# Patient Record
Sex: Female | Born: 1935 | Race: White | Hispanic: No | Smoking: Former smoker
Health system: Southern US, Community
[De-identification: ages and names within clinical notes are randomized; demographics above are authoritative.]

## PROBLEM LIST (undated history)

## (undated) DIAGNOSIS — G629 Polyneuropathy, unspecified: Secondary | ICD-10-CM

## (undated) DIAGNOSIS — K2270 Barrett's esophagus without dysplasia: Secondary | ICD-10-CM

## (undated) DIAGNOSIS — I2510 Atherosclerotic heart disease of native coronary artery without angina pectoris: Secondary | ICD-10-CM

## (undated) DIAGNOSIS — W010XXA Fall on same level from slipping, tripping and stumbling without subsequent striking against object, initial encounter: Secondary | ICD-10-CM

## (undated) DIAGNOSIS — E785 Hyperlipidemia, unspecified: Secondary | ICD-10-CM

## (undated) DIAGNOSIS — I209 Angina pectoris, unspecified: Secondary | ICD-10-CM

## (undated) DIAGNOSIS — Z8673 Personal history of transient ischemic attack (TIA), and cerebral infarction without residual deficits: Secondary | ICD-10-CM

## (undated) DIAGNOSIS — I82519 Chronic embolism and thrombosis of unspecified femoral vein: Secondary | ICD-10-CM

## (undated) DIAGNOSIS — M1990 Unspecified osteoarthritis, unspecified site: Secondary | ICD-10-CM

## (undated) DIAGNOSIS — M25511 Pain in right shoulder: Secondary | ICD-10-CM

## (undated) DIAGNOSIS — R600 Localized edema: Secondary | ICD-10-CM

## (undated) DIAGNOSIS — I10 Essential (primary) hypertension: Secondary | ICD-10-CM

## (undated) DIAGNOSIS — M810 Age-related osteoporosis without current pathological fracture: Secondary | ICD-10-CM

## (undated) DIAGNOSIS — Z9289 Personal history of other medical treatment: Secondary | ICD-10-CM

## (undated) DIAGNOSIS — Z9861 Coronary angioplasty status: Secondary | ICD-10-CM

## (undated) DIAGNOSIS — Z8669 Personal history of other diseases of the nervous system and sense organs: Secondary | ICD-10-CM

## (undated) DIAGNOSIS — M069 Rheumatoid arthritis, unspecified: Secondary | ICD-10-CM

## (undated) DIAGNOSIS — S72009A Fracture of unspecified part of neck of unspecified femur, initial encounter for closed fracture: Secondary | ICD-10-CM

## (undated) DIAGNOSIS — R0602 Shortness of breath: Secondary | ICD-10-CM

## (undated) DIAGNOSIS — I739 Peripheral vascular disease, unspecified: Secondary | ICD-10-CM

## (undated) DIAGNOSIS — M160 Bilateral primary osteoarthritis of hip: Secondary | ICD-10-CM

## (undated) DIAGNOSIS — J449 Chronic obstructive pulmonary disease, unspecified: Secondary | ICD-10-CM

## (undated) DIAGNOSIS — K449 Diaphragmatic hernia without obstruction or gangrene: Secondary | ICD-10-CM

## (undated) DIAGNOSIS — J189 Pneumonia, unspecified organism: Secondary | ICD-10-CM

## (undated) DIAGNOSIS — I700 Atherosclerosis of aorta: Secondary | ICD-10-CM

## (undated) DIAGNOSIS — D509 Iron deficiency anemia, unspecified: Secondary | ICD-10-CM

## (undated) DIAGNOSIS — K219 Gastro-esophageal reflux disease without esophagitis: Secondary | ICD-10-CM

## (undated) HISTORY — DX: Atherosclerosis of aorta: I70.0

## (undated) HISTORY — DX: Peripheral vascular disease, unspecified: I73.9

## (undated) HISTORY — DX: Bilateral primary osteoarthritis of hip: M16.0

## (undated) HISTORY — DX: Personal history of transient ischemic attack (TIA), and cerebral infarction without residual deficits: Z86.73

## (undated) HISTORY — PX: JOINT REPLACEMENT: SHX530

## (undated) HISTORY — DX: Hyperlipidemia, unspecified: E78.5

## (undated) HISTORY — DX: Gastro-esophageal reflux disease without esophagitis: K21.9

## (undated) HISTORY — DX: Atherosclerotic heart disease of native coronary artery without angina pectoris: I25.10

## (undated) HISTORY — DX: Fracture of unspecified part of neck of unspecified femur, initial encounter for closed fracture: S72.009A

## (undated) HISTORY — DX: Chronic embolism and thrombosis of unspecified femoral vein: I82.519

## (undated) HISTORY — DX: Localized edema: R60.0

## (undated) HISTORY — PX: OTHER SURGICAL HISTORY: SHX169

## (undated) HISTORY — DX: Iron deficiency anemia, unspecified: D50.9

## (undated) HISTORY — DX: Polyneuropathy, unspecified: G62.9

## (undated) HISTORY — DX: Personal history of other diseases of the nervous system and sense organs: Z86.69

## (undated) HISTORY — DX: Pneumonia, unspecified organism: J18.9

## (undated) HISTORY — DX: Unspecified osteoarthritis, unspecified site: M19.90

## (undated) HISTORY — PX: UPPER GASTROINTESTINAL ENDOSCOPY: SHX188

## (undated) HISTORY — DX: Chronic obstructive pulmonary disease, unspecified: J44.9

## (undated) HISTORY — DX: Pain in right shoulder: M25.511

## (undated) HISTORY — DX: Age-related osteoporosis without current pathological fracture: M81.0

## (undated) HISTORY — DX: Barrett's esophagus without dysplasia: K22.70

## (undated) HISTORY — DX: Coronary angioplasty status: Z98.61

## (undated) HISTORY — PX: COLONOSCOPY: SHX5424

## (undated) HISTORY — PX: CATARACT EXTRACTION W/ INTRAOCULAR LENS  IMPLANT, BILATERAL: SHX1307

## (undated) HISTORY — PX: SHOULDER OPEN ROTATOR CUFF REPAIR: SHX2407

## (undated) HISTORY — PX: CHOLECYSTECTOMY: SHX55

## (undated) HISTORY — DX: Diaphragmatic hernia without obstruction or gangrene: K44.9

## (undated) HISTORY — DX: Essential (primary) hypertension: I10

## (undated) HISTORY — PX: BACK SURGERY: SHX140

---
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
## 1963-02-21 HISTORY — PX: TOTAL ABDOMINAL HYSTERECTOMY: SHX209

## 1992-02-21 DIAGNOSIS — I739 Peripheral vascular disease, unspecified: Secondary | ICD-10-CM

## 1992-02-21 HISTORY — PX: FEMORAL-FEMORAL BYPASS GRAFT: SHX936

## 1992-02-21 HISTORY — DX: Peripheral vascular disease, unspecified: I73.9

## 1997-07-20 ENCOUNTER — Ambulatory Visit (HOSPITAL_COMMUNITY): Admission: RE | Admit: 1997-07-20 | Discharge: 1997-07-20 | Payer: Self-pay | Admitting: Vascular Surgery

## 1997-11-24 ENCOUNTER — Ambulatory Visit (HOSPITAL_COMMUNITY): Admission: RE | Admit: 1997-11-24 | Discharge: 1997-11-24 | Payer: Self-pay | Admitting: Obstetrics & Gynecology

## 1998-05-06 ENCOUNTER — Ambulatory Visit (HOSPITAL_BASED_OUTPATIENT_CLINIC_OR_DEPARTMENT_OTHER): Admission: RE | Admit: 1998-05-06 | Discharge: 1998-05-06 | Payer: Self-pay | Admitting: Orthopedic Surgery

## 1998-05-14 ENCOUNTER — Encounter: Admission: RE | Admit: 1998-05-14 | Discharge: 1998-07-06 | Payer: Self-pay | Admitting: Orthopedic Surgery

## 1998-08-07 ENCOUNTER — Ambulatory Visit (HOSPITAL_COMMUNITY): Admission: RE | Admit: 1998-08-07 | Discharge: 1998-08-07 | Payer: Self-pay | Admitting: Sports Medicine

## 1998-08-07 ENCOUNTER — Encounter: Payer: Self-pay | Admitting: Sports Medicine

## 1998-08-13 ENCOUNTER — Ambulatory Visit (HOSPITAL_BASED_OUTPATIENT_CLINIC_OR_DEPARTMENT_OTHER): Admission: RE | Admit: 1998-08-13 | Discharge: 1998-08-13 | Payer: Self-pay | Admitting: Orthopedic Surgery

## 1998-08-20 ENCOUNTER — Encounter: Admission: RE | Admit: 1998-08-20 | Discharge: 1998-09-16 | Payer: Self-pay | Admitting: Orthopedic Surgery

## 1998-09-17 ENCOUNTER — Encounter: Admission: RE | Admit: 1998-09-17 | Discharge: 1998-09-20 | Payer: Self-pay | Admitting: Orthopedic Surgery

## 1998-09-21 ENCOUNTER — Encounter: Admission: RE | Admit: 1998-09-21 | Discharge: 1998-09-29 | Payer: Self-pay | Admitting: Internal Medicine

## 1999-01-30 ENCOUNTER — Emergency Department (HOSPITAL_COMMUNITY): Admission: EM | Admit: 1999-01-30 | Discharge: 1999-01-30 | Payer: Self-pay

## 1999-03-23 ENCOUNTER — Encounter (INDEPENDENT_AMBULATORY_CARE_PROVIDER_SITE_OTHER): Payer: Self-pay | Admitting: Specialist

## 1999-03-23 ENCOUNTER — Ambulatory Visit (HOSPITAL_COMMUNITY): Admission: RE | Admit: 1999-03-23 | Discharge: 1999-03-23 | Payer: Self-pay | Admitting: *Deleted

## 1999-03-23 ENCOUNTER — Encounter (INDEPENDENT_AMBULATORY_CARE_PROVIDER_SITE_OTHER): Payer: Self-pay | Admitting: *Deleted

## 1999-05-08 ENCOUNTER — Encounter: Payer: Self-pay | Admitting: Emergency Medicine

## 1999-05-08 ENCOUNTER — Inpatient Hospital Stay (HOSPITAL_COMMUNITY): Admission: EM | Admit: 1999-05-08 | Discharge: 1999-05-09 | Payer: Self-pay | Admitting: Emergency Medicine

## 1999-06-08 ENCOUNTER — Encounter: Payer: Self-pay | Admitting: Internal Medicine

## 1999-06-08 ENCOUNTER — Ambulatory Visit (HOSPITAL_COMMUNITY): Admission: RE | Admit: 1999-06-08 | Discharge: 1999-06-08 | Payer: Self-pay | Admitting: *Deleted

## 1999-08-07 ENCOUNTER — Emergency Department (HOSPITAL_COMMUNITY): Admission: EM | Admit: 1999-08-07 | Discharge: 1999-08-07 | Payer: Self-pay | Admitting: *Deleted

## 1999-08-07 ENCOUNTER — Encounter: Payer: Self-pay | Admitting: Emergency Medicine

## 1999-10-21 ENCOUNTER — Encounter: Payer: Self-pay | Admitting: Internal Medicine

## 1999-10-21 ENCOUNTER — Ambulatory Visit (HOSPITAL_COMMUNITY): Admission: RE | Admit: 1999-10-21 | Discharge: 1999-10-21 | Payer: Self-pay | Admitting: Internal Medicine

## 1999-10-25 ENCOUNTER — Encounter: Admission: RE | Admit: 1999-10-25 | Discharge: 1999-11-15 | Payer: Self-pay | Admitting: Sports Medicine

## 1999-11-03 ENCOUNTER — Encounter: Payer: Self-pay | Admitting: Family Medicine

## 1999-11-03 ENCOUNTER — Ambulatory Visit (HOSPITAL_COMMUNITY): Admission: RE | Admit: 1999-11-03 | Discharge: 1999-11-03 | Payer: Self-pay | Admitting: Family Medicine

## 2000-03-19 ENCOUNTER — Ambulatory Visit (HOSPITAL_COMMUNITY): Admission: RE | Admit: 2000-03-19 | Discharge: 2000-03-19 | Payer: Self-pay | Admitting: *Deleted

## 2000-03-19 ENCOUNTER — Encounter (INDEPENDENT_AMBULATORY_CARE_PROVIDER_SITE_OTHER): Payer: Self-pay | Admitting: Specialist

## 2000-03-19 ENCOUNTER — Encounter (INDEPENDENT_AMBULATORY_CARE_PROVIDER_SITE_OTHER): Payer: Self-pay | Admitting: *Deleted

## 2000-07-04 ENCOUNTER — Encounter: Payer: Self-pay | Admitting: Internal Medicine

## 2000-07-04 ENCOUNTER — Ambulatory Visit (HOSPITAL_COMMUNITY): Admission: RE | Admit: 2000-07-04 | Discharge: 2000-07-04 | Payer: Self-pay | Admitting: Internal Medicine

## 2000-07-10 ENCOUNTER — Ambulatory Visit (HOSPITAL_COMMUNITY): Admission: RE | Admit: 2000-07-10 | Discharge: 2000-07-10 | Payer: Self-pay | Admitting: Internal Medicine

## 2000-07-10 ENCOUNTER — Encounter: Payer: Self-pay | Admitting: Internal Medicine

## 2000-08-10 ENCOUNTER — Ambulatory Visit (HOSPITAL_COMMUNITY): Admission: RE | Admit: 2000-08-10 | Discharge: 2000-08-10 | Payer: Self-pay | Admitting: Internal Medicine

## 2000-08-10 ENCOUNTER — Encounter: Payer: Self-pay | Admitting: Vascular Surgery

## 2000-08-10 ENCOUNTER — Encounter: Payer: Self-pay | Admitting: Internal Medicine

## 2000-08-14 ENCOUNTER — Ambulatory Visit (HOSPITAL_COMMUNITY): Admission: RE | Admit: 2000-08-14 | Discharge: 2000-08-15 | Payer: Self-pay | Admitting: Vascular Surgery

## 2000-08-30 ENCOUNTER — Encounter: Admission: RE | Admit: 2000-08-30 | Discharge: 2000-08-30 | Payer: Self-pay | Admitting: Vascular Surgery

## 2000-08-30 ENCOUNTER — Encounter: Payer: Self-pay | Admitting: Vascular Surgery

## 2000-10-03 ENCOUNTER — Encounter (INDEPENDENT_AMBULATORY_CARE_PROVIDER_SITE_OTHER): Payer: Self-pay | Admitting: *Deleted

## 2000-10-03 ENCOUNTER — Encounter: Admission: RE | Admit: 2000-10-03 | Discharge: 2000-10-03 | Payer: Self-pay | Admitting: *Deleted

## 2000-10-03 ENCOUNTER — Encounter: Payer: Self-pay | Admitting: *Deleted

## 2000-12-27 ENCOUNTER — Encounter: Payer: Self-pay | Admitting: *Deleted

## 2000-12-27 ENCOUNTER — Ambulatory Visit (HOSPITAL_COMMUNITY): Admission: RE | Admit: 2000-12-27 | Discharge: 2000-12-27 | Payer: Self-pay | Admitting: *Deleted

## 2000-12-27 ENCOUNTER — Encounter: Payer: Self-pay | Admitting: Internal Medicine

## 2001-04-09 ENCOUNTER — Encounter: Admission: RE | Admit: 2001-04-09 | Discharge: 2001-04-09 | Payer: Self-pay | Admitting: Rheumatology

## 2001-04-09 ENCOUNTER — Encounter: Payer: Self-pay | Admitting: Rheumatology

## 2001-09-13 ENCOUNTER — Emergency Department (HOSPITAL_COMMUNITY): Admission: EM | Admit: 2001-09-13 | Discharge: 2001-09-13 | Payer: Self-pay | Admitting: Emergency Medicine

## 2001-09-13 ENCOUNTER — Encounter: Payer: Self-pay | Admitting: Emergency Medicine

## 2002-02-20 DIAGNOSIS — I2510 Atherosclerotic heart disease of native coronary artery without angina pectoris: Secondary | ICD-10-CM

## 2002-02-20 HISTORY — PX: CORONARY ANGIOPLASTY WITH STENT PLACEMENT: SHX49

## 2002-02-20 HISTORY — PX: ABDOMINAL AORTIC ANEURYSM REPAIR: SUR1152

## 2002-02-20 HISTORY — DX: Atherosclerotic heart disease of native coronary artery without angina pectoris: I25.10

## 2002-03-21 ENCOUNTER — Encounter: Payer: Self-pay | Admitting: Vascular Surgery

## 2002-03-25 ENCOUNTER — Ambulatory Visit (HOSPITAL_COMMUNITY): Admission: RE | Admit: 2002-03-25 | Discharge: 2002-03-25 | Payer: Self-pay | Admitting: Vascular Surgery

## 2002-05-14 ENCOUNTER — Encounter: Payer: Self-pay | Admitting: Internal Medicine

## 2002-05-14 ENCOUNTER — Ambulatory Visit (HOSPITAL_COMMUNITY): Admission: RE | Admit: 2002-05-14 | Discharge: 2002-05-14 | Payer: Self-pay | Admitting: *Deleted

## 2002-05-14 ENCOUNTER — Encounter (INDEPENDENT_AMBULATORY_CARE_PROVIDER_SITE_OTHER): Payer: Self-pay | Admitting: Specialist

## 2002-05-14 ENCOUNTER — Encounter (INDEPENDENT_AMBULATORY_CARE_PROVIDER_SITE_OTHER): Payer: Self-pay | Admitting: *Deleted

## 2002-09-29 ENCOUNTER — Encounter: Payer: Self-pay | Admitting: Cardiology

## 2002-09-29 ENCOUNTER — Encounter: Admission: RE | Admit: 2002-09-29 | Discharge: 2002-09-29 | Payer: Self-pay | Admitting: Cardiology

## 2002-10-06 ENCOUNTER — Ambulatory Visit (HOSPITAL_COMMUNITY): Admission: RE | Admit: 2002-10-06 | Discharge: 2002-10-07 | Payer: Self-pay | Admitting: Cardiology

## 2002-10-07 ENCOUNTER — Inpatient Hospital Stay (HOSPITAL_COMMUNITY): Admission: EM | Admit: 2002-10-07 | Discharge: 2002-10-10 | Payer: Self-pay | Admitting: Emergency Medicine

## 2002-12-15 DIAGNOSIS — I700 Atherosclerosis of aorta: Secondary | ICD-10-CM | POA: Insufficient documentation

## 2003-01-19 ENCOUNTER — Inpatient Hospital Stay (HOSPITAL_COMMUNITY): Admission: RE | Admit: 2003-01-19 | Discharge: 2003-01-31 | Payer: Self-pay | Admitting: Vascular Surgery

## 2003-11-21 HISTORY — PX: CARDIAC CATHETERIZATION: SHX172

## 2003-12-03 ENCOUNTER — Encounter: Admission: RE | Admit: 2003-12-03 | Discharge: 2003-12-03 | Payer: Self-pay | Admitting: Cardiology

## 2003-12-09 ENCOUNTER — Ambulatory Visit (HOSPITAL_COMMUNITY): Admission: RE | Admit: 2003-12-09 | Discharge: 2003-12-09 | Payer: Self-pay | Admitting: Cardiology

## 2003-12-17 ENCOUNTER — Emergency Department (HOSPITAL_COMMUNITY): Admission: EM | Admit: 2003-12-17 | Discharge: 2003-12-17 | Payer: Self-pay | Admitting: Emergency Medicine

## 2003-12-21 ENCOUNTER — Ambulatory Visit (HOSPITAL_COMMUNITY): Admission: RE | Admit: 2003-12-21 | Discharge: 2003-12-21 | Payer: Self-pay | Admitting: Cardiology

## 2004-02-25 ENCOUNTER — Observation Stay (HOSPITAL_COMMUNITY): Admission: EM | Admit: 2004-02-25 | Discharge: 2004-02-26 | Payer: Self-pay | Admitting: *Deleted

## 2004-03-29 ENCOUNTER — Encounter (INDEPENDENT_AMBULATORY_CARE_PROVIDER_SITE_OTHER): Payer: Self-pay | Admitting: *Deleted

## 2004-03-29 ENCOUNTER — Encounter: Admission: RE | Admit: 2004-03-29 | Discharge: 2004-03-29 | Payer: Self-pay | Admitting: Internal Medicine

## 2004-05-09 ENCOUNTER — Encounter (INDEPENDENT_AMBULATORY_CARE_PROVIDER_SITE_OTHER): Payer: Self-pay | Admitting: *Deleted

## 2004-05-09 ENCOUNTER — Ambulatory Visit (HOSPITAL_COMMUNITY): Admission: RE | Admit: 2004-05-09 | Discharge: 2004-05-09 | Payer: Self-pay | Admitting: *Deleted

## 2004-09-27 ENCOUNTER — Ambulatory Visit (HOSPITAL_COMMUNITY): Admission: RE | Admit: 2004-09-27 | Discharge: 2004-09-28 | Payer: Self-pay | Admitting: *Deleted

## 2004-09-27 ENCOUNTER — Encounter (INDEPENDENT_AMBULATORY_CARE_PROVIDER_SITE_OTHER): Payer: Self-pay | Admitting: *Deleted

## 2004-09-27 ENCOUNTER — Encounter (INDEPENDENT_AMBULATORY_CARE_PROVIDER_SITE_OTHER): Payer: Self-pay | Admitting: Specialist

## 2005-01-10 ENCOUNTER — Encounter: Admission: RE | Admit: 2005-01-10 | Discharge: 2005-01-10 | Payer: Self-pay | Admitting: Internal Medicine

## 2005-01-20 ENCOUNTER — Encounter (INDEPENDENT_AMBULATORY_CARE_PROVIDER_SITE_OTHER): Payer: Self-pay | Admitting: *Deleted

## 2005-01-20 ENCOUNTER — Ambulatory Visit (HOSPITAL_COMMUNITY): Admission: RE | Admit: 2005-01-20 | Discharge: 2005-01-20 | Payer: Self-pay | Admitting: *Deleted

## 2005-02-15 ENCOUNTER — Encounter: Admission: RE | Admit: 2005-02-15 | Discharge: 2005-02-15 | Payer: Self-pay | Admitting: Cardiology

## 2005-08-02 ENCOUNTER — Encounter: Admission: RE | Admit: 2005-08-02 | Discharge: 2005-08-02 | Payer: Self-pay | Admitting: Sports Medicine

## 2005-08-24 ENCOUNTER — Encounter: Admission: RE | Admit: 2005-08-24 | Discharge: 2005-08-24 | Payer: Self-pay | Admitting: Sports Medicine

## 2005-10-30 ENCOUNTER — Encounter: Admission: RE | Admit: 2005-10-30 | Discharge: 2005-10-30 | Payer: Self-pay | Admitting: Sports Medicine

## 2006-02-28 ENCOUNTER — Encounter: Admission: RE | Admit: 2006-02-28 | Discharge: 2006-02-28 | Payer: Self-pay | Admitting: Sports Medicine

## 2006-03-13 ENCOUNTER — Encounter (INDEPENDENT_AMBULATORY_CARE_PROVIDER_SITE_OTHER): Payer: Self-pay | Admitting: *Deleted

## 2006-03-13 ENCOUNTER — Ambulatory Visit (HOSPITAL_COMMUNITY): Admission: RE | Admit: 2006-03-13 | Discharge: 2006-03-13 | Payer: Self-pay | Admitting: *Deleted

## 2006-03-13 ENCOUNTER — Encounter (INDEPENDENT_AMBULATORY_CARE_PROVIDER_SITE_OTHER): Payer: Self-pay | Admitting: Specialist

## 2006-11-13 ENCOUNTER — Emergency Department (HOSPITAL_COMMUNITY): Admission: EM | Admit: 2006-11-13 | Discharge: 2006-11-14 | Payer: Self-pay | Admitting: Emergency Medicine

## 2008-02-05 ENCOUNTER — Encounter: Admission: RE | Admit: 2008-02-05 | Discharge: 2008-02-05 | Payer: Self-pay | Admitting: Sports Medicine

## 2008-02-20 ENCOUNTER — Encounter: Admission: RE | Admit: 2008-02-20 | Discharge: 2008-02-20 | Payer: Self-pay | Admitting: Sports Medicine

## 2008-03-06 ENCOUNTER — Encounter: Admission: RE | Admit: 2008-03-06 | Discharge: 2008-03-06 | Payer: Self-pay | Admitting: Sports Medicine

## 2008-03-16 ENCOUNTER — Encounter: Admission: RE | Admit: 2008-03-16 | Discharge: 2008-03-16 | Payer: Self-pay | Admitting: Sports Medicine

## 2008-04-13 ENCOUNTER — Encounter (INDEPENDENT_AMBULATORY_CARE_PROVIDER_SITE_OTHER): Payer: Self-pay | Admitting: *Deleted

## 2008-04-13 ENCOUNTER — Ambulatory Visit (HOSPITAL_COMMUNITY): Admission: RE | Admit: 2008-04-13 | Discharge: 2008-04-13 | Payer: Self-pay | Admitting: *Deleted

## 2008-11-03 ENCOUNTER — Encounter: Admission: RE | Admit: 2008-11-03 | Discharge: 2008-11-03 | Payer: Self-pay | Admitting: Sports Medicine

## 2009-01-05 ENCOUNTER — Encounter: Payer: Self-pay | Admitting: Internal Medicine

## 2009-02-20 HISTORY — PX: TOTAL SHOULDER REPLACEMENT: SUR1217

## 2009-04-15 ENCOUNTER — Encounter: Admission: RE | Admit: 2009-04-15 | Discharge: 2009-04-15 | Payer: Self-pay | Admitting: Sports Medicine

## 2009-06-28 ENCOUNTER — Encounter (INDEPENDENT_AMBULATORY_CARE_PROVIDER_SITE_OTHER): Payer: Self-pay | Admitting: *Deleted

## 2009-07-05 ENCOUNTER — Encounter: Payer: Self-pay | Admitting: Internal Medicine

## 2009-07-22 ENCOUNTER — Encounter: Admission: RE | Admit: 2009-07-22 | Discharge: 2009-07-22 | Payer: Self-pay | Admitting: Sports Medicine

## 2009-07-26 ENCOUNTER — Ambulatory Visit: Payer: Self-pay | Admitting: Internal Medicine

## 2009-07-26 DIAGNOSIS — R490 Dysphonia: Secondary | ICD-10-CM

## 2009-07-26 DIAGNOSIS — K2270 Barrett's esophagus without dysplasia: Secondary | ICD-10-CM

## 2009-08-10 ENCOUNTER — Encounter: Payer: Self-pay | Admitting: Internal Medicine

## 2009-11-10 ENCOUNTER — Inpatient Hospital Stay (HOSPITAL_COMMUNITY): Admission: RE | Admit: 2009-11-10 | Discharge: 2009-11-11 | Payer: Self-pay | Admitting: Orthopedic Surgery

## 2010-03-22 NOTE — Procedures (Signed)
Summary: [facility]  [facility]   Imported By: Lester Bunkie 08/26/2009 11:02:32  _____________________________________________________________________  External Attachment:    Type:   Image     Comment:   External Document

## 2010-03-22 NOTE — Op Note (Signed)
Summary: EGD-Dr. Debbra Riding [facility]  Patient:    [name], [name]                       MRN: [id number] Adm. Date:  [id number] Attending:  Sabino Gasser                           Procedure Report  PROCEDURE:  Upper endoscopy.  ENDOSCOPIST:  Sabino Gasser, M.D.  INDICATIONS:  Reflux symptomatology, ? Barretts esophagus.  ANESTHESIA:  Demerol 50 mg, Versed 5 mg.  DESCRIPTION OF PROCEDURE:  With patient mildly sedated in the left lateral decubitus position, the Olympus videoscopic endoscope was inserted in the mouth and passed under direct vision through the esophagus.  Distal esophagus was approached and a hiatal hernia was seen.  There was a questionable area of short-segment Barretts esophagus; photograph was taken.  Biopsies were taken of this area.  Subsequently, we entered into the stomach.  Fundus, body and antrum were visualized and photographs taken.  There was some mild erythema right around the pyloric area; this was biopsied.  We entered into the duodenal bulb and second portion of duodenum, both of which appeared normal. From this point, the endoscope was slowly withdrawn, taking circumferential views of the entire duodenal mucosa, till the endoscope had been pulled back into the stomach and placed in retroflexion to view the stomach from below and a hiatal hernia was seen and photographed.  The endoscope was then straightened and withdrawn.  Patients vital signs and pulse oximetry remained stable.  Patient tolerated the procedure well without apparent complications.  FINDINGS:  Question of short-segment Barretts esophagus above the hiatal hernia, biopsied; await biopsy report.  Mild erythema of the prepyloric area, await biopsy report.  PLAN:  Patient will call me for results and follow up with me as an outpatient. DD:  03/19/00 TD:  03/19/00 Job: [id number]

## 2010-03-22 NOTE — Op Note (Signed)
Summary: EGD: Dr. Virginia Rochester    NAME:  [name], [name]                          ACCOUNT NO.:  [id number]   MEDICAL RECORD NO.:  [id number]                   PATIENT TYPE:  AMB   LOCATION:  ENDO                                 FACILITY:  [facility]   PHYSICIAN:  Georgiana Spinner, M.D.                 DATE OF BIRTH:  [birth year]   DATE OF PROCEDURE:  DATE OF DISCHARGE:                                 OPERATIVE REPORT   PROCEDURE:  Upper endoscopy.   INDICATIONS FOR PROCEDURE:  Barrett's esophagus.   ANESTHESIA:  Fentanyl 37.5 mcg, Versed 4 mg.   DESCRIPTION OF PROCEDURE:  With the patient mildly sedated in the left  lateral decubitus position, the Olympus videoscopic endoscope was inserted  in the mouth and passed under direct vision through the esophagus into a  hiatal hernia sac which appeared normal and the fundus of the stomach was  also viewed and appeared normal as did the body of the stomach. The antrum  showed some irregularity of the prepyloric area which was photographed and  biopsied. We entered into the duodenal bulb and second portion of the  duodenum appeared normal.  From this point, the endoscope was slowly  withdrawn taking circumferential views of the entire duodenal mucosa until  the endoscope was then pulled back in the stomach, placed in retroflexion to  view the stomach from below and the hiatal hernia sac was visualized from  below and photographed. The endoscope was then straightened and withdrawn  taking circumferential views of the remaining gastric and esophageal mucosa  stopping in the distal esophagus. The biopsy of the Z line at the  gastroesophageal junction clear evidence of Barrett's was difficult to see  because of the hiatal hernia.  The patient's vital signs and pulse oximeter  remained stable. The patient tolerated the procedure well without apparent  complications.   FINDINGS:  Hiatal hernia with biopsy of the demarcation line between the  stomach and  esophagus.   PLAN:  Await biopsy report. The patient is known to have Barrett's esophagus  and will have patient call me for results of biopsy and followup with me as  an outpatient.                                                 Georgiana Spinner, M.D.    GMO/MEDQ  D:  05/14/2002  T:  05/14/2002  Job:  [id number]   cc:   [name], M.D.  [address]  [address]  [place]  [place] [address]  Fax: [phone]

## 2010-03-22 NOTE — Op Note (Signed)
Summary: EGD-Dr. Virginia Rochester  NAMEAQUARIUS, TREMPER NO.:  [id number]   MEDICAL RECORD NO.:  [id number]          PATIENT TYPE:  AMB   LOCATION:  ENDO                         [facility]ACILITY:  [facility]   PHYSICIAN:  Georgiana Spinner, M.D.    DATE OF BIRTH:  04-Mar-1930   DATE OF PROCEDURE:  05/09/2004  DATE OF DISCHARGE:                                 OPERATIVE REPORT   PROCEDURE:  Upper endoscopy.   INDICATIONS:  GERD.   ANESTHESIA:  Demerol 30, Versed 3 mg.   PROCEDURE:  With the patient mildly sedated in the left lateral decubitus  position, the Olympus videoscopic endoscope was inserted in the mouth,  passed under direct vision through the esophagus which appeared normal.  We  passed through a hiatal hernia into the stomach.  Fundus, body, antrum,  duodenal bulb, second portion duodenum were visualized.  From this point,  the endoscope was slowly withdrawn, taking circumferential views of duodenal  mucosa until the endoscope had been pulled back in the stomach, placed in  retroflexion to view the stomach from below, and biopsy of an erythematous  area at the squamocolumnar junction was taken.  The endoscope was then  withdrawn, taking circumferential views of remaining gastric and esophageal  mucosa.  The patient's vital signs, pulse oximeter remained stable.  The  patient tolerated procedure well without complications.   FINDINGS:  Large hiatal hernia with biopsy of squamocolumnar junction area.  Await biopsy report.  The patient will call me for results and follow-up  with me as an outpatient.  Proceed to colonoscopy as planned.      GMO/MEDQ  D:  05/09/2004  T:  05/09/2004  Job:  [id number]

## 2010-03-22 NOTE — Op Note (Signed)
Summary: EGD-Dr. Virginia Rochester  [name], SARRA NO.:  [id number]      MEDICAL RECORD NO.:  [id number]          PATIENT TYPE:  AMB      LOCATION:  ENDO                         [facility]ACILITY:  [facility]      PHYSICIAN:  Georgiana Spinner, M.D.    DATE OF BIRTH:  23-Mar-1930      DATE OF PROCEDURE:   DATE OF DISCHARGE:                                  OPERATIVE REPORT      PROCEDURE:  Upper endoscopy.      INDICATIONS:  GERD with Barrett esophagus.      ANESTHESIA:  Fentanyl 30 mcg, Versed 2 mg.      PROCEDURE:  With the patient mildly sedated in the left lateral   decubitus position, the Pentax videoscopic endoscope was inserted in the   mouth and passed under direct vision through the esophagus, which   appeared normal on direct view, into stomach via hiatal hernia.  Fundus,   body, antrum, duodenal bulb, second portion of duodenum were visualized.   From this point, the endoscope was slowly withdrawn, taking   circumferential views of duodenal mucosa until the endoscope then pulled   back into stomach, placed in retroflexion to view the stomach from   below.  An area of Barrett's was seen, photographed and biopsied.  The   endoscope was straightened and withdrawn.  The patient's vital signs and   pulse oximeter remained stable.  The patient tolerated the procedure   well without apparent complications.      FINDINGS:  A small area of Barrett esophagus above the hiatal hernia   biopsied.  Await biopsy report.  The patient will call me for results   and follow up with me as an outpatient.                  ______________________________   Georgiana Spinner, M.D.            GMO/MEDQ  D:  04/13/2008  T:  04/13/2008  Job:  [id number]

## 2010-03-22 NOTE — Letter (Signed)
Summary: [facility]  [facility]   Imported By: Sherian Rein 07/29/2009 14:59:20  _____________________________________________________________________  External Attachment:    Type:   Image     Comment:   External Document

## 2010-03-22 NOTE — Op Note (Signed)
Summary: EGD w/Sav Dil: Dr. Debbra Riding [facility]  Patient:    [name], [name] Visit Number: [id number] MRN: [id number]          Service Type: END Location: ENDO Attending Physician:  Sabino Gasser Dictated by:   Sabino Gasser, M.D. Admit Date:  12/27/2000 Discharge Date: 12/27/2000   CC:         Kern Reap, M.D.  Keturah Barre, M.D.   Operative Report  PROCEDURE:  Upper endoscopy with Savary dilation.  INDICATIONS:  Dysphagia.  The patient states that she is somewhat better, however, since she has been on medication.  ANESTHESIA:  Demerol 60, Versed 6 mg.  DESCRIPTION OF PROCEDURE:  With the patient mildly sedated in Room #2 of radiology, in the left lateral decubitus position, the Olympus videoscopic endoscope was inserted in the mouth, passed under direct vision through the esophagus which appeared grossly normal.  We entered into the stomach through a large hiatal hernia.  Fundus, body, antrum, duodenal bulb, second portion of the duodenum all visualized and appeared normal.  Photographs taken.  From this point, the endoscope was slowly withdrawn, taking circumferential views of the entire duodenum mucosa.  The endoscope was pulled back into the stomach and placed in retroflex to view the stomach from below.  The endoscope was then straightened and the guide wire was passed.  The endoscope was removed taking circumferential views of the remaining gastric and esophageal mucosa. Subsequently a Savary 15 and 18 dilators were easily passed with no resistance.  With the latter, the guide wire was removed.  The endoscope was reinserted and once again no abnormalities noted.  There was no bleeding.  The endoscope was withdrawn.  The patients vital signs remained stable.  The patient tolerated the procedure well without apparent complications.  FINDINGS:  Large hiatal hernia; otherwise an unremarkable examination at this time; question possibly  decreased peristalsis.  Will await clinical response. The patient will follow up with me as an outpatient and evaluate further depending upon her clinical response and continued response to the medication. ictated by:   Sabino Gasser, M.D. Attending Physician:  Sabino Gasser DD:  12/27/00 TD:  12/28/00 Job: [id number]

## 2010-03-22 NOTE — Op Note (Signed)
Summary: EGD-Dr.Orr  [name], [name] NO.:  [id number]   MEDICAL RECORD NO.:  [id number]          PATIENT TYPE:  AMB   LOCATION:  ENDO                         FACILITY:  [facility]   PHYSICIAN:  Georgiana Spinner, M.D.    DATE OF BIRTH:  1930-03-01   DATE OF PROCEDURE:  01/20/2005  DATE OF DISCHARGE:                                 OPERATIVE REPORT   PROCEDURE:  Upper endoscopy with dilation.   INDICATIONS:  Dysphagia for solid foods.   ANESTHESIA:  Demerol 50, Versed 5 mg   PROCEDURE:  With patient mildly sedated in the left lateral decubitus  position, the Olympus videoscopic endoscope was inserted in the mouth,  passed under direct vision through the esophagus which appeared normal into  the stomach, through a hiatal hernia.  Fundus, body, antrum, duodenal bulb,  second portion of duodenum were visualized.  From this point, the endoscope  was slowly withdrawn, taking circumferential views of duodenal mucosa until  the endoscope had been pulled back in the stomach and placed in retroflexion  to view the stomach from below.  The endoscope was then straightened; the  guidewire was passed; the endoscope was withdrawn, taking circumferential  views of remaining gastric and esophageal mucosa.  Subsequently, Savary  dilators 15 and 17 mm were passed rather easily without blood seen on either  dilator.  With the latter, the guidewire was removed.  The endoscope was  reinserted into the stomach and withdrawn taking circumferential views once  again of the remaining gastric and esophageal mucosa.  The patient's vital  signs and pulse oximeter remained stable.  The patient tolerated the  procedure well without apparent complications.   FINDINGS:  Unremarkable examination with hiatal hernia.  Esophagus dilated  to 15 and 17 Savary dilation.  Await clinical response.  The patient will  follow up with me as an outpatient.           ______________________________  Georgiana Spinner, M.D.     GMO/MEDQ  D:  01/20/2005  T:  01/20/2005  Job:  [id number]   cc:   [name], M.D.  Fax: [phone]

## 2010-03-22 NOTE — Op Note (Signed)
Summary: EGD-Dr. Virginia Rochester  NAMEFORTUNE, [name]                ACCOUNT NO.:  [id number]   MEDICAL RECORD NO.:  [id number]          PATIENT TYPE:  AMB   LOCATION:  DAY                          FACILITY:  [facility]   PHYSICIAN:  Vikki Ports, MDDATE OF BIRTH:  12/02/1930   DATE OF PROCEDURE:  09/27/2004  DATE OF DISCHARGE:                                 OPERATIVE REPORT   PREOPERATIVE DIAGNOSIS:  Symptomatic cholelithiasis   POSTOPERATIVE DIAGNOSIS:  Symptomatic cholelithiasis, no evidence of ventral  hernia.   PROCEDURE:  Diagnostic laparoscopy, lysis of adhesions and laparoscopic  cholecystectomy.   SURGEON:  Danna Hefty, M.D.   ASSISTANT:  Lebron Conners, M.D.   ANESTHESIA:  General.   DESCRIPTION OF PROCEDURE:  The patient was taken to the operating room,  placed in a supine position and after adequate general anesthesia was  induced using endotracheal tube, the abdomen was prepped and draped in the  normal sterile fashion. Using a and left upper quadrant 11 mm OptiView,  peritoneal access was obtained, pneumoperitoneum was obtained. With gentle  blunt dissection with the camera, a few filmy adhesions were taken down.  Under direct vision, a 5 mm trocar was placed in the left lower quadrant and  omental adhesions were taken down from the anterior abdominal wall. This was  inspected. There was no evidence of a ventral hernia. Additional 5 mm  trocars were then placed in the right abdomen, the gallbladder was  identified. It was retracted cephalad and laterally. Dissection at the  infundibulum down to the cystic duct was accomplished. The cystic duct and  the triangle of Calot was easily identified. The cystic duct was quite  small, 1-2 mm. Its junction with the gallbladder and common bile duct right  were verified, it was triply clipped and divided as was the cystic artery.  The gallbladder was taken off the gallbladder bed using Bovie  electrocautery, placed in an  EndoCatch bag and removed from the abdomen.  Adequate hemostasis was ensured. Trocars were removed and pneumoperitoneum  had been released. Incisions were closed with subcuticular 4-0 Monocryl.  Steri-Strips and sterile dressings were applied. The patient tolerated the  procedure well and went to PACU in good condition.       KRH/MEDQ  D:  09/27/2004  T:  09/27/2004  Job:  [id number]

## 2010-03-22 NOTE — Op Note (Signed)
Summary: Colonoscopy-Dr. Virginia Rochester  NAME:  [name], [name]                ACCOUNT NO.:  [id number]   MEDICAL RECORD NO.:  [id number]          PATIENT TYPE:  AMB   LOCATION:  ENDO                         FACILITY:  [facility]   PHYSICIAN:  Georgiana Spinner, M.D.    DATE OF BIRTH:  April 09, 1930   DATE OF PROCEDURE:  05/09/2004  DATE OF DISCHARGE:                                 OPERATIVE REPORT   PROCEDURE:  Colonoscopy.   ANESTHESIA:  Demerol 15, Versed 1.5 mg.   PROCEDURE:  With the patient mildly sedated in the left lateral decubitus  position and subsequently rolled to her back, the Olympus videoscopic  colonoscope was inserted in the rectum and passed under direct vision to the  cecum ,identified by ileocecal valve and appendiceal orifice, both of which  were photographed. From this point, the colonoscope was slowly withdrawn  taking circumferential views of colonic mucosa, stopping in the rectum which  appeared normal on direct and showed hemorrhoids on retroflexed view. The  endoscope was straightened and withdrawn. The patient's vital signs and  pulse oximeter remained stable. The patient tolerated procedure well without  complications.   FINDINGS:  Internal hemorrhoids. Otherwise an unremarkable colonoscopic  examination.   PLAN:  Consider repeat examination possibly in 5 years.      GMO/MEDQ  D:  05/09/2004  T:  05/09/2004  Job:  [id number]

## 2010-03-22 NOTE — Assessment & Plan Note (Signed)
Summary: needs to establish GI Dr.--ch.   History of Present Illness Visit Type: new patient  Primary GI MD: Stan Head MD [facility] Primary Provider: Lennon Alstrom. Felipa Eth, MD  Requesting Provider: na Chief Complaint: GERD  History of Present Illness:   77 yo white woman with Barrett's esophagus and GERD. She was diagnosed by Dr. Virginia Rochester in 2001. She has had several screening/surveillance EGD's as outlined below, no dysplasia ever. She has had Savary dilation at least twice for dysphagia. She has no symptoms of GERD on Prilosec. She is aware of Barrett's esophagus and possible cancer. She  has had hoarseness for 1 year. Denies sinus trouble or post-nasal drip and no cough. She did not discuss this with Dr. Felipa Eth at recent exam.  She had been on Protonix, and switched to Prilosec OTC in 2009 or so due to cost issue.  Dr. Vicente Males notes, [facility]r's notes, EGD, colonoscpy and patholgy reviewed. Photos of last 2010 EGD reviewed with patient also.   GI Review of Systems    Reports acid reflux and  heartburn.      Denies abdominal pain, belching, bloating, chest pain, dysphagia with liquids, dysphagia with solids, loss of appetite, nausea, vomiting, vomiting blood, weight loss, and  weight gain.        Denies anal fissure, black tarry stools, change in bowel habit, constipation, diarrhea, diverticulosis, fecal incontinence, heme positive stool, hemorrhoids, irritable bowel syndrome, jaundice, light color stool, liver problems, rectal bleeding, and  rectal pain.     Preventive Screening-Counseling & Management  Alcohol-Tobacco     Alcohol drinks/day: 0     Smoking Status: quit     Packs/Day: 1.0     Year Started: 1950     Year Quit: 2002     Pack years: 46  Caffeine-Diet-Exercise     Caffeine use/day: 0     Caffeine Counseling: not indicated; caffeine use is not excessive or problematic     Diet Counseling: not indicated; diet is assessed to be healthy     Does Patient Exercise: yes     Type of exercise:  mows yard      Drug Use:  no.     Current Medications (verified): 1)  Norvasc 5 Mg Tabs (Amlodipine Besylate) .[name].. 1 By Mouth Once Daily 2)  Aspirin 81 Mg Tabs (Aspirin) .[name].. 1 By Mouth Once Daily 3)  Prilosec Otc 20 Mg Tbec (Omeprazole Magnesium) .[name].. 1 By Mouth Once Daily 4)  Caltrate 600+d Plus 600-400 Mg-Unit Tabs (Calcium Carbonate-Vit D-Min) .[name].. 1 By Mouth Two Times A Day 5)  Vitamin D 1000 Unit Tabs (Cholecalciferol) .[name].. 1 By Mouth Two Times A Day 6)  Flax Seed Oil 1000 Mg Caps (Flaxseed (Linseed)) .... One Capsule By Mouth Once Daily 7)  Tramadol Hcl 50 Mg Tabs (Tramadol Hcl) .... As Needed 8)  Vitamin C 500 Mg Tabs (Ascorbic Acid) .... One Tablet By Mouth Once Daily 9)  Multivitamins   Tabs (Multiple Vitamin) .... One Tablet By Mouth Once Daily 10)  Centrum Silver  Tabs (Multiple Vitamins-Minerals) .... One Tablet By Mouth Once Daily 11)  Ra Iron 27 Mg Tabs (Ferrous Sulfate) .... One Tablet By Mouth Once Daily 12)  Hydroxychloroquine Sulfate 200 Mg Tabs (Hydroxychloroquine Sulfate) .... One Tablet By Mouth Two Times A Day  Allergies (verified): No Known Drug Allergies  Past History:  Past Medical History: Barretts Esophagus GERD Arthritis Hypertension DJD Osteoporosis CAD Hyperlipidemia  Past Surgical History: Back Surgery TAH Bilateral Rotator Cuff Cholecystectomy Open Heart Surgery--Stents  Family History: No FH of Colon Cancer: Family History of Prostate Cancer:Father  Family History of Diabetes: Mother   Social History: Retired Widowed One child-deceased Patient is a former smoker.  Alcohol Use - no Daily Caffeine Use: 2-3 cups coffee daily  Illicit Drug Use - no Smoking Status:  quit Drug Use:  no Packs/Day:  1.0 Pack years:  28 Alcohol drinks/day:  0 Caffeine use/day:  0 Does Patient Exercise:  yes  Vital Signs:  Patient profile:   77 year old female Height:      63 inches Weight:      139 pounds BMI:     24.71 BSA:     1.66 Pulse  rate:   68 / minute Pulse rhythm:   regular BP sitting:   132 / 76  (left arm) Cuff size:   regular  Vitals Entered By: Ok Anis CMA (July 26, 2009 1:54 PM)  Physical Exam  General:  elderly NAD Eyes:  PERRLA, no icterus. Mouth:  No deformity or lesions, dentition normal. Posterior pharynx clear Neck:  Supple; no masses or thyromegaly. Chest Wall:  kyphosis.   Lungs:  Clear throughout to auscultation. mildly polonged expiratory phase Heart:  Regular rate and rhythm; no murmurs, rubs,  or bruits. Abdomen:  Soft, nontender and nondistended. No masses, hepatosplenomegaly or hernias noted. Normal bowel sounds. Midline scar Extremities:  varicosities, lower exremities no edema Neurologic:  Alert and  oriented x3 Skin:  perpigmentation and hypopigmentation patches upper extremities  Cervical Nodes:  No significant cervical or supraclavicular adenopathy.  Psych:  Alert and cooperative. Normal mood and affect.   Impression & Recommendations:  Problem # 1:  BARRETTS ESOPHAGUS (ICD-530.85) Assessment New Very small area of intestinal metaplasia at/near z-line in a hiatal hernia sac. This could actually bne intestinal metaplasia of the cardia. Her cancer risk is not great and she has had multiple EGD's and biopsies without dysplais. Continue PPI and repeat EGD in 03/2011 depending upon health at that time.  Problem # 2:  HOARSENESS, CHRONIC (NGE-952.84) Assessment: New 1 year of hoarseness could be from GERD but I have recommended an otolaryngology evaluation to look for polyps or neoplasia involving larynx. She is reluctant to schhedule due to family visits and other family issues but told me she would go in August.  Patient Instructions: 1)  Please continue current medications.  2)  This office will arrange for an ENT evaluation for your hoaseness and notify you about that appointment. You have requested an August appointment. We can do that but suggest you do it sooner, if  possible. 3)  Your next upper endoscopy to follow-up Barrett's esophagus should be in February 2013. 4)  Copy sent to : R. Avva, MD 5)  The medication list was reviewed and reconciled.  All changed / newly prescribed medications were explained.  A complete medication list was provided to the patient / caregiver.

## 2010-03-22 NOTE — Op Note (Signed)
Summary: EGD-Dr. Virginia Rochester  NAMEJAMIL, [name] NO.:  [id number]   MEDICAL RECORD NO.:  [id number]          PATIENT TYPE:  AMB   LOCATION:  ENDO                         FACILITY:  [facility]   PHYSICIAN:  Georgiana Spinner, M.D.    DATE OF BIRTH:  [birth year]   DATE OF PROCEDURE:  03/13/2006  DATE OF DISCHARGE:                               OPERATIVE REPORT   PROCEDURE:  Upper endoscopy with Savary dilation and biopsy.   INDICATIONS:  Dysphagia with history of Barrett esophagus.   ANESTHESIA:  Fentanyl 62.5 mcg, Versed 5 mg.   PROCEDURE:  With patient mildly sedated in the left lateral decubitus  position, room 3 of endoscopy, the Pentax videoscopic endoscope was  inserted in the mouth, passed under direct vision through the esophagus,  which appeared normal until we reached the distal esophagus and there  was a questionable area of Barrett photographed.  We then entered into  the stomach through a hiatal hernia sac.  The fundus, body, antrum,  duodenal bulb, second portion of duodenum appeared normal.  From this  point, the endoscope was slowly withdrawn taking circumferential views  of the duodenal mucosa until the endoscope had been pulled back into the  stomach, placed in retroflexion and viewed the stomach from below.  The  endoscope was then straightened and a guide wire was passed.  The  endoscope was withdrawn and with fluoroscopic control a 17 Savary  dilator was passed rather easily over the guide wire.  After passage,  the guide wire was removed.  The endoscope was reinserted into the  stomach and biopsies were taken of the squamocolumnar junction.  The  endoscope was withdrawn.  The patient's vital signs and pulse oximeter  remained stable.  The patient tolerated the procedure well without  apparent complications.   FINDINGS:  Hiatal hernia with question of Barrett esophagus biopsied.  Dilation of distal esophagus to 17 Savary dilation.   PLAN:  Await  clinical response and biopsy report.  The patient will call  me for results and follow up with me as an outpatient.           ______________________________  Georgiana Spinner, M.D.     GMO/MEDQ  D:  03/13/2006  T:  03/13/2006  Job:  [id number]   cc:   [name], M.D.

## 2010-03-22 NOTE — Letter (Signed)
Summary: New Patient letter  [facility]  [address] [place], [place] [address]   Phone: [phone]  Fax: [phone]       06/28/2009 MRN: [id number]  [name] [address] [place], [place]  [address]  Dear [name],  Welcome to the Gastroenterology Division at [facility].    You are scheduled to see Dr.  Leone Payor on 07-26-09 at 1:45p.m. on the 3rd floor at [facility], [address]  We ask that you try to arrive at our office 15 minutes prior to your appointment time to allow for check-in.  We would like you to complete the enclosed self-administered evaluation form prior to your visit and bring it with you on the day of your appointment.  We will review it with you.  Also, please bring a complete list of all your medications or, if you prefer, bring the medication bottles and we will list them.  Please bring your insurance card so that we may make a copy of it.  If your insurance requires a referral to see a specialist, please bring your referral form from your primary care physician.  Co-payments are due at the time of your visit and may be paid by cash, check or credit card.     Your office visit will consist of a consult with your physician (includes a physical exam), any laboratory testing he/she may order, scheduling of any necessary diagnostic testing (e.g. x-ray, ultrasound, CT-scan), and scheduling of a procedure (e.g. Endoscopy, Colonoscopy) if required.  Please allow enough time on your schedule to allow for any/all of these possibilities.    If you cannot keep your appointment, please call [phone] to cancel or reschedule prior to your appointment date.  This allows [place] the opportunity to schedule an appointment for another patient in need of care.  If you do not cancel or reschedule by 5 p.m. the business day prior to your appointment date, you will be charged a $50.00 late cancellation/no-show fee.    Thank you for choosing  [facility] for your medical needs.  We appreciate the opportunity to care for you.  Please visit [place] at our website  to learn more about our practice.                     Sincerely,                                                             The Gastroenterology Division

## 2010-03-22 NOTE — Miscellaneous (Signed)
Summary: removal of incorrect obsterm on gastro flowsheet  Clinical Lists Changes  Observations: Removed observation of COLONOSCOPY: Hiatal hernia and Barrett's esophagus (05/14/2002 18:33) Removed observation of COLONOSCOPY: Hiatal hernia and Savary dilation of esohagus (12/27/2000 18:34) Removed observation of COLONOSCOPY: Short-segment Barrett's esophagus Hiatal hernia (03/19/2000 18:35)

## 2010-04-05 ENCOUNTER — Telehealth: Payer: Self-pay | Admitting: Internal Medicine

## 2010-04-13 NOTE — Progress Notes (Signed)
Summary: ? re egd  Phone Note Call from Patient Call back at Home Phone [phone]   Caller: Patient Call For: Dr Leone Payor Reason for Call: Talk to Nurse Summary of Call: Patient wants to know when is she going to be scheduled for an Egd, states that she saw Dr Carleene Overlie June and was told that he was going to mail her a letter telling her when she's do. Initial call taken by: Tawni Levy,  April 05, 2010 2:47 PM  Follow-up for Phone Call        Per office note from 07/26/09 patient is due for screening EGD in 03/2011 patient aware. Follow-up by: Darcey Nora RN, CGRN,  April 05, 2010 3:58 PM

## 2010-05-05 LAB — URINE CULTURE
Culture  Setup Time: 201109211544
Culture: NO GROWTH

## 2010-05-05 LAB — URINALYSIS, ROUTINE W REFLEX MICROSCOPIC
Glucose, UA: NEGATIVE mg/dL
Hgb urine dipstick: NEGATIVE
Protein, ur: 30 mg/dL — AB
pH: 7.5 (ref 5.0–8.0)

## 2010-05-05 LAB — COMPREHENSIVE METABOLIC PANEL
ALT: 23 U/L (ref 0–35)
AST: 24 U/L (ref 0–37)
Alkaline Phosphatase: 93 U/L (ref 39–117)
CO2: 29 mEq/L (ref 19–32)
GFR calc Af Amer: >60 mL/min (ref >60)
Glucose, Bld: 112 mg/dL — ABNORMAL HIGH (ref 70–99)
Potassium: 4.4 mEq/L (ref 3.5–5.1)
Sodium: 139 mEq/L (ref 135–145)
Total Protein: 6.7 g/dL (ref 6.0–8.3)

## 2010-05-05 LAB — CBC
HCT: 29.7 % — ABNORMAL LOW (ref 36.0–46.0)
HCT: 40.1 % (ref 36.0–46.0)
Hemoglobin: 13.6 g/dL (ref 12.0–15.0)
Hemoglobin: 9.9 g/dL — ABNORMAL LOW (ref 12.0–15.0)
MCH: 31 pg (ref 26.0–34.0)
MCHC: 33.3 g/dL (ref 30.0–36.0)
MCV: 93.1 fL (ref 78.0–100.0)
RDW: 12.2 % (ref 11.5–15.5)
WBC: 8 10*3/uL (ref 4.0–10.5)

## 2010-05-05 LAB — BASIC METABOLIC PANEL
BUN: 6 mg/dL (ref 6–23)
CO2: 25 mEq/L (ref 19–32)
Glucose, Bld: 165 mg/dL — ABNORMAL HIGH (ref 70–99)
Potassium: 3.7 mEq/L (ref 3.5–5.1)
Sodium: 136 mEq/L (ref 135–145)

## 2010-05-05 LAB — PROTIME-INR: Prothrombin Time: 13.7 seconds (ref 11.6–15.2)

## 2010-05-05 LAB — SURGICAL PCR SCREEN: Staphylococcus aureus: POSITIVE — AB

## 2010-05-05 LAB — URINE MICROSCOPIC-ADD ON

## 2010-07-05 NOTE — Op Note (Signed)
NAMESHAREESE, MACHA NO.:  [id number]   MEDICAL RECORD NO.:  [id number]          PATIENT TYPE:  AMB   LOCATION:  ENDO                         FACILITY:  [facility]   PHYSICIAN:  Georgiana Spinner, M.D.    DATE OF BIRTH:  07-Sep-1930   DATE OF PROCEDURE:  DATE OF DISCHARGE:                               OPERATIVE REPORT   PROCEDURE:  Upper endoscopy.   INDICATIONS:  GERD with Barrett esophagus.   ANESTHESIA:  Fentanyl 30 mcg, Versed 2 mg.   PROCEDURE:  With the patient mildly sedated in the left lateral  decubitus position, the Pentax videoscopic endoscope was inserted in the  mouth and passed under direct vision through the esophagus, which  appeared normal on direct view, into stomach via hiatal hernia.  Fundus,  body, antrum, duodenal bulb, second portion of duodenum were visualized.  From this point, the endoscope was slowly withdrawn, taking  circumferential views of duodenal mucosa until the endoscope then pulled  back into stomach, placed in retroflexion to view the stomach from  below.  An area of Barrett's was seen, photographed and biopsied.  The  endoscope was straightened and withdrawn.  The patient's vital signs and  pulse oximeter remained stable.  The patient tolerated the procedure  well without apparent complications.   FINDINGS:  A small area of Barrett esophagus above the hiatal hernia  biopsied.  Await biopsy report.  The patient will call me for results  and follow up with me as an outpatient.           ______________________________  Georgiana Spinner, M.D.     GMO/MEDQ  D:  04/13/2008  T:  04/13/2008  Job:  [id number]

## 2010-07-08 NOTE — Discharge Summary (Signed)
[name], [name]                          ACCOUNT NO.:  [id number]   MEDICAL RECORD NO.:  [id number]                   PATIENT TYPE:  OIB   LOCATION:  6523                                 FACILITY:  [facility]   PHYSICIAN:  Thereasa Solo. Little, M.D.              DATE OF BIRTH:  22-Jul-1930   DATE OF ADMISSION:  10/06/2002  DATE OF DISCHARGE:  10/07/2002                                 DISCHARGE SUMMARY   ADMISSION DIAGNOSIS:  Positive nuclear study.   DISCHARGE DIAGNOSES:  1. Coronary artery disease.  2. Peripheral vascular disease.  3. History of hyperlipidemia.  4. Hypertension.  5. Arthritis.   PROCEDURE:  Left heart catheterization and primary stenting to circumflex.   COMPLICATIONS:  None.   DISCHARGE STATUS:  Pain free.   HISTORY AND PHYSICAL EXAM:  See note on chart but briefly, [name] is a 77-  year-old female who has known severe peripheral vascular disease and a small  abdominal aortic aneurysm.  As part of her preop evaluation a nuclear study  was performed, this showed lateral wall ischemia.  She had at that time a  59% ejection fraction.  She was brought in for outpatient cardiac  catheterization.   HOSPITAL COURSE:  The patient underwent cardiac catheterization that  revealed a 70% proximal right coronary artery lesion and an 80%+ high-grade  stenosis in the circumflex OM system.  The LAD was free of significant  disease and LV function was normal.  Primary stenting of the circumflex with  a 2.75 Cypher stent was carried out without difficulty.  She was given IV  Angiomax during the procedure.   She has had no recurrent chest pain, her cardiac enzymes are negative  following the intervention, and her EKG remains normal.  Her BUN and  creatinine are 8 and 0.8 the day after the catheterization.  She has mild  bruising at her catheterization site.  She is felt to be ready for  discharge.  Her blood pressure was slightly elevated prior to sheath removal  requiring 10 mg of IV Labetalol.   DISCHARGE MEDICATIONS:  1. Plavix 75 mg once daily.  2. Enteric coated aspirin 81 mg daily.  3. Lipitor 40 mg daily.  4. Norvasc 5 mg daily.  5. Protonix 40 mg daily.  6. Plaquenil 200 mg b.i.d.  7. Bextra 10 mg daily.  8. Sublingual nitroglycerin on a p.r.n. basis.   I plan to reevaluate her in three to four weeks.  She is instructed to stay  on a low salt, low fat diet and her activities are limited for the next 48  hours.   Because of the Cypher stent she will require Plavix therapy for a minimum of  six months.  I will discuss with Dr. Arbie Cookey plans for revascularization.  Intravenous heparin would be an option as a cross-over during surgery but I  would prefer to wait a minimum  of three months before she undergoes  revascularization of her lower extremities.                                                 Thereasa Solo. Little, M.D.    ABL/MEDQ  D:  10/07/2002  T:  10/08/2002  Job:  [id number]

## 2010-07-08 NOTE — Discharge Summary (Signed)
[name], [name]                          ACCOUNT NO.:  [id number]   MEDICAL RECORD NO.:  [id number]                   PATIENT TYPE:  INP   LOCATION:  2021                                 FACILITY:  [facility]   PHYSICIAN:  [name], M.D.                 DATE OF BIRTH:  October 18, 1930   DATE OF ADMISSION:  01/19/2003  DATE OF DISCHARGE:  01/30/2003                                 DISCHARGE SUMMARY   ADMISSION DIAGNOSIS:  Aortoiliac occlusive disease, symptomatic.   PAST MEDICAL HISTORY:  1. Peripheral vascular disease, status post femoral bypass in June, 1994.  2. Hypertension.  3. Hypercholesterolemia.  4. Coronary artery disease.  5. Gastroesophageal reflux disease.  6. Arthritis.  7. Aortoiliac occlusive disease.   ALLERGIES:  1. CODEINE, which causes nausea and vomiting.  2. DARVOCET, which causes nausea and vomiting.  3. PENICILLIN.  Patient has a remote history of swelling and rash.   DISCHARGE DIAGNOSIS:  1. Aortoiliac occlusive disease, status post aortobifemoral bypass grafting.  2. Postoperative anemia.   BRIEF HISTORY:  Patient is a 77 year old female with a long history of  peripheral vascular disease, who is status post femoral bypass in June,  1994, by Dr. Arbie Cookey.  The patient has aortoiliac occlusive disease with  symptoms of bilateral claudication.  Patient was also found to have coronary  artery disease in August, 2004 and has had stent placed for two areas of  stenosis in August, 2004.  The patient has experienced generalized fatigue  and decreased stamina since that time.  The patient denied any nausea,  vomiting, palpitations, weight loss, fever, chills, diarrhea, or  constipation prior to admission.  Dr. Arbie Cookey has been following this patient  long term since her surgery in 1994.  He has been following the progression  of her peripheral vascular disease and has felt that recently with  increasing symptoms of claudication, it was necessary for the patient  to  undergo aortobifemoral bypass grafting.  He has also noted the patient had a  small abdominal aortic aneurysm.  This would be repaired at the time of the  aortobifemoral.   HOSPITAL COURSE:  Patient was admitted and taken to the OR on January 19, 2003 for aortobifemoral bypass with a 14 X 8 Amcill graft.  The patient  tolerated the procedure well.  Patient was hemodynamically stable  immediately postoperatively and was extubated without problems.  Patient  awoke from anesthesia neurologically intact.  Patient was begun on a cardiac  rehab on postoperative day #2; however, she was very reluctant and  complained of incisional pain and did not want to walk.  On postoperative  day #2, the patient was started on clear liquids.  The patient progressed  slowly and slow to mobilize.  Patient's diet has been very slowly advanced.  She was moved from a mechanical soft diet on postoperative day #4.  Also on  postoperative day #5, the patient began to complain of some right lower  extremity pain.  There was trace edema present; however, the incisions were  healing well, and there was palpable pulses present in the foot.  The  patient also complained of a scalded tongue.  The patient was then started  on magic mouthwash.  The patient did experience one episode of emesis on  postoperative day #6; therefore, her diet was decreased to liquids again.  Due to right lower extremity swelling and pain, the patient was taken for a  lower extremity arterial and venous evaluation on postoperative day #7.  The  bilateral ABI's and Doppler waveforms were within normal limits at rest, and  there was no evidence of DVT or superficial thrombosis.  From a cardiac  standpoint, the patient has remained stable postoperatively.  On  postoperative day #8, the patient was noted to have anemia with a hemoglobin  and hematocrit of 7.7 and 23.6.  She was asymptomatic at the time;  therefore, this monitored.  On  postoperative day #8, the patient's diet had  been advanced again to a mechanical soft, and she was tolerating this well.  Patient had no other episodes of nausea or vomiting.  Patient continue to  progress with her ambulation and had begun to do much better.  On the day  prior to discharge, postoperative day #10, the patient was without  complaint.  She has had positive bowel movements and flatus.  She was  afebrile.  Vital signs were stable.  The abdomen was soft, nontender, and  nondistended.  There were positive bowel sounds present.  The right lower  extremity edema is resolving.  The patient is felt to be stable for  discharge at this time.   LABS:  BNP on January 28, 2003:  Sodium 139, potassium 4.7, BUN 3,  creatinine 0.7, glucose 104.  Hemoglobin and hematocrit on January 28, 2003  8.4 and 25.1.  CBC on January 27, 2003:  White count 5.4, hemoglobin 7.7,  hematocrit 23.6, platelets 235.   CONDITION ON DISCHARGE:  Improved.   INSTRUCTIONS:  1. Medications:  Bextra 10 mg p.o. q.d., Plaquenil 200 mg p.o. b.i.d.,     Protonix 40 mg p.o. b.i.d., Norvasc 5 mg p.o. q.d., Lipitor 40 mg p.o.     q.d., Plavix 75 mg p.o. q.d., aspirin 81 mg p.o. q.d., Toprol XL 25 mg     p.o. q.d., Altace 2.5 mg p.o. q.h.s., Ultram 60 mg 1-2 p.o. q.6h. p.r.n.     pain.  2. Activity:  No driving, heavy lifting, pulling or pushing.  Patient     received daily breathing and walking exercises.  3. Diet:  Low salt, low fat, low cholesterol.  4. Wound care:  Shower daily.  Clean the incision with soap and water.  No     creams or lotions on the incisions.  5. Special instructions:  If the incisions become red, swollen, painful, or     draining, or if the patient has a fever of 101 degrees Fahrenheit, she is     to call the CVTS office.   FOLLOW UP APPOINTMENTS:  1. Dr. Clarene Duke in two weeks.  Patient is to call and set up the appointment.  2. Dr. Arbie Cookey.  The CVTS office will call the patient with the  appointment,    which will be set up for three weeks from discharge.      Pecola Leisure, PA  [name], M.D.    AY/MEDQ  D:  01/29/2003  T:  01/30/2003  Job:  [id number]   cc:   Thereasa Solo. Little, M.D.  [address]  [address]  [place]  [place] [address]  Fax: [phone]

## 2010-07-08 NOTE — Op Note (Signed)
[facility]  Patient:    [name], [name]                       MRN: [id number] Proc. Date: 08/14/00 Adm. Date:  [id number] Attending:  Alyson Locket CC:         Cath lab   Operative Report  PREOPERATIVE DIAGNOSIS:  Bilateral lower extremity arterial insufficiency with probable right iliac artery stenosis.  POSTOPERATIVE DIAGNOSIS:  Bilateral lower extremity arterial insufficiency with probable right iliac artery stenosis.  OPERATION PERFORMED: 1. Aortogram with bilateral lower extremity run-off. 2. Right common iliac artery angioplasty and Palmaz stent placement. 3. Right external iliac angioplasty and Palmaz stent placement.  SURGEON:  [name], M.D.  ASSISTANT:  Nurse.  ANESTHESIA:  1% lidocaine local.  COMPLICATIONS:  None.  DISPOSITION:  To holding area stable.  DESCRIPTION OF PROCEDURE:  The patient was taken to the peripheral vascular cath lab and placed in the supine position where the area of both groins was prepped and draped in the usual sterile fashion.  The patient had a prior fem-fem bypass graft placed several years earlier.  The initial stick was in the graft itself and therefore the needle was removed and another repeat stick in the right groin lower than the prior anastomosis was accomplished.  A guide wire was passed up to the level of the superrenal aorta.  A pigtail catheter was passed over a 5 [place] sheath.  This was positioned at the level of the renal arteries and AP projections were undertaken.  This revealed widely patent single renal arteries bilaterally with no evidence of stenosis in the aorta.  There was moderate stenosis in the common iliac artery and a high grade web-like stenosis in the right external iliac artery just below the internal iliac artery take-off.  The left external iliac artery was chronically occluded and the internal iliac artery was widely patent.  Run-off was obtained by  pulling the pigtail catheter down to the level of the aortic bifurcation and run-off was obtained.  This revealed again the web-like stenosis of the external iliac artery and a moderate stenosis in the common iliac artery on the right.  Again was noted the external iliac artery occlusion on the left.  The femoral to femoral bypass graft was widely patent with no evidence of an anastomotic stenoses.  The superficial femoral arteries were patent bilaterally with mild to moderate narrowing bilaterally.  The popliteal arteries were widely patent and there was normal three-vessel run-off on the left.  On the right there was two-vessel run-off via the anterior tibial peroneal artery.  The peroneal artery became the posterior tibial artery at the ankle.  Decision was made to proceed with angioplasty of the iliac stenosis.  The patient was given 3000 units of intravenous heparin and the short sheath was exchanged for a long sheath.  A ____________ tape was used for marking the level of stenosis.  A premounted 294 stent on a 7 x 3 mm balloon was positioned at the level of the proximal stenosis and the common iliac artery and dilated.  This was considerably undersized.  The external iliac artery then had the 204 Palmaz stent loaded on the same 7 mm x 3 cm balloon and positioned just below the take-off of the internal iliac artery and deployed.  This was with a 7 mm balloon.  This gave good initial dilatation of this web-like stenosis.  Repeat arteriogram was obtained by  positioning the sheath back through the proximal stenosis and injection was taken through the long sheath.  This showed that the proximal common iliac artery stent had actually migrated proximally into the distal aorta.  This was still positioned on the guide wire.  Attempts were made at retrieving this back into the iliac artery with common iliac artery with the balloon.  Due to the moderate stenosis that was still present in the  common iliac artery, this was not possible.  For this reason, a 10 mm balloon x 2 cm was chosen and 204 Palmaz stent was placed on this and this was dilated at the level of the marked stenosis in the common iliac artery.  This was then continued to be dilated up to a 12 mm balloon.  Finally a 10 mm x 4 cm balloon was positioned over the guide wire and positioned into the stent that migrated into the distal aorta and proximal common iliac artery.  This was then brought down into the proximal portion of the 204 Palmaz stent that had been prior deployed and this was deployed with a 10 mm balloon.  Next, the 12 mm balloon was exchanged for this, positioned into the initially placed 294 stent and again was dilated.  The pigtail catheter was positioned in the distal aorta and completion arteriogram revealed excellent result with no evidence of residual stenosis.  The common and external iliac arteries were widely patent.  The proximalmost stent was positioned in the common iliac artery at its lower point and the upper extent of the stent did extend into the aorta.  There was no evidence that this was mobile and I felt this was adequately trapped with the prior placed stent.  The catheter and guide wire were removed.  The patient was transferred to the holding area in stable condition. DD:  08/14/00 TD:  08/14/00 Job: [id number]
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
## 2010-07-08 NOTE — Cardiovascular Report (Signed)
[name], WARR NO.:  [id number]   MEDICAL RECORD NO.:  [id number]          PATIENT TYPE:  OIB   LOCATION:  2899                         FACILITY:  [facility]   PHYSICIAN:  Thereasa Solo. Little, M.D. DATE OF BIRTH:  07/20/1930   DATE OF PROCEDURE:  DATE OF DISCHARGE:                              CARDIAC CATHETERIZATION   PROCEDURE:  Cardiac catheterization.   SURGEON:  Thereasa Solo. Little, M.D.   This 77 year old female has had stent placement to both her RCA and  circumflex in 2004.  She presented with increasing episodes of dyspnea on  exertion and atypical chest discomfort.  She did not want to have a repeat  stress test and because of this was brought in as an outpatient for a  cardiac catheterization.   She has along history of peripheral vascular disease and has had  femoropopliteal bypass since her 2004 intervention.   After obtaining informed consent, the patient was prepped and draped in the  usual sterile fashion exposing the right groin, applying local anesthetic,  1% Xylocaine.  The Seldinger technique was employed, and a 5-French  introducer sheath was placed into the right femoral artery.  Attempts at  passing the iliacs were unsuccessful.  Just below the bifurcation of the  aorta the iliac appeared to be 100% occluded.  Several hand injections  showed the same.   Because of this the left groin was prepped, and a 5-French introducer sheath  was placed into the left femoral artery.  The guide wire was easily passed  up the aorta into the aortic root and after that, wire exchange technique  was performed for catheter changes.   Left and right coronary arteriography and ventriculography in the RAO  projection was performed.   Total contrast 130 cc.   EQUIPMENT:  5-French Judkins diagnostic catheters.   COMPLICATIONS:  None.   RESULTS:  1.  Hemodynamic monitoring:  Central aortic pressure was 180/78.  Left      ventricular pressure was 182/6.   At the time of pullback, there was no      aortic valve gradient noted.  2 .  Ventriculography:  Ventriculography in the RAO projection using 20 cc  of contrast at 12 cc/second revealed normal left ventricular systolic  function.  There was mitral valve prolapse but no mitral regurgitation seen.  Ejection fraction was in excess of 55%, and the end-diastolic pressure was  13.   CORONARY ANGIOGRAPHY:  Dense calcification was seen in the aortic root and  in the distribution of the coronary arteries.   FINDINGS:  1.  Left main normal.  2.  LAD:  The LAD extended down to the apex of the heart and had mild      irregularities in the mid-portion.  The first diagonal had ostial and      proximal less than 30% narrowing.  3.  Circumflex: The circumflex system was widely patent.  There was a stent      in the OM that extended across the mouth of the ongoing circumflex.  The      stent was  widely patent. There was minor, less than 20%, irregularities      proximal to and distal to the stent.  The ongoing OM was free of disease      and bifurcated in the ongoing circumflex that was jailed by the stent      was widely patent with brisk flow.  4.  Right coronary artery:  There was a stent in the proximal/mid-portion of      the RCA.  The stent was widely patent.  The distal RCA and PDA were free      of disease.   CONCLUSIONS:  1.  Normal left ventricular systolic function.  2.  Mitral valve prolapse.  3.  Patent stents in both the right coronary artery and circumflex with no      high-grade stenosis in the coronary bed.   I cannot explain her atypical chest pain and dyspnea on exertion from her  catheterization data.   It should be pointed out that on fluoroscopy, there was a large area of  calcification in her right chest.  It did not move with deep inspiration or  with the cardiac cycle, and appears to be all soft tissue in origin.   The patient will be discharged to home later today to  follow up in my  office.       ABL/MEDQ  D:  12/09/2003  T:  12/09/2003  Job:  [id number]   cc:   Rolena Infante, M.D.

## 2010-07-08 NOTE — Op Note (Signed)
[name], [name]                          ACCOUNT NO.:  [id number]   MEDICAL RECORD NO.:  [id number]                   PATIENT TYPE:  INP   LOCATION:  2305                                 FACILITY:  [facility]   PHYSICIAN:  [name], M.D.                 DATE OF BIRTH:  10/13/1930   DATE OF PROCEDURE:  01/19/2003  DATE OF DISCHARGE:                                 OPERATIVE REPORT   PREOPERATIVE DIAGNOSIS:  Aortoiliac occlusive disease and small abdominal  aortic aneurysm.   POSTOPERATIVE DIAGNOSIS:  Aortoiliac occlusive disease and small abdominal  aortic aneurysm.   PROCEDURE:  Aortofemoral bypass with a 14 x 8 Hemashield graft.   SURGEON:  [name], M.D.   ASSISTANT:  Jerold Coombe, P.A.   ANESTHESIA:  General endotracheal.   COMPLICATIONS:  None.   DISPOSITION:  To surgical intensive care unit in stable condition.   DESCRIPTION OF PROCEDURE:  The patient was taken to the operating room and  placed in the supine position where the area of the abdomen and both groins  were prepped and draped in the usual sterile fashion.  Incision was made  from the level of the xiphoid to below the umbilicus and carried down  through the midline fascia with electrocautery.  The abdomen was explored  and small bowel, large bowel, stomach, liver, and gallbladder were normal.  The Omnitrak retractor was used for exposure.  The transverse colon and  omentum were reflected superiorly.  The small bowel was reflected to the  right.  The duodenum was mobilized off the aorta and the aorta was encircled  below the level of the renal arteries.  The patient had a small infrarenal  abdominal aortic aneurysm that stopped above the level of the bifurcation.  The patient had a prior right common iliac artery stent which was palpable  through the wall of the iliac artery.  The aorta was mobilized down to the  level of the bifurcation and the common iliac arteries were encircled with  vessel loops.  Next, separate incisions were made over the groins  bilaterally and carried down to isolate the prior fem/fem bypass which was  patent.  The fem/fem bypass limbs and the common femoral artery proximally  and the superficial femoral and profunda femoris arteries distally were all  isolated and encircled with vessel loops.  Tunnels were created in the  retroperitoneum from the level of the common femoral arteries bilaterally,  behind the level of the ureters bilaterally, up to the aortic bifurcation.  Umbilical tapes were placed through these tunnels to retain the positioning.  The patient was given 25 grams of Mannitol and 7000 units of intravenous  heparin.  After adequate circulation time, the aorta was occluded below the  level of the renal arteries.  The left common iliac artery was doubly  ligated with #5 Polydek suture and the right common  iliac artery was  occluded with the Henley clamp.  The aortic aneurysm was opened and the  lumbar back bleeders were controlled the Hemoclips.  The aorta was  transected below the level of the proximal clamp.  The right common iliac  artery was exposed.  The prior placed stent was removed and the right common  iliac artery was oversewn with two layers of 3-0 Prolene suture.  The 14 x 8  Hemashield graft was brought onto the field, was cut to the appropriate  length, was sewn end-to-end to the aorta just below the level of the renal  arteries with a running 3-0 Prolene suture.  Anastomosis was tested and  several additional sutures were required for hemostasis.  The right and left  limbs of the graft were brought to the respect groins through the prior  placed tunnels.  The right common, superficial femoris, and profunda femoris  arteries were occluded.  The fem/fem bypass was occluded as well.  The old  fem/fem bypass was excised from the common femoral to superficial femoral  artery junction. The right limb of the graft was cut to the  appropriate  length and was sewn end-to-side to the common femoral, superficial femoral  artery junction with a running 5-0 Prolene suture.  Prior to completion of  this anastomosis, the usual flushing maneuvers and anastomosis was  completed.  Next, the clamps were removed restoring flow to the right leg.  Next, the left common, superficial femoral, and profunda femoris arteries  were occluded.  Again, the left limb of the prior fem/fem bypass was excised  and the left limb of the graft was cut to the appropriate length and was  sewn end-to-side to the artery with a running 5-0 Prolene suture.  Again the  usual flushing maneuvers were undertaken, the anastomosis was completed, and  flow was restored to the left leg.  The patient was given 50 mg of Protamine  to reverse the heparin.  The wounds were irrigated with saline.  Hemostasis  with electrocautery.  The inferior mesenteric artery was ligated at its  takeoff from the aorta.  The groins were closed after hemostasis was  obtained with 2-0 Vicryl in the subcutaneous and subcuticular tissue and  skin was closed with skin clips.  The aneurysm wall was closed over the  graft with a running 2-0 Vicryl suture.  Next the retroperitoneum was closed  with a 2-0 Vicryl suture to exclude the retroperitoneum from the bowel.  The  small bowel was run in its entirety and found to be without injury and  placed back in the pelvis. The transverse colon and omentum were placed over  this. The midline fascia was closed with a #1 PDS suture beginning  proximally and distally and tying in the middle.  The skin was closed with  skin clips. A sterile dressing was applied and the patient was taken to the  surgical intensive care unit in stable condition.                                               [name], M.D.    TFE/MEDQ  D:  01/19/2003  T:  01/19/2003  Job:  [id number]

## 2010-07-08 NOTE — Discharge Summary (Signed)
NAMEEVANIE, BUCKLE                          ACCOUNT NO.:  [id number]   MEDICAL RECORD NO.:  [id number]                   PATIENT TYPE:  INP   LOCATION:  2021                                 FACILITY:  [facility]   PHYSICIAN:  [name], M.D.                 DATE OF BIRTH:  1930/08/12   DATE OF ADMISSION:  01/19/2003  DATE OF DISCHARGE:                                 DISCHARGE SUMMARY   ADDENDUM TO DISCHARGE SUMMARY:  Prior to Mrs. Myer's discharge, her blood pressure medications were  evaluated closely.  The final decision was for Mrs. Schauer to go home only on  Norvasc 5 mg p.o. daily and Toprol-XL 25 mg daily.  We will discontinue the  Altace that she was taking here as an inpatient.  The patient's blood  pressure has been running in the low 100's to 120's/60's on two blood  pressure agents.  The patient will follow up within 1-2 weeks with Dr.  Clarene Duke for further evaluation of her cardiac medications.  Please note that  the patient also no long takes Bextra secondary to the possible adverse  effects with the recent findings regarding Vioxx.      Carolyn A. Eustaquio Boyden.                  [name], M.D.    CAF/MEDQ  D:  01/30/2003  T:  01/31/2003  Job:  [id number]

## 2010-07-08 NOTE — Op Note (Signed)
NAMEAMARYLIS, ROVITO NO.:  [id number]   MEDICAL RECORD NO.:  [id number]          PATIENT TYPE:  AMB   LOCATION:  ENDO                         FACILITY:  [facility]   PHYSICIAN:  Georgiana Spinner, M.D.    DATE OF BIRTH:  1930-08-18   DATE OF PROCEDURE:  05/09/2004  DATE OF DISCHARGE:                                 OPERATIVE REPORT   PROCEDURE:  Upper endoscopy.   INDICATIONS:  GERD.   ANESTHESIA:  Demerol 30, Versed 3 mg.   PROCEDURE:  With the patient mildly sedated in the left lateral decubitus  position, the Olympus videoscopic endoscope was inserted in the mouth,  passed under direct vision through the esophagus which appeared normal.  We  passed through a hiatal hernia into the stomach.  Fundus, body, antrum,  duodenal bulb, second portion duodenum were visualized.  From this point,  the endoscope was slowly withdrawn, taking circumferential views of duodenal  mucosa until the endoscope had been pulled back in the stomach, placed in  retroflexion to view the stomach from below, and biopsy of an erythematous  area at the squamocolumnar junction was taken.  The endoscope was then  withdrawn, taking circumferential views of remaining gastric and esophageal  mucosa.  The patient's vital signs, pulse oximeter remained stable.  The  patient tolerated procedure well without complications.   FINDINGS:  Large hiatal hernia with biopsy of squamocolumnar junction area.  Await biopsy report.  The patient will call me for results and follow-up  with me as an outpatient.  Proceed to colonoscopy as planned.      GMO/MEDQ  D:  05/09/2004  T:  05/09/2004  Job:  [id number]

## 2010-07-08 NOTE — Procedures (Signed)
[facility]  Patient:    [name], [name]                       MRN: [id number] Adm. Date:  [id number] Attending:  Sabino Gasser                           Procedure Report  PROCEDURE:  Upper endoscopy.  ENDOSCOPIST:  Sabino Gasser, M.D.  INDICATIONS:  Reflux symptomatology, ? Barretts esophagus.  ANESTHESIA:  Demerol 50 mg, Versed 5 mg.  DESCRIPTION OF PROCEDURE:  With patient mildly sedated in the left lateral decubitus position, the Olympus videoscopic endoscope was inserted in the mouth and passed under direct vision through the esophagus.  Distal esophagus was approached and a hiatal hernia was seen.  There was a questionable area of short-segment Barretts esophagus; photograph was taken.  Biopsies were taken of this area.  Subsequently, we entered into the stomach.  Fundus, body and antrum were visualized and photographs taken.  There was some mild erythema right around the pyloric area; this was biopsied.  We entered into the duodenal bulb and second portion of duodenum, both of which appeared normal. From this point, the endoscope was slowly withdrawn, taking circumferential views of the entire duodenal mucosa, till the endoscope had been pulled back into the stomach and placed in retroflexion to view the stomach from below and a hiatal hernia was seen and photographed.  The endoscope was then straightened and withdrawn.  Patients vital signs and pulse oximetry remained stable.  Patient tolerated the procedure well without apparent complications.  FINDINGS:  Question of short-segment Barretts esophagus above the hiatal hernia, biopsied; await biopsy report.  Mild erythema of the prepyloric area, await biopsy report.  PLAN:  Patient will call me for results and follow up with me as an outpatient. DD:  03/19/00 TD:  03/19/00 Job: [id number]

## 2010-07-08 NOTE — Cardiovascular Report (Signed)
NAMEIYANNI, HEPP                          ACCOUNT NO.:  [id number]   MEDICAL RECORD NO.:  [id number]                   PATIENT TYPE:  OIB   LOCATION:  6523                                 FACILITY:  [facility]   PHYSICIAN:  Thereasa Solo. Little, M.D.              DATE OF BIRTH:  02/26/1930   DATE OF PROCEDURE:  10/06/2002  DATE OF DISCHARGE:                              CARDIAC CATHETERIZATION   INDICATIONS FOR PROCEDURE:  Ms. Marlowe is a 77 year old female with  significant peripheral vascular disease and is requiring aorto-bifemoral  revascularization in addition to aneurysm repair. A nuclear stress test was  performed as part of her preoperative screening, which showed lateral wall  ischemia and an ejection fraction of 57%. Because of the abnormal nuclear  study, she is brought in for cardiac catheterization. She is actually so  limited from a claudication standpoint that I cannot tell whether or not she  has any anginal complaints.   PROCEDURE:  The patient was prepped and draped in the usual sterile fashion,  exposing the right groin. Following local anesthetic with 1% Xylocaine, the  Seldinger technique was employed and a #5 [place] introducer sheath was  placed into her right femoral artery. A Glidewire was used to navigate  through the iliac arteries and up the descending aorta. Following this,  right and left coronary arteriography was performed. Catheter exchange was  done using standard guidewire exchange technique. After the coronary  angiograms were performed, arrangements were made for primary stenting to  her circumflex and this was performed without incident using a #6 [place]  system and at the termination of the procedure, ventriculography in the RAO  projection was performed.   During the procedure, the patient was given IV Angiomax once the primary  stenting procedure was started. At the termination of the procedure, she was  given 300 mg of Plavix. Her systolic  pressure got as high as 190 and because  of this, she was started on IV nitroglycerin temporarily and this will be  weaned and discontinued as her blood pressure comes down later today.   RESULTS:  A. HEMODYNAMIC MONITORING:     a. Central aortic pressure 152/80.     b. Left ventricular pressure was 157/3. There was no significant valve        gradient noted at the time of pullback.   A. VENTRICULOGRAPHY:  Ventriculography in the RAO projection revealed a     ejection fraction of 53%. The left ventricular        wall motions were normal. Mitral valve prolapse without mitral  regurgitation was seen. The end-diastolic pressure was 7.   A. CORONARY ARTERIOGRAPHY:  On fluoroscopy, there was an area of dense     calcification that appeared to be in the lung parenchyma. It did not     appear to be vascular.         a.  Left main:  Normal.        b.  LAD:  The LAD extended down to the apex of the heart with minimal  irregularities. It gave rise to          a small first diagonal branch, which had proximal 20% and 30% areas  of narrowing.        c.  Circumflex: The circumflex was a large vessel that stayed  predominantly in the AV groove but did give off 1 large       OM vessel.  Proximal to the OM vessel, was an area of 90% narrowing. It did not extend  into the ostium of the OM,      nor did it extend into the ongoing  circumflex pass a bifurcation. But it would have been almost impossible          to land a Stent without covering the ostium of 1 of the 2 branches.  The proximal portion of the OM was free of        disease, as was the distal  OM.        d.  Right coronary artery:  The right coronary artery had a focal area  of 70% narrowing that was actually a step-down    from a 2.75 mm proximal  vessel to a 2.0 mm ongoing right coronary artery. This area appeared to be  about 70% in        narrowing.  However, the remainder of the vessel was  small, as was the PDA and posterolateral  branch.        e.  Stenting:  The #5 [place] sheath, which had been placed at the  beginning of the procedure, was exchanged out for a #   6 [place] sheath. A  short luge wire was placed down the circumflex and well down into the  marginal system. A JL4 #        6 [place] guide catheter was used. Following  this, a 2.75 Cipher Stent was placed in such a manner that the proximal  area of obstruction was well covered and the Stent extended well down into  the OM system. It was deployed at 14        atmospheres for 60 seconds with  a final inflation being 15 atmospheres for 62 seconds. With this, there was  brisk         TIMI-3 flow, pre and post intervention. The ongoing  circumflex, which was clearly jailed by the Stent, showed only    minimal  narrowing.  The area that had been 90% previous to the intervention, now  appeared to be normal.   The IV Angiomax will be discontinued immediately after the procedure and she  will be discharged to home in the morning.   Because of the use of the drug illuding Stent, Plavix is recommended for a  minimum of 6 months. This may alter any plans for re-vascularization of her  lower extremities because of anti-coagulant concerns. I will discuss this  with Dr. Arbie Cookey.                                                 Thereasa Solo. Little, M.D.    ABL/MEDQ  D:  10/06/2002  T:  10/07/2002  Job:  [id number]   cc:  [name], M.D.  [address]  [place]  [place] [address]  Fax: [phone]   Rolena Infante, M.   Cath lab

## 2010-07-08 NOTE — Op Note (Signed)
[name], [name] NO.:  [id number]   MEDICAL RECORD NO.:  [id number]          PATIENT TYPE:  AMB   LOCATION:  DAY                          FACILITY:  [facility]   PHYSICIAN:  Vikki Ports, MDDATE OF BIRTH:  [birth year]   DATE OF PROCEDURE:  09/27/2004  DATE OF DISCHARGE:                                 OPERATIVE REPORT   PREOPERATIVE DIAGNOSIS:  Symptomatic cholelithiasis   POSTOPERATIVE DIAGNOSIS:  Symptomatic cholelithiasis, no evidence of ventral  hernia.   PROCEDURE:  Diagnostic laparoscopy, lysis of adhesions and laparoscopic  cholecystectomy.   SURGEON:  Danna Hefty, M.D.   ASSISTANT:  Lebron Conners, M.D.   ANESTHESIA:  General.   DESCRIPTION OF PROCEDURE:  The patient was taken to the operating room,  placed in a supine position and after adequate general anesthesia was  induced using endotracheal tube, the abdomen was prepped and draped in the  normal sterile fashion. Using a and left upper quadrant 11 mm OptiView,  peritoneal access was obtained, pneumoperitoneum was obtained. With gentle  blunt dissection with the camera, a few filmy adhesions were taken down.  Under direct vision, a 5 mm trocar was placed in the left lower quadrant and  omental adhesions were taken down from the anterior abdominal wall. This was  inspected. There was no evidence of a ventral hernia. Additional 5 mm  trocars were then placed in the right abdomen, the gallbladder was  identified. It was retracted cephalad and laterally. Dissection at the  infundibulum down to the cystic duct was accomplished. The cystic duct and  the triangle of Calot was easily identified. The cystic duct was quite  small, 1-2 mm. Its junction with the gallbladder and common bile duct right  were verified, it was triply clipped and divided as was the cystic artery.  The gallbladder was taken off the gallbladder bed using Bovie  electrocautery, placed in an EndoCatch bag and removed  from the abdomen.  Adequate hemostasis was ensured. Trocars were removed and pneumoperitoneum  had been released. Incisions were closed with subcuticular 4-0 Monocryl.  Steri-Strips and sterile dressings were applied. The patient tolerated the  procedure well and went to PACU in good condition.       KRH/MEDQ  D:  09/27/2004  T:  09/27/2004  Job:  [id number]

## 2010-07-08 NOTE — Op Note (Signed)
[facility]  Patient:    Michelle, [name] Visit Number: [id number] MRN: [id number]          Service Type: END Location: ENDO Attending Physician:  Sabino Gasser Dictated by:   Sabino Gasser, M.D. Admit Date:  12/27/2000 Discharge Date: 12/27/2000   CC:         Kern Reap, M.D.  Keturah Barre, M.D.   Operative Report  PROCEDURE:  Upper endoscopy with Savary dilation.  INDICATIONS:  Dysphagia.  The patient states that she is somewhat better, however, since she has been on medication.  ANESTHESIA:  Demerol 60, Versed 6 mg.  DESCRIPTION OF PROCEDURE:  With the patient mildly sedated in Room #2 of radiology, in the left lateral decubitus position, the Olympus videoscopic endoscope was inserted in the mouth, passed under direct vision through the esophagus which appeared grossly normal.  We entered into the stomach through a large hiatal hernia.  Fundus, body, antrum, duodenal bulb, second portion of the duodenum all visualized and appeared normal.  Photographs taken.  From this point, the endoscope was slowly withdrawn, taking circumferential views of the entire duodenum mucosa.  The endoscope was pulled back into the stomach and placed in retroflex to view the stomach from below.  The endoscope was then straightened and the guide wire was passed.  The endoscope was removed taking circumferential views of the remaining gastric and esophageal mucosa. Subsequently a Savary 15 and 18 dilators were easily passed with no resistance.  With the latter, the guide wire was removed.  The endoscope was reinserted and once again no abnormalities noted.  There was no bleeding.  The endoscope was withdrawn.  The patients vital signs remained stable.  The patient tolerated the procedure well without apparent complications.  FINDINGS:  Large hiatal hernia; otherwise an unremarkable examination at this time; question possibly decreased peristalsis.  Will await  clinical response. The patient will follow up with me as an outpatient and evaluate further depending upon her clinical response and continued response to the medication. ictated by:   Sabino Gasser, M.D. Attending Physician:  Sabino Gasser DD:  12/27/00 TD:  12/28/00 Job: [id number]

## 2010-07-08 NOTE — H&P (Signed)
NAMELOUISIANA, SEARLES NO.:  [id number]   MEDICAL RECORD NO.:  [id number]          PATIENT TYPE:  EMS   LOCATION:  MAJO                         FACILITY:  [facility]   PHYSICIAN:  Danae Chen, M.D.DATE OF BIRTH:  [birth year]   DATE OF ADMISSION:  02/25/2004  DATE OF DISCHARGE:                                HISTORY & PHYSICAL   PRIMARY CARE PHYSICIAN:  [name], M.D.   PRIMARY CARDIOLOGIST:  Thereasa Solo. Little, M.D.   CHIEF COMPLAINT:  Shortness of breath and chest pain.   HISTORY OF PRESENT ILLNESS:  The patient is a pleasant 77 year old white  female with a known history of coronary artery disease who underwent a  catheterization with stent placement in October of 2005.  She also had a  stents placed in the circumflex and RCA in August of 2004 and prior to that,  she had had bypass surgery.  She reports that she awoke around 5 a.m. with  sharp left-sided chest pain that was worse with inspiration and is  associated with shortness of breath.  It did not radiate, was not associated  with any nausea, vomiting or diaphoresis.  She was not dizzy, did not have a  headache at that time.  She reports that this chest pain was different than  her prior episodes of chest discomfort which she described more as dull and  aching.  She called 911.  She initially called her cardiologist, who told  her to call 911.  She was evaluated in the emergency room.  At this time she  denies any chest pain or shortness of breath.  As noted, she does have a  known history of heart disease and in the last catheterization report per  Dr. Clarene Duke that was done in October 2005, it showed a normal left  ventricular systolic function with patent stents in both the right coronary  artery and circumflex arteries with no high grade stenosis in the coronary  bed at that time.   PAST MEDICAL HISTORY:  1.  As above with known coronary artery disease, status post CABG and stent  placement.  2.  Dyslipidemia.  3.  She is intolerant to statin medication and is currently not on any.  4.  History of hypertension.  5.  History of peripheral vascular disease.   PAST SURGICAL HISTORY:  1.  Remote history of hysterectomy.  2.  She has also had an abdominal aortic aneurysm repair per Dr. Arbie Cookey.   SOCIAL HISTORY:  She is a former smoker.  She quit five years ago.  She has  no living children.  She has two grandchildren and one great-grandchild.  She lives alone.  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  She is  independent in the activities of her daily life.   FAMILY HISTORY:  Mother with diabetes and heart disease.  Father with  prostate cancer.   REVIEW OF SYMPTOMS:  As per the history of present illness.  No weight gain,  no weight loss, no nausea, vomiting or diarrhea.  No headache, no  hematochezia, melena or  blood per rectum.   PHYSICAL EXAMINATION:  GENERAL APPEARANCE:  She is in no acute distress.  VITAL SIGNS:  Afebrile with temperature of 97, blood pressure 142/66, pulse  __________, respiratory rate 23, O2 saturation 96% on room air.  HEENT:  Oropharynx is clear.  Head is atraumatic and normocephalic.  Pupils  are equal, round and reactive to light.  Sinus tenderness to palpation.  NECK:  Supple.  No lymphadenopathy.  LUNGS:  Clear to auscultation bilaterally.  CARDIOVASCULAR:  Regular rate, no murmurs are appreciated.  She has a  sternal scar from prior surgery.  ABDOMEN:  Soft, nontender, no rebound, no guarding with active bowel sounds.  No costovertebral angle tenderness  EXTREMITIES:  There are 2+ femoral pulses bilaterally.  She has no  peripheral edema.  NEUROLOGIC:  Nonfocal.  She is alert and oriented.   LABORATORY DATA:  Initial cardiac enzymes negative for CK-MB and negative  troponin.  White count 7.6000, hemoglobin 9.3, platelets 315.  Sodium 141,  potassium 4.1, chloride 108, CO2 26, glucose 125, BUN 10, creatinine 0.7.  Normal  liver transaminases, albumin 3.2.  D-dimer elevated at 3.25.   Portable chest x-ray shows borderline cardiomegaly, small hiatal hernia.  CT  angiogram of the chest showed no pulmonary emboli, bronchitic changes with  mild interstitial lung disease and some gallstones.   EKG is normal sinus rhythm with no ST elevation segment elevations or  depressions.  No significant changes from prior EKG.   IMPRESSION:  This is a 77 year old female with known history of coronary  artery disease status post stent placement who presents with acute dyspnea  but is not hypoxic and has a negative CT angiogram of chest for pulmonary  embolus but has an elevated D-dimer.  She reports that she did go on one  long car trip a couple of weeks ago to the beach but had no symptoms since  returning.  Although pulmonary embolus is a possibility, it is less likely  given the negative CT of the chest and lack of other clinical symptoms and  the resolution of her symptoms currently.  Recent catheterization did show  patent stents, however, it is prudent to admit the patient to telemetry for  23-hour observation and obtain serial cardiac enzymes.  Will make aware her  primary cardiologist of her admission and if she decompensates, will consult  during her hospital stay.  If not, will set up outpatient follow-up for her.  In the  meantime, will continue with low dose beta-blocker, ACE inhibitor, aspirin,  proton pump inhibitor, Lovenox and Tylenol.  She is anemic and is not clear  whether this has been evaluated in the past or not so we will do basic iron  study work-up and follow up with this as well.      RLK/MEDQ  D:  02/25/2004  T:  02/25/2004  Job:  [id number]   cc:   [name], M.D.  [address]  [address]  [place]  [place] [address]  Fax: [phone]   Thereasa Solo. Little, M.D.  [address]  [address]  [place]  [place] [address]  Fax: [phone]

## 2010-07-08 NOTE — Cardiovascular Report (Signed)
NAMEZERINA, HALLINAN                          ACCOUNT NO.:  [id number]   MEDICAL RECORD NO.:  [id number]                   PATIENT TYPE:  INP   LOCATION:  6525                                 FACILITY:  [facility]   PHYSICIAN:  Thereasa Solo. Little, M.D.              DATE OF BIRTH:  [birth year]   DATE OF PROCEDURE:  10/09/2002  DATE OF DISCHARGE:                              CARDIAC CATHETERIZATION   PROCEDURE:  Left heart catheterization and primary stenting to the RCA.   INDICATIONS FOR TEST:  Ms. Febres is 77 years old with bad peripheral  vascular disease.  She is being evaluated for revascularization of her lower  extremities and abdominal aortic aneurysm repair.  She had a positive  nuclear study and was admitted to the hospital on October 05, 2002 and had a  stent placed to her circumflex.  She was discharged the following day, but  readmitted later that night with nausea, vomiting and then chest tightness.  It was felt at that time that it was GI.  She was placed on proton pump  inhibitors, had no recurrent problems and was planned for discharge today  but after ambulating in the hall began having a burning sensation in her  throat and some mild nausea.  In view of this, she was brought to the cath  lab for repeat cardiac catheterization to verify patency of  the stent.  When the stent was placed last time, a 70% lesion in her right coronary  artery was left untreated.  It was not felt that that was a significant  lesion.   The patient was prepped and draped in the usual sterile fashion exposing the  right groin. Following local anesthetic with 1% Xylocaine, the Seldinger  technique was employed and a 5 [place] introducer sheath was placed into the  right femoral artery.  A right coronary catheter and a glide wire was used  to transverse the iliacs and the descending aorta.  Right and left coronary  arteriography was performed.  Once this was accomplished, intervention to  the right  coronary artery using a 6 [place] system was performed.   COMPLICATIONS:  None.   INTRAVENOUS MEDICINES:  IV Angiomax.   EQUIPMENT:  5 French Judkins configuration catheters with wire exchange  technique used for each catheter exchange.   RESULTS:   HEMODYNAMIC MONITORING:  Her blood pressure at the termination of the  procedure was 189/86.  It had been much better than that at beginning of  procedure.   Her right coronary artery was a small diameter vessel with a 70% area of  proximal narrowing.  The PDA, PL and the distal right coronary artery were  free of disease.  Left main coronary normal.  LAD and diagonal system  minimal irregularities.   Circumflex:  The stent in the mid portion of the circumflex that actually  extended from the circumflex through the ostium  jelling the ongoing  circumflex was widely patent.  There was minor irregularities well proximal  to the stent and distal to the stent was another area of about 30% narrowing  that was also present at the time of her stenting on August 16.   Because of the stenosis in her right coronary artery and her continued  symptoms, decision was made to proceed on with primary stenting.  She was  given IV Angiomax and had a therapeutic ACT of 331.   The 5 French sheath was exchanged out of a 6 [place] sheath.  At each  catheter exchange a guide wire technique was performed  A JR-4 6 [place]  guide catheter with side holes, a short Luge wire and a 2.5 x 12 Taxus was  used.  The wire was placed down into the PDA.  The stent was placed in such  a manner that proximal and distal portion of the vessel were all covered.  It was initially deployed at 12 atmospheres for 62 seconds with the final  inflation being 14 x 60 seconds.  There was still a mild area of indentation  in the mid portion of the stent and a final inflation of 15 atmospheres for  45 seconds was performed.  With this, the  area that had been at least 70%  narrowed was  widely patent.  There was brisk distal flow and no evidence of  any dissection or thrombus formation.  It is interesting that the entire  diameter of the vessel was dramatically larger once this area was opened up.   Of note, the patient complained of chest discomfort but it was different  from the burning sensation that she had in her throat.                                                 Thereasa Solo. Little, M.D.    ABL/MEDQ  D:  10/09/2002  T:  10/09/2002  Job:  [id number]   cc:   Dr. Hector Shade, M.D.  [address]  [place]  [place] [address]  Fax: [phone]

## 2010-07-08 NOTE — Op Note (Signed)
[name], [name] NO.:  [id number]   MEDICAL RECORD NO.:  [id number]          PATIENT TYPE:  AMB   LOCATION:  ENDO                         FACILITY:  [facility]   PHYSICIAN:  Georgiana Spinner, M.D.    DATE OF BIRTH:  1930-09-07   DATE OF PROCEDURE:  05/09/2004  DATE OF DISCHARGE:                                 OPERATIVE REPORT   PROCEDURE:  Colonoscopy.   ANESTHESIA:  Demerol 15, Versed 1.5 mg.   PROCEDURE:  With the patient mildly sedated in the left lateral decubitus  position and subsequently rolled to her back, the Olympus videoscopic  colonoscope was inserted in the rectum and passed under direct vision to the  cecum ,identified by ileocecal valve and appendiceal orifice, both of which  were photographed. From this point, the colonoscope was slowly withdrawn  taking circumferential views of colonic mucosa, stopping in the rectum which  appeared normal on direct and showed hemorrhoids on retroflexed view. The  endoscope was straightened and withdrawn. The patient's vital signs and  pulse oximeter remained stable. The patient tolerated procedure well without  complications.   FINDINGS:  Internal hemorrhoids. Otherwise an unremarkable colonoscopic  examination.   PLAN:  Consider repeat examination possibly in 5 years.      GMO/MEDQ  D:  05/09/2004  T:  05/09/2004  Job:  [id number]

## 2010-07-08 NOTE — Op Note (Signed)
[name], [name] NO.:  [id number]   MEDICAL RECORD NO.:  [id number]          PATIENT TYPE:  AMB   LOCATION:  ENDO                         FACILITY:  [facility]   PHYSICIAN:  Georgiana Spinner, M.D.    DATE OF BIRTH:  1930/07/28   DATE OF PROCEDURE:  01/20/2005  DATE OF DISCHARGE:                                 OPERATIVE REPORT   PROCEDURE:  Upper endoscopy with dilation.   INDICATIONS:  Dysphagia for solid foods.   ANESTHESIA:  Demerol 50, Versed 5 mg   PROCEDURE:  With patient mildly sedated in the left lateral decubitus  position, the Olympus videoscopic endoscope was inserted in the mouth,  passed under direct vision through the esophagus which appeared normal into  the stomach, through a hiatal hernia.  Fundus, body, antrum, duodenal bulb,  second portion of duodenum were visualized.  From this point, the endoscope  was slowly withdrawn, taking circumferential views of duodenal mucosa until  the endoscope had been pulled back in the stomach and placed in retroflexion  to view the stomach from below.  The endoscope was then straightened; the  guidewire was passed; the endoscope was withdrawn, taking circumferential  views of remaining gastric and esophageal mucosa.  Subsequently, Savary  dilators 15 and 17 mm were passed rather easily without blood seen on either  dilator.  With the latter, the guidewire was removed.  The endoscope was  reinserted into the stomach and withdrawn taking circumferential views once  again of the remaining gastric and esophageal mucosa.  The patient's vital  signs and pulse oximeter remained stable.  The patient tolerated the  procedure well without apparent complications.   FINDINGS:  Unremarkable examination with hiatal hernia.  Esophagus dilated  to 15 and 17 Savary dilation.  Await clinical response.  The patient will  follow up with me as an outpatient.           ______________________________  Georgiana Spinner, M.D.     GMO/MEDQ  D:  01/20/2005  T:  01/20/2005  Job:  [id number]   cc:   [name], M.D.  Fax: [phone]

## 2010-07-08 NOTE — Consult Note (Signed)
[name], [name]                          ACCOUNT NO.:  [id number]   MEDICAL RECORD NO.:  [id number]                   PATIENT TYPE:  INP   LOCATION:  2305                                 FACILITY:  [facility]   PHYSICIAN:  Thereasa Solo. Little, M.D.              DATE OF BIRTH:  1930-03-27   DATE OF CONSULTATION:  01/19/2003  DATE OF DISCHARGE:                                   CONSULTATION   REASON FOR CONSULTATION:  [name] is a 77 year old female who has known  coronary disease.  She underwent successful abdominal aortic aneurysm repair  and aortic  bi-femoral grafting by Dr. Arbie Cookey.  This was performed earlier today without  any incident and she is now stable postoperatively.   She has a past history of coronary disease and has had a Taxus stent placed  to her RCA January 09, 2003.  She had been on aspirin and Plavix for a  total of three months leading up to this hospitalization.   According to her daughter, she has been relatively active except for leg  pain and has had no problems with recurrent chest pain.   She has a past history of reflux, peripheral vascular disease,  hyperlipidemia, hypertension, remote history of cigarette abuse with no  cigarettes in the last four-and-a-half years, and rheumatoid arthritis.   ALLERGIES:  1. DARVOCET.  2. PENICILLIN.  3. CODEINE.  4. DEMEROL.   MEDICATIONS:  1. Bextra 10 mg a day.  2. Plaquenil 200 mg b.i.d.  3. Protonix 40 mg b.i.d.  4. Norvasc 5 mg a day.  5. Lipitor 40 mg a day.  6. Plavix 75 mg a day.  7. Enteric-coated aspirin once a day.   SURGICAL PROCEDURES:  1. Hysterectomy and appendectomy in the 1960s.  2. Cervical laminectomy in 1985.   She is retired, still lives alone, and is active.  Appropriate diet.   FAMILY HISTORY:  Positive for coronary disease.   REVIEW OF SYSTEMS:  Weight has been stable.  Edentulous with dentures.  Significant claudication complaints with minimal activity, diffuse aches and  pains  in all joints from arthritis.   PHYSICAL EXAMINATION:  VITAL SIGNS:  Blood pressure 156/66, pulse is 60 and  regular, respiratory rate is 10, she is on the ventilator with 100% O2  saturations.  GENERAL:  She is sedated.  LUNGS:  Predominantly clear.  CARDIAC:  Regular rhythm with no ectopic beats, no gross murmur.  ABDOMEN:  Bandage in place.  EXTREMITIES:  Feet are warm to touch.  SKIN:  Warm and dry.   Postoperative hemoglobin 8.6; platelet count 100,000.  Potassium of 3.8, BUN  12, creatinine 0.8.   ASSESSMENT:  1. Status post abdominal aortic aneurysm repair and aortic bi-femoral graft     by Dr. Arbie Cookey without complications.  2. Coronary artery disease with Taxus stent to her right coronary artery     August 2004.  She will need to be placed back on her Plavix and aspirin     as soon as possible for stent protection.  She will need to be restarted     on her lipids and her antihypertensive drugs also.   From a cardiac standpoint we will continue to follow her as an inpatient.                                               Thereasa Solo. Little, M.D.    ABL/MEDQ  D:  01/19/2003  T:  01/19/2003  Job:  [id number]   cc:   [name], M.D.  [address]  [address]  [place]  [place] [address]  Fax: [phone]   [name], M.D.  [address]  [place]  [place] [address]

## 2010-07-08 NOTE — Op Note (Signed)
[name], [name]                          ACCOUNT NO.:  [id number]   MEDICAL RECORD NO.:  [id number]                   PATIENT TYPE:  OIB   LOCATION:  NA                                   FACILITY:  [facility]   PHYSICIAN:  [name], M.D.                 DATE OF BIRTH:  06-01-1930   DATE OF PROCEDURE:  03/25/2002  DATE OF DISCHARGE:                                 OPERATIVE REPORT   PREOPERATIVE DIAGNOSIS:  Recurrent claudication bilaterally, lower  extremities.   POSTOPERATIVE DIAGNOSIS:  Recurrent claudication bilaterally, lower  extremities.   PROCEDURE:  1. Aortic and iliac arteriogram.  2. Dilatation of in-stent restenosis of the right external iliac artery     stenosis.   SURGEON:  [name], M.D.   ANESTHESIA:  1% lidocaine local.   COMPLICATIONS:  None.   DISPOSITION:  To the holding area, stable.   DESCRIPTION OF PROCEDURE:  The patient was taken to the peripheral vascular  laboratory and placed in the supine position where the area of the left  groin was prepped and draped in the usual sterile fashion.  Using local  anesthesia and a single wall puncture, the foot of the right to left femoral-  femoral bypass was accessed, and using a Seldinger technique a guide wire  was passed up to the level of the suprarenal aorta.  A 5-French sheath was  passed over the guide wire.  The Picolino catheter was positioned at the  level of the suprarenal aorta and AP projections undertaken.  This revealed  widely patent single renal arteries, and no evidence of stenosis of the  aorta.  The patient had a patent right to left femoral-femoral bypass and  oblique views of this were obtained as well, showing no evidence of  anastomotic stenosis.  The patient did have a prior common iliac artery  stent, and also an external iliac artery stent placed just distal to the  takeoff of the hypogastric artery on the right.  The common iliac artery  stent was widely patent.  There  was moderate in-stent restenosis on the  external iliac stent.  The 5-French sheath was exchanged for a 6-French  sheath.  The patient was given 3000 units of intravenous heparin.  An 8 mm x  2 cm angioplasty balloon was positioned in the prior stent, and this was  dilated.  Hand injection through the sheath revealed a continued mild to  moderate stenosis.  For this reason the balloon was up-sized to a 10 mm x 2  cm balloon, and this was redilated as well.  At the completion of the  arteriogram, revealed no evidence of residual stenosis.  The patient tolerated the procedure without immediate complications.  The  sheath was removed once the ACT was normalized.  She was returned to the  holding area in stable condition.  [name], M.D.   TFE/MEDQ  D:  03/25/2002  T:  03/25/2002  Job:  [id number]

## 2010-07-08 NOTE — Discharge Summary (Signed)
[name], [name] NO.:  [id number]   MEDICAL RECORD NO.:  [id number]          PATIENT TYPE:  INP   LOCATION:  4731                         FACILITY:  [facility]   PHYSICIAN:  Isidor Holts, M.D.  DATE OF BIRTH:  [birth year]   DATE OF ADMISSION:  02/25/2004  DATE OF DISCHARGE:  02/26/2004                                 DISCHARGE SUMMARY   PRIMARY CARE PHYSICIAN:  [name], M.D.   CARDIOLOGIST:  Thereasa Solo. Little, M.D.   DISCHARGE DIAGNOSES:  1.  Noncardiac chest pain.  2.  Acute bronchitis.  3.  Asymptomatic Cholelithiasis.   DISCHARGE MEDICATIONS:  1.  Azithromycin 500 mg p.o. x1 dose and then 250 mg p.o. daily for four      days.  2.  Continue preadmission medications.   PROCEDURE:  1.  Chest x-ray dated 02/25/04.  This showed interval borderline cardiomegaly.      Visualization of a small to moderate hiatal hernia.  Also stable mild      changes of COPD.  2.  Chest CT angiogram dated 02/25/04.  This showed no pulmonary emboli with      bronchitic changes and mild chronic interstitial lung disease as well as      a moderate size ventral hernia and cholelithiasis.   CONSULTATIONS:  Thereasa Solo. Little, M.D., Cardiologist.   HISTORY OF PRESENT ILLNESS:  Please see H&P notes 03/03/04, however, in brief  this is a 77 year old female with known history of coronary artery disease  status post PTCA and stent.  Her last catheterization with stent placement,  was in October 2005.  She presents to the emergency department with a few  hours history of sharp left-sided chest pain, aggravated by deep  inspiration, associated with shortness of breath.  She was therefore  admitted for further investigation, evaluation  and treatment.   HOSPITAL COURSE:  Problem 1. Atypical/noncardiac chest pain.  Given her  background history of coronary artery disease the patient had a 12-lead EKG  done which showed sinus rhythm with no ST elevation or depression i.e., no  acute ischemic changes and no significant changes compared to prior EKGs.  Serial cardiac enzymes were within normal limits.  Chest CT angiogram dated  02/25/04 showed no evidence of pulmonary embolism although incidental  cholelithiasis was noted, as well as bronchitic changes and mild chronic  interstitial  lung disease.  Cardiology consultation was requested and this  was kindly provided by Dr. Julieanne Manson who concluded that the patient's  chest pain was noncardiac.   Problem 2. Acute bronchitis.  The patient is an exsmoker who quit  approximately five years ago and chest x-ray dated 02/25/2004, showed stable  mild Changes of COPD.  It will be recollected, that the patient was admitted  with some shortness of breath in association with left-sided pleuritic type  chest pain.  Detailed questioning, revealed that she also had a cough  productive of yellowish phlegm for the preceding few days.  With a diagnosis  of acute bronchitis the patient was commenced on azithromycin as well as  Combivent MDI.  Problem 3. Asymptomatic cholelithiasis.  This is an incidental finding seen  on chest CT angiogram dated 02/22/04.  Clinically the patient has no symptoms  related to this.  Essentially the abdominal exam was quite benign, at the  time of her evaluation both in the emergency department and subsequently on  the floor. It is expected that this will be further addressed by patient's  PMD, Dr. Delanna Notice.   DISPOSITION:  The patient was discharged in satisfactory condition on  02/26/04.   PAIN MANAGEMENT:  Not applicable.   ACTIVITY:  As tolerated.   DIET:  Healthy Heart.   DISCHARGE INSTRUCTIONS:  She is to follow up routinely with her PMD, Dr.  Delanna Notice.  She is to call him to schedule an appointment and she has  verbalized understanding.      CO/MEDQ  D:  04/14/2004  T:  04/14/2004  Job:  [id number]   cc:   [name], M.D.  [address]  [address]  [place]  [place] [address]   Fax: [phone]   Thereasa Solo. Little, M.D.  [address]  [address]  [place]  [place] [address]  Fax: [phone]

## 2010-07-08 NOTE — Op Note (Signed)
[name], [name] NO.:  [id number]   MEDICAL RECORD NO.:  [id number]          PATIENT TYPE:  AMB   LOCATION:  ENDO                         FACILITY:  [facility]   PHYSICIAN:  Georgiana Spinner, M.D.    DATE OF BIRTH:  11/12/1930   DATE OF PROCEDURE:  03/13/2006  DATE OF DISCHARGE:                               OPERATIVE REPORT   PROCEDURE:  Upper endoscopy with Savary dilation and biopsy.   INDICATIONS:  Dysphagia with history of Barrett esophagus.   ANESTHESIA:  Fentanyl 62.5 mcg, Versed 5 mg.   PROCEDURE:  With patient mildly sedated in the left lateral decubitus  position, room 3 of endoscopy, the Pentax videoscopic endoscope was  inserted in the mouth, passed under direct vision through the esophagus,  which appeared normal until we reached the distal esophagus and there  was a questionable area of Barrett photographed.  We then entered into  the stomach through a hiatal hernia sac.  The fundus, body, antrum,  duodenal bulb, second portion of duodenum appeared normal.  From this  point, the endoscope was slowly withdrawn taking circumferential views  of the duodenal mucosa until the endoscope had been pulled back into the  stomach, placed in retroflexion and viewed the stomach from below.  The  endoscope was then straightened and a guide wire was passed.  The  endoscope was withdrawn and with fluoroscopic control a 17 Savary  dilator was passed rather easily over the guide wire.  After passage,  the guide wire was removed.  The endoscope was reinserted into the  stomach and biopsies were taken of the squamocolumnar junction.  The  endoscope was withdrawn.  The patient's vital signs and pulse oximeter  remained stable.  The patient tolerated the procedure well without  apparent complications.   FINDINGS:  Hiatal hernia with question of Barrett esophagus biopsied.  Dilation of distal esophagus to 17 Savary dilation.   PLAN:  Await clinical response and biopsy  report.  The patient will call  me for results and follow up with me as an outpatient.           ______________________________  Georgiana Spinner, M.D.     GMO/MEDQ  D:  03/13/2006  T:  03/13/2006  Job:  [id number]   cc:   [name], M.D.

## 2010-07-08 NOTE — Op Note (Signed)
   NAME:  [name], [name]                          ACCOUNT NO.:  [id number]   MEDICAL RECORD NO.:  [id number]                   PATIENT TYPE:  AMB   LOCATION:  ENDO                                 FACILITY:  [facility]   PHYSICIAN:  Georgiana Spinner, M.D.                 DATE OF BIRTH:  Jun 29, 1930   DATE OF PROCEDURE:  DATE OF DISCHARGE:                                 OPERATIVE REPORT   PROCEDURE:  Upper endoscopy.   INDICATIONS FOR PROCEDURE:  Barrett's esophagus.   ANESTHESIA:  Fentanyl 37.5 mcg, Versed 4 mg.   DESCRIPTION OF PROCEDURE:  With the patient mildly sedated in the left  lateral decubitus position, the Olympus videoscopic endoscope was inserted  in the mouth and passed under direct vision through the esophagus into a  hiatal hernia sac which appeared normal and the fundus of the stomach was  also viewed and appeared normal as did the body of the stomach. The antrum  showed some irregularity of the prepyloric area which was photographed and  biopsied. We entered into the duodenal bulb and second portion of the  duodenum appeared normal.  From this point, the endoscope was slowly  withdrawn taking circumferential views of the entire duodenal mucosa until  the endoscope was then pulled back in the stomach, placed in retroflexion to  view the stomach from below and the hiatal hernia sac was visualized from  below and photographed. The endoscope was then straightened and withdrawn  taking circumferential views of the remaining gastric and esophageal mucosa  stopping in the distal esophagus. The biopsy of the Z line at the  gastroesophageal junction clear evidence of Barrett's was difficult to see  because of the hiatal hernia.  The patient's vital signs and pulse oximeter  remained stable. The patient tolerated the procedure well without apparent  complications.   FINDINGS:  Hiatal hernia with biopsy of the demarcation line between the  stomach and esophagus.   PLAN:  Await  biopsy report. The patient is known to have Barrett's esophagus  and will have patient call me for results of biopsy and followup with me as  an outpatient.                                                 Georgiana Spinner, M.D.    GMO/MEDQ  D:  05/14/2002  T:  05/14/2002  Job:  [id number]   cc:   [name], M.D.  [address]  [address]  [place]  [place] [address]  Fax: [phone]

## 2010-07-08 NOTE — H&P (Signed)
[name], [name]                          ACCOUNT NO.:  [id number]   MEDICAL RECORD NO.:  [id number]                   PATIENT TYPE:  INP   LOCATION:  NA                                   FACILITY:  [facility]   PHYSICIAN:  [name], M.D.                 DATE OF BIRTH:  29-Sep-1930   DATE OF ADMISSION:  01/19/2003  DATE OF DISCHARGE:                                HISTORY & PHYSICAL   CHIEF COMPLAINT:  Aortoiliac occlusive disease.   HISTORY OF PRESENT ILLNESS:  This is a 77 year old female with a long  history of peripheral vascular disease, who is status post femoral bypass in  June, 1994.  She has recurrent aortoiliac occlusive disease with symptoms of  bilateral claudication.  She was also found to have coronary artery disease  in August, 2004 and had stents placed for two areas of stenosis.  She has  had generalized fatigue and decreased stamina since then.  She has had  occasional chest pressure and shortness of breath with exertion, which is  relieved with sublingual nitroglycerin.  Currently, the patient denies any  nausea, vomiting, palpitations, weight loss, fever, chills, cough or cold.  She denies any diarrhea, constipation, or urinary frequency, urgency, or  dysuria.   The patient has been followed long-term by Dr. Arbie [name], who performed her  prior surgery in 1994.  He has been following her peripheral vascular  disease on a regular basis since then and felt that recently, with her  increasing symptoms of claudication, he felt that it was necessary for her  to undergo aortobifemoral bypass grafting.  The risks, benefits and  complications of the procedure were discussed with the patient and her  family.  She agreed to proceed with surgery at this time.  Patient presents  today for preoperative history and physical.   ALLERGIES:  1. CODEINE causes nausea and vomiting.  2. DARVOCET causes nausea and vomiting.  3. PENICILLIN.  Patient has a remote history of swelling  and rash with     PENICILLIN.   CURRENT MEDICATIONS:  1. Plavix and aspirin, which she stopped on January 13, 2003.  2. Norvasc 5 mg daily.  3. Protonix 10 mg daily.  4. Lipitor 40 mg daily.  5. Sublingual nitroglycerin p.r.n. for chest pain.  6. Vitamin C.  7. Vitamin E.  8. Multivitamin.  9. Calcium.   PAST MEDICAL HISTORY:  1. Peripheral vascular disease.  2. Hypertension.  3. Hypercholesterolemia.  4. Coronary artery disease.  5. GERD.  6. Arthritis.   PAST SURGICAL HISTORY:  1. Hysterectomy in the 1960s.  2. Appendectomy in the 1960s.  3. Laminectomy of the cervical vertebrae in 1985.  4. LASIK and cataract surgery.   SOCIAL HISTORY:  [name] is retired from the [employer].  She lives  alone and has two grandchildren.  She had one daughter who died recently in  2002.  Patient has a 40-pack-year history of smoking, of which she quit  three years ago.  She denies any alcohol use.   FAMILY HISTORY:  Noncontributory.   REVIEW OF SYSTEMS:  See HPI.  Otherwise, specifically, the patient denies  any recent weight change, loss of appetite, fever, chills, or night sweats.  She denies any easy bruising or bleeding.  She denies any visual changes,  hearing changes, or headache.  The patient denies any shortness of breath,  wheezing, dyspnea on exertion, palpitations, or lower extremity edema.  Patient denies any diarrhea, constipation, nausea, vomiting, or abdominal  pain.   PHYSICAL EXAMINATION:  GENERAL:  This is a well-nourished, well-appearing 70-  year-old female who appears her stated age.  She is moderately kyphotic.  VITAL SIGNS:  Pulse 88, respirations 18, blood pressure 138/80.  Weight 156  pounds.  Height is 5'3.  HEENT:  EOMs intact.  Conjunctivae are clear.  Sclerae are anicteric.  PERRLA.  Hearing within normal limits.  Nasopharynx clear without exudate or  sores.  She has upper and lower dentures.  NECK:  Supple without lymphadenopathy,  thyromegaly, or carotid bruits.  CHEST:  Lungs are clear to auscultation throughout.  She has evidence of  kyphosis in her thoracic vertebrae.  She has no spinal or CVA tenderness.  HEART:  She is in a regular rate and rhythm with no murmur, rub or gallop.  Normal S1 and S2.  Peripheral pulses are as follows:  Carotid 2+  bilaterally, radial pulses 2+ bilaterally, femoral pulses 2+ bilaterally.  Nonpalpable distal pulses, including popliteal, posterior tibial, and  dorsalis pedis.  She has very minimal varicosities in her lower extremities.  She has pedal edema bilaterally.  Both feet are warm and well perfused.  ABDOMEN:  Soft, nontender, nondistended with good bowel sounds.  There is  evidence of a hysterectomy and a right groin scar.  She has no organomegaly  or masses.  NEUROMUSCULAR:  She has weakened right shoulder range of motion and  strength, otherwise she has good and equal strength throughout.  She is  neurologically intact with no focal deficits.   IMPRESSION:  This is a 77 year old female with a significant history of  peripheral vascular disease, which is increasing in severity with symptoms  of claudication.  She has evidence of recurrent aortoiliac occlusive  disease.   PLAN:  We will proceed as planned with aortobifemoral bypass grafting, which  will be completed on January 19, 2003 by Dr. Arbie [name].  As noted above, the  risks, benefits and complications of the procedure have been discussed with  the patient, and she agrees to proceed with surgery.      [name], P.A.-C               [name], M.D.    CAF/MEDQ  D:  01/14/2003  T:  01/14/2003  Job:  [id number]   cc:   [name], M.D.  [address]  [address]  [place]  [place] [address]  Fax: [phone]   [name], M.D.  [address]  [address]  [place]  [place] [address]  Fax: [phone]

## 2010-11-18 ENCOUNTER — Other Ambulatory Visit: Payer: Self-pay | Admitting: Orthopedic Surgery

## 2010-11-18 ENCOUNTER — Ambulatory Visit
Admission: RE | Admit: 2010-11-18 | Discharge: 2010-11-18 | Disposition: A | Discharge status: Home / Self Care | Payer: Medicare Other | Source: Ambulatory Visit | Attending: Orthopedic Surgery | Admitting: Orthopedic Surgery

## 2010-11-18 DIAGNOSIS — M545 Low back pain, unspecified: Secondary | ICD-10-CM

## 2010-11-21 HISTORY — PX: LUMBAR SPINE SURGERY: SHX701

## 2010-12-08 ENCOUNTER — Other Ambulatory Visit (HOSPITAL_COMMUNITY): Payer: Self-pay | Admitting: Orthopaedic Surgery

## 2010-12-08 ENCOUNTER — Encounter (HOSPITAL_COMMUNITY)
Admission: RE | Admit: 2010-12-08 | Discharge: 2010-12-08 | Disposition: A | Discharge status: Home / Self Care | Payer: Medicare Other | Source: Ambulatory Visit | Attending: Orthopaedic Surgery | Admitting: Orthopaedic Surgery

## 2010-12-08 ENCOUNTER — Ambulatory Visit (HOSPITAL_COMMUNITY)
Admission: RE | Admit: 2010-12-08 | Discharge: 2010-12-08 | Disposition: A | Discharge status: Home / Self Care | Payer: Medicare Other | Source: Ambulatory Visit | Attending: Orthopaedic Surgery | Admitting: Orthopaedic Surgery

## 2010-12-08 DIAGNOSIS — Z01812 Encounter for preprocedural laboratory examination: Secondary | ICD-10-CM | POA: Insufficient documentation

## 2010-12-08 DIAGNOSIS — M545 Low back pain: Secondary | ICD-10-CM

## 2010-12-08 DIAGNOSIS — I10 Essential (primary) hypertension: Secondary | ICD-10-CM | POA: Insufficient documentation

## 2010-12-08 DIAGNOSIS — Z01818 Encounter for other preprocedural examination: Secondary | ICD-10-CM | POA: Insufficient documentation

## 2010-12-08 DIAGNOSIS — Z87891 Personal history of nicotine dependence: Secondary | ICD-10-CM | POA: Insufficient documentation

## 2010-12-08 DIAGNOSIS — R0602 Shortness of breath: Secondary | ICD-10-CM | POA: Insufficient documentation

## 2010-12-08 LAB — COMPREHENSIVE METABOLIC PANEL
AST: 18 U/L (ref 0–37)
Albumin: 3.5 g/dL (ref 3.5–5.2)
Calcium: 9.7 mg/dL (ref 8.4–10.5)
Chloride: 100 mEq/L (ref 96–112)
Creatinine, Ser: 0.62 mg/dL (ref 0.50–1.10)
Total Bilirubin: 0.3 mg/dL (ref 0.3–1.2)

## 2010-12-08 LAB — DIFFERENTIAL
Basophils Absolute: 0 10*3/uL (ref 0.0–0.1)
Eosinophils Relative: 3 % (ref 0–5)
Lymphocytes Relative: 12 % (ref 12–46)
Neutro Abs: 7 10*3/uL (ref 1.7–7.7)
Neutrophils Relative %: 74 % (ref 43–77)

## 2010-12-08 LAB — SURGICAL PCR SCREEN: Staphylococcus aureus: NEGATIVE

## 2010-12-08 LAB — APTT: aPTT: 34 seconds (ref 24–37)

## 2010-12-08 LAB — CBC
HCT: 36.8 % (ref 36.0–46.0)
Hemoglobin: 12.2 g/dL (ref 12.0–15.0)
RBC: 4 MIL/uL (ref 3.87–5.11)
RDW: 12.1 % (ref 11.5–15.5)
WBC: 9.4 10*3/uL (ref 4.0–10.5)

## 2010-12-08 LAB — PROTIME-INR: INR: 1.06 (ref 0.00–1.49)

## 2010-12-09 LAB — HEPATITIS PANEL, ACUTE
Hep A IgM: NEGATIVE
Hep B C IgM: NEGATIVE
Hepatitis B Surface Ag: NEGATIVE

## 2010-12-12 ENCOUNTER — Ambulatory Visit (HOSPITAL_COMMUNITY): Payer: Medicare Other

## 2010-12-12 ENCOUNTER — Ambulatory Visit (HOSPITAL_COMMUNITY)
Admission: RE | Admit: 2010-12-12 | Discharge: 2010-12-13 | Disposition: A | Discharge status: Home / Self Care | Payer: Medicare Other | Source: Ambulatory Visit | Attending: Orthopaedic Surgery | Admitting: Orthopaedic Surgery

## 2010-12-12 DIAGNOSIS — J4489 Other specified chronic obstructive pulmonary disease: Secondary | ICD-10-CM | POA: Insufficient documentation

## 2010-12-12 DIAGNOSIS — I10 Essential (primary) hypertension: Secondary | ICD-10-CM | POA: Insufficient documentation

## 2010-12-12 DIAGNOSIS — J449 Chronic obstructive pulmonary disease, unspecified: Secondary | ICD-10-CM | POA: Insufficient documentation

## 2010-12-12 DIAGNOSIS — M48062 Spinal stenosis, lumbar region with neurogenic claudication: Secondary | ICD-10-CM | POA: Insufficient documentation

## 2010-12-12 DIAGNOSIS — Z01818 Encounter for other preprocedural examination: Secondary | ICD-10-CM | POA: Insufficient documentation

## 2010-12-12 DIAGNOSIS — Z01812 Encounter for preprocedural laboratory examination: Secondary | ICD-10-CM | POA: Insufficient documentation

## 2010-12-12 DIAGNOSIS — I2510 Atherosclerotic heart disease of native coronary artery without angina pectoris: Secondary | ICD-10-CM | POA: Insufficient documentation

## 2010-12-17 NOTE — Op Note (Signed)
[name], [name] NO.:  [id number]  MEDICAL RECORD NO.:  [id number]  LOCATION:  5006                         FACILITY:  [facility]  PHYSICIAN:  Alia Parsley C. Ophelia Charter, M.D.    DATE OF BIRTH:  [birth year]  DATE OF PROCEDURE:  12/12/2010 DATE OF DISCHARGE:                              OPERATIVE REPORT   PREOPERATIVE DIAGNOSIS:  Severe L4-5 stenosis with neurogenic claudication.  POSTOPERATIVE DIAGNOSIS:  Severe L4-5 stenosis with neurogenic claudication.  PROCEDURE:  L4-5 central decompression with foraminotomy, microscope assisted.  SURGEON:  Elad Macphail C. Ophelia Charter, MD  ASSISTANT:  Michelle Neighbors, PA-C, medically necessary and present for entire procedure.  ESTIMATED BLOOD LOSS:  100 mL.  DRAINS:  None.  ANESTHESIA:  GOT plus Marcaine skin local.  FINDINGS:  Severe central stenosis L4-5 corresponding with MRI findings with some degenerative facet cyst right and left.  PROCEDURE IN DETAIL:  After induction of general  anesthesia orotracheal intubation, all incision from previous lumbar surgeries noted incision was made starting this level based on palpable landmarks extended distally.  Once the lamina was encountered, palpitation was performed and a Kocher was placed for localization.  This was directly over the 3, 4 disk and a 2nd x-ray was taken with 2 Kochers, one just above the 4, 5 disk and the other inferiorly for the affective area of planned decompression.  Looking the 2 Kochers, the proximal Kocher on the 2nd film need to be moved up about 4 mm.  This was re-marked on the spinous process.  Central components removed removing the spinous process thinning the lamina.  Gap between L4 and L5 was noted with chunks of ligament.  Lamina was thinned slightly with the bur and then 3-mm Kerrison was used after careful palpation and freeing up adhesions from previous epidural steroid injections.  On the right side there was a facet cyst which was adherent to the dura  using microdissection to release this scar tissue and then protecting the dura the thick chunks of ligament in the lateral gutter of facet cyst was removed.  Facet cyst looked like it contributed maybe 25% of compression and was primarily due to spurs off the facet and hypertrophic ligamentum.  Lateral gutters were cleaned out to the level of the pedicle removing bone and undercutting the facet so that dura was round, had no areas of compression.  Top portion of L5 vertebrae was thin in the lamina and then removed with Kerrison rongeur after hockey stick was used to make sure there was no adherence to the dura.  Frame was enlarged particularly attention paid to the left side where she was more symptomatic.  After irrigation with saline solution and finally asleep used an operative microscope again looking at both gutters to make sure there were no remaining chunks of ligaments and Gelfoam was placed briefly down, gutters were dry and then standard layered closure with 0 Vicryl in the fascia, 2-0 Vicryl in subcutaneous tissue, subcuticular skin closure.  Tincture of benzoin, Steri-Strips, and postop dressing with 4x4s and tape.  Instrument count, needle count was correct and a time-out procedure was completed at the end of the case with the procedure as planned  and corresponded to permit.     Gabbriella Presswood C. Ophelia Charter, M.D.     MCY/MEDQ  D:  12/12/2010  T:  12/13/2010  Job:  [id number]  Electronically Signed by Annell Greening M.D. on 12/17/2010 12:28:31 PM

## 2011-01-11 ENCOUNTER — Other Ambulatory Visit (HOSPITAL_COMMUNITY): Payer: Self-pay | Admitting: *Deleted

## 2011-01-17 ENCOUNTER — Encounter (HOSPITAL_COMMUNITY)
Admission: RE | Admit: 2011-01-17 | Discharge: 2011-01-17 | Disposition: A | Discharge status: Home / Self Care | Payer: Medicare Other | Source: Ambulatory Visit | Attending: Internal Medicine | Admitting: Internal Medicine

## 2011-01-17 ENCOUNTER — Encounter (HOSPITAL_COMMUNITY): Payer: Self-pay

## 2011-01-17 DIAGNOSIS — D509 Iron deficiency anemia, unspecified: Secondary | ICD-10-CM | POA: Insufficient documentation

## 2011-01-17 HISTORY — DX: Shortness of breath: R06.02

## 2011-01-17 HISTORY — DX: Angina pectoris, unspecified: I20.9

## 2011-01-17 MED ORDER — SODIUM CHLORIDE 0.9 % IV SOLN
INTRAVENOUS | Status: AC
  Administered 2011-01-17: 12:00:00 via INTRAVENOUS

## 2011-01-17 MED ORDER — FERUMOXYTOL INJECTION 510 MG/17 ML
510.0000 mg | Freq: Once | INTRAVENOUS | Status: AC
  Administered 2011-01-17: 510 mg via INTRAVENOUS
  Filled 2011-01-17: qty 17

## 2011-01-17 NOTE — Progress Notes (Signed)
Pt tolerated Faraheme well.  Pt stayed 30 mins after injection.  Pt stable at discharge.

## 2011-01-18 ENCOUNTER — Other Ambulatory Visit (HOSPITAL_COMMUNITY): Payer: Self-pay | Admitting: *Deleted

## 2011-01-19 ENCOUNTER — Other Ambulatory Visit (HOSPITAL_COMMUNITY): Payer: Self-pay | Admitting: *Deleted

## 2011-01-20 ENCOUNTER — Encounter (HOSPITAL_COMMUNITY)
Admission: RE | Admit: 2011-01-20 | Discharge: 2011-01-20 | Disposition: A | Discharge status: Home / Self Care | Payer: Medicare Other | Source: Ambulatory Visit | Attending: Internal Medicine | Admitting: Internal Medicine

## 2011-01-20 ENCOUNTER — Encounter (HOSPITAL_COMMUNITY): Payer: Self-pay

## 2011-01-20 MED ORDER — SODIUM CHLORIDE 0.9 % IV SOLN
INTRAVENOUS | Status: DC
  Administered 2011-01-20: 13:00:00 via INTRAVENOUS

## 2011-01-20 MED ORDER — FERUMOXYTOL INJECTION 510 MG/17 ML
510.0000 mg | Freq: Once | INTRAVENOUS | Status: AC
  Administered 2011-01-20: 510 mg via INTRAVENOUS
  Filled 2011-01-20: qty 17

## 2011-03-23 ENCOUNTER — Encounter: Payer: Self-pay | Admitting: Internal Medicine

## 2011-03-23 ENCOUNTER — Ambulatory Visit (INDEPENDENT_AMBULATORY_CARE_PROVIDER_SITE_OTHER): Payer: Medicare Other | Admitting: Internal Medicine

## 2011-03-23 DIAGNOSIS — R09A2 Foreign body sensation, throat: Secondary | ICD-10-CM

## 2011-03-23 DIAGNOSIS — F458 Other somatoform disorders: Secondary | ICD-10-CM

## 2011-03-23 DIAGNOSIS — K2270 Barrett's esophagus without dysplasia: Secondary | ICD-10-CM

## 2011-03-23 DIAGNOSIS — K219 Gastro-esophageal reflux disease without esophagitis: Secondary | ICD-10-CM

## 2011-03-23 DIAGNOSIS — J029 Acute pharyngitis, unspecified: Secondary | ICD-10-CM

## 2011-03-23 DIAGNOSIS — R0989 Other specified symptoms and signs involving the circulatory and respiratory systems: Secondary | ICD-10-CM

## 2011-03-23 MED ORDER — OMEPRAZOLE MAGNESIUM 20 MG PO TBEC: DELAYED_RELEASE_TABLET | ORAL | Status: DC

## 2011-03-23 NOTE — Progress Notes (Signed)
Subjective:    Patient ID: [name], female    DOB: 05-25-1930, 78 y.o.   MRN: [id number]  HPI This elderly woman has a history of Barrett's esophagus. I saw her for the first time in late 2011. She is complaining a three-week history of a sore throat, globus sensation. There is no dysphagia. She does not have heartburn as long as she takes her Prilosec OTC daily. There is no sinus drainage or postnasal drip. In the past couple of weeks she did expectorate a gray mucus ball or something like that and that has alarmed her. She denies other upper respiratory symptoms. She had some similar problems with sore throat and chronic hoarseness, she does say she still hoarse, and 2011 and I recommended an ENT evaluation but she did not follow through with this and her symptoms at that time did resolve apparently. Allergies  Allergen Reactions  . Codeine Nausea And Vomiting  . Hydrocodone Nausea And Vomiting  . Morphine And Related Other (See Comments)    hallucinations  . Penicillins Hives   Outpatient Prescriptions Prior to Visit  Medication Sig Dispense Refill  . acetaminophen (TYLENOL) 500 MG tablet Take 2 tablets by mouth every morning      . aspirin 81 MG tablet Take 81 mg by mouth daily.        . Flaxseed, Linseed, (FLAX SEED OIL PO) Take 2 tablets by mouth daily.       . hydroxychloroquine (PLAQUENIL) 200 MG tablet Take by mouth 2 (two) times daily.        . traMADol (ULTRAM) 50 MG tablet Take 50 mg by mouth every 6 (six) hours as needed. Maximum dose= 8 tablets per day       . fish oil-omega-3 fatty acids 1000 MG capsule Take 1 g by mouth daily.        . lansoprazole (PREVACID) 30 MG capsule Take 30 mg by mouth daily.        . promethazine (PHENERGAN) 12.5 MG tablet Take 12.5 mg by mouth every 6 (six) hours as needed. 1/2 tablet as needed with tramadol       . Vitamin D, Ergocalciferol, (DRISDOL) 50000 UNITS CAPS Take 50,000 Units by mouth.         Past Medical History  Diagnosis Date    . Coronary artery disease   . Shortness of breath   . Angina   . Pneumonia 12/12/10  . Hiatal hernia   . Arthritis   . GERD (gastroesophageal reflux disease)   . COPD (chronic obstructive pulmonary disease)   . Iron deficiency anemia   . Barrett esophagus   . HTN (hypertension)   . DJD (degenerative joint disease)   . Osteoporosis   . HLD (hyperlipidemia)   . Internal hemorrhoids   . Diverticulosis    Past Surgical History  Procedure Date  . Coronary artery bypass graft   . Back surgery     x 2  . Appendectomy     67yrs old  . Cardiac catheterization     stents  . Total shoulder replacement     right  . Cholecystectomy     several yrs ago  . Cataract extraction 2001, 2002    bilateral  . Total abdominal hysterectomy 1965  . Upper gastrointestinal endoscopy multiple    w/bx, hiatal hernia, Barretts', esophageal polyp 1x  . Colonoscopy 05/09/2004; 06/08/1999    internal hemorrhoids; diverticulosis  . Rotator cuff repair     bilateral  .  Lumbar spine surgery October 2012    spinal stenosis, Dr. Ophelia Charter   History   Social History  . Marital Status: Widowed            .     Occupational History  . retired    Social History Main Topics  . Smoking status: Former Smoker    Types: Cigarettes  . Smokeless tobacco: Never Used  . Alcohol Use: No  . Drug Use: No  . Sexually Active: None     Family History  Problem Relation Age of Onset  . Prostate cancer Father   . Diabetes Mother   . Colon cancer Neg Hx   . Breast cancer Neg Hx   . Heart disease Mother         Review of Systems Neurogenic claudication is much better after surgery for spinal stenosis last Fall    Objective:   Physical Exam General:  NAD Eyes:   anicteric Lungs:  Clear, marked kyphosis the chest wall Heart:  S1S2 no rubs, murmurs or gallops Abdomen:  soft and nontender, BS+              Assessment & Plan:

## 2011-03-23 NOTE — Patient Instructions (Addendum)
You have been scheduled for an Endoscopy with separate instructions given. Take your Prilosec as directed on your medication list.

## 2011-03-23 NOTE — Assessment & Plan Note (Signed)
Having increasing atypical, proximal symptoms with globus and sore throat. Presumably these are from reflux. We'll increase her PPI to twice daily.

## 2011-03-23 NOTE — Assessment & Plan Note (Signed)
3 year followup examination had been planned for February 2013. Given her flare of globus and sore throat, will increase her Prilosec OTC to twice daily and perform upper endoscopy. Given her age and overall findings as may be her last EGD on a routine basis for Barrett's. She may still need ENT evaluation though the chronicity of the symptoms seem to speak to benign process overall.

## 2011-03-30 ENCOUNTER — Ambulatory Visit (AMBULATORY_SURGERY_CENTER): Payer: Medicare Other | Admitting: Internal Medicine

## 2011-03-30 ENCOUNTER — Encounter: Payer: Self-pay | Admitting: Internal Medicine

## 2011-03-30 VITALS — BP 137/68 | HR 73 | Temp 96.8°F | Resp 20 | Ht 63.0 in | Wt 131.0 lb

## 2011-03-30 DIAGNOSIS — K219 Gastro-esophageal reflux disease without esophagitis: Secondary | ICD-10-CM

## 2011-03-30 DIAGNOSIS — K2270 Barrett's esophagus without dysplasia: Secondary | ICD-10-CM

## 2011-03-30 DIAGNOSIS — K449 Diaphragmatic hernia without obstruction or gangrene: Secondary | ICD-10-CM

## 2011-03-30 MED ORDER — SODIUM CHLORIDE 0.9 % IV SOLN: 500.0000 mL | INTRAVENOUS | Status: DC

## 2011-03-30 NOTE — Progress Notes (Signed)
FOLLOW DISCHARGE INSTRUCTIONS (BLUE AND GREEN SHEETS).. 

## 2011-03-30 NOTE — Op Note (Signed)
[facility] [address] [place], [place]  [address]  ENDOSCOPY PROCEDURE REPORT  PATIENT:  Michelle, [name]  MR#:  [id number] BIRTHDATE:  06-Nov-1930, 81 yrs. old  GENDER:  female  ENDOSCOPIST:  Iva Boop, MD, [facility]  PROCEDURE DATE:  03/30/2011 PROCEDURE:  EGD with biopsy, 84132 ASA CLASS:  Class III INDICATIONS:  follow-up of Barrett's Esophagus also having globus and a sore throat  MEDICATIONS:   These medications were titrated to patient response per physician's verbal order, Fentanyl 25 mcg IV, Versed 2 mg IV TOPICAL ANESTHETIC:  Cetacaine Spray  DESCRIPTION OF PROCEDURE:   After the risks benefits and alternatives of the procedure were thoroughly explained, informed consent was obtained.  The LB [id number] endoscope was introduced through the mouth and advanced to the second portion of the duodenum, without limitations.  The instrument was slowly withdrawn as the mucosa was fully examined. <<PROCEDUREIMAGES>>  Barrett's esophagus was found at the gastroesophageal junction. Small area of columnar-appearing mucosa - 3 x 15 mm approximately. Could actually be in cardia. Multiple biopsies were obtained and sent to pathology.  A hiatal hernia was found. It was 5 cm in size.  Otherwise the examination was normal.    Retroflexed views revealed a hiatal hernia.    The scope was then withdrawn from the patient and the procedure completed.  COMPLICATIONS:  None  ENDOSCOPIC IMPRESSION: 1) Barrett's esophagus at the gastroesophageal junction  very small area - could be in cardia and then not Barrett's esophagus 2) 5 cm hiatal hernia 3) Otherwise normal examination RECOMMENDATIONS: Stay on twice daily PPI - if she continues with sore throat and globus after 2 months I recommend she see ENT  REPEAT EXAM:  unlikely to be necessary given age  Iva Boop, MD, [facility]  CC:  The Patient and Chilton Greathouse, MD  n. Rosalie DoctorIva Boop at 03/30/2011  12:45 PM  [name], [phone]

## 2011-03-31 ENCOUNTER — Telehealth: Payer: Self-pay | Admitting: *Deleted

## 2011-03-31 NOTE — Telephone Encounter (Signed)
  Follow up Call-  Call back number 03/30/2011  Post procedure Call Back phone  # [phone]  Permission to leave phone message Yes     Patient questions:  Do you have a fever, pain , or abdominal swelling? no Pain Score  0 *  Have you tolerated food without any problems? yes  Have you been able to return to your normal activities? no  Do you have any questions about your discharge instructions: Diet   no Medications  no Follow up visit  no  Do you have questions or concerns about your Care? no  Actions: * If pain score is 4 or above: No action needed, pain <4.

## 2011-04-04 ENCOUNTER — Encounter: Payer: Self-pay | Admitting: Internal Medicine

## 2011-04-04 NOTE — Progress Notes (Signed)
Quick Note:  Barrett's, no dysplasia Given age and stability - no routine repeat exam required ______

## 2011-04-18 ENCOUNTER — Other Ambulatory Visit (HOSPITAL_COMMUNITY): Payer: Self-pay | Admitting: *Deleted

## 2011-04-20 ENCOUNTER — Ambulatory Visit (HOSPITAL_COMMUNITY)
Admission: RE | Admit: 2011-04-20 | Discharge: 2011-04-20 | Disposition: A | Discharge status: Home / Self Care | Payer: Medicare Other | Source: Ambulatory Visit | Attending: Rheumatology | Admitting: Rheumatology

## 2011-04-20 DIAGNOSIS — M810 Age-related osteoporosis without current pathological fracture: Secondary | ICD-10-CM | POA: Insufficient documentation

## 2011-04-20 MED ORDER — SODIUM CHLORIDE 0.9 % IV SOLN
Freq: Once | INTRAVENOUS | Status: AC
  Administered 2011-04-20: 11:00:00 via INTRAVENOUS

## 2011-04-20 MED ORDER — ZOLEDRONIC ACID 5 MG/100ML IV SOLN
5.0000 mg | Freq: Once | INTRAVENOUS | Status: AC
  Administered 2011-04-20: 5 mg via INTRAVENOUS
  Filled 2011-04-20: qty 100

## 2011-04-20 NOTE — Discharge Instructions (Signed)
Drink  Fluids/water as tolerated over the next 72 hours Tylenol or ibuprofen OTC as directed Continue Calcium and Vit D as directed by your MD  RECLAST  Zoledronic Acid injection (Paget's Disease, Osteoporosis) What is this medicine? ZOLEDRONIC ACID (ZOE le dron ik AS id) lowers the amount of calcium loss from bone. It is used to treat Paget's disease and osteoporosis in women. This medicine may be used for other purposes; ask your health care provider or pharmacist if you have questions. What should I tell my health care provider before I take this medicine? They need to know if you have any of these conditions: -aspirin-sensitive asthma -dental disease -kidney disease -low levels of calcium in the blood -past surgery on the parathyroid gland or intestines -an unusual or allergic reaction to zoledronic acid, other medicines, foods, dyes, or preservatives -pregnant or trying to get pregnant -breast-feeding How should I use this medicine? This medicine is for infusion into a vein. It is given by a health care professional in a hospital or clinic setting. Talk to your pediatrician regarding the use of this medicine in children. This medicine is not approved for use in children. Overdosage: If you think you have taken too much of this medicine contact a poison control center or emergency room at once. NOTE: This medicine is only for you. Do not share this medicine with others. What if I miss a dose? It is important not to miss your dose. Call your doctor or health care professional if you are unable to keep an appointment. What may interact with this medicine? -certain antibiotics given by injection -NSAIDs, medicines for pain and inflammation, like ibuprofen or naproxen -some diuretics like bumetanide, furosemide -teriparatide This list may not describe all possible interactions. Give your health care provider a list of all the medicines, herbs, non-prescription drugs, or dietary  supplements you use. Also tell them if you smoke, drink alcohol, or use illegal drugs. Some items may interact with your medicine. What should I watch for while using this medicine? Visit your doctor or health care professional for regular checkups. It may be some time before you see the benefit from this medicine. Do not stop taking your medicine unless your doctor tells you to. Your doctor may order blood tests or other tests to see how you are doing. Women should inform their doctor if they wish to become pregnant or think they might be pregnant. There is a potential for serious side effects to an unborn child. Talk to your health care professional or pharmacist for more information. You should make sure that you get enough calcium and vitamin D while you are taking this medicine. Discuss the foods you eat and the vitamins you take with your health care professional. Some people who take this medicine have severe bone, joint, and/or muscle pain. This medicine may also increase your risk for a broken thigh bone. Tell your doctor right away if you have pain in your upper leg or groin. Tell your doctor if you have any pain that does not go away or that gets worse. What side effects may I notice from receiving this medicine? Side effects that you should report to your doctor or health care professional as soon as possible: -allergic reactions like skin rash, itching or hives, swelling of the face, lips, or tongue -breathing problems -changes in vision -feeling faint or lightheaded, falls -jaw burning, cramping, or pain -muscle cramps, stiffness, or weakness -trouble passing urine or change in the amount of  urine Side effects that usually do not require medical attention (report to your doctor or health care professional if they continue or are bothersome): -bone, joint, or muscle pain -fever -irritation at site where injected -loss of appetite -nausea, vomiting -stomach upset -tired This list  may not describe all possible side effects. Call your doctor for medical advice about side effects. You may report side effects to FDA at 1-800-FDA-1088. Where should I keep my medicine? This drug is given in a hospital or clinic and will not be stored at home. NOTE: This sheet is a summary. It may not cover all possible information. If you have questions about this medicine, talk to your doctor, pharmacist, or health care provider.  2012, [employer]/Gold Standard. (08/05/2010 9:08:15 AM)

## 2011-04-21 HISTORY — PX: CARPAL TUNNEL RELEASE: SHX101

## 2011-12-15 ENCOUNTER — Ambulatory Visit (INDEPENDENT_AMBULATORY_CARE_PROVIDER_SITE_OTHER): Payer: Medicare Other | Admitting: *Deleted

## 2011-12-15 DIAGNOSIS — M79609 Pain in unspecified limb: Secondary | ICD-10-CM

## 2011-12-15 DIAGNOSIS — M79606 Pain in leg, unspecified: Secondary | ICD-10-CM

## 2012-01-23 ENCOUNTER — Other Ambulatory Visit (HOSPITAL_COMMUNITY): Payer: Self-pay | Admitting: Orthopaedic Surgery

## 2012-01-23 ENCOUNTER — Other Ambulatory Visit (HOSPITAL_COMMUNITY): Payer: Self-pay

## 2012-01-23 ENCOUNTER — Encounter (HOSPITAL_COMMUNITY): Payer: Self-pay | Admitting: Pharmacy Technician

## 2012-01-23 NOTE — Progress Notes (Signed)
Need orders placed in EPIC for surgery 01/26/12.  Preop will be on 01/24/12 at 1100am. Thank You.

## 2012-01-24 ENCOUNTER — Encounter (HOSPITAL_COMMUNITY): Payer: Self-pay

## 2012-01-24 ENCOUNTER — Encounter (HOSPITAL_COMMUNITY)
Admission: RE | Admit: 2012-01-24 | Discharge: 2012-01-24 | Disposition: A | Discharge status: Home / Self Care | Payer: Medicare Other | Source: Ambulatory Visit | Attending: Surgery | Admitting: Surgery

## 2012-01-24 ENCOUNTER — Ambulatory Visit (HOSPITAL_COMMUNITY)
Admission: RE | Admit: 2012-01-24 | Discharge: 2012-01-24 | Disposition: A | Discharge status: Home / Self Care | Payer: Medicare Other | Source: Ambulatory Visit | Attending: Surgery | Admitting: Surgery

## 2012-01-24 DIAGNOSIS — M169 Osteoarthritis of hip, unspecified: Secondary | ICD-10-CM | POA: Insufficient documentation

## 2012-01-24 DIAGNOSIS — M1610 Unilateral primary osteoarthritis, unspecified hip: Secondary | ICD-10-CM | POA: Insufficient documentation

## 2012-01-24 DIAGNOSIS — Z01811 Encounter for preprocedural respiratory examination: Secondary | ICD-10-CM | POA: Insufficient documentation

## 2012-01-24 DIAGNOSIS — K449 Diaphragmatic hernia without obstruction or gangrene: Secondary | ICD-10-CM | POA: Insufficient documentation

## 2012-01-24 LAB — SURGICAL PCR SCREEN: Staphylococcus aureus: POSITIVE — AB

## 2012-01-24 LAB — BASIC METABOLIC PANEL
BUN: 14 mg/dL (ref 6–23)
GFR calc Af Amer: >90 mL/min (ref >90)
GFR calc non Af Amer: 86 mL/min — ABNORMAL LOW (ref >90)
Potassium: 4.3 mEq/L (ref 3.5–5.1)
Sodium: 132 mEq/L — ABNORMAL LOW (ref 135–145)

## 2012-01-24 LAB — URINALYSIS, ROUTINE W REFLEX MICROSCOPIC
Glucose, UA: NEGATIVE mg/dL
Nitrite: POSITIVE — AB
Protein, ur: 30 mg/dL — AB
Urobilinogen, UA: 1 mg/dL (ref 0.0–1.0)

## 2012-01-24 LAB — ABO/RH: ABO/RH(D): A POS

## 2012-01-24 LAB — URINE MICROSCOPIC-ADD ON

## 2012-01-24 LAB — CBC
Hemoglobin: 11.6 g/dL — ABNORMAL LOW (ref 12.0–15.0)
MCHC: 33.1 g/dL (ref 30.0–36.0)
Platelets: 261 10*3/uL (ref 150–400)

## 2012-01-24 LAB — PROTIME-INR: Prothrombin Time: 13.3 seconds (ref 11.6–15.2)

## 2012-01-24 LAB — APTT: aPTT: 44 seconds — ABNORMAL HIGH (ref 24–37)

## 2012-01-24 NOTE — Patient Instructions (Addendum)
YOUR SURGERY IS SCHEDULED AT [facility]  ON:  Friday  12/6  AT 2:44 PM  REPORT TO [place] [facility]ORT STAY [facility] AT:  12:30 PM      PHONE # FOR SHORT STAY IS [phone]  DO NOT EAT ANYTHING AFTER MIDNIGHT THE NIGHT BEFORE YOUR SURGERY.   NO FOOD, NO CHEWING GUM, NO MINTS, NO CANDIES, NO CHEWING TOBACCO. YOU MAY HAVE CLEAR LIQUIDS TO DRINK FROM MIDNIGHT UNTIL 8:30 AM DAY OF SURGERY - LIKE WATER, COFFEE ( NO MILK OR MILK PRODUCTS ), APPLE OR GRAPE JUICE.   NOTHING TO DRINK AFTER 8:30 AM.  PLEASE TAKE THE FOLLOWING MEDICATIONS THE AM OF YOUR SURGERY WITH A FEW SIPS OF WATER:   NORVASC AND PRILOSEC.   MAY TAKE TRAMADOL WITH PHENERGAN IF NEEDED.   IF YOU USE INHALERS--USE YOUR INHALERS THE AM OF YOUR SURGERY AND BRING INHALERS TO THE HOSPITAL -TAKE TO SURGERY.    IF YOU ARE DIABETIC:  DO NOT TAKE ANY DIABETIC MEDICATIONS THE AM OF YOUR SURGERY.  IF YOU TAKE INSULIN IN THE EVENINGS--PLEASE ONLY TAKE 1/2 NORMAL EVENING DOSE THE NIGHT BEFORE YOUR SURGERY.  NO INSULIN THE AM OF YOUR SURGERY.  IF YOU HAVE SLEEP APNEA AND USE CPAP OR BIPAP--PLEASE BRING THE MASK AND THE TUBING.  DO NOT BRING YOUR MACHINE.  DO NOT BRING VALUABLES, MONEY, CREDIT CARDS.  DO NOT WEAR JEWELRY, MAKE-UP, NAIL POLISH AND NO METAL PINS OR CLIPS IN YOUR HAIR. CONTACT LENS, DENTURES / PARTIALS, GLASSES SHOULD NOT BE WORN TO SURGERY AND IN MOST CASES-HEARING AIDS WILL NEED TO BE REMOVED.  BRING YOUR GLASSES CASE, ANY EQUIPMENT NEEDED FOR YOUR CONTACT LENS. FOR PATIENTS ADMITTED TO THE HOSPITAL--CHECK OUT TIME THE DAY OF DISCHARGE IS 11:00 AM.  ALL INPATIENT ROOMS ARE PRIVATE - WITH BATHROOM, TELEPHONE, TELEVISION AND WIFI INTERNET.  IF YOU ARE BEING DISCHARGED THE SAME DAY OF YOUR SURGERY--YOU CAN NOT DRIVE YOURSELF HOME--AND SHOULD NOT GO HOME ALONE BY TAXI OR BUS.  NO DRIVING OR OPERATING MACHINERY FOR 24 HOURS FOLLOWING ANESTHESIA / PAIN MEDICATIONS.  PLEASE MAKE ARRANGEMENTS FOR SOMEONE TO BE WITH YOU AT HOME THE FIRST  24 HOURS AFTER SURGERY. RESPONSIBLE DRIVER'S NAME___________________________                                               PHONE #   _______________________                                  PLEASE READ OVER ANY  FACT SHEETS THAT YOU WERE GIVEN: MRSA INFORMATION, BLOOD TRANSFUSION INFORMATION

## 2012-01-24 NOTE — Pre-Procedure Instructions (Signed)
PT'S URINALYSIS, URINE MICROSCOPIC, PT, PTT, BMET REPORTS WERE FAXED TO DR. C. BLACKMAN'S OFFICE FOR REVIEW OF ABNORMALS AND DR. Alben Spittle SURGERY SCHEDULER SHERRY NOTIFIED OF FAX--AND URINALYSIS SHOWS PROBABLE UTI. SOME OF PT'S LABS SHOW DR. Barrie Dunker AS SURGEON INSTEAD OF DR. C. BLACKMAN--ADMITTING ARRIVED PT TO WRONG DR. Magnus Ivan TODAY--I HAVE NOTIFIED ADMITTING AND PT NOW CORRECTLY ARRIVED TO DR. Maureen Ralphs.

## 2012-01-24 NOTE — Progress Notes (Signed)
01/24/12 1141  OBSTRUCTIVE SLEEP APNEA  Have you ever been diagnosed with sleep apnea through a sleep study? No  Do you snore loudly (loud enough to be heard through closed doors)?  1  Do you often feel tired, fatigued, or sleepy during the daytime? 1  Has anyone observed you stop breathing during your sleep? 1  Do you have, or are you being treated for high blood pressure? 1  BMI more than 35 kg/m2? 0  Age over 76 years old? 1  Neck circumference greater than 40 cm/18 inches? 0  Gender: 0  Obstructive Sleep Apnea Score 5   Score 4 or greater  Results sent to PCP

## 2012-01-24 NOTE — Pre-Procedure Instructions (Addendum)
PT HAS EKG REPORT 05/02/11 WITH CARDIOLOGY OFFICE NOTE - FROM DR. A. LITTLE. CBC, BMET, PT, PTT, UA, T/S, CXR WERE DONE TODAY PREOP - AT [facility] AS PER ORDERS DR. Maureen Ralphs AND ANESTHESIOLOGIST'S GUIDELINES.

## 2012-01-26 ENCOUNTER — Inpatient Hospital Stay (HOSPITAL_COMMUNITY): Payer: Medicare Other

## 2012-01-26 ENCOUNTER — Encounter (HOSPITAL_COMMUNITY): Payer: Self-pay | Admitting: Anesthesiology

## 2012-01-26 ENCOUNTER — Encounter (HOSPITAL_COMMUNITY): Payer: Self-pay | Admitting: *Deleted

## 2012-01-26 ENCOUNTER — Inpatient Hospital Stay (HOSPITAL_COMMUNITY)
Admission: RE | Admit: 2012-01-26 | Discharge: 2012-01-30 | DRG: 470 | Disposition: A | Discharge status: Skilled Nursing Facility | Payer: Medicare Other | Source: Ambulatory Visit | Attending: Orthopaedic Surgery | Admitting: Orthopaedic Surgery

## 2012-01-26 ENCOUNTER — Inpatient Hospital Stay (HOSPITAL_COMMUNITY): Payer: Medicare Other | Admitting: Anesthesiology

## 2012-01-26 ENCOUNTER — Encounter (HOSPITAL_COMMUNITY)
Admission: RE | Disposition: A | Discharge status: Skilled Nursing Facility | Payer: Self-pay | Source: Ambulatory Visit | Attending: Orthopaedic Surgery

## 2012-01-26 ENCOUNTER — Encounter (HOSPITAL_COMMUNITY): Payer: Self-pay | Admitting: Orthopedic Surgery

## 2012-01-26 DIAGNOSIS — K449 Diaphragmatic hernia without obstruction or gangrene: Secondary | ICD-10-CM | POA: Diagnosis present

## 2012-01-26 DIAGNOSIS — D649 Anemia, unspecified: Secondary | ICD-10-CM | POA: Diagnosis not present

## 2012-01-26 DIAGNOSIS — I2510 Atherosclerotic heart disease of native coronary artery without angina pectoris: Secondary | ICD-10-CM | POA: Diagnosis present

## 2012-01-26 DIAGNOSIS — I10 Essential (primary) hypertension: Secondary | ICD-10-CM | POA: Diagnosis present

## 2012-01-26 DIAGNOSIS — R339 Retention of urine, unspecified: Secondary | ICD-10-CM | POA: Diagnosis not present

## 2012-01-26 DIAGNOSIS — J4489 Other specified chronic obstructive pulmonary disease: Secondary | ICD-10-CM | POA: Diagnosis present

## 2012-01-26 DIAGNOSIS — M1610 Unilateral primary osteoarthritis, unspecified hip: Principal | ICD-10-CM | POA: Diagnosis present

## 2012-01-26 DIAGNOSIS — M169 Osteoarthritis of hip, unspecified: Secondary | ICD-10-CM

## 2012-01-26 DIAGNOSIS — Z9861 Coronary angioplasty status: Secondary | ICD-10-CM

## 2012-01-26 DIAGNOSIS — J449 Chronic obstructive pulmonary disease, unspecified: Secondary | ICD-10-CM | POA: Diagnosis present

## 2012-01-26 DIAGNOSIS — K219 Gastro-esophageal reflux disease without esophagitis: Secondary | ICD-10-CM | POA: Diagnosis present

## 2012-01-26 HISTORY — PX: TOTAL HIP ARTHROPLASTY: SHX124

## 2012-01-26 LAB — URINE CULTURE: Colony Count: >=100000

## 2012-01-26 SURGERY — ARTHROPLASTY, HIP, TOTAL, ANTERIOR APPROACH
Anesthesia: General | Site: Hip | Laterality: Left | Wound class: Clean

## 2012-01-26 MED ORDER — METOCLOPRAMIDE HCL 5 MG/ML IJ SOLN: 5.0000 mg | Freq: Three times a day (TID) | INTRAMUSCULAR | Status: DC | PRN

## 2012-01-26 MED ORDER — ONDANSETRON HCL 4 MG/2ML IJ SOLN
4.0000 mg | Freq: Four times a day (QID) | INTRAMUSCULAR | Status: DC | PRN
  Administered 2012-01-27: 4 mg via INTRAVENOUS
  Filled 2012-01-26: qty 2

## 2012-01-26 MED ORDER — ONDANSETRON HCL 4 MG/2ML IJ SOLN
INTRAMUSCULAR | Status: DC | PRN
  Administered 2012-01-26: 4 mg via INTRAVENOUS

## 2012-01-26 MED ORDER — ACETAMINOPHEN 10 MG/ML IV SOLN: 1000.0000 mg | Freq: Once | INTRAVENOUS | Status: DC | PRN

## 2012-01-26 MED ORDER — AMLODIPINE BESYLATE 5 MG PO TABS
5.0000 mg | ORAL_TABLET | Freq: Every day | ORAL | Status: DC
  Administered 2012-01-27 – 2012-01-30 (×4): 5 mg via ORAL
  Filled 2012-01-26 (×4): qty 1

## 2012-01-26 MED ORDER — DOCUSATE SODIUM 100 MG PO CAPS
100.0000 mg | ORAL_CAPSULE | Freq: Two times a day (BID) | ORAL | Status: DC
  Administered 2012-01-26 – 2012-01-30 (×8): 100 mg via ORAL

## 2012-01-26 MED ORDER — METHOCARBAMOL 500 MG PO TABS: 500.0000 mg | ORAL_TABLET | Freq: Four times a day (QID) | ORAL | Status: DC | PRN

## 2012-01-26 MED ORDER — METHOCARBAMOL 100 MG/ML IJ SOLN
500.0000 mg | Freq: Four times a day (QID) | INTRAMUSCULAR | Status: DC | PRN
  Administered 2012-01-26: 500 mg via INTRAVENOUS
  Filled 2012-01-26: qty 5

## 2012-01-26 MED ORDER — HYDROXYCHLOROQUINE SULFATE 200 MG PO TABS
200.0000 mg | ORAL_TABLET | Freq: Every day | ORAL | Status: DC
  Administered 2012-01-27 – 2012-01-30 (×4): 200 mg via ORAL
  Filled 2012-01-26 (×4): qty 1

## 2012-01-26 MED ORDER — ACETAMINOPHEN 10 MG/ML IV SOLN
INTRAVENOUS | Status: DC | PRN
  Administered 2012-01-26: 1000 mg via INTRAVENOUS

## 2012-01-26 MED ORDER — HYDROMORPHONE HCL PF 1 MG/ML IJ SOLN: 0.5000 mg | INTRAMUSCULAR | Status: DC | PRN

## 2012-01-26 MED ORDER — PROMETHAZINE HCL 25 MG/ML IJ SOLN
6.2500 mg | INTRAMUSCULAR | Status: DC | PRN
  Administered 2012-01-26: 12.5 mg via INTRAVENOUS

## 2012-01-26 MED ORDER — ONDANSETRON HCL 4 MG PO TABS
4.0000 mg | ORAL_TABLET | Freq: Four times a day (QID) | ORAL | Status: DC | PRN
  Administered 2012-01-27: 4 mg via ORAL
  Filled 2012-01-26: qty 1

## 2012-01-26 MED ORDER — CISATRACURIUM BESYLATE (PF) 10 MG/5ML IV SOLN
INTRAVENOUS | Status: DC | PRN
  Administered 2012-01-26: 2 mg via INTRAVENOUS
  Administered 2012-01-26: 6 mg via INTRAVENOUS

## 2012-01-26 MED ORDER — KETOROLAC TROMETHAMINE 15 MG/ML IJ SOLN
7.5000 mg | Freq: Four times a day (QID) | INTRAMUSCULAR | Status: AC
  Administered 2012-01-26 – 2012-01-27 (×4): 7.5 mg via INTRAVENOUS
  Filled 2012-01-26 (×4): qty 1

## 2012-01-26 MED ORDER — OXYCODONE HCL 5 MG PO TABS: 5.0000 mg | ORAL_TABLET | ORAL | Status: DC | PRN

## 2012-01-26 MED ORDER — LACTATED RINGERS IV SOLN
INTRAVENOUS | Status: DC
  Administered 2012-01-26: 16:00:00 via INTRAVENOUS
  Administered 2012-01-26: 1000 mL via INTRAVENOUS

## 2012-01-26 MED ORDER — 0.9 % SODIUM CHLORIDE (POUR BTL) OPTIME
TOPICAL | Status: DC | PRN
  Administered 2012-01-26: 1000 mL

## 2012-01-26 MED ORDER — PROMETHAZINE HCL 25 MG/ML IJ SOLN
INTRAMUSCULAR | Status: AC
  Filled 2012-01-26: qty 1

## 2012-01-26 MED ORDER — METOCLOPRAMIDE HCL 5 MG PO TABS
5.0000 mg | ORAL_TABLET | Freq: Three times a day (TID) | ORAL | Status: DC | PRN
  Filled 2012-01-26: qty 2

## 2012-01-26 MED ORDER — CLINDAMYCIN PHOSPHATE 600 MG/50ML IV SOLN
600.0000 mg | Freq: Four times a day (QID) | INTRAVENOUS | Status: AC
  Administered 2012-01-26 – 2012-01-27 (×2): 600 mg via INTRAVENOUS
  Filled 2012-01-26 (×2): qty 50

## 2012-01-26 MED ORDER — CLINDAMYCIN PHOSPHATE 900 MG/50ML IV SOLN
900.0000 mg | INTRAVENOUS | Status: AC
  Administered 2012-01-26: 900 mg via INTRAVENOUS
  Filled 2012-01-26: qty 50

## 2012-01-26 MED ORDER — PROPOFOL 10 MG/ML IV BOLUS
INTRAVENOUS | Status: DC | PRN
  Administered 2012-01-26: 130 mg via INTRAVENOUS

## 2012-01-26 MED ORDER — ACETAMINOPHEN 325 MG PO TABS: 650.0000 mg | ORAL_TABLET | Freq: Four times a day (QID) | ORAL | Status: DC | PRN

## 2012-01-26 MED ORDER — TRAMADOL HCL 50 MG PO TABS
50.0000 mg | ORAL_TABLET | Freq: Four times a day (QID) | ORAL | Status: DC
  Administered 2012-01-26 – 2012-01-30 (×11): 50 mg via ORAL
  Filled 2012-01-26 (×6): qty 1
  Filled 2012-01-26: qty 2
  Filled 2012-01-26 (×18): qty 1

## 2012-01-26 MED ORDER — VITAMIN C 500 MG PO TABS
500.0000 mg | ORAL_TABLET | Freq: Every day | ORAL | Status: DC
  Administered 2012-01-26 – 2012-01-30 (×5): 500 mg via ORAL
  Filled 2012-01-26 (×6): qty 1

## 2012-01-26 MED ORDER — FENTANYL CITRATE 0.05 MG/ML IJ SOLN
INTRAMUSCULAR | Status: AC
  Filled 2012-01-26: qty 2

## 2012-01-26 MED ORDER — OMEPRAZOLE MAGNESIUM 20 MG PO TBEC: 20.0000 mg | DELAYED_RELEASE_TABLET | Freq: Every day | ORAL | Status: DC

## 2012-01-26 MED ORDER — SUCCINYLCHOLINE CHLORIDE 20 MG/ML IJ SOLN
INTRAMUSCULAR | Status: DC | PRN
  Administered 2012-01-26: 100 mg via INTRAVENOUS

## 2012-01-26 MED ORDER — PHENOL 1.4 % MT LIQD
1.0000 | OROMUCOSAL | Status: DC | PRN
  Filled 2012-01-26: qty 177

## 2012-01-26 MED ORDER — EPHEDRINE SULFATE 50 MG/ML IJ SOLN
INTRAMUSCULAR | Status: DC | PRN
  Administered 2012-01-26: 10 mg via INTRAVENOUS

## 2012-01-26 MED ORDER — PANTOPRAZOLE SODIUM 40 MG PO TBEC
40.0000 mg | DELAYED_RELEASE_TABLET | Freq: Every day | ORAL | Status: DC
  Administered 2012-01-27 – 2012-01-28 (×2): 40 mg via ORAL
  Filled 2012-01-26 (×2): qty 1

## 2012-01-26 MED ORDER — SODIUM CHLORIDE 0.9 % IV SOLN
INTRAVENOUS | Status: DC
  Administered 2012-01-26: 75 mL/h via INTRAVENOUS
  Administered 2012-01-27: 20:00:00 via INTRAVENOUS
  Administered 2012-01-28: 75 mL/h via INTRAVENOUS
  Administered 2012-01-28: 20 mL/h via INTRAVENOUS

## 2012-01-26 MED ORDER — ASPIRIN EC 325 MG PO TBEC
325.0000 mg | DELAYED_RELEASE_TABLET | Freq: Every day | ORAL | Status: DC
  Administered 2012-01-27 – 2012-01-30 (×4): 325 mg via ORAL
  Filled 2012-01-26 (×5): qty 1

## 2012-01-26 MED ORDER — GLYCOPYRROLATE 0.2 MG/ML IJ SOLN
INTRAMUSCULAR | Status: DC | PRN
  Administered 2012-01-26: 0.4 mg via INTRAVENOUS

## 2012-01-26 MED ORDER — MENTHOL 3 MG MT LOZG
1.0000 | LOZENGE | OROMUCOSAL | Status: DC | PRN
  Filled 2012-01-26: qty 9

## 2012-01-26 MED ORDER — FENTANYL CITRATE 0.05 MG/ML IJ SOLN
25.0000 ug | INTRAMUSCULAR | Status: DC | PRN
  Administered 2012-01-26 (×3): 50 ug via INTRAVENOUS

## 2012-01-26 MED ORDER — DIPHENHYDRAMINE HCL 12.5 MG/5ML PO ELIX
12.5000 mg | ORAL_SOLUTION | ORAL | Status: DC | PRN
  Administered 2012-01-27 (×2): 12.5 mg via ORAL
  Filled 2012-01-26 (×2): qty 5

## 2012-01-26 MED ORDER — NEOSTIGMINE METHYLSULFATE 1 MG/ML IJ SOLN
INTRAMUSCULAR | Status: DC | PRN
  Administered 2012-01-26: 3 mg via INTRAVENOUS

## 2012-01-26 MED ORDER — ACETAMINOPHEN 650 MG RE SUPP: 650.0000 mg | Freq: Four times a day (QID) | RECTAL | Status: DC | PRN

## 2012-01-26 MED ORDER — FENTANYL CITRATE 0.05 MG/ML IJ SOLN
INTRAMUSCULAR | Status: DC | PRN
  Administered 2012-01-26: 50 ug via INTRAVENOUS
  Administered 2012-01-26: 100 ug via INTRAVENOUS

## 2012-01-26 MED ORDER — ALUM & MAG HYDROXIDE-SIMETH 200-200-20 MG/5ML PO SUSP
30.0000 mL | ORAL | Status: DC | PRN
  Administered 2012-01-29 (×2): 30 mL via ORAL
  Filled 2012-01-26 (×2): qty 30

## 2012-01-26 MED ORDER — PROMETHAZINE HCL 12.5 MG PO TABS
12.5000 mg | ORAL_TABLET | Freq: Four times a day (QID) | ORAL | Status: DC | PRN
  Administered 2012-01-26 – 2012-01-30 (×6): 12.5 mg via ORAL
  Filled 2012-01-26 (×6): qty 1

## 2012-01-26 MED ORDER — ZOLPIDEM TARTRATE 5 MG PO TABS: 5.0000 mg | ORAL_TABLET | Freq: Every evening | ORAL | Status: DC | PRN

## 2012-01-26 MED ORDER — FLUCONAZOLE 150 MG PO TABS
150.0000 mg | ORAL_TABLET | Freq: Once | ORAL | Status: AC
  Administered 2012-01-26: 150 mg via ORAL
  Filled 2012-01-26 (×2): qty 1

## 2012-01-26 SURGICAL SUPPLY — 37 items
BAG SPEC THK2 15X12 ZIP CLS (MISCELLANEOUS) ×2
BAG ZIPLOCK 12X15 (MISCELLANEOUS) ×4 IMPLANT
BLADE SAW SGTL 18X1.27X75 (BLADE) ×2 IMPLANT
CELLS DAT CNTRL 66122 CELL SVR (MISCELLANEOUS) ×1 IMPLANT
CLOTH BEACON ORANGE TIMEOUT ST (SAFETY) ×2 IMPLANT
DRAPE C-ARM 42X72 X-RAY (DRAPES) ×2 IMPLANT
DRAPE STERI IOBAN 125X83 (DRAPES) ×2 IMPLANT
DRAPE U-SHAPE 47X51 STRL (DRAPES) ×6 IMPLANT
DRSG MEPILEX BORDER 4X8 (GAUZE/BANDAGES/DRESSINGS) ×2 IMPLANT
DURAPREP 26ML APPLICATOR (WOUND CARE) ×2 IMPLANT
ELECT BLADE TIP CTD 4 INCH (ELECTRODE) ×2 IMPLANT
ELECT REM PT RETURN 9FT ADLT (ELECTROSURGICAL) ×2
ELECTRODE REM PT RTRN 9FT ADLT (ELECTROSURGICAL) ×1 IMPLANT
FACESHIELD LNG OPTICON STERILE (SAFETY) ×8 IMPLANT
GAUZE XEROFORM 1X8 LF (GAUZE/BANDAGES/DRESSINGS) ×2 IMPLANT
GLOVE BIO SURGEON STRL SZ7 (GLOVE) ×2 IMPLANT
GLOVE BIO SURGEON STRL SZ7.5 (GLOVE) ×2 IMPLANT
GLOVE BIOGEL PI IND STRL 7.5 (GLOVE) IMPLANT
GLOVE BIOGEL PI IND STRL 8 (GLOVE) ×1 IMPLANT
GLOVE BIOGEL PI INDICATOR 7.5 (GLOVE)
GLOVE BIOGEL PI INDICATOR 8 (GLOVE) ×1
GLOVE ECLIPSE 7.0 STRL STRAW (GLOVE) ×2 IMPLANT
GOWN STRL REIN XL XLG (GOWN DISPOSABLE) ×4 IMPLANT
KIT BASIN OR (CUSTOM PROCEDURE TRAY) ×2 IMPLANT
PACK TOTAL JOINT (CUSTOM PROCEDURE TRAY) ×2 IMPLANT
PADDING CAST COTTON 6X4 STRL (CAST SUPPLIES) ×2 IMPLANT
RETRACTOR WND ALEXIS 18 MED (MISCELLANEOUS) IMPLANT
RTRCTR WOUND ALEXIS 18CM MED (MISCELLANEOUS) ×2
STAPLER VISISTAT 35W (STAPLE) IMPLANT
SUT ETHIBOND NAB CT1 #1 30IN (SUTURE) ×4 IMPLANT
SUT VIC AB 1 CT1 36 (SUTURE) ×4 IMPLANT
SUT VIC AB 2-0 CT1 27 (SUTURE) ×4
SUT VIC AB 2-0 CT1 TAPERPNT 27 (SUTURE) ×2 IMPLANT
SUT VLOC 180 0 24IN GS25 (SUTURE) ×1 IMPLANT
TOWEL OR 17X26 10 PK STRL BLUE (TOWEL DISPOSABLE) ×4 IMPLANT
TOWEL OR NON WOVEN STRL DISP B (DISPOSABLE) ×2 IMPLANT
TRAY FOLEY CATH 14FRSI W/METER (CATHETERS) ×2 IMPLANT

## 2012-01-26 NOTE — H&P (Signed)
TOTAL HIP ADMISSION H&P  Patient is admitted for left total hip arthroplasty.  Subjective:  Chief Complaint: left hip pain  HPI: [name], 78 y.o. female, has a history of pain and functional disability in the left hip(s) due to arthritis and patient has failed non-surgical conservative treatments for greater than 12 weeks to include NSAID's and/or analgesics, use of assistive devices, weight reduction as appropriate and activity modification.  Onset of symptoms was gradual starting 5 years ago with gradually worsening course since that time.The patient noted no past surgery on the left hip(s).  Patient currently rates pain in the left hip at 6 out of 10 with activity. Patient has night pain, worsening of pain with activity and weight bearing, trendelenberg gait, pain that interfers with activities of daily living, pain with passive range of motion and crepitus. Patient has evidence of subchondral cysts, subchondral sclerosis, periarticular osteophytes and joint space narrowing by imaging studies. This condition presents safety issues increasing the risk of falls.  There is no current active infection.  Patient Active Problem List   Diagnosis Date Noted  . Degenerative arthritis of hip 01/26/2012  . GERD (gastroesophageal reflux disease) 03/23/2011  . BARRETTS ESOPHAGUS 07/26/2009  . HOARSENESS, CHRONIC 07/26/2009   Past Medical History  Diagnosis Date  . Angina   . Pneumonia 12/12/10  . Hiatal hernia   . Arthritis   . GERD (gastroesophageal reflux disease)   . COPD (chronic obstructive pulmonary disease)     pt. denies having this  . Iron deficiency anemia   . Barrett esophagus   . HTN (hypertension)   . DJD (degenerative joint disease)   . Osteoporosis   . HLD (hyperlipidemia)   . Internal hemorrhoids   . Diverticulosis   . Shortness of breath     MILD- WITH EXERTION  . Pain     RIGHT SHOULDER WITH LIMITED ROM--STATES PREVIOUS RT TOTAL SHOULDDER REPLACEMENT-BUT STILL HAS  PROBLEMS WITH SHOULDER  . Coronary artery disease     Past Surgical History  Procedure Date  . Back surgery     x 2  . Appendectomy     25yrs old  . Cardiac catheterization     stents  . Total shoulder replacement     right  . Cholecystectomy     several yrs ago  . Cataract extraction 2001, 2002    bilateral  . Total abdominal hysterectomy 1965  . Upper gastrointestinal endoscopy multiple    w/bx, hiatal hernia, Barretts', esophageal polyp 1x  . Colonoscopy 05/09/2004; 06/08/1999    internal hemorrhoids; diverticulosis  . Rotator cuff repair     bilateral  . Lumbar spine surgery October 2012    spinal stenosis, Dr. Ophelia Charter  . Abdominal aortic aneurysm repair 2004  . Coronary angioplasty 2004     STENT PLACEMENT RT CORONARY ARTERY AND CIRCUMFLEX ARTERY  . Left carpal tunnel release march 2013     No prescriptions prior to admission   Allergies  Allergen Reactions  . Codeine Nausea And Vomiting  . Hydrocodone Nausea And Vomiting  . Morphine And Related Other (See Comments)    hallucinations  . Other Nausea And Vomiting, Swelling and Rash    ALL PAIN MEDICATIONS BESIDES TRAMADOL  PT STATES SHE NEEDS PAPER TAPE--SKIN VERY FRAGILE  . Penicillins Hives  . Darvocet (Propoxyphene-Acetaminophen)   . Demerol (Meperidine)   . Statins     INTOLERANT OF STATINS    History  Substance Use Topics  . Smoking status: Former Smoker  Types: Cigarettes    Quit date: 04/20/2000  . Smokeless tobacco: Never Used  . Alcohol Use: No    Family History  Problem Relation Age of Onset  . Prostate cancer Father   . Diabetes Mother   . Colon cancer Neg Hx   . Breast cancer Mother   . Heart disease Mother      Review of Systems  Musculoskeletal: Positive for joint pain.  All other systems reviewed and are negative.    Objective:  Physical Exam  Constitutional: She is oriented to person, place, and time. She appears well-developed and well-nourished.  HENT:  Head:  Normocephalic and atraumatic.  Eyes: EOM are normal. Pupils are equal, round, and reactive to light.  Neck: Normal range of motion. Neck supple.  Cardiovascular: Normal rate and regular rhythm.   Respiratory: Effort normal and breath sounds normal.  GI: Soft. Bowel sounds are normal.  Musculoskeletal:       Left hip: She exhibits decreased range of motion, decreased strength and bony tenderness.  Neurological: She is alert and oriented to person, place, and time.  Skin: Skin is warm and dry.  Psychiatric: She has a normal mood and affect.    Vital signs in last 24 hours:    Labs:   Estimated Body mass index is 22.67 kg/(m^2) as calculated from the following:   Height as of 03/30/11: 5\' 3" (1.6 m).   Weight as of 04/20/11: 128 lb(58.06 kg).   Imaging Review Plain radiographs demonstrate severe degenerative joint disease of the left hip(s). The bone quality appears to be fair for age and reported activity level.  Assessment/Plan:  End stage arthritis, left hip(s)  The patient history, physical examination, clinical judgement of the provider and imaging studies are consistent with end stage degenerative joint disease of the left hip(s) and total hip arthroplasty is deemed medically necessary. The treatment options including medical management, injection therapy, arthroscopy and arthroplasty were discussed at length. The risks and benefits of total hip arthroplasty were presented and reviewed. The risks due to aseptic loosening, infection, stiffness, dislocation/subluxation,  thromboembolic complications and other imponderables were discussed.  The patient acknowledged the explanation, agreed to proceed with the plan and consent was signed. Patient is being admitted for inpatient treatment for surgery, pain control, PT, OT, prophylactic antibiotics, VTE prophylaxis, progressive ambulation and ADL's and discharge planning.The patient is planning to be discharged home with home health services

## 2012-01-26 NOTE — Brief Op Note (Signed)
01/26/2012  5:17 PM  PATIENT:  [name]  78 y.o. female  PRE-OPERATIVE DIAGNOSIS:  Severe osteoarthritis left hip  POST-OPERATIVE DIAGNOSIS:  Severe osteoarthritis left hip  PROCEDURE:  Procedure(s) (LRB) with comments: TOTAL HIP ARTHROPLASTY ANTERIOR APPROACH (Left) - Left Total Hip Arthroplasty, Anterior Approach (C-Arm)  SURGEON:  Surgeon(s) and Role:    * Kathryne Hitch, MD - Primary  PHYSICIAN ASSISTANT:   ASSISTANTS: none   ANESTHESIA:   general  EBL:  Total I/O In: 2000 [I.V.:2000] Out: 450 [Urine:200; Blood:250]  BLOOD ADMINISTERED:none  DRAINS: none   LOCAL MEDICATIONS USED:  NONE  SPECIMEN:  No Specimen  DISPOSITION OF SPECIMEN:  N/A  COUNTS:  YES  TOURNIQUET:  * No tourniquets in log *  DICTATION: .Other Dictation: Dictation Number [id number]  PLAN OF CARE: Admit to inpatient   PATIENT DISPOSITION:  PACU - hemodynamically stable.   Delay start of Pharmacological VTE agent (>24hrs) due to surgical blood loss or risk of bleeding: no

## 2012-01-26 NOTE — Anesthesia Postprocedure Evaluation (Signed)
Anesthesia Post Note  Patient: [name]  Procedure(s) Performed: Procedure(s) (LRB): TOTAL HIP ARTHROPLASTY ANTERIOR APPROACH (Left)  Anesthesia type: General  Patient location: PACU  Post pain: Pain level controlled  Post assessment: Post-op Vital signs reviewed  Last Vitals: BP 111/68  Pulse 79  Temp 36.4 C (Oral)  Resp 12  SpO2 97%  Post vital signs: Reviewed  Level of consciousness: sedated  Complications: No apparent anesthesia complications

## 2012-01-26 NOTE — Anesthesia Preprocedure Evaluation (Addendum)
Anesthesia Evaluation  Patient identified by MRN, date of birth, ID band Patient awake    Reviewed: Allergy & Precautions, H&P , NPO status , Patient's Chart, lab work & pertinent test results  Airway Mallampati: II TM Distance: >3 FB Neck ROM: Full    Dental  (+) Dental Advisory Given, Edentulous Upper and Edentulous Lower   Pulmonary shortness of breath and with exertion, pneumonia -, resolved, COPD COPD inhaler,  breath sounds clear to auscultation  Pulmonary exam normal       Cardiovascular hypertension, Pt. on medications + angina + CAD, + Cardiac Stents and + Peripheral Vascular Disease Rhythm:Regular Rate:Normal     Neuro/Psych negative psych ROS   GI/Hepatic Neg liver ROS, hiatal hernia, GERD-  Medicated,  Endo/Other  negative endocrine ROS  Renal/GU negative Renal ROS     Musculoskeletal negative musculoskeletal ROS (+)   Abdominal   Peds  Hematology negative hematology ROS (+)   Anesthesia Other Findings   Reproductive/Obstetrics                          Anesthesia Physical Anesthesia Plan  ASA: III  Anesthesia Plan: General   Post-op Pain Management:    Induction: Intravenous  Airway Management Planned: Oral ETT  Additional Equipment:   Intra-op Plan:   Post-operative Plan: Extubation in OR  Informed Consent: I have reviewed the patients History and Physical, chart, labs and discussed the procedure including the risks, benefits and alternatives for the proposed anesthesia with the patient or authorized representative who has indicated his/her understanding and acceptance.   Dental advisory given  Plan Discussed with: CRNA  Anesthesia Plan Comments:         Anesthesia Quick Evaluation

## 2012-01-26 NOTE — Transfer of Care (Signed)
Immediate Anesthesia Transfer of Care Note  Patient: [name]  Procedure(s) Performed: Procedure(s) (LRB) with comments: TOTAL HIP ARTHROPLASTY ANTERIOR APPROACH (Left) - Left Total Hip Arthroplasty, Anterior Approach (C-Arm)  Patient Location: PACU  Anesthesia Type:General  Level of Consciousness: awake, sedated and patient cooperative  Airway & Oxygen Therapy: Patient Spontanous Breathing and Patient connected to face mask oxygen  Post-op Assessment: Report given to PACU RN and Post -op Vital signs reviewed and stable  Post vital signs: Reviewed and stable  Complications: No apparent anesthesia complications

## 2012-01-27 LAB — CBC
HCT: 26.9 % — ABNORMAL LOW (ref 36.0–46.0)
MCV: 93.4 fL (ref 78.0–100.0)
RBC: 2.88 MIL/uL — ABNORMAL LOW (ref 3.87–5.11)
WBC: 8.1 10*3/uL (ref 4.0–10.5)

## 2012-01-27 LAB — BASIC METABOLIC PANEL
BUN: 8 mg/dL (ref 6–23)
CO2: 29 mEq/L (ref 19–32)
Chloride: 105 mEq/L (ref 96–112)
Creatinine, Ser: 0.58 mg/dL (ref 0.50–1.10)

## 2012-01-27 NOTE — Progress Notes (Signed)
Physical Therapy Treatment Patient Details Name: [name] MRN: [id number] DOB: March 18, 1930 Today's Date: 01/27/2012 Time: 4098-1191 PT Time Calculation (min): 19 min  PT Assessment / Plan / Recommendation Comments on Treatment Session  Pt very fatigued this session and noted some increased safety concerns for mobility.  RN states that it is probably due to pain medication.  Also noted that pt was very ithcy throughout session.  Pt is being given Benadryl.  Attempted to perform exercises, however pt too fatigued to complete them.     Follow Up Recommendations  SNF;Supervision/Assistance - 24 hour     Does the patient have the potential to tolerate intense rehabilitation     Barriers to Discharge        Equipment Recommendations  None recommended by PT;Tub/shower bench;Other (comment)    Recommendations for Other Services    Frequency 7X/week   Plan Discharge plan remains appropriate    Precautions / Restrictions Precautions Precautions: Fall Precaution Comments: no c/o dizziness during BID tx.  Restrictions Weight Bearing Restrictions: No LLE Weight Bearing: Weight bearing as tolerated   Pertinent Vitals/Pain No pain    Mobility  Bed Mobility Bed Mobility: Sit to Supine Sit to Supine: 3: Mod assist Details for Bed Mobility Assistance: Assist for BLE into bed with cues for adjusting hips once in bed.  Transfers Transfers: Sit to Stand;Stand to Sit Sit to Stand: 4: Min assist;3: Mod assist;With upper extremity assist;With armrests;From chair/3-in-1 Stand to Sit: 4: Min assist;3: Mod assist;With upper extremity assist;With armrests;To bed;To chair/3-in-1 Details for Transfer Assistance: Performed x 2 for seated rest break when ambulating.  Max cues for safety, hand placement and technique.  Requires increased cues for safety this afternoon.  Ambulation/Gait Ambulation/Gait Assistance: 1: +2 Total assist Ambulation/Gait: Patient Percentage: 70% Ambulation Distance  (Feet): 50 Feet Assistive device: Rolling walker Ambulation/Gait Assistance Details: Max cues for sequencing/technique with RW and safety.  Gait Pattern: Step-to pattern;Decreased stride length;Trunk flexed Gait velocity: decreased    Exercises Total Joint Exercises Ankle Circles/Pumps: AROM;Both;15 reps Quad Sets: AROM;Left;5 reps   PT Diagnosis:    PT Problem List:   PT Treatment Interventions:     PT Goals Acute Rehab PT Goals PT Goal Formulation: With patient Time For Goal Achievement: 01/31/12 Potential to Achieve Goals: Good Pt will go Sit to Supine/Side: with supervision PT Goal: Sit to Supine/Side - Progress: Progressing toward goal Pt will go Sit to Stand: with supervision PT Goal: Sit to Stand - Progress: Progressing toward goal Pt will go Stand to Sit: with supervision PT Goal: Stand to Sit - Progress: Progressing toward goal Pt will Ambulate: 51 - 150 feet;with supervision;with least restrictive assistive device PT Goal: Ambulate - Progress: Progressing toward goal Pt will Perform Home Exercise Program: with supervision, verbal cues required/provided PT Goal: Perform Home Exercise Program - Progress: Progressing toward goal  Visit Information  Last PT Received On: 01/27/12 Assistance Needed: +2    Subjective Data  Subjective: I'm so itchy. Patient Stated Goal: to go to rehab   Cognition  Overall Cognitive Status: Impaired Area of Impairment: Following commands;Safety/judgement;Awareness of errors Arousal/Alertness: Awake/alert Orientation Level: Appears intact for tasks assessed Behavior During Session: [facility] for tasks performed Following Commands: Follows one step commands inconsistently Safety/Judgement: Impulsive;Decreased awareness of safety precautions;Decreased safety judgement for tasks assessed Awareness of Errors: Assistance required to identify errors made Cognition - Other Comments: Pt presents with some increased safety concerns this afternoon.  RN  states that it is likley due to medication.  Balance     End of Session PT - End of Session Activity Tolerance: Patient limited by fatigue Patient left: in bed;with call bell/phone within reach Nurse Communication: Mobility status;Other (comment) (itching. )   GP     Page, Meribeth Mattes 01/27/2012, 2:42 PM

## 2012-01-27 NOTE — Evaluation (Signed)
Occupational Therapy Evaluation Patient Details Name: [name] MRN: [id number] DOB: April 03, 1930 Today's Date: 01/27/2012 Time: 9147-8295 OT Time Calculation (min): 25 min  OT Assessment / Plan / Recommendation Clinical Impression  Pt is s/p L direct anterior THA and displays some decreased activity tolerance as she was dizzy throughout eval, weakness, and overall decreased independence with ADL and functional mobility. Will benefit from skilled OT services to improve independence with self care tasks.     OT Assessment  Patient needs continued OT Services    Follow Up Recommendations  SNF    Barriers to Discharge      Equipment Recommendations  None recommended by PT;Tub/shower bench;Other (comment) (consider a tub transfer bench if she were to d/c home)    Recommendations for Other Services    Frequency  Min 2X/week    Precautions / Restrictions Precautions Precautions: Fall Precaution Comments: Was dizzy during eval  Restrictions Weight Bearing Restrictions: No LLE Weight Bearing: Weight bearing as tolerated        ADL  Eating/Feeding: Independent Where Assessed - Eating/Feeding: Chair Grooming: Wash/dry hands;Set up Where Assessed - Grooming: Supported sitting Upper Body Bathing: Chest;Right arm;Left arm;Abdomen;Supervision/safety;Set up Where Assessed - Upper Body Bathing: Unsupported sitting Lower Body Bathing: Moderate assistance Where Assessed - Lower Body Bathing: Supported sit to stand Upper Body Dressing: Minimal assistance;Other (comment) (limited with R shoulder ROM) Where Assessed - Upper Body Dressing: Unsupported sitting Lower Body Dressing: Maximal assistance Where Assessed - Lower Body Dressing: Supported sit to stand Toilet Transfer: +2 Total assistance Toilet Transfer: Patient Percentage: 70% Toilet Transfer Method: Surveyor, minerals: Materials engineer and Hygiene: Moderate assistance Where  Assessed - Toileting Clothing Manipulation and Hygiene: Sit to stand from 3-in-1 or toilet Tub/Shower Transfer Method: Not assessed Equipment Used: Rolling walker ADL Comments: Pt dizzy throughout eval but agreeable to practice tasks. Pt's sister present. Planning SNF.     OT Diagnosis: Generalized weakness  OT Problem List: Decreased strength;Decreased activity tolerance;Decreased knowledge of use of DME or AE OT Treatment Interventions: Self-care/ADL training;Therapeutic activities;DME and/or AE instruction;Patient/family education   OT Goals Acute Rehab OT Goals OT Goal Formulation: With patient/family Time For Goal Achievement: 02/03/12 Potential to Achieve Goals: Good ADL Goals Pt Will Perform Grooming: with min assist;Standing at sink ADL Goal: Grooming - Progress: Goal set today Pt Will Perform Lower Body Bathing: with min assist;Sit to stand from chair;Sit to stand from bed;with adaptive equipment ADL Goal: Lower Body Bathing - Progress: Goal set today Pt Will Perform Lower Body Dressing: with min assist;Sit to stand from chair;Sit to stand from bed;with adaptive equipment ADL Goal: Lower Body Dressing - Progress: Goal set today Pt Will Transfer to Toilet: with min assist;Other (comment);Ambulation;with DME;3-in-1 (min guard) ADL Goal: Toilet Transfer - Progress: Goal set today Pt Will Perform Toileting - Clothing Manipulation: with min assist;Other (comment);Standing (min guard) ADL Goal: Toileting - Clothing Manipulation - Progress: Goal set today  Visit Information  Last OT Received On: 01/27/12 Assistance Needed: +2 PT/OT Co-Evaluation/Treatment: Yes    Subjective Data  Subjective: I am ok if I am not moving Patient Stated Goal: agreeable to get up with OT; none stated   Prior Functioning     Home Living Lives With: Alone Type of Home: House Home Access: Stairs to enter [employer] of Steps: 3 Entrance Stairs-Rails: Left Home Layout: One  level Bathroom Shower/Tub: Engineer, manufacturing systems: Handicapped height Home Adaptive Equipment: Straight cane;Walker - rolling;Grab bars in shower  Additional Comments: has toilet riser with no handles Prior Function Level of Independence: Independent with assistive device(s);Needs assistance Needs Assistance: Dressing Dressing: Minimal (family assists with stockings) Able to Take Stairs?: Yes Vocation: Retired Musician: No difficulties         Vision/Perception     Cognition  Overall Cognitive Status: Appears within functional limits for tasks assessed/performed Arousal/Alertness: Awake/alert Orientation Level: Appears intact for tasks assessed Behavior During Session: [facility] for tasks performed    Extremity/Trunk Assessment Right Upper Extremity Assessment RUE ROM/Strength/Tone: Deficits RUE ROM/Strength/Tone Deficits: pt with history of R shoulder replacement. ROM limited to 90 shoulder flexion active and then discomfort. pt states it is weak. Hasnt been able to reach overhead even PTA Left Upper Extremity Assessment LUE ROM/Strength/Tone: Deficits LUE ROM/Strength/Tone Deficits: pt reports L UE is weak also but not like the R. ROM generally WFL. Didnt formally  MMT Right Lower Extremity Assessment RLE ROM/Strength/Tone: WFL for tasks assessed RLE Sensation: WFL - Light Touch RLE Coordination: WFL - gross/fine motor Left Lower Extremity Assessment LLE ROM/Strength/Tone: Deficits LLE ROM/Strength/Tone Deficits: Pt able to assist with getting LLE to EOB and could perform SLR with very little assist.  LLE Sensation: WFL - Light Touch Trunk Assessment Trunk Assessment: Kyphotic     Mobility Bed Mobility Bed Mobility: Supine to Sit Supine to Sit: 4: Min assist;HOB elevated;With rails Details for Bed Mobility Assistance: Assist for LLE out of bed with cues for hand placement to self assist trunk and scoot hips to EOB.   Transfers Sit to Stand: 4:  Min assist;From elevated surface;With upper extremity assist;From bed Stand to Sit: 4: Min assist;With upper extremity assist;With armrests;To chair/3-in-1 Details for Transfer Assistance: Performed x 2 to use 3in1 at bedside.  Assist to rise and steady with cues for hand placement and LE management.      Shoulder Instructions     Exercise     Balance     End of Session OT - End of Session Activity Tolerance: Other (comment) (limited by dizziness) Patient left: in chair;with call bell/phone within reach;with family/visitor present  GO     Lennox Laity [phone] 01/27/2012, 10:09 AM

## 2012-01-27 NOTE — Progress Notes (Signed)
01/27/12 2215 Nursing Patient unable to void at this time. I and O cath for 200cc urine

## 2012-01-27 NOTE — Progress Notes (Signed)
Pt has had multiple attempts to urinate. She states "this is not anything new for her as on a regular day she will void first thing in the am and then not again until about 9-10pm. She will then go several times during the night". Pt denies any discomfort. Bladder scan identified . Orders given from Vivia Budge, MD to allow pt to attempt until 10pm and if no results then I&O cath her. Pt made aware of this plan and verbalized understanding.

## 2012-01-27 NOTE — Evaluation (Signed)
Physical Therapy Evaluation Patient Details Name: [name] MRN: [id number] DOB: [birth year] Today's Date: 01/27/2012 Time: 1478-2956 PT Time Calculation (min): 25 min  PT Assessment / Plan / Recommendation Clinical Impression  Pt presents s/p L THA (direct anterior) POD 1 with decreased strength, ROM and mobility.  Tolerated OOB to 3in1 and then to recliner, however was dizzy throughout so deferred further ambulation at this time.  Pt will benefit from skilled PT in acute venue to address deficits.  PT recommends [facility] SNF for follow up at D/C to maximize pts safety and function.      PT Assessment  Patient needs continued PT services    Follow Up Recommendations  SNF;Supervision/Assistance - 24 hour    Does the patient have the potential to tolerate intense rehabilitation      Barriers to Discharge Decreased caregiver support      Equipment Recommendations  None recommended by PT    Recommendations for Other Services     Frequency 7X/week    Precautions / Restrictions Precautions Precautions: Fall Precaution Comments: Was dizzy during  Restrictions Weight Bearing Restrictions: No LLE Weight Bearing: Weight bearing as tolerated   Pertinent Vitals/Pain No pain, just dizziness.       Mobility  Bed Mobility Bed Mobility: Supine to Sit Supine to Sit: 4: Min assist;HOB elevated;With rails Details for Bed Mobility Assistance: Assist for LLE out of bed with cues for hand placement to self assist trunk and scoot hips to EOB.   Transfers Transfers: Sit to Stand;Stand to Sit Sit to Stand: 4: Min assist;From elevated surface;With upper extremity assist;From bed Stand to Sit: 4: Min assist;With upper extremity assist;With armrests;To chair/3-in-1 Details for Transfer Assistance: Performed x 2 to use 3in1 at bedside.  Assist to rise and steady with cues for hand placement and LE management.  Ambulation/Gait Ambulation/Gait Assistance: 1: +2 Total assist Ambulation/Gait:  Patient Percentage: 70% Ambulation Distance (Feet): 3 Feet (then 4') Assistive device: Rolling walker Ambulation/Gait Assistance Details: Assist to steady throughout with cues for hand placement, sequencing/technique with RW, and safety.  Deferred further ambulation due to dizziness throughout transfers.   Gait Pattern: Step-to pattern;Decreased stride length;Trunk flexed Gait velocity: decreased Stairs: No Wheelchair Mobility Wheelchair Mobility: No    Shoulder Instructions     Exercises     PT Diagnosis: Difficulty walking;Generalized weakness;Acute pain  PT Problem List: Decreased strength;Decreased range of motion;Decreased activity tolerance;Decreased balance;Decreased mobility;Decreased knowledge of use of DME;Decreased knowledge of precautions;Pain PT Treatment Interventions: DME instruction;Gait training;Functional mobility training;Therapeutic activities;Therapeutic exercise;Balance training;Patient/family education   PT Goals Acute Rehab PT Goals PT Goal Formulation: With patient Time For Goal Achievement: 01/31/12 Potential to Achieve Goals: Good Pt will go Supine/Side to Sit: with supervision PT Goal: Supine/Side to Sit - Progress: Goal set today Pt will go Sit to Supine/Side: with supervision PT Goal: Sit to Supine/Side - Progress: Goal set today Pt will go Sit to Stand: with supervision PT Goal: Sit to Stand - Progress: Goal set today Pt will go Stand to Sit: with supervision PT Goal: Stand to Sit - Progress: Goal set today Pt will Ambulate: 51 - 150 feet;with supervision;with least restrictive assistive device PT Goal: Ambulate - Progress: Goal set today Pt will Perform Home Exercise Program: with supervision, verbal cues required/provided PT Goal: Perform Home Exercise Program - Progress: Goal set today  Visit Information  Last PT Received On: 01/27/12 Assistance Needed: +2 (safety with amb) PT/OT Co-Evaluation/Treatment: Yes    Subjective Data  Subjective:  we'll see how  I'll do when I'm on my feet.  Patient Stated Goal: to go to rehab   Prior Functioning  Home Living Lives With: Alone Type of Home: House Home Access: Stairs to enter Secretary/administrator of Steps: 3 Entrance Stairs-Rails: Left Home Layout: One level Bathroom Shower/Tub: Engineer, manufacturing systems: Handicapped height Home Adaptive Equipment: Straight cane;Walker - rolling;Grab bars in shower Additional Comments: has toilet riser Prior Function Level of Independence: Independent with assistive device(s) Able to Take Stairs?: Yes Vocation: Retired Musician: No difficulties    Cognition  Overall Cognitive Status: Appears within functional limits for tasks assessed/performed Arousal/Alertness: Awake/alert Orientation Level: Appears intact for tasks assessed Behavior During Session: [facility] for tasks performed    Extremity/Trunk Assessment Right Lower Extremity Assessment RLE ROM/Strength/Tone: WFL for tasks assessed RLE Sensation: WFL - Light Touch RLE Coordination: WFL - gross/fine motor Left Lower Extremity Assessment LLE ROM/Strength/Tone: Deficits LLE ROM/Strength/Tone Deficits: Pt able to assist with getting LLE to EOB and could perform SLR with very little assist.  LLE Sensation: WFL - Light Touch Trunk Assessment Trunk Assessment: Kyphotic   Balance    End of Session PT - End of Session Activity Tolerance: Other (comment) (Limited by dizziness. ) Patient left: in chair;with call bell/phone within reach;with family/visitor present Nurse Communication: Mobility status  GP     Page, Meribeth Mattes 01/27/2012, 9:52 AM

## 2012-01-27 NOTE — Progress Notes (Signed)
Utilization review completed.  

## 2012-01-27 NOTE — Progress Notes (Signed)
Subjective: Pt stable - doing well with pain   Objective: Vital signs in last 24 hours: Temp:  [97.5 F (36.4 C)-98.7 F (37.1 C)] 98.7 F (37.1 C) (12/07 0658) Pulse Rate:  [76-91] 90  (12/07 0658) Resp:  [10-21] 16  (12/07 0738) BP: (92-137)/(45-86) 94/55 mmHg (12/07 0658) SpO2:  [97 %-100 %] 98 % (12/07 0738) Weight:  [56.246 kg (124 lb)] 56.246 kg (124 lb) (12/06 1826)  Intake/Output from previous day: 12/06 0701 - 12/07 0700 In: 3371.3 [I.V.:3266.3; IV Piggyback:105] Out: 1580 [Urine:1330; Blood:250] Intake/Output this shift: Total I/O In: 540 [P.O.:240; I.V.:300] Out: -   Exam:  Neurovascular intact Sensation intact distally Intact pulses distally Dorsiflexion/Plantar flexion intact  Labs:  Basename 01/27/12 0539 01/24/12 1140  HGB 8.7* 11.6*    Basename 01/27/12 0539 01/24/12 1140  WBC 8.1 10.4  RBC 2.88* 3.80*  HCT 26.9* 35.0*  PLT 175 261    Basename 01/27/12 0539 01/24/12 1140  NA 139 132*  K 3.7 4.3  CL 105 96  CO2 29 28  BUN 8 14  CREATININE 0.58 0.56  GLUCOSE 151* 188*  CALCIUM 8.0* 9.3    Basename 01/24/12 1140  LABPT --  INR 1.02    Assessment/Plan: Pt doing well - mobilize with PT - labs ok   Tran Randle SCOTT 01/27/2012, 10:40 AM

## 2012-01-27 NOTE — Op Note (Signed)
[name], [name] NO.:  [id number]  MEDICAL RECORD NO.:  [id number]  LOCATION:  1602                         FACILITY:  [facility]  PHYSICIAN:  Vanita Panda. Magnus Ivan, M.D.DATE OF BIRTH:  July 26, 1930  DATE OF PROCEDURE:  01/26/2012 DATE OF DISCHARGE:                              OPERATIVE REPORT   PREOPERATIVE DIAGNOSIS:  End-stage arthritis and degenerative joint disease, left hip.  POSTOPERATIVE DIAGNOSIS:  End-stage arthritis and degenerative joint disease, left hip.  PROCEDURE:  Left total hip arthroplasty through direct anterior approach.  IMPLANTS:  DePuy Sector Gription acetabular component size 50, size 32+ 4 neutral polyethylene liner, size 12 Corail femoral component with standard offset, size 32+ 1 metal hip ball.  SURGEON:  Vanita Panda. Magnus Ivan, M.D.  ANESTHESIA:  General.  ANTIBIOTICS:  2 g of IV Ancef.  BLOOD LOSS:  Less than 500 mL.  COMPLICATIONS:  None.  INDICATIONS:  [name] is a 78 year old female with debilitating pain and arthritis involving both her hips.  The left has been worse than the right and she wished to proceed with a left total hip arthroplasty.  The risks and benefits of surgery were explained to her in detail, and she does wish to proceed.  PROCEDURE DESCRIPTION:  After informed consent was obtained, appropriate left hip was marked.  She was brought to the operating room.  General anesthesia was obtained.  She was supine on a stretcher.  Foley catheter was placed and then both feet had traction boots applied to her feet so then she was then placed supine on the HANA fracture table.  Perineal post were placed and both legs were in in-line skeletal traction with no traction applied.  We then assessed her left hip under direct fluoroscopy as well as the hip in the center of the pelvis.  We then prepped the left hip with DuraPrep and sterile drapes.  A time-out was called and she was identified as correct patient,  correct left hip.  I then made an incision inferior and posterior to the anterior superior iliac spine and carried this obliquely down the leg.  I dissected down to the tensor fascia lata and the tensor fascia was divided longitudinally.  I then proceeded with a direct anterior approach to the hip.  I cauterized the lateral femoral circumflex vessels.  I placed Cobra retractors around the lateral and medial neck and then made my arthrotomy.  I placed both Cobra retractors within the hip capsule.  I then made my femoral neck cut just proximal to the lesser trochanter with an oscillating saw and completed this with an osteotome.  I then removed the femoral head from the acetabulum in its entirety.  There was shown to be quite worn down.  I then cleaned the acetabulum debris and placed a bent Hohmann medially and a Cobra retractor laterally.  I started reaming from a size 42 reamer in 2 mm increments up to a size 50 with all reamers placed under direct visualization and the last reamer also placed under direct fluoroscopy, so we could obtain my depth of reaming, my inclination, and my anteversion.  I then placed the real DePuy Sector Gription acetabular component, the  apex hole sized at 50 and the real apex hole eliminator guide.  I placed a single screw to supplemental stabilization and placed the real 32+ 4 neutral polyethylene liner.  Attention was then turned to the femur.  With the femur externally rotated, extended, and adducted, I gained access to the femoral canal with a box cutting guide and a rongeur.  I placed the Bent Hohmann behind the greater trochanter and Mueller medially.  I began broaching from a size 8 broach up to a size 12, the 12 was felt to be most stable, so I trialed a standard neck and a 36+ 1 hip ball.  We brought the leg back over and up and with traction and internal rotation reduced the hip.  I was pleased with the leg length and stability with the rotation and  assessed under direct fluoroscopy.  I then dislocated the hip and removed the trial femoral component and head and neck.  I placed the real size 12 Corail femoral component, the real 32+ 1 metal hip ball.  We reduced this back in the acetabulum that was definitely stable.  I then copiously irrigated the soft tissues with normal saline solution.  I closed the joint capsule with interrupted #1 Ethibond suture followed by running 0 V-Loc in the tensor fascia, 2-0 Vicryl in subcutaneous tissue, and staples on the skin.  Xeroform and well-padded sterile dressing was applied.  She was taken off the HANA table, awakened, extubated, and taken to the recovery room in stable condition. All final counts were correct.  There were no complications noted.     Vanita Panda. Magnus Ivan, M.D.     CYB/MEDQ  D:  01/26/2012  T:  01/27/2012  Job:  [id number]

## 2012-01-28 LAB — CBC
HCT: 23.5 % — ABNORMAL LOW (ref 36.0–46.0)
MCH: 31 pg (ref 26.0–34.0)
MCHC: 33.6 g/dL (ref 30.0–36.0)
MCV: 92.2 fL (ref 78.0–100.0)
RDW: 13.2 % (ref 11.5–15.5)

## 2012-01-28 MED ORDER — CALCIUM CARBONATE 1250 (500 CA) MG PO TABS
1.0000 | ORAL_TABLET | Freq: Two times a day (BID) | ORAL | Status: DC
  Administered 2012-01-28 – 2012-01-30 (×3): 500 mg via ORAL
  Filled 2012-01-28 (×6): qty 1

## 2012-01-28 MED ORDER — NON FORMULARY: 20.0000 mg | Freq: Every morning | Status: DC

## 2012-01-28 MED ORDER — OMEPRAZOLE 20 MG PO CPDR
20.0000 mg | DELAYED_RELEASE_CAPSULE | Freq: Every day | ORAL | Status: DC
  Administered 2012-01-28 – 2012-01-30 (×3): 20 mg via ORAL
  Filled 2012-01-28 (×3): qty 1

## 2012-01-28 NOTE — Progress Notes (Signed)
Physical Therapy Treatment Patient Details Name: [name] MRN: [id number] DOB: 1930-03-29 Today's Date: 01/28/2012 Time: 4098-1191 PT Time Calculation (min): 25 min  PT Assessment / Plan / Recommendation Comments on Treatment Session  Pt very dizzy during session but agreeable to try and use restroom and get to the chair.      Follow Up Recommendations  SNF;Supervision/Assistance - 24 hour     Does the patient have the potential to tolerate intense rehabilitation     Barriers to Discharge        Equipment Recommendations  None recommended by PT;Tub/shower bench;Other (comment)    Recommendations for Other Services    Frequency 7X/week   Plan Discharge plan remains appropriate    Precautions / Restrictions Precautions Precautions: Fall Precaution Comments: Max c/o dizziness during session Restrictions Weight Bearing Restrictions: No LLE Weight Bearing: Weight bearing as tolerated   Pertinent Vitals/Pain Pt c/o tenderness in hip, but mostly dizziness.     Mobility  Bed Mobility Bed Mobility: Supine to Sit Supine to Sit: 4: Min assist;HOB elevated;With rails Details for Bed Mobility Assistance: Assist for LLE out of bed with max cues for hand placement on bed to self assist trunk.  Transfers Transfers: Sit to Stand;Stand to Sit Sit to Stand: 4: Min assist;From elevated surface;With upper extremity assist;From bed Stand to Sit: 4: Min assist;With upper extremity assist;With armrests;To chair/3-in-1 Details for Transfer Assistance: Performed x 2 in order to use 3in1 at bedside with MAX cues for safety, hand placement, and keeping RW with her until at seating surface.  Ambulation/Gait Ambulation/Gait Assistance: 4: Min assist Ambulation Distance (Feet): 3 Feet (then 5') Assistive device: Rolling walker Ambulation/Gait Assistance Details: Max cues for sequencing/technique with RW and to maintain upright posture.  Gait Pattern: Step-to pattern;Decreased stride  length;Trunk flexed Gait velocity: decreased    Exercises     PT Diagnosis:    PT Problem List:   PT Treatment Interventions:     PT Goals Acute Rehab PT Goals PT Goal Formulation: With patient Time For Goal Achievement: 01/31/12 Potential to Achieve Goals: Good Pt will go Supine/Side to Sit: with supervision PT Goal: Supine/Side to Sit - Progress: Progressing toward goal Pt will go Sit to Stand: with supervision PT Goal: Sit to Stand - Progress: Progressing toward goal Pt will go Stand to Sit: with supervision PT Goal: Stand to Sit - Progress: Progressing toward goal Pt will Ambulate: 51 - 150 feet;with supervision;with least restrictive assistive device PT Goal: Ambulate - Progress: Progressing toward goal  Visit Information  Last PT Received On: 01/28/12 Assistance Needed: +2 (for safety w/ amb)    Subjective Data  Subjective: I feel awful.  Patient Stated Goal: to go to rehab   Cognition  Overall Cognitive Status: Impaired Area of Impairment: Following commands;Safety/judgement;Awareness of errors Arousal/Alertness: Lethargic Orientation Level: Appears intact for tasks assessed Behavior During Session: Lethargic Following Commands: Follows one step commands inconsistently Safety/Judgement: Impulsive;Decreased awareness of safety precautions;Decreased safety judgement for tasks assessed    Balance     End of Session PT - End of Session Activity Tolerance: Patient limited by fatigue;Other (comment) (dizziness) Patient left: in chair;with call bell/phone within reach Nurse Communication: Mobility status;Other (comment) (dizziness and use of restroom)   GP     Page, Meribeth Mattes 01/28/2012, 11:06 AM

## 2012-01-28 NOTE — Progress Notes (Signed)
PT note:  Pt now getting unit of PRBC.  Will hold second session of PT to allow unit to finish.  Thanks, [employer], PT

## 2012-01-28 NOTE — Progress Notes (Deleted)
Physical Therapy Treatment Patient Details Name: [name] MRN: [id number] DOB: [birth year] Today's Date: 01/28/2012 Time: 4696-2952 PT Time Calculation (min): 25 min  PT Assessment / Plan / Recommendation Comments on Treatment Session  Pt very dizzy during session but agreeable to try and use restroom and get to the chair.      Follow Up Recommendations  SNF;Supervision/Assistance - 24 hour     Does the patient have the potential to tolerate intense rehabilitation     Barriers to Discharge        Equipment Recommendations  None recommended by PT;Tub/shower bench;Other (comment)    Recommendations for Other Services    Frequency 7X/week   Plan Discharge plan remains appropriate    Precautions / Restrictions Precautions Precautions: Fall Precaution Comments: Max c/o dizziness during session Restrictions Weight Bearing Restrictions: No LLE Weight Bearing: Weight bearing as tolerated   Pertinent Vitals/Pain No pain, just dizziness    Mobility  Bed Mobility Bed Mobility: Supine to Sit Supine to Sit: 4: Min assist;HOB elevated;With rails Details for Bed Mobility Assistance: Assist for LLE out of bed with max cues for hand placement on bed to self assist trunk.  Transfers Transfers: Sit to Stand;Stand to Sit Sit to Stand: 4: Min assist;From elevated surface;With upper extremity assist;From bed Stand to Sit: 4: Min assist;With upper extremity assist;With armrests;To chair/3-in-1 Details for Transfer Assistance: Performed x 2 in order to use 3in1 at bedside with MAX cues for safety, hand placement, and keeping RW with her until at seating surface.  Ambulation/Gait Ambulation/Gait Assistance: 4: Min assist Ambulation Distance (Feet): 3 Feet (then 5') Assistive device: Rolling walker Ambulation/Gait Assistance Details: Max cues for sequencing/technique with RW and to maintain upright posture.  Gait Pattern: Step-to pattern;Decreased stride length;Trunk flexed Gait  velocity: decreased    Exercises     PT Diagnosis:    PT Problem List:   PT Treatment Interventions:     PT Goals Acute Rehab PT Goals PT Goal Formulation: With patient Time For Goal Achievement: 01/31/12 Potential to Achieve Goals: Good Pt will go Supine/Side to Sit: with supervision PT Goal: Supine/Side to Sit - Progress: Progressing toward goal Pt will go Sit to Stand: with supervision PT Goal: Sit to Stand - Progress: Progressing toward goal Pt will go Stand to Sit: with supervision PT Goal: Stand to Sit - Progress: Progressing toward goal Pt will Ambulate: 51 - 150 feet;with supervision;with least restrictive assistive device PT Goal: Ambulate - Progress: Progressing toward goal  Visit Information  Last PT Received On: 01/28/12 Assistance Needed: +2 (for safety w/ amb)    Subjective Data  Subjective: I feel awful.  Patient Stated Goal: to go to rehab   Cognition  Overall Cognitive Status: Impaired Area of Impairment: Following commands;Safety/judgement;Awareness of errors Arousal/Alertness: Lethargic Orientation Level: Appears intact for tasks assessed Behavior During Session: Lethargic Following Commands: Follows one step commands inconsistently Safety/Judgement: Impulsive;Decreased awareness of safety precautions;Decreased safety judgement for tasks assessed    Balance     End of Session PT - End of Session Activity Tolerance: Patient limited by fatigue;Other (comment) (dizziness) Patient left: in chair;with call bell/phone within reach Nurse Communication: Mobility status;Other (comment) (dizziness and use of restroom)   GP     Page, Meribeth Mattes 01/28/2012, 11:41 AM

## 2012-01-28 NOTE — Progress Notes (Signed)
Subjective: Pt stable but dizzy   Objective: Vital signs in last 24 hours: Temp:  [99 F (37.2 C)-100.2 F (37.9 C)] 99 F (37.2 C) (12/08 0500) Pulse Rate:  [81-95] 93  (12/08 0500) Resp:  [16-20] 20  (12/08 0500) BP: (90-112)/(54-64) 112/54 mmHg (12/08 1012) SpO2:  [94 %-100 %] 94 % (12/08 0500)  Intake/Output from previous day: 12/07 0701 - 12/08 0700 In: 1920 [P.O.:720; I.V.:1200] Out: 275 [Urine:275] Intake/Output this shift: Total I/O In: 420 [P.O.:120; I.V.:300] Out: 100 [Urine:100]  Exam:  Neurovascular intact Sensation intact distally Intact pulses distally Dorsiflexion/Plantar flexion intact  Labs:  Basename 01/28/12 0452 01/27/12 0539  HGB 7.9* 8.7*    Basename 01/28/12 0452 01/27/12 0539  WBC 11.4* 8.1  RBC 2.55* 2.88*  HCT 23.5* 26.9*  PLT 163 175    Basename 01/27/12 0539  NA 139  K 3.7  CL 105  CO2 29  BUN 8  CREATININE 0.58  GLUCOSE 151*  CALCIUM 8.0*   No results found for this basename: LABPT:2,INR:2 in the last 72 hours  Assessment/Plan: Symptomatic anemia - tx 1 u prbc - add ca - prilosec also   [name] SCOTT 01/28/2012, 11:21 AM

## 2012-01-28 NOTE — Progress Notes (Signed)
Clinical Social Work Department BRIEF PSYCHOSOCIAL ASSESSMENT 01/28/2012  Patient:  [name], [name]     Account Number:  [id number]     Admit date:  01/26/2012  Clinical Social Worker:  Doroteo Glassman  Date/Time:  01/28/2012 11:04 AM  Referred by:  Physician  Date Referred:  01/28/2012 Referred for  SNF Placement   Other Referral:   Interview type:  Patient Other interview type:    PSYCHOSOCIAL DATA Living Status:  ALONE Admitted from facility:   Level of care:   Primary support name:  Tammy Primary support relationship to patient:  FAMILY Degree of support available:   adequate    CURRENT CONCERNS Current Concerns  Post-Acute Placement   Other Concerns:    SOCIAL WORK ASSESSMENT / PLAN Met with Pt to discuss d/c plan.    Pt stated that her granddaughter has spoken with [facility] and that she's hopeful that they will have a bed for her upon d/c.  Pt stated that she will not go to another SNF, should [facility] not be an options; she will d/c home with [facility].    CSW thanked Pt for her time.   Assessment/plan status:   Other assessment/ plan:   Information/referral to community resources:   Will give with bed offers    PATIENT'S/FAMILY'S RESPONSE TO PLAN OF CARE: Pt thanked CSW for time and assistance   CSW to continue to follow.  Providence Crosby, LCSWA Clinical Social Work [phone]

## 2012-01-28 NOTE — Progress Notes (Signed)
Clinical Social Work Department CLINICAL SOCIAL WORK PLACEMENT NOTE 01/28/2012  Patient:  [name], [name]  Account Number:  [id number] Admit date:  01/26/2012  Clinical Social Worker:  Doroteo Glassman  Date/time:  01/28/2012 11:15 AM  Clinical Social Work is seeking post-discharge placement for this patient at the following level of care:   SKILLED NURSING   (*CSW will update this form in Epic as items are completed)   Will give with offers Patient/family provided with [facility] Department of Clinical Social Work's list of facilities offering this level of care within the geographic area requested by the patient (or if unable, by the patient's family).  01/28/12  Patient/family informed of their freedom to choose among providers that offer the needed level of care, that participate in Medicare, Medicaid or managed care program needed by the patient, have an available bed and are willing to accept the patient.  01/28/12  Patient/family informed of [facility]' ownership interest in [facility], as well as of the fact that they are under no obligation to receive care at this facility.  PASARR submitted to EDS on 01/28/2012 PASARR number received from EDS on 01/28/2012  FL2 transmitted to all facilities in geographic area requested by pt/family on  01/28/2012 FL2 transmitted to all facilities within larger geographic area on   Patient informed that his/her managed care company has contracts with or will negotiate with  certain facilities, including the following:     Patient/family informed of bed offers received:   Patient chooses bed at  Physician recommends and patient chooses bed at    Patient to be transferred to  on   Patient to be transferred to facility by   The following physician request were entered in Epic:   Additional Comments:  CSW to continue to follow.  Providence Crosby, LCSWA Clinical Social Work [phone]

## 2012-01-29 LAB — TYPE AND SCREEN

## 2012-01-29 LAB — CBC
MCH: 30.4 pg (ref 26.0–34.0)
Platelets: 160 10*3/uL (ref 150–400)
RBC: 2.96 MIL/uL — ABNORMAL LOW (ref 3.87–5.11)
RDW: 14.1 % (ref 11.5–15.5)
WBC: 10.2 10*3/uL (ref 4.0–10.5)

## 2012-01-29 MED ORDER — TRAMADOL HCL 50 MG PO TABS: 50.0000 mg | ORAL_TABLET | Freq: Four times a day (QID) | ORAL | Status: DC | PRN

## 2012-01-29 MED ORDER — METHOCARBAMOL 500 MG PO TABS: 500.0000 mg | ORAL_TABLET | Freq: Four times a day (QID) | ORAL | Status: DC | PRN

## 2012-01-29 MED ORDER — ASPIRIN 325 MG PO TBEC: 325.0000 mg | DELAYED_RELEASE_TABLET | Freq: Every day | ORAL | Status: DC

## 2012-01-29 NOTE — Progress Notes (Signed)
105 patient c/o of chest pain points to epigastric area, sitting in chair eating lunch, vss no distress noted . maalox given will continue to monitor D FranklinRN

## 2012-01-29 NOTE — Discharge Summary (Signed)
Patient ID: [name] MRN: [id number] DOB/AGE: 26-Feb-1930 78 y.o.  Admit date: 01/26/2012 Discharge date: 01/29/2012  Admission Diagnoses:  Principal Problem:  *Degenerative arthritis of hip   Discharge Diagnoses:  Same  Past Medical History  Diagnosis Date  . Angina   . Pneumonia 12/12/10  . Hiatal hernia   . Arthritis   . GERD (gastroesophageal reflux disease)   . COPD (chronic obstructive pulmonary disease)     pt. denies having this  . Iron deficiency anemia   . Barrett esophagus   . HTN (hypertension)   . DJD (degenerative joint disease)   . Osteoporosis   . HLD (hyperlipidemia)   . Internal hemorrhoids   . Diverticulosis   . Shortness of breath     MILD- WITH EXERTION  . Pain     RIGHT SHOULDER WITH LIMITED ROM--STATES PREVIOUS RT TOTAL SHOULDDER REPLACEMENT-BUT STILL HAS PROBLEMS WITH SHOULDER  . Coronary artery disease     Surgeries: Procedure(s): TOTAL HIP ARTHROPLASTY ANTERIOR APPROACH on 01/26/2012   Consultants:    Discharged Condition: Improved  Hospital Course: [name] is an 78 y.o. female who was admitted 01/26/2012 for operative treatment ofDegenerative arthritis of hip. Patient has severe unremitting pain that affects sleep, daily activities, and work/hobbies. After pre-op clearance the patient was taken to the operating room on 01/26/2012 and underwent  Procedure(s): TOTAL HIP ARTHROPLASTY ANTERIOR APPROACH.    Patient was given perioperative antibiotics: Anti-infectives     Start     Dose/Rate Route Frequency Ordered Stop   01/27/12 1000   hydroxychloroquine (PLAQUENIL) tablet 200 mg        200 mg Oral Daily 01/26/12 1838     01/26/12 2200   clindamycin (CLEOCIN) IVPB 600 mg        600 mg 100 mL/hr over 30 Minutes Intravenous Every 6 hours 01/26/12 1838 01/27/12 0517   01/26/12 2000   fluconazole (DIFLUCAN) tablet 150 mg        150 mg Oral  Once 01/26/12 1838 01/26/12 2022   01/26/12 1230   clindamycin (CLEOCIN) IVPB 900 mg       900 mg 100 mL/hr over 30 Minutes Intravenous 60 min pre-op 01/26/12 1230 01/26/12 1525           Patient was given sequential compression devices, early ambulation, and chemoprophylaxis to prevent DVT.  Patient benefited maximally from hospital stay and there were no complications.    Recent vital signs: Patient Vitals for the past 24 hrs:  BP Temp Temp src Pulse Resp SpO2  01/29/12 0445 124/93 mmHg 99 F (37.2 C) Oral 88  20  91 %  01/29/12 0438 - - - - 16  92 %  01/29/12 0034 138/83 mmHg 98.5 F (36.9 C) Oral 89  20  93 %  01/28/12 2115 103/67 mmHg 99.5 F (37.5 C) Oral 87  20  93 %  01/28/12 2019 - - - 84  18  95 %  01/28/12 1705 117/65 mmHg 98.9 F (37.2 C) Oral 90  20  -  01/28/12 1615 114/69 mmHg 98.8 F (37.1 C) Oral 89  20  -  01/28/12 1515 116/61 mmHg 98.9 F (37.2 C) Oral 90  20  -  01/28/12 1415 119/76 mmHg 98.4 F (36.9 C) Oral 93  20  -  01/28/12 1345 124/57 mmHg 98.1 F (36.7 C) Oral 93  20  -  01/28/12 1012 112/54 mmHg - - - - -     Recent laboratory studies:  Basename 01/29/12 0444 01/28/12 0452 01/27/12 0539  WBC 10.2 11.4* --  HGB 9.0* 7.9* --  HCT 26.7* 23.5* --  PLT 160 163 --  NA -- -- 139  K -- -- 3.7  CL -- -- 105  CO2 -- -- 29  BUN -- -- 8  CREATININE -- -- 0.58  GLUCOSE -- -- 151*  INR -- -- --  CALCIUM -- -- 8.0*     Discharge Medications:     Medication List     As of 01/29/2012  7:20 AM    STOP taking these medications         aspirin 81 MG tablet      TAKE these medications         acetaminophen 650 MG CR tablet   Commonly known as: TYLENOL   Take 1,300 mg by mouth. IF NEEDED FOR PAIN      amLODipine 5 MG tablet   Commonly known as: NORVASC   Take 5 mg by mouth daily before breakfast.      aspirin 325 MG EC tablet   Take 1 tablet (325 mg total) by mouth daily with breakfast.      CALTRATE 600+D 600-400 MG-UNIT per tablet   Generic drug: Calcium Carbonate-Vitamin D   Take 1 tablet by mouth daily.       CENTRUM SILVER PO   Take 1 tablet by mouth daily.      FLAX SEED OIL PO   Take 1 tablet by mouth daily.      hydroxychloroquine 200 MG tablet   Commonly known as: PLAQUENIL   Take 200 mg by mouth daily.      methocarbamol 500 MG tablet   Commonly known as: ROBAXIN   Take 1 tablet (500 mg total) by mouth every 6 (six) hours as needed.      omeprazole 20 MG tablet   Commonly known as: PRILOSEC OTC   Take 20 mg by mouth daily.      promethazine 12.5 MG tablet   Commonly known as: PHENERGAN   Take 12.5 mg by mouth. PT TAKES WITH TRAMADOL      traMADol 50 MG tablet   Commonly known as: ULTRAM   Take 1 tablet (50 mg total) by mouth every 6 (six) hours as needed for pain. Pain      vitamin C 500 MG tablet   Commonly known as: ASCORBIC ACID   Take 500 mg by mouth daily.      Vitamin D3 2000 UNITS Tabs   Take 1 tablet by mouth daily.        Diagnostic Studies: Dg Chest 2 View  01/24/2012  *RADIOLOGY REPORT*  Clinical Data: Preoperative respiratory exam.  Osteoarthritis of the left hip.  CHEST - 2 VIEW  Comparison: 12/08/2010  Findings: The heart size and pulmonary vascularity are normal. There is tortuosity and calcification of the thoracic aorta.  Large hiatal hernia.  There are no infiltrates or effusions.  Since the prior exam the patient has developed slight superior subluxation of the right humeral prosthesis at the glenoid. Chronic accentuation of the thoracic kyphosis with osteopenia and old compression fracture of the mid thoracic spine treated with vertebroplasty.  IMPRESSION: No acute abnormality.  Large hiatal hernia.   Original Report Authenticated By: Francene Boyers, M.D.    Dg Hip Complete Left  01/26/2012  *RADIOLOGY REPORT*  Clinical Data: Left hip prosthesis.  LEFT HIP - COMPLETE 2+ VIEW,DG C-ARM 61-120 MIN - NRPT [facility]  Comparison: None.  Findings: Two intraoperative views of the left hip submitted for review after surgery.  Total left hip prosthesis appears in  satisfactory position on this single projection.  Vascular calcifications  IMPRESSION: Total left hip prosthesis appears in satisfactory position on this single projection.   Original Report Authenticated By: Lacy Duverney, M.D.    Dg Pelvis Portable  01/26/2012  *RADIOLOGY REPORT*  Clinical Data: Status post left hip replacement.  PORTABLE PELVIS  Comparison: C-arm images obtained earlier today.  Findings: Left total hip prosthesis in satisfactory position and alignment.  No fracture or dislocation seen.  Right hip degenerative changes and atheromatous arterial calcifications.  IMPRESSION: Satisfactory postoperative appearance of a left total hip prosthesis.   Original Report Authenticated By: Beckie Salts, M.D.    Dg Hip Portable 1 View Left  01/26/2012  *RADIOLOGY REPORT*  Clinical Data: Status post left hip replacement.  PORTABLE LEFT HIP - 1 VIEW  Comparison: Portable pelvis obtained at the same time.  Findings: Again demonstrated is a total hip prosthesis in satisfactory position and alignment.  No fracture or dislocation seen.  IMPRESSION: Satisfactory postoperative appearance of a left total hip prosthesis.   Original Report Authenticated By: Beckie Salts, M.D.    Dg C-arm 61-120 Min-no Report  01/26/2012  *RADIOLOGY REPORT*  Clinical Data: Left hip prosthesis.  LEFT HIP - COMPLETE 2+ VIEW,DG C-ARM 61-120 MIN - NRPT [facility]  Comparison: None.  Findings: Two intraoperative views of the left hip submitted for review after surgery.  Total left hip prosthesis appears in satisfactory position on this single projection.  Vascular calcifications  IMPRESSION: Total left hip prosthesis appears in satisfactory position on this single projection.   Original Report Authenticated By: Lacy Duverney, M.D.     Disposition: to SNF      Discharge Orders    Future Orders Please Complete By Expires   Diet - low sodium heart healthy      Full weight bearing      Call MD / Call 911      Comments:   If you  experience chest pain or shortness of breath, CALL 911 and be transported to the hospital emergency room.  If you develope a fever above 101 F, pus (white drainage) or increased drainage or redness at the wound, or calf pain, call your surgeon's office.   Constipation Prevention      Comments:   Drink plenty of fluids.  Prune juice may be helpful.  You may use a stool softener, such as Colace (over the counter) 100 mg twice a day.  Use MiraLax (over the counter) for constipation as needed.   Increase activity slowly as tolerated      Discharge instructions      Comments:   Full weight as tolerated left hip.  No hip precautions. Expect left thigh and leg swelling.  Ice and TED hose as needed. Can get entire incision wet in the shower starting 01/31/12. Daily dry dressing changes to left hip incision.   Discharge patient         Follow-up Information    Follow up with Kathryne Hitch, MD. In 2 weeks.   Contact information:   [address] [place] [address] [phone]           Signed: Kathryne Hitch 01/29/2012, 7:20 AM

## 2012-01-29 NOTE — Progress Notes (Signed)
Physical Therapy Treatment Patient Details Name: [name] MRN: [id number] DOB: 1930/07/17 Today's Date: 01/29/2012 Time: 4098-1191 PT Time Calculation (min): 11 min  PT Assessment / Plan / Recommendation Comments on Treatment Session  Pt progressing very well with no c/o dizziness during session.  States she is going to rehab tomorrow.     Follow Up Recommendations  SNF;Supervision/Assistance - 24 hour     Does the patient have the potential to tolerate intense rehabilitation     Barriers to Discharge        Equipment Recommendations  None recommended by PT    Recommendations for Other Services    Frequency 7X/week   Plan Discharge plan remains appropriate    Precautions / Restrictions Precautions Precautions: Fall Restrictions Weight Bearing Restrictions: No LLE Weight Bearing: Weight bearing as tolerated   Pertinent Vitals/Pain No pain, just some soreness.     Mobility  Bed Mobility Bed Mobility: Supine to Sit;Sit to Supine Supine to Sit: 4: Min assist;HOB elevated;With rails Sit to Supine: 4: Min assist;HOB flat Details for Bed Mobility Assistance: Assist for LLE into and out of bed with cues for hand placement on bed.  Pt continues to reach for others (family member in room) to self assist when up.   Transfers Transfers: Sit to Stand;Stand to Sit Sit to Stand: 4: Min guard;With upper extremity assist;From bed Stand to Sit: 4: Min guard;With upper extremity assist;To bed Details for Transfer Assistance: Min/guard for safety with continued cues for hand placement and safety when sitting/standing.  Ambulation/Gait Ambulation/Gait Assistance: 4: Min guard Ambulation Distance (Feet): 120 Feet Assistive device: Rolling walker Ambulation/Gait Assistance Details: Min cues for technique and upright posture.  Gait Pattern: Step-to pattern;Decreased stride length;Trunk flexed Gait velocity: decreased    Exercises     PT Diagnosis:    PT Problem List:   PT  Treatment Interventions:     PT Goals Acute Rehab PT Goals PT Goal Formulation: With patient Time For Goal Achievement: 01/31/12 Potential to Achieve Goals: Good Pt will go Supine/Side to Sit: with supervision PT Goal: Supine/Side to Sit - Progress: Progressing toward goal Pt will go Sit to Supine/Side: with supervision PT Goal: Sit to Supine/Side - Progress: Progressing toward goal Pt will go Sit to Stand: with supervision PT Goal: Sit to Stand - Progress: Progressing toward goal Pt will go Stand to Sit: with supervision PT Goal: Stand to Sit - Progress: Progressing toward goal Pt will Ambulate: 51 - 150 feet;with supervision;with least restrictive assistive device PT Goal: Ambulate - Progress: Progressing toward goal  Visit Information  Last PT Received On: 01/29/12 Assistance Needed: +1    Subjective Data  Subjective: I'll get up and walk again Patient Stated Goal: to go to rehab   Cognition  Overall Cognitive Status: Impaired Area of Impairment: Following commands;Safety/judgement Arousal/Alertness: Awake/alert Orientation Level: Appears intact for tasks assessed Behavior During Session: [facility] for tasks performed Following Commands: Follows one step commands inconsistently Safety/Judgement: Decreased awareness of safety precautions;Decreased safety judgement for tasks assessed Awareness of Errors: Assistance required to identify errors made    Balance     End of Session PT - End of Session Activity Tolerance: Patient tolerated treatment well Patient left: in bed;with call bell/phone within reach;with family/visitor present Nurse Communication: Mobility status   GP     Page, Meribeth Mattes 01/29/2012, 4:12 PM

## 2012-01-29 NOTE — Progress Notes (Signed)
Subjective: 3 Days Post-Op Procedure(s) (LRB): TOTAL HIP ARTHROPLASTY ANTERIOR APPROACH (Left) Patient reports pain as moderate.  Responded well with a transfusion yesterday.  FL-2 on chart for ST-SNF placement.  Objective: Vital signs in last 24 hours: Temp:  [98.1 F (36.7 C)-99.5 F (37.5 C)] 99 F (37.2 C) (12/09 0445) Pulse Rate:  [84-93] 88  (12/09 0445) Resp:  [16-20] 20  (12/09 0445) BP: (103-138)/(54-93) 124/93 mmHg (12/09 0445) SpO2:  [91 %-95 %] 91 % (12/09 0445)  Intake/Output from previous day: 12/08 0701 - 12/09 0700 In: 2084.7 [P.O.:660; I.V.:1058; Blood:366.7] Out: 650 [Urine:650] Intake/Output this shift:     Basename 01/29/12 0444 01/28/12 0452 01/27/12 0539  HGB 9.0* 7.9* 8.7*    Basename 01/29/12 0444 01/28/12 0452  WBC 10.2 11.4*  RBC 2.96* 2.55*  HCT 26.7* 23.5*  PLT 160 163    Basename 01/27/12 0539  NA 139  K 3.7  CL 105  CO2 29  BUN 8  CREATININE 0.58  GLUCOSE 151*  CALCIUM 8.0*   No results found for this basename: LABPT:2,INR:2 in the last 72 hours  Sensation intact distally Intact pulses distally Dorsiflexion/Plantar flexion intact Incision: scant drainage No cellulitis present Compartment soft  Assessment/Plan: 3 Days Post-Op Procedure(s) (LRB): TOTAL HIP ARTHROPLASTY ANTERIOR APPROACH (Left) Up with therapy Discharge to SNF when bed available.  BLACKMAN,CHRISTOPHER Y 01/29/2012, 7:14 AM

## 2012-01-29 NOTE — Progress Notes (Signed)
Physical Therapy Treatment Patient Details Name: [name] MRN: [id number] DOB: 28-Mar-1930 Today's Date: 01/29/2012 Time: 4098-1191 PT Time Calculation (min): 18 min  PT Assessment / Plan / Recommendation Comments on Treatment Session  Pt states that she continues to be dizzy, however her mobility is better today after getting blood transfusion yesterday.      Follow Up Recommendations  SNF;Supervision/Assistance - 24 hour     Does the patient have the potential to tolerate intense rehabilitation     Barriers to Discharge        Equipment Recommendations  None recommended by PT;Tub/shower bench;Other (comment)    Recommendations for Other Services    Frequency 7X/week   Plan Discharge plan remains appropriate    Precautions / Restrictions Precautions Precautions: Fall Restrictions Weight Bearing Restrictions: No LLE Weight Bearing: Weight bearing as tolerated   Pertinent Vitals/Pain Some c/o tenderness in hip.     Mobility  Bed Mobility Bed Mobility: Supine to Sit Supine to Sit: 4: Min assist;HOB elevated;With rails Details for Bed Mobility Assistance: Assist for LLE out of bed with mod cues for hand placement on bed to self assist instead of pulling on hand rails and reaching for therapist.  Transfers Transfers: Sit to Stand;Stand to Sit Sit to Stand: 4: Min assist;From elevated surface;With upper extremity assist;From bed Stand to Sit: 4: Min assist;With upper extremity assist;With armrests;To chair/3-in-1 Details for Transfer Assistance: Assist to rise and steady with max cues for hand placement on bed to stand.  Pt has tendency to pull from front of walker to stand.  Ambulation/Gait Ambulation/Gait Assistance: 4: Min assist Ambulation Distance (Feet): 85 Feet Assistive device: Rolling walker Ambulation/Gait Assistance Details: Cues for sequencing/technique and upright posture throughout.  Gait Pattern: Step-to pattern;Decreased stride length;Trunk flexed Gait  velocity: decreased    Exercises Total Joint Exercises Ankle Circles/Pumps: AROM;Both;15 reps Quad Sets: AROM;Left;10 reps Heel Slides: AAROM;Left;10 reps Hip ABduction/ADduction: AAROM;Left;10 reps   PT Diagnosis:    PT Problem List:   PT Treatment Interventions:     PT Goals Acute Rehab PT Goals PT Goal Formulation: With patient Time For Goal Achievement: 01/31/12 Potential to Achieve Goals: Good Pt will go Supine/Side to Sit: with supervision PT Goal: Supine/Side to Sit - Progress: Progressing toward goal Pt will go Sit to Stand: with supervision PT Goal: Sit to Stand - Progress: Progressing toward goal Pt will go Stand to Sit: with supervision PT Goal: Stand to Sit - Progress: Progressing toward goal Pt will Ambulate: 51 - 150 feet;with supervision;with least restrictive assistive device PT Goal: Ambulate - Progress: Progressing toward goal Pt will Perform Home Exercise Program: with supervision, verbal cues required/provided PT Goal: Perform Home Exercise Program - Progress: Progressing toward goal  Visit Information  Last PT Received On: 01/29/12 Assistance Needed: +2    Subjective Data  Subjective: I'm still dizzy Patient Stated Goal: to go to rehab   Cognition  Overall Cognitive Status: Impaired Area of Impairment: Following commands;Safety/judgement Arousal/Alertness: Awake/alert Orientation Level: Appears intact for tasks assessed Behavior During Session: [facility] for tasks performed Following Commands: Follows one step commands inconsistently Safety/Judgement: Decreased awareness of safety precautions;Decreased safety judgement for tasks assessed    Balance     End of Session PT - End of Session Activity Tolerance: Patient tolerated treatment well Patient left: in chair;with call bell/phone within reach Nurse Communication: Mobility status   GP     Page, Meribeth Mattes 01/29/2012, 10:22 AM

## 2012-01-30 ENCOUNTER — Encounter (HOSPITAL_COMMUNITY): Payer: Self-pay | Admitting: Orthopaedic Surgery

## 2012-01-30 MED ORDER — VITAMINS A & D EX OINT
TOPICAL_OINTMENT | CUTANEOUS | Status: AC
  Administered 2012-01-30: 12:00:00
  Filled 2012-01-30: qty 5

## 2012-01-30 MED ORDER — BISACODYL 10 MG RE SUPP
10.0000 mg | Freq: Once | RECTAL | Status: AC
  Administered 2012-01-30: 10 mg via RECTAL
  Filled 2012-01-30: qty 1

## 2012-01-30 NOTE — Progress Notes (Signed)
Physical Therapy Treatment Patient Details Name: [name] MRN: [id number] DOB: 13-Sep-1930 Today's Date: 01/30/2012 Time: 2440-1027 PT Time Calculation (min): 25 min  PT Assessment / Plan / Recommendation Comments on Treatment Session  POD #4 L THR Anterior Approach. Amb pt in hallway then performed TE's.  Pt plans to D/C to SNF today.    Follow Up Recommendations  SNF     Does the patient have the potential to tolerate intense rehabilitation     Barriers to Discharge        Equipment Recommendations  None recommended by PT    Recommendations for Other Services    Frequency 7X/week   Plan Discharge plan remains appropriate    Precautions / Restrictions Precautions Precautions: Fall Precaution Comments: No c/o of dizzyness Restrictions Weight Bearing Restrictions: No LLE Weight Bearing: Weight bearing as tolerated   Pertinent Vitals/Pain C/o "soreness" ICE applied    Mobility  Bed Mobility Bed Mobility: Not assessed Details for Bed Mobility Assistance: Pt OOB in recliner Transfers Transfers: Sit to Stand;Stand to Sit Sit to Stand: 4: Min guard;From chair/3-in-1 Stand to Sit: 4: Min guard;To chair/3-in-1 Details for Transfer Assistance: <25% VC's on safety with turns and backward gait Ambulation/Gait Ambulation/Gait Assistance: 4: Min guard Ambulation Distance (Feet): 120 Feet (60' x 2) Assistive device: Rolling walker Ambulation/Gait Assistance Details: <25% VC's on safety with turns and required one sitting rest break Gait Pattern: Step-to pattern;Decreased stride length;Trunk flexed Gait velocity: decreased    Exercises Total Joint Exercises Ankle Circles/Pumps: AROM;Both;10 reps;Supine Quad Sets: AROM;Both;10 reps;Supine Gluteal Sets: AROM;Both;10 reps;Supine Short Arc Quad: AAROM;Left;Supine Heel Slides: Left;10 reps;AAROM;Supine Hip ABduction/ADduction: AAROM;Left;10 reps;Supine Straight Leg Raises: AAROM;Left;10 reps;Supine   PT Goals                                                          progressing    Visit Information  Last PT Received On: 01/30/12    Subjective Data  Subjective: I'm going to Rehab today Patient Stated Goal: Rehab   Cognition       Balance     End of Session PT - End of Session Equipment Utilized During Treatment: Gait belt Activity Tolerance: Patient tolerated treatment well Patient left: in chair;with call bell/phone within reach Nurse Communication: Mobility status   Felecia Shelling  PTA [facility]  Acute  Rehab Pager     [phone]

## 2012-01-30 NOTE — Progress Notes (Signed)
No new orders, please see discharge summary 01/29/12

## 2012-01-30 NOTE — Clinical Social Work Note (Signed)
Confirmed [employer] authorization received per Mindi Junker.  EMS authorization is: [id number].  CSW also confirmed with Jasmine December at [facility] that pt is to d/c to SNF.  Jasmine December stated she would need a new/updated d/c summary since the one she received was yesterday. If no new d/c summary appropriate then documentation from the RN or MD stating no changes in d/c plans would be acceptable.  CSW relayed this information to RN.  RN answered though unavailable (in a pt room).  RN will call this CSW back to discuss d/c.  Vickii Penna, LCSWA [phone]  Clinical Social Work

## 2012-01-30 NOTE — Progress Notes (Signed)
Patient ID: [name], female   DOB: 1931-01-27, 78 y.o.   MRN: [id number] Doing well.  Feels better. VSS. Medically stable.  Can discharge to SNF today.

## 2012-01-31 NOTE — Progress Notes (Signed)
Discharge summary sent to payer through [employer]  

## 2012-04-11 ENCOUNTER — Inpatient Hospital Stay (HOSPITAL_COMMUNITY)
Admission: EM | Admit: 2012-04-11 | Discharge: 2012-04-16 | DRG: 481 | Disposition: A | Discharge status: Skilled Nursing Facility | Payer: Medicare Other | Attending: Internal Medicine | Admitting: Internal Medicine

## 2012-04-11 ENCOUNTER — Encounter (HOSPITAL_COMMUNITY): Payer: Self-pay | Admitting: Emergency Medicine

## 2012-04-11 ENCOUNTER — Emergency Department (HOSPITAL_COMMUNITY): Payer: Medicare Other

## 2012-04-11 DIAGNOSIS — S72009A Fracture of unspecified part of neck of unspecified femur, initial encounter for closed fracture: Secondary | ICD-10-CM

## 2012-04-11 DIAGNOSIS — I2510 Atherosclerotic heart disease of native coronary artery without angina pectoris: Secondary | ICD-10-CM

## 2012-04-11 DIAGNOSIS — Z96619 Presence of unspecified artificial shoulder joint: Secondary | ICD-10-CM

## 2012-04-11 DIAGNOSIS — D696 Thrombocytopenia, unspecified: Secondary | ICD-10-CM | POA: Diagnosis present

## 2012-04-11 DIAGNOSIS — Z880 Allergy status to penicillin: Secondary | ICD-10-CM

## 2012-04-11 DIAGNOSIS — Z96649 Presence of unspecified artificial hip joint: Secondary | ICD-10-CM

## 2012-04-11 DIAGNOSIS — K2270 Barrett's esophagus without dysplasia: Secondary | ICD-10-CM

## 2012-04-11 DIAGNOSIS — E118 Type 2 diabetes mellitus with unspecified complications: Secondary | ICD-10-CM | POA: Diagnosis present

## 2012-04-11 DIAGNOSIS — E1165 Type 2 diabetes mellitus with hyperglycemia: Secondary | ICD-10-CM | POA: Diagnosis present

## 2012-04-11 DIAGNOSIS — W010XXA Fall on same level from slipping, tripping and stumbling without subsequent striking against object, initial encounter: Secondary | ICD-10-CM | POA: Diagnosis present

## 2012-04-11 DIAGNOSIS — B370 Candidal stomatitis: Secondary | ICD-10-CM | POA: Diagnosis not present

## 2012-04-11 DIAGNOSIS — H04129 Dry eye syndrome of unspecified lacrimal gland: Secondary | ICD-10-CM | POA: Diagnosis present

## 2012-04-11 DIAGNOSIS — Z8042 Family history of malignant neoplasm of prostate: Secondary | ICD-10-CM

## 2012-04-11 DIAGNOSIS — Y92009 Unspecified place in unspecified non-institutional (private) residence as the place of occurrence of the external cause: Secondary | ICD-10-CM

## 2012-04-11 DIAGNOSIS — Z9861 Coronary angioplasty status: Secondary | ICD-10-CM

## 2012-04-11 DIAGNOSIS — K219 Gastro-esophageal reflux disease without esophagitis: Secondary | ICD-10-CM

## 2012-04-11 DIAGNOSIS — IMO0002 Reserved for concepts with insufficient information to code with codable children: Secondary | ICD-10-CM

## 2012-04-11 DIAGNOSIS — M810 Age-related osteoporosis without current pathological fracture: Secondary | ICD-10-CM | POA: Diagnosis present

## 2012-04-11 DIAGNOSIS — I714 Abdominal aortic aneurysm, without rupture, unspecified: Secondary | ICD-10-CM

## 2012-04-11 DIAGNOSIS — S72143A Displaced intertrochanteric fracture of unspecified femur, initial encounter for closed fracture: Principal | ICD-10-CM | POA: Diagnosis present

## 2012-04-11 DIAGNOSIS — Z79899 Other long term (current) drug therapy: Secondary | ICD-10-CM

## 2012-04-11 DIAGNOSIS — A498 Other bacterial infections of unspecified site: Secondary | ICD-10-CM | POA: Diagnosis present

## 2012-04-11 DIAGNOSIS — E1169 Type 2 diabetes mellitus with other specified complication: Secondary | ICD-10-CM | POA: Diagnosis present

## 2012-04-11 DIAGNOSIS — N390 Urinary tract infection, site not specified: Secondary | ICD-10-CM | POA: Diagnosis present

## 2012-04-11 DIAGNOSIS — M169 Osteoarthritis of hip, unspecified: Secondary | ICD-10-CM

## 2012-04-11 DIAGNOSIS — R490 Dysphonia: Secondary | ICD-10-CM

## 2012-04-11 DIAGNOSIS — T428X5A Adverse effect of antiparkinsonism drugs and other central muscle-tone depressants, initial encounter: Secondary | ICD-10-CM | POA: Diagnosis not present

## 2012-04-11 DIAGNOSIS — R112 Nausea with vomiting, unspecified: Secondary | ICD-10-CM | POA: Diagnosis not present

## 2012-04-11 DIAGNOSIS — S0003XA Contusion of scalp, initial encounter: Secondary | ICD-10-CM | POA: Diagnosis present

## 2012-04-11 DIAGNOSIS — Z888 Allergy status to other drugs, medicaments and biological substances status: Secondary | ICD-10-CM

## 2012-04-11 DIAGNOSIS — D62 Acute posthemorrhagic anemia: Secondary | ICD-10-CM | POA: Diagnosis not present

## 2012-04-11 DIAGNOSIS — Y9210 Unspecified residential institution as the place of occurrence of the external cause: Secondary | ICD-10-CM | POA: Diagnosis not present

## 2012-04-11 DIAGNOSIS — Z8249 Family history of ischemic heart disease and other diseases of the circulatory system: Secondary | ICD-10-CM

## 2012-04-11 DIAGNOSIS — J449 Chronic obstructive pulmonary disease, unspecified: Secondary | ICD-10-CM

## 2012-04-11 DIAGNOSIS — E785 Hyperlipidemia, unspecified: Secondary | ICD-10-CM | POA: Diagnosis present

## 2012-04-11 DIAGNOSIS — Z803 Family history of malignant neoplasm of breast: Secondary | ICD-10-CM

## 2012-04-11 DIAGNOSIS — Z7901 Long term (current) use of anticoagulants: Secondary | ICD-10-CM

## 2012-04-11 DIAGNOSIS — J4489 Other specified chronic obstructive pulmonary disease: Secondary | ICD-10-CM | POA: Diagnosis present

## 2012-04-11 DIAGNOSIS — S72001A Fracture of unspecified part of neck of right femur, initial encounter for closed fracture: Secondary | ICD-10-CM

## 2012-04-11 DIAGNOSIS — I10 Essential (primary) hypertension: Secondary | ICD-10-CM

## 2012-04-11 DIAGNOSIS — Z833 Family history of diabetes mellitus: Secondary | ICD-10-CM

## 2012-04-11 DIAGNOSIS — D649 Anemia, unspecified: Secondary | ICD-10-CM

## 2012-04-11 DIAGNOSIS — Z87891 Personal history of nicotine dependence: Secondary | ICD-10-CM

## 2012-04-11 DIAGNOSIS — M069 Rheumatoid arthritis, unspecified: Secondary | ICD-10-CM

## 2012-04-11 LAB — CBC WITH DIFFERENTIAL/PLATELET
Basophils Absolute: 0 10*3/uL (ref 0.0–0.1)
Basophils Relative: 0 % (ref 0–1)
HCT: 37.4 % (ref 36.0–46.0)
Lymphocytes Relative: 14 % (ref 12–46)
Monocytes Absolute: 0.6 10*3/uL (ref 0.1–1.0)
Neutro Abs: 6.5 10*3/uL (ref 1.7–7.7)
Neutrophils Relative %: 76 % (ref 43–77)
RDW: 14.8 % (ref 11.5–15.5)
WBC: 8.5 10*3/uL (ref 4.0–10.5)

## 2012-04-11 LAB — BASIC METABOLIC PANEL
CO2: 24 mEq/L (ref 19–32)
Chloride: 100 mEq/L (ref 96–112)
Creatinine, Ser: 0.68 mg/dL (ref 0.50–1.10)
GFR calc Af Amer: >90 mL/min (ref >90)
Potassium: 3.6 mEq/L (ref 3.5–5.1)
Sodium: 136 mEq/L (ref 135–145)

## 2012-04-11 MED ORDER — HYDROXYCHLOROQUINE SULFATE 200 MG PO TABS
200.0000 mg | ORAL_TABLET | Freq: Every day | ORAL | Status: DC
  Administered 2012-04-12 – 2012-04-16 (×5): 200 mg via ORAL
  Filled 2012-04-11 (×5): qty 1

## 2012-04-11 MED ORDER — OMEPRAZOLE MAGNESIUM 20 MG PO TBEC: 20.0000 mg | DELAYED_RELEASE_TABLET | Freq: Every day | ORAL | Status: DC

## 2012-04-11 MED ORDER — OXYCODONE HCL 5 MG PO TABS
5.0000 mg | ORAL_TABLET | ORAL | Status: DC | PRN
  Administered 2012-04-12: 10 mg via ORAL
  Administered 2012-04-12: 5 mg via ORAL
  Administered 2012-04-12: 10 mg via ORAL
  Filled 2012-04-11 (×3): qty 2
  Filled 2012-04-11: qty 1

## 2012-04-11 MED ORDER — PROMETHAZINE HCL 12.5 MG PO TABS
12.5000 mg | ORAL_TABLET | Freq: Four times a day (QID) | ORAL | Status: DC | PRN
  Administered 2012-04-12: 12.5 mg via ORAL
  Filled 2012-04-11: qty 1

## 2012-04-11 MED ORDER — MORPHINE SULFATE 4 MG/ML IJ SOLN
4.0000 mg | Freq: Once | INTRAMUSCULAR | Status: AC
  Administered 2012-04-11: 4 mg via INTRAVENOUS
  Filled 2012-04-11: qty 1

## 2012-04-11 MED ORDER — SODIUM CHLORIDE 0.9 % IV SOLN
INTRAVENOUS | Status: AC
  Administered 2012-04-12: 20 mL/h via INTRAVENOUS

## 2012-04-11 MED ORDER — FENTANYL CITRATE 0.05 MG/ML IJ SOLN
50.0000 ug | Freq: Once | INTRAMUSCULAR | Status: AC
  Administered 2012-04-11: 50 ug via INTRAVENOUS
  Filled 2012-04-11: qty 2

## 2012-04-11 MED ORDER — FENTANYL CITRATE 0.05 MG/ML IJ SOLN: 50.0000 ug | INTRAMUSCULAR | Status: DC | PRN

## 2012-04-11 MED ORDER — MORPHINE SULFATE 2 MG/ML IJ SOLN
0.5000 mg | INTRAMUSCULAR | Status: DC | PRN
  Administered 2012-04-12 (×3): 0.5 mg via INTRAVENOUS
  Filled 2012-04-11 (×4): qty 1

## 2012-04-11 MED ORDER — SODIUM CHLORIDE 0.9 % IV SOLN: INTRAVENOUS | Status: DC

## 2012-04-11 MED ORDER — FENTANYL CITRATE 0.05 MG/ML IJ SOLN: 50.0000 ug | Freq: Once | INTRAMUSCULAR | Status: DC

## 2012-04-11 MED ORDER — AMLODIPINE BESYLATE 5 MG PO TABS
5.0000 mg | ORAL_TABLET | Freq: Every day | ORAL | Status: DC
  Filled 2012-04-11 (×2): qty 1

## 2012-04-11 MED ORDER — ONDANSETRON HCL 4 MG/2ML IJ SOLN
4.0000 mg | Freq: Once | INTRAMUSCULAR | Status: AC
  Administered 2012-04-11: 4 mg via INTRAVENOUS
  Filled 2012-04-11: qty 2

## 2012-04-11 MED ORDER — ASPIRIN 81 MG PO TABS: 81.0000 mg | ORAL_TABLET | Freq: Every day | ORAL | Status: DC

## 2012-04-11 NOTE — H&P (Addendum)
[facility] History and Physical  [name] WUJ:[id number] DOB: 03-20-1930 DOA: 04/11/2012  Referring physician: ER physician. PCP: Hoyle Sauer, MD  Specialists: Dr. Herbie Baltimore of [facility] heart and vascular.  Chief Complaint: Fall with right hip pain.  HPI: [name] is a 79 y.o. female with history of CAD status post stenting, abdominal aortic aneurysm status post repair, hypertension, rheumatoid arthritis was brought to the ER patient had a mechanical fall last evening while moving groceries. Patient hit her head but did not lose consciousness. Denies any focal deficits chest pain or shortness of breath. Patient had persistent right hip pain and in the ER x-rays revealed fracture of the right hip. Orthopedic surgeon Dr. Magnus Ivan has seen the patient and his planning surgery. Patient otherwise is not in acute distress. Patient has had left hip surgery last December, 3 months ago.  Review of Systems: The patient denies anorexia, fever, weight loss,, vision loss, decreased hearing, hoarseness, chest pain, syncope, dyspnea on exertion, peripheral edema, balance deficits, hemoptysis, abdominal pain, melena, hematochezia, severe indigestion/heartburn, hematuria, incontinence, genital sores, muscle weakness, suspicious skin lesions, transient blindness, difficulty walking, depression, unusual weight change, abnormal bleeding, enlarged lymph nodes, angioedema, and breast masses. Right hip pain.  Past Medical History  Diagnosis Date  . Angina   . Pneumonia 12/12/10  . Hiatal hernia   . Arthritis   . GERD (gastroesophageal reflux disease)   . COPD (chronic obstructive pulmonary disease)     pt. denies having this  . Iron deficiency anemia   . Barrett esophagus   . HTN (hypertension)   . DJD (degenerative joint disease)   . Osteoporosis   . HLD (hyperlipidemia)   . Internal hemorrhoids   . Diverticulosis   . Shortness of breath     MILD- WITH EXERTION  . Pain     RIGHT  SHOULDER WITH LIMITED ROM--STATES PREVIOUS RT TOTAL SHOULDDER REPLACEMENT-BUT STILL HAS PROBLEMS WITH SHOULDER  . Coronary artery disease    Past Surgical History  Procedure Laterality Date  . Back surgery      x 2  . Appendectomy      49yrs old  . Cardiac catheterization      stents  . Total shoulder replacement      right  . Cholecystectomy      several yrs ago  . Cataract extraction  2001, 2002    bilateral  . Total abdominal hysterectomy  1965  . Upper gastrointestinal endoscopy  multiple    w/bx, hiatal hernia, Barretts', esophageal polyp 1x  . Colonoscopy  05/09/2004; 06/08/1999    internal hemorrhoids; diverticulosis  . Rotator cuff repair      bilateral  . Lumbar spine surgery  October 2012    spinal stenosis, Dr. Ophelia Charter  . Abdominal aortic aneurysm repair  2004  . Coronary angioplasty  2004     STENT PLACEMENT RT CORONARY ARTERY AND CIRCUMFLEX ARTERY  . Left carpal tunnel release march 2013    . Total hip arthroplasty  01/26/2012    Procedure: TOTAL HIP ARTHROPLASTY ANTERIOR APPROACH;  Surgeon: Kathryne Hitch, MD;  Location: [facility];  Service: Orthopedics;  Laterality: Left;  Left Total Hip Arthroplasty, Anterior Approach (C-Arm)   Social History:  reports that she quit smoking about 11 years ago. Her smoking use included Cigarettes. She smoked 0.00 packs per day. She has never used smokeless tobacco. She reports that she does not drink alcohol or use illicit drugs. Lives at home. where does patient live--home, ALF, SNF?  and with whom if at home? Can do ADLs. Can patient participate in ADLs?  Allergies  Allergen Reactions  . Codeine Nausea And Vomiting, Swelling and Rash  . Hydrocodone Nausea And Vomiting, Swelling and Rash  . Morphine And Related Other (See Comments)    hallucinations  . Other Nausea And Vomiting    Most pain meds other than tramadol. Tylenol, Toradol, and Robaxin are ok.  PT STATES SHE NEEDS PAPER TAPE--SKIN VERY FRAGILE  . Penicillins  Hives  . Darvocet (Propoxyphene-Acetaminophen)   . Demerol (Meperidine)   . Statins     INTOLERANT OF STATINS    Family History  Problem Relation Age of Onset  . Prostate cancer Father   . Diabetes Mother   . Colon cancer Neg Hx   . Breast cancer Mother   . Heart disease Mother       Prior to Admission medications   Medication Sig Start Date End Date Taking? Authorizing Provider  acetaminophen (TYLENOL) 650 MG CR tablet Take 1,300 mg by mouth. IF NEEDED FOR PAIN   Yes Historical Provider, MD  amLODipine (NORVASC) 5 MG tablet Take 5 mg by mouth daily before breakfast.    Yes Historical Provider, MD  aspirin 81 MG tablet Take 81 mg by mouth daily.   Yes Historical Provider, MD  Calcium Carbonate-Vitamin D (CALTRATE 600+D) 600-400 MG-UNIT per tablet Take 1 tablet by mouth daily.    Yes Historical Provider, MD  Cholecalciferol (VITAMIN D3) 2000 UNITS TABS Take 1 tablet by mouth daily.   Yes Historical Provider, MD  Flaxseed, Linseed, (FLAX SEED OIL PO) Take 1 tablet by mouth daily. Hold while in hospital   Yes Historical Provider, MD  hydroxychloroquine (PLAQUENIL) 200 MG tablet Take 200 mg by mouth daily.    Yes Historical Provider, MD  Multiple Vitamins-Minerals (CENTRUM SILVER PO) Take 1 tablet by mouth daily.   Yes Historical Provider, MD  omeprazole (PRILOSEC OTC) 20 MG tablet Take 20 mg by mouth daily.   Yes Historical Provider, MD  promethazine (PHENERGAN) 12.5 MG tablet Take 12.5 mg by mouth every 6 (six) hours as needed. PT TAKES WITH TRAMADOL   Yes Historical Provider, MD  traMADol (ULTRAM) 50 MG tablet Take 1 tablet (50 mg total) by mouth every 6 (six) hours as needed for pain. Pain 01/29/12  Yes Kathryne Hitch, MD  vitamin C (ASCORBIC ACID) 500 MG tablet Take 500 mg by mouth daily.   Yes Historical Provider, MD   Physical Exam: Filed Vitals:   04/11/12 1944 04/11/12 2015  BP: 131/74 123/62  Pulse:  82  Temp: 98.3 F (36.8 C)   TempSrc: Oral   Resp: 27 14  SpO2:  100% 97%     General:  Well-developed well-nourished.  Eyes: Anicteric no pallor.  ENT: No discharge from ears eyes nose and mouth.  Neck: No mass felt.  Cardiovascular: S1-S2 heard.  Respiratory: No rhonchi and crepitations.  Abdomen: Soft nontender. Bowel sounds present.  Skin: No rash.  Musculoskeletal: Right lower extremity internally rotated. Pain on movement.  Psychiatric: Appears normal.  Neurologic: Moves all extremities.  Labs on Admission:  Basic Metabolic Panel:  Recent Labs Lab 04/11/12 1957  NA 136  K 3.6  CL 100  CO2 24  GLUCOSE 285*  BUN 22  CREATININE 0.68  CALCIUM 8.8   Liver Function Tests: No results found for this basename: AST, ALT, ALKPHOS, BILITOT, PROT, ALBUMIN,  in the last 168 hours No results found for this basename: LIPASE, AMYLASE,  in the last 168 hours No results found for this basename: AMMONIA,  in the last 168 hours CBC:  Recent Labs Lab 04/11/12 1957  WBC 8.5  NEUTROABS 6.5  HGB 12.5  HCT 37.4  MCV 88.6  PLT 126*   Cardiac Enzymes: No results found for this basename: CKTOTAL, CKMB, CKMBINDEX, TROPONINI,  in the last 168 hours  BNP (last 3 results) No results found for this basename: PROBNP,  in the last 8760 hours CBG: No results found for this basename: GLUCAP,  in the last 168 hours  Radiological Exams on Admission: Dg Hip Complete Right  04/11/2012  *RADIOLOGY REPORT*  Clinical Data: Right hip pain status post fall  RIGHT HIP - COMPLETE 2+ VIEW  Comparison: 07/15/2010 MRI  Findings: Comminuted fracture of the right femur with displacement of the lesser trochanter and subtrochanteric extension. The femoral head remains seated within the acetabulum though in a forced abducted position.  The distal dominant fragment is displaced proximally.  Moderate right hip DJD.  Diffuse osteopenia.  Left hip arthroplasty.  Advanced atherosclerotic disease.  Iliac stent.  IMPRESSION: Right inter/subtrochanteric femur fracture.   Per CMS PQRS reporting requirements (PQRS Measure 24): Given the patient's age of greater than 50 and the fracture site (hip, distal radius, or spine), the patient should be tested for osteoporosis using DXA, and the appropriate treatment considered based on the DXA results.   Original Report Authenticated By: Jearld Lesch, M.D.    Ct Head Wo Contrast  04/11/2012  *RADIOLOGY REPORT*  Clinical Data:  Larey Seat, head pain, neck pain.  CT HEAD WITHOUT CONTRAST CT CERVICAL SPINE WITHOUT CONTRAST  Technique:  Multidetector CT imaging of the head and cervical spine was performed following the standard protocol without intravenous contrast.  Multiplanar CT image reconstructions of the cervical spine were also generated.  Comparison:   None  CT HEAD  Findings: There is no evidence for acute infarction, intracranial hemorrhage, mass lesion, hydrocephalus, or extra-axial fluid.  Mild age related atrophy.  Mild chronic microvascular ischemic change. No skull fracture.  No subdural hematoma.  Large right parietal scalp hematoma.  Vascular calcification.  No acute sinus or mastoid fluid.  Negative orbits.  IMPRESSION: Chronic changes as described.  Large right parietal scalp hematoma. No skull fracture or intracranial hemorrhage.  CT CERVICAL SPINE  Findings: There is no visible cervical spine fracture or traumatic subluxation.  There is advanced disc space narrowing at C6-C7. Mild pannus.  Calcified central protrusion C3-C4 and C4-5.  Airway midline.  COPD. Advanced vascular calcification.  Mild scoliosis convex left.  Advanced facet arthropathy.  IMPRESSION: No visible cervical spine fracture or traumatic subluxation.   Original Report Authenticated By: Davonna Belling, M.D.    Ct Cervical Spine Wo Contrast  04/11/2012  *RADIOLOGY REPORT*  Clinical Data:  Larey Seat, head pain, neck pain.  CT HEAD WITHOUT CONTRAST CT CERVICAL SPINE WITHOUT CONTRAST  Technique:  Multidetector CT imaging of the head and cervical spine was performed  following the standard protocol without intravenous contrast.  Multiplanar CT image reconstructions of the cervical spine were also generated.  Comparison:   None  CT HEAD  Findings: There is no evidence for acute infarction, intracranial hemorrhage, mass lesion, hydrocephalus, or extra-axial fluid.  Mild age related atrophy.  Mild chronic microvascular ischemic change. No skull fracture.  No subdural hematoma.  Large right parietal scalp hematoma.  Vascular calcification.  No acute sinus or mastoid fluid.  Negative orbits.  IMPRESSION: Chronic changes as described.  Large right  parietal scalp hematoma. No skull fracture or intracranial hemorrhage.  CT CERVICAL SPINE  Findings: There is no visible cervical spine fracture or traumatic subluxation.  There is advanced disc space narrowing at C6-C7. Mild pannus.  Calcified central protrusion C3-C4 and C4-5.  Airway midline.  COPD. Advanced vascular calcification.  Mild scoliosis convex left.  Advanced facet arthropathy.  IMPRESSION: No visible cervical spine fracture or traumatic subluxation.   Original Report Authenticated By: Davonna Belling, M.D.     EKG: Independently reviewed. Normal sinus rhythm.  Assessment/Plan Principal Problem:   Closed right hip fracture Active Problems:   CAD (coronary artery disease)   COPD (chronic obstructive pulmonary disease)   HTN (hypertension)   AAA (abdominal aortic aneurysm)   Rheumatoid arthritis   1. Right hip fracture - in anticipation of possible surgery patient will be kept n.p.o. past midnight. Continue with pain relief medications. Further recommendations per orthopedic surgeon. 2. CAD status post stenting - denies any chest pain. EKG unremarkable. 3. Hypertension - continue present medications. 4. Rheumatoid arthritis - continue Plaquenil. 5. COPD - presently not wheezing. 6. History of abdominal aortic aneurysm repair - present denies any abdominal pain. 7. Scalp hematoma secondary to fall.  Dr.  Magnus Ivan orthopedic surgeon is consulted. if consultant consulted, please document name and whether formally or informally consulted  Code Status: Full code.  Family Communication: Daughter at the bedside.  Disposition Plan: Admit to inpatient.   Kiron Osmun N. [facility] Pager [phone].  If 7PM-7AM, please contact night-coverage [email or web address] Password [facility] 04/11/2012, 11:22 PM

## 2012-04-11 NOTE — ED Provider Notes (Signed)
Patient suffered ground-level fall, tripping on her driveway. She injured her right hip and struck her head as a result of the fall. Complains of right hip pain and a bump in her occiput on exam appears uncomfortable Glasgow Coma Score 15 HEENT exam there is a hematoma at the right occipital scalp neck is supple and nontender lungs clear auscultation pelvis stable there is deformity at the right hip, with tenderness right lower extremity is neurovascularly intact. Hip x-ray reviewed by me Case discussed with Dr. Magnus Ivan plan admit to medical service he will consult and plan on ORIF of right hip for tomorrow  Doug Sou, MD 04/11/12 2131

## 2012-04-11 NOTE — ED Notes (Signed)
Per EMS- Pt reports getting out of car when she fell. Pt fell onto concrete driveway onto her R Hip. Pt has R hip pain and small deformity noted. Hematoma to occipital lobe of head. Pt currently Ax4, NAD at this time.

## 2012-04-11 NOTE — Consult Note (Signed)
Reason for Consult:  Right hip fracture Referring Physician: ER MD  [name] is an 79 y.o. female.  HPI:   79 yo female well known to me who sustained an accidental fall in her driveway this evening when trying to help get groceries into the house.  She fell directly on her right hip and hit her head on the driveway.  She denies any LOC and denies any syncopal episode before she fell.  With severe right hip pain, an obvious deformity, and the inability to get up, EMS was called and brought her in to the ER.  Past Medical History  Diagnosis Date  . Angina   . Pneumonia 12/12/10  . Hiatal hernia   . Arthritis   . GERD (gastroesophageal reflux disease)   . COPD (chronic obstructive pulmonary disease)     pt. denies having this  . Iron deficiency anemia   . Barrett esophagus   . HTN (hypertension)   . DJD (degenerative joint disease)   . Osteoporosis   . HLD (hyperlipidemia)   . Internal hemorrhoids   . Diverticulosis   . Shortness of breath     MILD- WITH EXERTION  . Pain     RIGHT SHOULDER WITH LIMITED ROM--STATES PREVIOUS RT TOTAL SHOULDDER REPLACEMENT-BUT STILL HAS PROBLEMS WITH SHOULDER  . Coronary artery disease     Past Surgical History  Procedure Laterality Date  . Back surgery      x 2  . Appendectomy      52yrs old  . Cardiac catheterization      stents  . Total shoulder replacement      right  . Cholecystectomy      several yrs ago  . Cataract extraction  2001, 2002    bilateral  . Total abdominal hysterectomy  1965  . Upper gastrointestinal endoscopy  multiple    w/bx, hiatal hernia, Barretts', esophageal polyp 1x  . Colonoscopy  05/09/2004; 06/08/1999    internal hemorrhoids; diverticulosis  . Rotator cuff repair      bilateral  . Lumbar spine surgery  October 2012    spinal stenosis, Dr. Ophelia Charter  . Abdominal aortic aneurysm repair  2004  . Coronary angioplasty  2004     STENT PLACEMENT RT CORONARY ARTERY AND CIRCUMFLEX ARTERY  . Left carpal tunnel  release march 2013    . Total hip arthroplasty  01/26/2012    Procedure: TOTAL HIP ARTHROPLASTY ANTERIOR APPROACH;  Surgeon: Kathryne Hitch, MD;  Location: [facility];  Service: Orthopedics;  Laterality: Left;  Left Total Hip Arthroplasty, Anterior Approach (C-Arm)    Family History  Problem Relation Age of Onset  . Prostate cancer Father   . Diabetes Mother   . Colon cancer Neg Hx   . Breast cancer Mother   . Heart disease Mother     Social History:  reports that she quit smoking about 11 years ago. Her smoking use included Cigarettes. She smoked 0.00 packs per day. She has never used smokeless tobacco. She reports that she does not drink alcohol or use illicit drugs.  Allergies:  Allergies  Allergen Reactions  . Codeine Nausea And Vomiting, Swelling and Rash  . Hydrocodone Nausea And Vomiting, Swelling and Rash  . Morphine And Related Other (See Comments)    hallucinations  . Other Nausea And Vomiting    Most pain meds other than tramadol. Tylenol, Toradol, and Robaxin are ok.  PT STATES SHE NEEDS PAPER TAPE--SKIN VERY FRAGILE  . Penicillins Hives  .  Darvocet (Propoxyphene-Acetaminophen)   . Demerol (Meperidine)   . Statins     INTOLERANT OF STATINS    Medications: I have reviewed the patient's current medications.  Results for orders placed during the hospital encounter of 04/11/12 (from the past 48 hour(s))  CBC WITH DIFFERENTIAL     Status: Abnormal   Collection Time    04/11/12  7:57 PM      Result Value Range   WBC 8.5  4.0 - 10.5 K/uL   RBC 4.22  3.87 - 5.11 MIL/uL   Hemoglobin 12.5  12.0 - 15.0 g/dL   HCT 16.1  09.6 - 04.5 %   MCV 88.6  78.0 - 100.0 fL   MCH 29.6  26.0 - 34.0 pg   MCHC 33.4  30.0 - 36.0 g/dL   RDW 40.9  81.1 - 91.4 %   Platelets 126 (*) 150 - 400 K/uL   Neutrophils Relative 76  43 - 77 %   Neutro Abs 6.5  1.7 - 7.7 K/uL   Lymphocytes Relative 14  12 - 46 %   Lymphs Abs 1.2  0.7 - 4.0 K/uL   Monocytes Relative 8  3 - 12 %   Monocytes  Absolute 0.6  0.1 - 1.0 K/uL   Eosinophils Relative 2  0 - 5 %   Eosinophils Absolute 0.2  0.0 - 0.7 K/uL   Basophils Relative 0  0 - 1 %   Basophils Absolute 0.0  0.0 - 0.1 K/uL  BASIC METABOLIC PANEL     Status: Abnormal   Collection Time    04/11/12  7:57 PM      Result Value Range   Sodium 136  135 - 145 mEq/L   Potassium 3.6  3.5 - 5.1 mEq/L   Chloride 100  96 - 112 mEq/L   CO2 24  19 - 32 mEq/L   Glucose, Bld 285 (*) 70 - 99 mg/dL   BUN 22  6 - 23 mg/dL   Creatinine, Ser 7.82  0.50 - 1.10 mg/dL   Calcium 8.8  8.4 - 95.6 mg/dL   GFR calc non Af Amer 80 (*) >90 mL/min   GFR calc Af Amer >90  >90 mL/min   Comment:            The eGFR has been calculated     using the CKD EPI equation.     This calculation has not been     validated in all clinical     situations.     eGFR's persistently     <90 mL/min signify     possible Chronic Kidney Disease.    Dg Hip Complete Right  04/11/2012  *RADIOLOGY REPORT*  Clinical Data: Right hip pain status post fall  RIGHT HIP - COMPLETE 2+ VIEW  Comparison: 07/15/2010 MRI  Findings: Comminuted fracture of the right femur with displacement of the lesser trochanter and subtrochanteric extension. The femoral head remains seated within the acetabulum though in a forced abducted position.  The distal dominant fragment is displaced proximally.  Moderate right hip DJD.  Diffuse osteopenia.  Left hip arthroplasty.  Advanced atherosclerotic disease.  Iliac stent.  IMPRESSION: Right inter/subtrochanteric femur fracture.  Per CMS PQRS reporting requirements (PQRS Measure 24): Given the patient's age of greater than 50 and the fracture site (hip, distal radius, or spine), the patient should be tested for osteoporosis using DXA, and the appropriate treatment considered based on the DXA results.   Original Report Authenticated By: Greig Castilla  DelGaizo, M.D.    Ct Head Wo Contrast  04/11/2012  *RADIOLOGY REPORT*  Clinical Data:  Larey Seat, head pain, neck pain.  CT HEAD  WITHOUT CONTRAST CT CERVICAL SPINE WITHOUT CONTRAST  Technique:  Multidetector CT imaging of the head and cervical spine was performed following the standard protocol without intravenous contrast.  Multiplanar CT image reconstructions of the cervical spine were also generated.  Comparison:   None  CT HEAD  Findings: There is no evidence for acute infarction, intracranial hemorrhage, mass lesion, hydrocephalus, or extra-axial fluid.  Mild age related atrophy.  Mild chronic microvascular ischemic change. No skull fracture.  No subdural hematoma.  Large right parietal scalp hematoma.  Vascular calcification.  No acute sinus or mastoid fluid.  Negative orbits.  IMPRESSION: Chronic changes as described.  Large right parietal scalp hematoma. No skull fracture or intracranial hemorrhage.  CT CERVICAL SPINE  Findings: There is no visible cervical spine fracture or traumatic subluxation.  There is advanced disc space narrowing at C6-C7. Mild pannus.  Calcified central protrusion C3-C4 and C4-5.  Airway midline.  COPD. Advanced vascular calcification.  Mild scoliosis convex left.  Advanced facet arthropathy.  IMPRESSION: No visible cervical spine fracture or traumatic subluxation.   Original Report Authenticated By: Davonna Belling, M.D.    Ct Cervical Spine Wo Contrast  04/11/2012  *RADIOLOGY REPORT*  Clinical Data:  Larey Seat, head pain, neck pain.  CT HEAD WITHOUT CONTRAST CT CERVICAL SPINE WITHOUT CONTRAST  Technique:  Multidetector CT imaging of the head and cervical spine was performed following the standard protocol without intravenous contrast.  Multiplanar CT image reconstructions of the cervical spine were also generated.  Comparison:   None  CT HEAD  Findings: There is no evidence for acute infarction, intracranial hemorrhage, mass lesion, hydrocephalus, or extra-axial fluid.  Mild age related atrophy.  Mild chronic microvascular ischemic change. No skull fracture.  No subdural hematoma.  Large right parietal scalp  hematoma.  Vascular calcification.  No acute sinus or mastoid fluid.  Negative orbits.  IMPRESSION: Chronic changes as described.  Large right parietal scalp hematoma. No skull fracture or intracranial hemorrhage.  CT CERVICAL SPINE  Findings: There is no visible cervical spine fracture or traumatic subluxation.  There is advanced disc space narrowing at C6-C7. Mild pannus.  Calcified central protrusion C3-C4 and C4-5.  Airway midline.  COPD. Advanced vascular calcification.  Mild scoliosis convex left.  Advanced facet arthropathy.  IMPRESSION: No visible cervical spine fracture or traumatic subluxation.   Original Report Authenticated By: Davonna Belling, M.D.     ROS Blood pressure 131/74, temperature 98.3 F (36.8 C), temperature source Oral, resp. rate 27, SpO2 100.00%. Physical Exam  Musculoskeletal:       Right hip: She exhibits decreased range of motion, bony tenderness and deformity.  She has a warm-well perfused right foot.  She can move her toes, but is very uncomfortable with any hip motion on the right. Exam of her left hip shows good motion and her prosthesis is in place.  Her left lower ext is otherwise normal. She moves her arms/hand easily with intact motor and sensory exam.  The is a small abrasion at her left elbow. Her neck is slightly tender with motion and palpation. She was a large right-posterior scalp hematoma.  Assessment/Plan: Complex, severely displaced right intertrochanteric hip/femur fracture. 1) This fracture will need open reduction/internal fixation with likely a rod/hip screw construct.  I have talked to her and her family in length about this.  We will have  [facility] see her for medical management and clearance for surgery.  She has recently seen her cardiologist for blood pressure medication adjustments and actually did quite well after her elective anterior hip replacement of her left hip this past December.  Will let her eat tonight from my standpoint and  put her on the OR schedule for tomorrow pending medical clearance.  Kathryne Hitch 04/11/2012, 9:40 PM

## 2012-04-11 NOTE — ED Provider Notes (Signed)
History     CSN: [id number]  Arrival date & time 04/11/12  1941   First MD Initiated Contact with Patient 04/11/12 1946      Chief Complaint  Patient presents with  . Hip Pain    (Consider location/radiation/quality/duration/timing/severity/associated sxs/prior treatment) The history is provided by the patient, a relative and medical records.    [name] is a 79 y.o. female  with a hx of HTN, PNA, IDA, arthritis presents to the Emergency Department complaining of acute mechanical fall onset 1 hour PTA resulting in significant R hip pain.  Pt arrived by EMS with full immobilization.  Pt without neck or back pain.  Associated symptoms include shortening and rotation of R leg.  Pt with L hip replacement by Dr Magnus Ivan in Dec without complication.  Nothing makes it better and movement, palpation makes it worse.  Pt denies ever, chills, headache, neck pain, chest pain, shortness of breath, abdominal pain, nausea, vomiting, diarrhea, weakness, dizziness, syncope, loss of consciousness.     Past Medical History  Diagnosis Date  . Angina   . Pneumonia 12/12/10  . Hiatal hernia   . Arthritis   . GERD (gastroesophageal reflux disease)   . COPD (chronic obstructive pulmonary disease)     pt. denies having this  . Iron deficiency anemia   . Barrett esophagus   . HTN (hypertension)   . DJD (degenerative joint disease)   . Osteoporosis   . HLD (hyperlipidemia)   . Internal hemorrhoids   . Diverticulosis   . Shortness of breath     MILD- WITH EXERTION  . Pain     RIGHT SHOULDER WITH LIMITED ROM--STATES PREVIOUS RT TOTAL SHOULDDER REPLACEMENT-BUT STILL HAS PROBLEMS WITH SHOULDER  . Coronary artery disease     Past Surgical History  Procedure Laterality Date  . Back surgery      x 2  . Appendectomy      82yrs old  . Cardiac catheterization      stents  . Total shoulder replacement      right  . Cholecystectomy      several yrs ago  . Cataract extraction  2001, 2002   bilateral  . Total abdominal hysterectomy  1965  . Upper gastrointestinal endoscopy  multiple    w/bx, hiatal hernia, Barretts', esophageal polyp 1x  . Colonoscopy  05/09/2004; 06/08/1999    internal hemorrhoids; diverticulosis  . Rotator cuff repair      bilateral  . Lumbar spine surgery  October 2012    spinal stenosis, Dr. Ophelia Charter  . Abdominal aortic aneurysm repair  2004  . Coronary angioplasty  2004     STENT PLACEMENT RT CORONARY ARTERY AND CIRCUMFLEX ARTERY  . Left carpal tunnel release march 2013    . Total hip arthroplasty  01/26/2012    Procedure: TOTAL HIP ARTHROPLASTY ANTERIOR APPROACH;  Surgeon: Kathryne Hitch, MD;  Location: [facility];  Service: Orthopedics;  Laterality: Left;  Left Total Hip Arthroplasty, Anterior Approach (C-Arm)    Family History  Problem Relation Age of Onset  . Prostate cancer Father   . Diabetes Mother   . Colon cancer Neg Hx   . Breast cancer Mother   . Heart disease Mother     History  Substance Use Topics  . Smoking status: Former Smoker    Types: Cigarettes    Quit date: 04/20/2000  . Smokeless tobacco: Never Used  . Alcohol Use: No    OB History   Grav Para Term  Preterm Abortions TAB SAB Ect Mult Living                  Review of Systems  Constitutional: Negative for fever, diaphoresis, appetite change, fatigue and unexpected weight change.  HENT: Negative for mouth sores and neck stiffness.   Eyes: Negative for visual disturbance.  Respiratory: Negative for cough, chest tightness, shortness of breath and wheezing.   Cardiovascular: Negative for chest pain.  Gastrointestinal: Negative for nausea, vomiting, abdominal pain, diarrhea and constipation.  Endocrine: Negative for polydipsia, polyphagia and polyuria.  Genitourinary: Negative for dysuria, urgency, frequency and hematuria.  Musculoskeletal: Positive for joint swelling, arthralgias and gait problem. Negative for back pain.  Skin: Positive for wound. Negative for rash.   Allergic/Immunologic: Negative for immunocompromised state.  Neurological: Negative for syncope, light-headedness and headaches.  Hematological: Does not bruise/bleed easily.  Psychiatric/Behavioral: Negative for sleep disturbance. The patient is not nervous/anxious.   All other systems reviewed and are negative.    Allergies  Codeine; Hydrocodone; Morphine and related; Other; Penicillins; Darvocet; Demerol; and Statins  Home Medications   Current Outpatient Rx  Name  Route  Sig  Dispense  Refill  . acetaminophen (TYLENOL) 650 MG CR tablet   Oral   Take 1,300 mg by mouth. IF NEEDED FOR PAIN         . amLODipine (NORVASC) 5 MG tablet   Oral   Take 5 mg by mouth daily before breakfast.          . aspirin 81 MG tablet   Oral   Take 81 mg by mouth daily.         . Calcium Carbonate-Vitamin D (CALTRATE 600+D) 600-400 MG-UNIT per tablet   Oral   Take 1 tablet by mouth daily.          . Cholecalciferol (VITAMIN D3) 2000 UNITS TABS   Oral   Take 1 tablet by mouth daily.         . Flaxseed, Linseed, (FLAX SEED OIL PO)   Oral   Take 1 tablet by mouth daily. Hold while in hospital         . hydroxychloroquine (PLAQUENIL) 200 MG tablet   Oral   Take 200 mg by mouth daily.          . Multiple Vitamins-Minerals (CENTRUM SILVER PO)   Oral   Take 1 tablet by mouth daily.         [name] omeprazole (PRILOSEC OTC) 20 MG tablet   Oral   Take 20 mg by mouth daily.         . promethazine (PHENERGAN) 12.5 MG tablet   Oral   Take 12.5 mg by mouth every 6 (six) hours as needed. PT TAKES WITH TRAMADOL         . traMADol (ULTRAM) 50 MG tablet   Oral   Take 1 tablet (50 mg total) by mouth every 6 (six) hours as needed for pain. Pain   30 tablet   0   . vitamin C (ASCORBIC ACID) 500 MG tablet   Oral   Take 500 mg by mouth daily.           BP 123/62  Pulse 82  Temp(Src) 98.3 F (36.8 C) (Oral)  Resp 14  SpO2 97%  Physical Exam  Nursing note and vitals  reviewed. Constitutional: She is oriented to person, place, and time. She appears well-developed and well-nourished. No distress.  HENT:  Head: Normocephalic. Head is with abrasion and with  contusion.    Right Ear: Tympanic membrane, external ear and ear canal normal.  Left Ear: Tympanic membrane, external ear and ear canal normal.  Nose: Nose normal. No mucosal edema or rhinorrhea.  Mouth/Throat: Uvula is midline, oropharynx is clear and moist and mucous membranes are normal. Mucous membranes are not cyanotic. No oropharyngeal exudate, posterior oropharyngeal edema, posterior oropharyngeal erythema or tonsillar abscesses.  Large hematoma into the right occiput with abrasion  Eyes: Conjunctivae and EOM are normal. Pupils are equal, round, and reactive to light. No scleral icterus.  Neck: Normal range of motion and full passive range of motion without pain. Neck supple. No spinous process tenderness and no muscular tenderness present. Normal range of motion present.  Cardiovascular: Normal rate, S1 normal, S2 normal, normal heart sounds and intact distal pulses.  Exam reveals no gallop and no friction rub.   No murmur heard. Pulses:      Radial pulses are 2+ on the right side, and 2+ on the left side.       Dorsalis pedis pulses are 2+ on the right side, and 2+ on the left side.       Posterior tibial pulses are 2+ on the right side, and 2+ on the left side.  Nicholaus Bloom refill less than 3 seconds of [place] his  Pulmonary/Chest: Effort normal and breath sounds normal. No respiratory distress. She has no wheezes. She has no rales. She exhibits no tenderness.  Abdominal: Soft. Bowel sounds are normal. She exhibits no distension and no mass. There is no tenderness. There is no rebound and no guarding.  Musculoskeletal:       Right hip: She exhibits decreased range of motion, decreased strength, tenderness, bony tenderness and deformity.       Right ankle: She exhibits normal range of motion, no  swelling, no ecchymosis, no deformity, no laceration and normal pulse. No tenderness.       Feet:  Small abrasion noted to the right ankle  Lymphadenopathy:    She has no cervical adenopathy.  Neurological: She is alert and oriented to person, place, and time. No cranial nerve deficit. She exhibits normal muscle tone. Coordination normal.  Speech is clear and goal oriented Moves extremities without ataxia Sensation intact to lower extremity is bilaterally  Skin: Skin is warm and dry. She is not diaphoretic. No erythema.  Psychiatric: She has a normal mood and affect.    ED Course  Procedures (including critical care time)  Labs Reviewed  CBC WITH DIFFERENTIAL - Abnormal; Notable for the following:    Platelets 126 (*)    All other components within normal limits  BASIC METABOLIC PANEL - Abnormal; Notable for the following:    Glucose, Bld 285 (*)    GFR calc non Af Amer 80 (*)    All other components within normal limits  URINALYSIS, MICROSCOPIC ONLY   Dg Hip Complete Right  04/11/2012  *RADIOLOGY REPORT*  Clinical Data: Right hip pain status post fall  RIGHT HIP - COMPLETE 2+ VIEW  Comparison: 07/15/2010 MRI  Findings: Comminuted fracture of the right femur with displacement of the lesser trochanter and subtrochanteric extension. The femoral head remains seated within the acetabulum though in a forced abducted position.  The distal dominant fragment is displaced proximally.  Moderate right hip DJD.  Diffuse osteopenia.  Left hip arthroplasty.  Advanced atherosclerotic disease.  Iliac stent.  IMPRESSION: Right inter/subtrochanteric femur fracture.  Per CMS PQRS reporting requirements (PQRS Measure 24): Given the patient's age of  greater than 50 and the fracture site (hip, distal radius, or spine), the patient should be tested for osteoporosis using DXA, and the appropriate treatment considered based on the DXA results.   Original Report Authenticated By: Jearld Lesch, M.D.    Ct  Head Wo Contrast  04/11/2012  *RADIOLOGY REPORT*  Clinical Data:  Larey Seat, head pain, neck pain.  CT HEAD WITHOUT CONTRAST CT CERVICAL SPINE WITHOUT CONTRAST  Technique:  Multidetector CT imaging of the head and cervical spine was performed following the standard protocol without intravenous contrast.  Multiplanar CT image reconstructions of the cervical spine were also generated.  Comparison:   None  CT HEAD  Findings: There is no evidence for acute infarction, intracranial hemorrhage, mass lesion, hydrocephalus, or extra-axial fluid.  Mild age related atrophy.  Mild chronic microvascular ischemic change. No skull fracture.  No subdural hematoma.  Large right parietal scalp hematoma.  Vascular calcification.  No acute sinus or mastoid fluid.  Negative orbits.  IMPRESSION: Chronic changes as described.  Large right parietal scalp hematoma. No skull fracture or intracranial hemorrhage.  CT CERVICAL SPINE  Findings: There is no visible cervical spine fracture or traumatic subluxation.  There is advanced disc space narrowing at C6-C7. Mild pannus.  Calcified central protrusion C3-C4 and C4-5.  Airway midline.  COPD. Advanced vascular calcification.  Mild scoliosis convex left.  Advanced facet arthropathy.  IMPRESSION: No visible cervical spine fracture or traumatic subluxation.   Original Report Authenticated By: Davonna Belling, M.D.    Ct Cervical Spine Wo Contrast  04/11/2012  *RADIOLOGY REPORT*  Clinical Data:  Larey Seat, head pain, neck pain.  CT HEAD WITHOUT CONTRAST CT CERVICAL SPINE WITHOUT CONTRAST  Technique:  Multidetector CT imaging of the head and cervical spine was performed following the standard protocol without intravenous contrast.  Multiplanar CT image reconstructions of the cervical spine were also generated.  Comparison:   None  CT HEAD  Findings: There is no evidence for acute infarction, intracranial hemorrhage, mass lesion, hydrocephalus, or extra-axial fluid.  Mild age related atrophy.  Mild chronic  microvascular ischemic change. No skull fracture.  No subdural hematoma.  Large right parietal scalp hematoma.  Vascular calcification.  No acute sinus or mastoid fluid.  Negative orbits.  IMPRESSION: Chronic changes as described.  Large right parietal scalp hematoma. No skull fracture or intracranial hemorrhage.  CT CERVICAL SPINE  Findings: There is no visible cervical spine fracture or traumatic subluxation.  There is advanced disc space narrowing at C6-C7. Mild pannus.  Calcified central protrusion C3-C4 and C4-5.  Airway midline.  COPD. Advanced vascular calcification.  Mild scoliosis convex left.  Advanced facet arthropathy.  IMPRESSION: No visible cervical spine fracture or traumatic subluxation.   Original Report Authenticated By: Davonna Belling, M.D.    ECG:  Date: 04/11/2012  Rate: 85  Rhythm: normal sinus rhythm  QRS Axis: normal  Intervals: normal  ST/T Wave abnormalities: normal  Conduction Disutrbances:none  Narrative Interpretation: NSR, nonischemic ECG  Old EKG Reviewed: none available    1. Hip fracture requiring operative repair, right, closed, initial encounter   2. CAD (coronary artery disease)   3. HTN (hypertension)       MDM  Raelyn Number for mechanical fall.  Concern for right hip fracture.  Will obtain head CT, cervical spine CT and right hip x-ray.  CT without evidence of infarction, intracranial hemorrhage. Note is a large right parietal scalp hematoma. CT cervical spine without fracture or subluxation.  Right hip x-ray with  intertrochanteric femur fracture.  Discussed with Dr. Magnus Ivan. He states patient is to be a medical admission, he will take her to surgery tomorrow. Pt admitted to [facility].       Dahlia Client Ellisha Bankson, PA-C 04/11/12 2155

## 2012-04-12 ENCOUNTER — Inpatient Hospital Stay (HOSPITAL_COMMUNITY): Payer: Medicare Other

## 2012-04-12 ENCOUNTER — Observation Stay (HOSPITAL_COMMUNITY): Payer: Medicare Other

## 2012-04-12 ENCOUNTER — Inpatient Hospital Stay (HOSPITAL_COMMUNITY): Payer: Medicare Other | Admitting: Anesthesiology

## 2012-04-12 ENCOUNTER — Encounter (HOSPITAL_COMMUNITY): Payer: Self-pay | Admitting: Anesthesiology

## 2012-04-12 ENCOUNTER — Encounter (HOSPITAL_COMMUNITY)
Admission: EM | Disposition: A | Discharge status: Skilled Nursing Facility | Payer: Self-pay | Source: Home / Self Care | Attending: Internal Medicine

## 2012-04-12 ENCOUNTER — Encounter (HOSPITAL_COMMUNITY): Payer: Self-pay | Admitting: Certified Registered"

## 2012-04-12 HISTORY — PX: INTRAMEDULLARY (IM) NAIL INTERTROCHANTERIC: SHX5875

## 2012-04-12 LAB — URINALYSIS, MICROSCOPIC ONLY
Glucose, UA: NEGATIVE mg/dL
Hgb urine dipstick: NEGATIVE
Specific Gravity, Urine: 1.024 (ref 1.005–1.030)
Urobilinogen, UA: 0.2 mg/dL (ref 0.0–1.0)
pH: 6 (ref 5.0–8.0)

## 2012-04-12 LAB — GLUCOSE, CAPILLARY: Glucose-Capillary: 155 mg/dL — ABNORMAL HIGH (ref 70–99)

## 2012-04-12 LAB — BASIC METABOLIC PANEL
BUN: 20 mg/dL (ref 6–23)
CO2: 26 mEq/L (ref 19–32)
Calcium: 8.6 mg/dL (ref 8.4–10.5)
Chloride: 104 mEq/L (ref 96–112)
Creatinine, Ser: 0.57 mg/dL (ref 0.50–1.10)
Glucose, Bld: 179 mg/dL — ABNORMAL HIGH (ref 70–99)

## 2012-04-12 LAB — APTT: aPTT: 28 seconds (ref 24–37)

## 2012-04-12 LAB — CBC
HCT: 33 % — ABNORMAL LOW (ref 36.0–46.0)
Hemoglobin: 11 g/dL — ABNORMAL LOW (ref 12.0–15.0)
MCH: 29.3 pg (ref 26.0–34.0)
MCV: 87.8 fL (ref 78.0–100.0)
RBC: 3.76 MIL/uL — ABNORMAL LOW (ref 3.87–5.11)

## 2012-04-12 LAB — SURGICAL PCR SCREEN
MRSA, PCR: NEGATIVE
Staphylococcus aureus: NEGATIVE

## 2012-04-12 LAB — HEMOGLOBIN A1C: Mean Plasma Glucose: 148 mg/dL — ABNORMAL HIGH (ref <117)

## 2012-04-12 LAB — PROTIME-INR: INR: 1.08 (ref 0.00–1.49)

## 2012-04-12 IMAGING — RF DG HIP OPERATIVE*R*
1 series · 4 of 4 positions shown · non-contrast
Comparison: 1 day prior

CLINICAL DATA: Right hip fracture.

DG OPERATIVE RIGHT HIP

[Series 1: run · 4 of 4 slices shown]
[im 1/4]
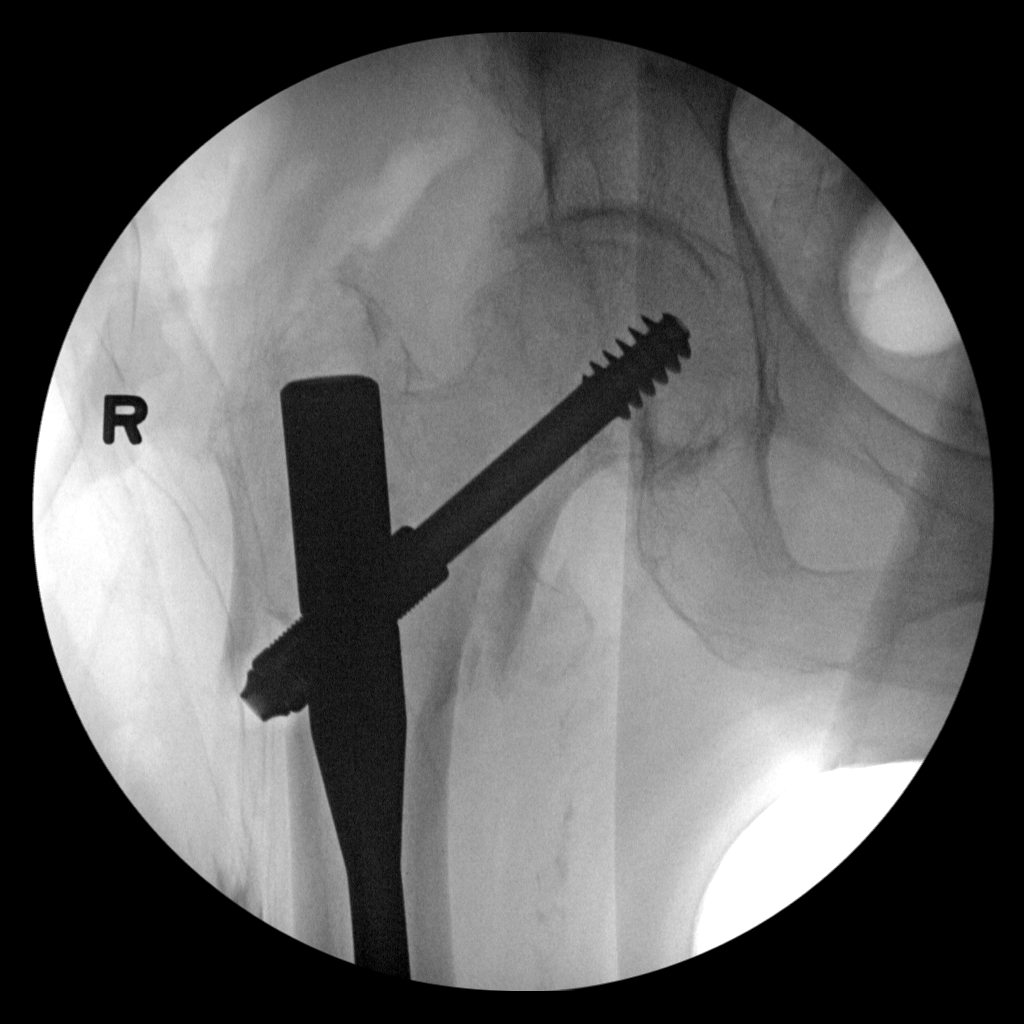
[im 2/4]
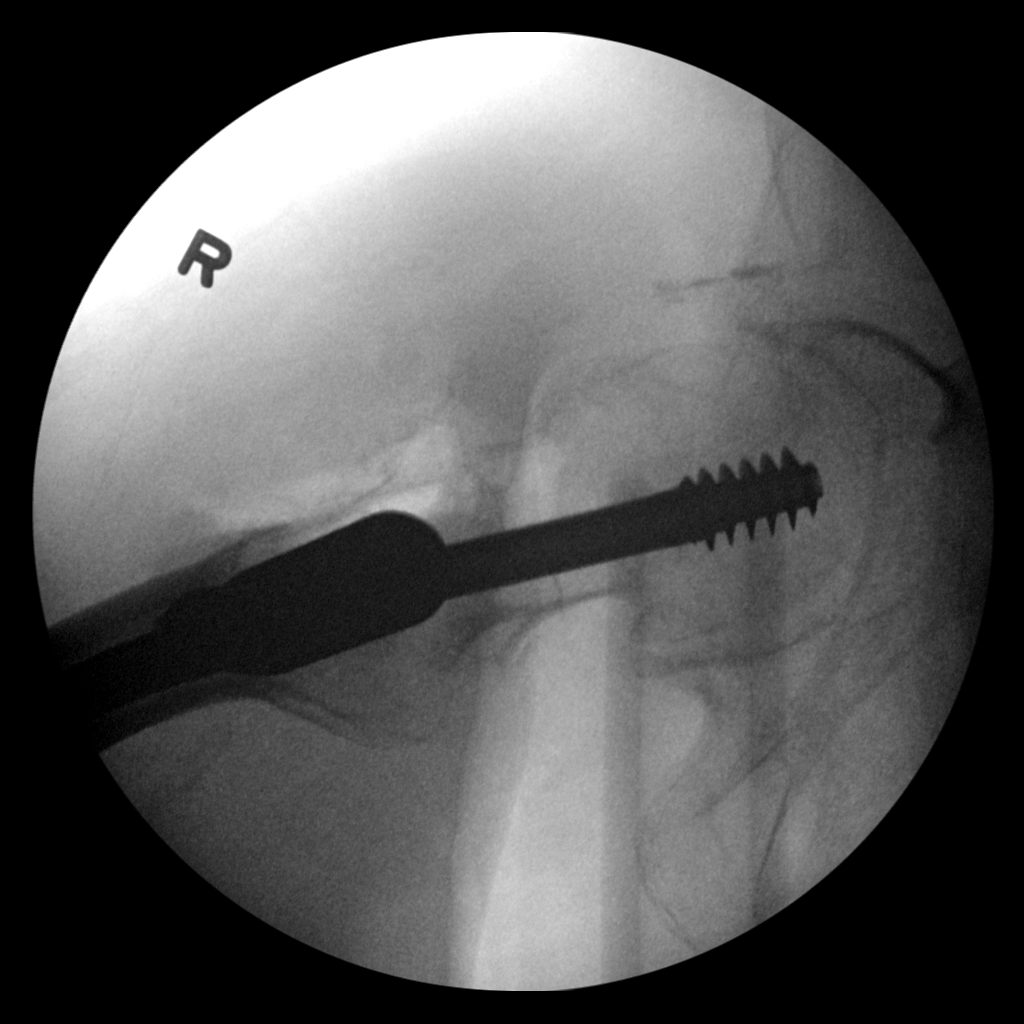
[im 3/4]
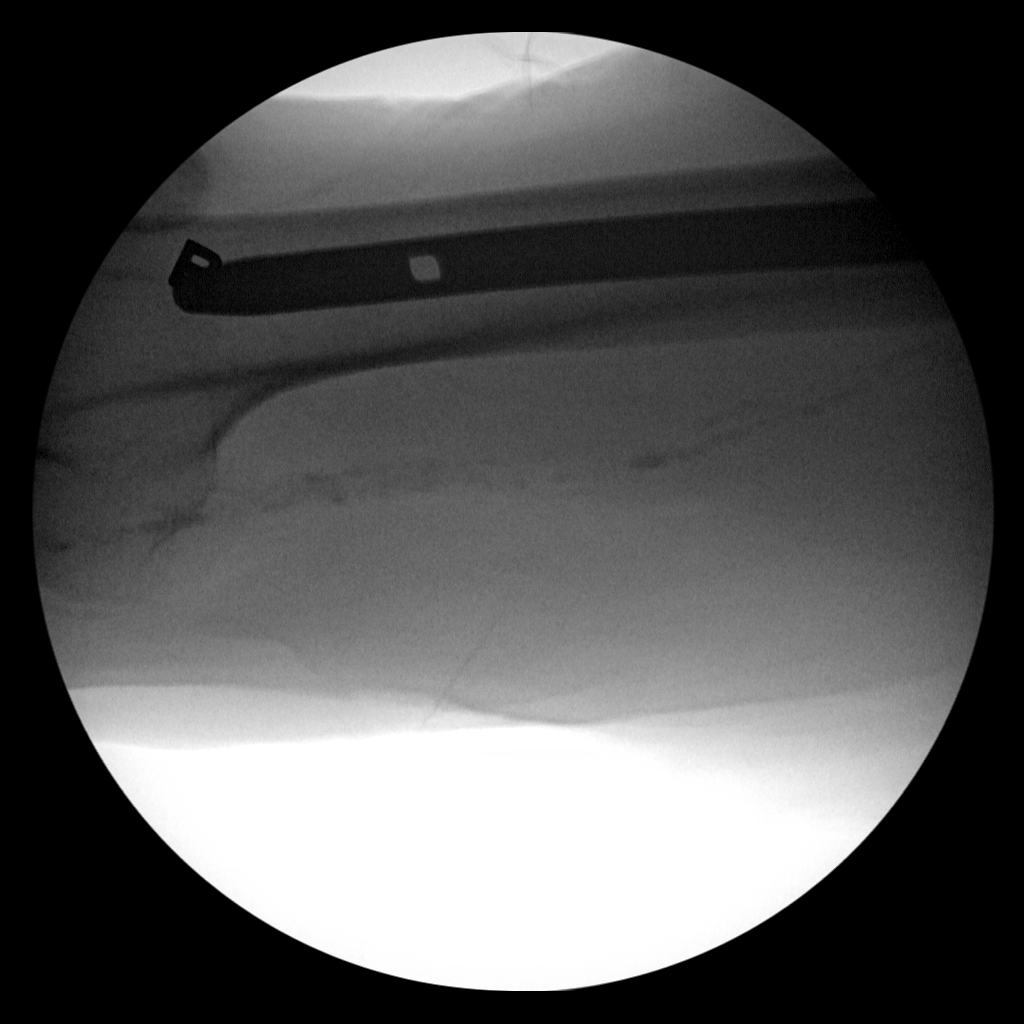
[im 4/4]
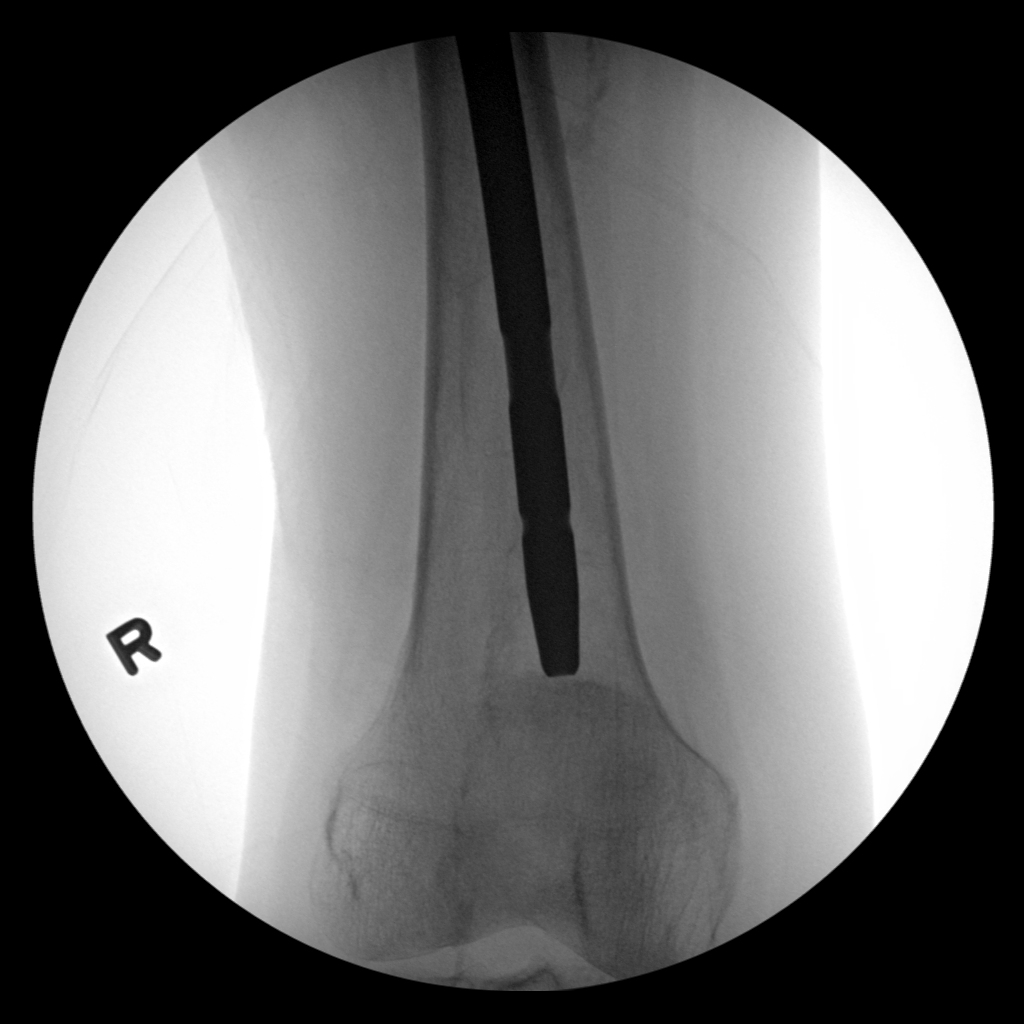

[4 of 4 positions shown; findings below may reference images not displayed]

FINDINGS: Placement of an intermedullary rod with proximal screw
fixation across the previously described comminuted
intertrochanteric femur fracture. No acute hardware complication.
IMPRESSION: Intraoperative fixation of proximal right femur.

## 2012-04-12 SURGERY — FIXATION, FRACTURE, INTERTROCHANTERIC, WITH INTRAMEDULLARY ROD
Anesthesia: General | Site: Hip | Laterality: Right | Wound class: Clean

## 2012-04-12 MED ORDER — INSULIN ASPART 100 UNIT/ML ~~LOC~~ SOLN
0.0000 [IU] | Freq: Three times a day (TID) | SUBCUTANEOUS | Status: DC
  Administered 2012-04-13: 2 [IU] via SUBCUTANEOUS
  Administered 2012-04-13 – 2012-04-16 (×5): 1 [IU] via SUBCUTANEOUS

## 2012-04-12 MED ORDER — MENTHOL 3 MG MT LOZG
1.0000 | LOZENGE | OROMUCOSAL | Status: DC | PRN
  Filled 2012-04-12: qty 9

## 2012-04-12 MED ORDER — ACETAMINOPHEN 325 MG PO TABS
650.0000 mg | ORAL_TABLET | Freq: Four times a day (QID) | ORAL | Status: DC | PRN
  Administered 2012-04-13 – 2012-04-15 (×3): 650 mg via ORAL
  Filled 2012-04-12 (×3): qty 2

## 2012-04-12 MED ORDER — LACTATED RINGERS IV SOLN
INTRAVENOUS | Status: DC
  Administered 2012-04-12 – 2012-04-14 (×2): via INTRAVENOUS

## 2012-04-12 MED ORDER — CLINDAMYCIN PHOSPHATE 600 MG/50ML IV SOLN
600.0000 mg | Freq: Four times a day (QID) | INTRAVENOUS | Status: AC
  Administered 2012-04-12 – 2012-04-13 (×2): 600 mg via INTRAVENOUS
  Filled 2012-04-12 (×2): qty 50

## 2012-04-12 MED ORDER — LIDOCAINE HCL (CARDIAC) 20 MG/ML IV SOLN
INTRAVENOUS | Status: DC | PRN
  Administered 2012-04-12: 40 mg via INTRAVENOUS

## 2012-04-12 MED ORDER — INSULIN ASPART 100 UNIT/ML ~~LOC~~ SOLN: 0.0000 [IU] | Freq: Every day | SUBCUTANEOUS | Status: DC

## 2012-04-12 MED ORDER — METOPROLOL TARTRATE 12.5 MG HALF TABLET
12.5000 mg | ORAL_TABLET | Freq: Two times a day (BID) | ORAL | Status: DC
  Administered 2012-04-12 – 2012-04-15 (×6): 12.5 mg via ORAL
  Filled 2012-04-12 (×11): qty 1

## 2012-04-12 MED ORDER — VANCOMYCIN HCL 1000 MG IV SOLR
1000.0000 mg | INTRAVENOUS | Status: DC | PRN
  Administered 2012-04-12: 1000 mg via INTRAVENOUS

## 2012-04-12 MED ORDER — HYDROCODONE-ACETAMINOPHEN 5-325 MG PO TABS
1.0000 | ORAL_TABLET | Freq: Four times a day (QID) | ORAL | Status: DC | PRN
  Filled 2012-04-12: qty 1

## 2012-04-12 MED ORDER — PANTOPRAZOLE SODIUM 40 MG PO TBEC
40.0000 mg | DELAYED_RELEASE_TABLET | Freq: Every day | ORAL | Status: DC
  Administered 2012-04-12 – 2012-04-16 (×5): 40 mg via ORAL
  Filled 2012-04-12 (×5): qty 1

## 2012-04-12 MED ORDER — BACITRACIN ZINC 500 UNIT/GM EX OINT
TOPICAL_OINTMENT | CUTANEOUS | Status: DC | PRN
  Administered 2012-04-12: 1 via TOPICAL

## 2012-04-12 MED ORDER — DOCUSATE SODIUM 100 MG PO CAPS
100.0000 mg | ORAL_CAPSULE | Freq: Two times a day (BID) | ORAL | Status: DC
  Administered 2012-04-12 – 2012-04-16 (×8): 100 mg via ORAL
  Filled 2012-04-12 (×10): qty 1

## 2012-04-12 MED ORDER — ASPIRIN EC 325 MG PO TBEC
325.0000 mg | DELAYED_RELEASE_TABLET | Freq: Every day | ORAL | Status: DC
  Administered 2012-04-13 – 2012-04-15 (×3): 325 mg via ORAL
  Filled 2012-04-12 (×5): qty 1

## 2012-04-12 MED ORDER — PROPOFOL 10 MG/ML IV BOLUS
INTRAVENOUS | Status: DC | PRN
  Administered 2012-04-12: 100 mg via INTRAVENOUS

## 2012-04-12 MED ORDER — ACETAMINOPHEN 650 MG RE SUPP: 650.0000 mg | Freq: Four times a day (QID) | RECTAL | Status: DC | PRN

## 2012-04-12 MED ORDER — ASPIRIN 81 MG PO CHEW
81.0000 mg | CHEWABLE_TABLET | Freq: Every day | ORAL | Status: DC
  Filled 2012-04-12 (×2): qty 1

## 2012-04-12 MED ORDER — SUCCINYLCHOLINE CHLORIDE 20 MG/ML IJ SOLN
INTRAMUSCULAR | Status: DC | PRN
  Administered 2012-04-12: 80 mg via INTRAVENOUS

## 2012-04-12 MED ORDER — METHOCARBAMOL 500 MG PO TABS
500.0000 mg | ORAL_TABLET | Freq: Four times a day (QID) | ORAL | Status: DC | PRN
  Administered 2012-04-14 – 2012-04-16 (×2): 500 mg via ORAL
  Filled 2012-04-12 (×2): qty 1

## 2012-04-12 MED ORDER — ZOLPIDEM TARTRATE 5 MG PO TABS: 5.0000 mg | ORAL_TABLET | Freq: Every evening | ORAL | Status: DC | PRN

## 2012-04-12 MED ORDER — FENTANYL CITRATE 0.05 MG/ML IJ SOLN
INTRAMUSCULAR | Status: DC | PRN
  Administered 2012-04-12 (×2): 50 ug via INTRAVENOUS

## 2012-04-12 MED ORDER — PHENOL 1.4 % MT LIQD: 1.0000 | OROMUCOSAL | Status: DC | PRN

## 2012-04-12 MED ORDER — PHENYLEPHRINE HCL 10 MG/ML IJ SOLN
INTRAMUSCULAR | Status: DC | PRN
  Administered 2012-04-12: 80 ug via INTRAVENOUS

## 2012-04-12 MED ORDER — FENTANYL CITRATE 0.05 MG/ML IJ SOLN
25.0000 ug | INTRAMUSCULAR | Status: DC | PRN
  Administered 2012-04-12 (×2): 25 ug via INTRAVENOUS

## 2012-04-12 MED ORDER — MORPHINE SULFATE 2 MG/ML IJ SOLN
0.5000 mg | INTRAMUSCULAR | Status: DC | PRN
  Administered 2012-04-12: 0.5 mg via INTRAVENOUS

## 2012-04-12 MED ORDER — ONDANSETRON HCL 4 MG PO TABS: 4.0000 mg | ORAL_TABLET | Freq: Four times a day (QID) | ORAL | Status: DC | PRN

## 2012-04-12 MED ORDER — ONDANSETRON HCL 4 MG/2ML IJ SOLN
4.0000 mg | Freq: Four times a day (QID) | INTRAMUSCULAR | Status: DC | PRN
  Administered 2012-04-16: 4 mg via INTRAVENOUS
  Filled 2012-04-12: qty 2

## 2012-04-12 MED ORDER — METOCLOPRAMIDE HCL 10 MG PO TABS: 5.0000 mg | ORAL_TABLET | Freq: Three times a day (TID) | ORAL | Status: DC | PRN

## 2012-04-12 MED ORDER — FERROUS SULFATE 325 (65 FE) MG PO TABS
325.0000 mg | ORAL_TABLET | Freq: Three times a day (TID) | ORAL | Status: DC
  Administered 2012-04-13 – 2012-04-16 (×9): 325 mg via ORAL
  Filled 2012-04-12 (×13): qty 1

## 2012-04-12 MED ORDER — METOCLOPRAMIDE HCL 5 MG/ML IJ SOLN: 5.0000 mg | Freq: Three times a day (TID) | INTRAMUSCULAR | Status: DC | PRN

## 2012-04-12 MED ORDER — METHOCARBAMOL 100 MG/ML IJ SOLN
500.0000 mg | Freq: Four times a day (QID) | INTRAMUSCULAR | Status: DC | PRN
  Filled 2012-04-12: qty 5

## 2012-04-12 SURGICAL SUPPLY — 47 items
BANDAGE GAUZE ELAST BULKY 4 IN (GAUZE/BANDAGES/DRESSINGS) ×2 IMPLANT
BLADE SURG 15 STRL LF DISP TIS (BLADE) ×1 IMPLANT
BLADE SURG 15 STRL SS (BLADE) ×2
BNDG COHESIVE 4X5 TAN NS LF (GAUZE/BANDAGES/DRESSINGS) ×2 IMPLANT
CLOTH BEACON ORANGE TIMEOUT ST (SAFETY) ×2 IMPLANT
COVER SURGICAL LIGHT HANDLE (MISCELLANEOUS) ×2 IMPLANT
DRAPE PROXIMA HALF (DRAPES) IMPLANT
DRAPE STERI IOBAN 125X83 (DRAPES) ×2 IMPLANT
DRSG MEPILEX BORDER 4X4 (GAUZE/BANDAGES/DRESSINGS) ×2 IMPLANT
DRSG MEPILEX BORDER 4X8 (GAUZE/BANDAGES/DRESSINGS) ×2 IMPLANT
DRSG PAD ABDOMINAL 8X10 ST (GAUZE/BANDAGES/DRESSINGS) ×4 IMPLANT
DURAPREP 26ML APPLICATOR (WOUND CARE) ×2 IMPLANT
ELECT REM PT RETURN 9FT ADLT (ELECTROSURGICAL) ×2
ELECTRODE REM PT RTRN 9FT ADLT (ELECTROSURGICAL) ×1 IMPLANT
FACESHIELD LNG OPTICON STERILE (SAFETY) ×2 IMPLANT
GAUZE XEROFORM 5X9 LF (GAUZE/BANDAGES/DRESSINGS) ×2 IMPLANT
GLOVE BIO SURGEON STRL SZ7.5 (GLOVE) ×2 IMPLANT
GLOVE BIOGEL PI IND STRL 8 (GLOVE) ×1 IMPLANT
GLOVE BIOGEL PI INDICATOR 8 (GLOVE) ×1
GLOVE ORTHO TXT STRL SZ7.5 (GLOVE) ×2 IMPLANT
GOWN PREVENTION PLUS LG XLONG (DISPOSABLE) IMPLANT
GOWN PREVENTION PLUS XLARGE (GOWN DISPOSABLE) ×2 IMPLANT
GOWN STRL NON-REIN LRG LVL3 (GOWN DISPOSABLE) ×2 IMPLANT
GUIDE PIN 3.2 LONG (PIN) ×1 IMPLANT
GUIDE PIN 3.2MM (MISCELLANEOUS) ×2
GUIDE PIN ORTH 343X3.2XBRAD (MISCELLANEOUS) IMPLANT
HIP SCREW SET (Screw) ×1 IMPLANT
KIT BASIN OR (CUSTOM PROCEDURE TRAY) ×2 IMPLANT
KIT ROOM TURNOVER OR (KITS) ×2 IMPLANT
MANIFOLD NEPTUNE II (INSTRUMENTS) ×2 IMPLANT
NAIL IMHS 10X36 R (Nail) ×1 IMPLANT
NS IRRIG 1000ML POUR BTL (IV SOLUTION) ×2 IMPLANT
PACK GENERAL/GYN (CUSTOM PROCEDURE TRAY) ×2 IMPLANT
PAD ARMBOARD 7.5X6 YLW CONV (MISCELLANEOUS) ×4 IMPLANT
PAD CAST 4YDX4 CTTN HI CHSV (CAST SUPPLIES) ×2 IMPLANT
PADDING CAST COTTON 4X4 STRL (CAST SUPPLIES) ×4
SCREW COMPRESSION (Screw) ×1 IMPLANT
SCREW LAG 95MM (Screw) ×2 IMPLANT
SCREW LAGSTD 95X21X12.7X9 (Screw) IMPLANT
STAPLER VISISTAT 35W (STAPLE) ×2 IMPLANT
SUT VIC AB 0 CT1 27 (SUTURE) ×4
SUT VIC AB 0 CT1 27XBRD ANBCTR (SUTURE) ×2 IMPLANT
SUT VIC AB 2-0 CT1 27 (SUTURE) ×4
SUT VIC AB 2-0 CT1 TAPERPNT 27 (SUTURE) ×2 IMPLANT
TOWEL OR 17X24 6PK STRL BLUE (TOWEL DISPOSABLE) ×2 IMPLANT
TOWEL OR 17X26 10 PK STRL BLUE (TOWEL DISPOSABLE) ×2 IMPLANT
WATER STERILE IRR 1000ML POUR (IV SOLUTION) ×2 IMPLANT

## 2012-04-12 NOTE — Consult Note (Signed)
[facility]       CONSULTATION NOTE   Reason for Consult: Hip fracture, pre-op clearance  Requesting Physician: Dr. Toniann Fail  Cardiologist: Dr. Herbie Baltimore  HPI: This is a 79 y.o. female with a past medical history significant for CAD and PCI to the RCA and LCX in 2004. Cath in 2005 showed mild ISR of the RCA stent.  NST in 2011 showed EF 50% - which agrees with echo showing EF 50-55%. She also has PAD with prior FEm/FEM bypass in 2004.  She recently had left total hip replacement.  Last seen by Dr. Herbie Baltimore in the office on 03/04/12 and was not having active coronary symptoms.  She is now admitted for right hip pain and has a right hip fracture.  We are asked to consult for pre-operative risk assessment.  PMHx:  Past Medical History  Diagnosis Date  . Angina   . Pneumonia 12/12/10  . Hiatal hernia   . Arthritis   . GERD (gastroesophageal reflux disease)   . COPD (chronic obstructive pulmonary disease)     pt. denies having this  . Iron deficiency anemia   . Barrett esophagus   . HTN (hypertension)   . DJD (degenerative joint disease)   . Osteoporosis   . HLD (hyperlipidemia)   . Internal hemorrhoids   . Diverticulosis   . Shortness of breath     MILD- WITH EXERTION  . Pain     RIGHT SHOULDER WITH LIMITED ROM--STATES PREVIOUS RT TOTAL SHOULDDER REPLACEMENT-BUT STILL HAS PROBLEMS WITH SHOULDER  . Coronary artery disease    Past Surgical History  Procedure Laterality Date  . Back surgery      x 2  . Appendectomy      12yrs old  . Cardiac catheterization      stents  . Total shoulder replacement      right  . Cholecystectomy      several yrs ago  . Cataract extraction  2001, 2002    bilateral  . Total abdominal hysterectomy  1965  . Upper gastrointestinal endoscopy  multiple    w/bx, hiatal hernia, Barretts', esophageal polyp 1x  . Colonoscopy  05/09/2004; 06/08/1999    internal hemorrhoids; diverticulosis  . Rotator cuff repair       bilateral  . Lumbar spine surgery  October 2012    spinal stenosis, Dr. Ophelia Charter  . Abdominal aortic aneurysm repair  2004  . Coronary angioplasty  2004     STENT PLACEMENT RT CORONARY ARTERY AND CIRCUMFLEX ARTERY  . Left carpal tunnel release march 2013    . Total hip arthroplasty  01/26/2012    Procedure: TOTAL HIP ARTHROPLASTY ANTERIOR APPROACH;  Surgeon: Kathryne Hitch, MD;  Location: [facility];  Service: Orthopedics;  Laterality: Left;  Left Total Hip Arthroplasty, Anterior Approach (C-Arm)    FAMHx: Family History  Problem Relation Age of Onset  . Prostate cancer Father   . Diabetes Mother   . Colon cancer Neg Hx   . Breast cancer Mother   . Heart disease Mother     SOCHx:  reports that she quit smoking about 11 years ago. Her smoking use included Cigarettes. She smoked 0.00 packs per day. She has never used smokeless tobacco. She reports that she does not drink alcohol or use illicit drugs.  ALLERGIES: Allergies  Allergen Reactions  . Codeine Nausea And Vomiting, Swelling and Rash  . Hydrocodone Nausea And Vomiting, Swelling and Rash  . Morphine And  Related Other (See Comments)    hallucinations  . Other Nausea And Vomiting    Most pain meds other than tramadol. Tylenol, Toradol, and Robaxin are ok.  PT STATES SHE NEEDS PAPER TAPE--SKIN VERY FRAGILE  . Penicillins Hives  . Darvocet (Propoxyphene-Acetaminophen)   . Demerol (Meperidine)   . Statins     INTOLERANT OF STATINS    ROS: A comprehensive review of systems was negative except for: Musculoskeletal: positive for right hip pain  HOME MEDICATIONS: Prescriptions prior to admission  Medication Sig Dispense Refill  . acetaminophen (TYLENOL) 650 MG CR tablet Take 1,300 mg by mouth. IF NEEDED FOR PAIN      . amLODipine (NORVASC) 5 MG tablet Take 5 mg by mouth daily before breakfast.       . aspirin 81 MG tablet Take 81 mg by mouth daily.      . Calcium Carbonate-Vitamin D (CALTRATE 600+D) 600-400 MG-UNIT per  tablet Take 1 tablet by mouth daily.       . Cholecalciferol (VITAMIN D3) 2000 UNITS TABS Take 1 tablet by mouth daily.      . Flaxseed, Linseed, (FLAX SEED OIL PO) Take 1 tablet by mouth daily. Hold while in hospital      . hydroxychloroquine (PLAQUENIL) 200 MG tablet Take 200 mg by mouth daily.       . Multiple Vitamins-Minerals (CENTRUM SILVER PO) Take 1 tablet by mouth daily.      [name] omeprazole (PRILOSEC OTC) 20 MG tablet Take 20 mg by mouth daily.      . promethazine (PHENERGAN) 12.5 MG tablet Take 12.5 mg by mouth every 6 (six) hours as needed. PT TAKES WITH TRAMADOL      . traMADol (ULTRAM) 50 MG tablet Take 1 tablet (50 mg total) by mouth every 6 (six) hours as needed for pain. Pain  30 tablet  0  . vitamin C (ASCORBIC ACID) 500 MG tablet Take 500 mg by mouth daily.        HOSPITAL MEDICATIONS: Prior to Admission:  Prescriptions prior to admission  Medication Sig Dispense Refill  . acetaminophen (TYLENOL) 650 MG CR tablet Take 1,300 mg by mouth. IF NEEDED FOR PAIN      . amLODipine (NORVASC) 5 MG tablet Take 5 mg by mouth daily before breakfast.       . aspirin 81 MG tablet Take 81 mg by mouth daily.      . Calcium Carbonate-Vitamin D (CALTRATE 600+D) 600-400 MG-UNIT per tablet Take 1 tablet by mouth daily.       . Cholecalciferol (VITAMIN D3) 2000 UNITS TABS Take 1 tablet by mouth daily.      . Flaxseed, Linseed, (FLAX SEED OIL PO) Take 1 tablet by mouth daily. Hold while in hospital      . hydroxychloroquine (PLAQUENIL) 200 MG tablet Take 200 mg by mouth daily.       . Multiple Vitamins-Minerals (CENTRUM SILVER PO) Take 1 tablet by mouth daily.      [name] omeprazole (PRILOSEC OTC) 20 MG tablet Take 20 mg by mouth daily.      . promethazine (PHENERGAN) 12.5 MG tablet Take 12.5 mg by mouth every 6 (six) hours as needed. PT TAKES WITH TRAMADOL      . traMADol (ULTRAM) 50 MG tablet Take 1 tablet (50 mg total) by mouth every 6 (six) hours as needed for pain. Pain  30 tablet  0  . vitamin C  (ASCORBIC ACID) 500 MG tablet Take 500 mg by mouth  daily.        VITALS: Blood pressure 136/89, pulse 80, temperature 97.6 F (36.4 C), temperature source Oral, resp. rate 18, height 5\' 3"  (1.6 m), weight 57.153 kg (126 lb), SpO2 96.00%.  PHYSICAL EXAM: General appearance: alert and no distress Neck: no adenopathy, no carotid bruit, no JVD, supple, symmetrical, trachea midline and thyroid not enlarged, symmetric, no tenderness/mass/nodules Lungs: clear to auscultation bilaterally Heart: regular rate and rhythm, S1, S2 normal, no murmur, click, rub or gallop Abdomen: soft, non-tender; bowel sounds normal; no masses,  no organomegaly Extremities: extremities normal, atraumatic, no cyanosis or edema, the right leg is in traction. Pulses: 2+ and symmetric Skin: Skin color, texture, turgor normal. No rashes or lesions Neurologic: Grossly normal  LABS: Results for orders placed during the hospital encounter of 04/11/12 (from the past 48 hour(s))  CBC WITH DIFFERENTIAL     Status: Abnormal   Collection Time    04/11/12  7:57 PM      Result Value Range   WBC 8.5  4.0 - 10.5 K/uL   RBC 4.22  3.87 - 5.11 MIL/uL   Hemoglobin 12.5  12.0 - 15.0 g/dL   HCT 96.0  45.4 - 09.8 %   MCV 88.6  78.0 - 100.0 fL   MCH 29.6  26.0 - 34.0 pg   MCHC 33.4  30.0 - 36.0 g/dL   RDW 11.9  14.7 - 82.9 %   Platelets 126 (*) 150 - 400 K/uL   Neutrophils Relative 76  43 - 77 %   Neutro Abs 6.5  1.7 - 7.7 K/uL   Lymphocytes Relative 14  12 - 46 %   Lymphs Abs 1.2  0.7 - 4.0 K/uL   Monocytes Relative 8  3 - 12 %   Monocytes Absolute 0.6  0.1 - 1.0 K/uL   Eosinophils Relative 2  0 - 5 %   Eosinophils Absolute 0.2  0.0 - 0.7 K/uL   Basophils Relative 0  0 - 1 %   Basophils Absolute 0.0  0.0 - 0.1 K/uL  BASIC METABOLIC PANEL     Status: Abnormal   Collection Time    04/11/12  7:57 PM      Result Value Range   Sodium 136  135 - 145 mEq/L   Potassium 3.6  3.5 - 5.1 mEq/L   Chloride 100  96 - 112 mEq/L   CO2  24  19 - 32 mEq/L   Glucose, Bld 285 (*) 70 - 99 mg/dL   BUN 22  6 - 23 mg/dL   Creatinine, Ser 5.62  0.50 - 1.10 mg/dL   Calcium 8.8  8.4 - 13.0 mg/dL   GFR calc non Af Amer 80 (*) >90 mL/min   GFR calc Af Amer >90  >90 mL/min   Comment:            The eGFR has been calculated     using the CKD EPI equation.     This calculation has not been     validated in all clinical     situations.     eGFR's persistently     <90 mL/min signify     possible Chronic Kidney Disease.  PROTIME-INR     Status: None   Collection Time    04/12/12 12:06 AM      Result Value Range   Prothrombin Time 13.9  11.6 - 15.2 seconds   INR 1.08  0.00 - 1.49  APTT     Status:  None   Collection Time    04/12/12 12:06 AM      Result Value Range   aPTT 28  24 - 37 seconds  CALCIUM     Status: None   Collection Time    04/12/12 12:06 AM      Result Value Range   Calcium 8.8  8.4 - 10.5 mg/dL  ALBUMIN     Status: Abnormal   Collection Time    04/12/12 12:06 AM      Result Value Range   Albumin 3.4 (*) 3.5 - 5.2 g/dL  TYPE AND SCREEN     Status: None   Collection Time    04/12/12 12:17 AM      Result Value Range   ABO/RH(D) A POS     Antibody Screen NEG     Sample Expiration 04/15/2012    ABO/RH     Status: None   Collection Time    04/12/12 12:17 AM      Result Value Range   ABO/RH(D) A POS    CBC     Status: Abnormal   Collection Time    04/12/12  5:05 AM      Result Value Range   WBC 12.1 (*) 4.0 - 10.5 K/uL   RBC 3.76 (*) 3.87 - 5.11 MIL/uL   Hemoglobin 11.0 (*) 12.0 - 15.0 g/dL   HCT 16.1 (*) 09.6 - 04.5 %   MCV 87.8  78.0 - 100.0 fL   MCH 29.3  26.0 - 34.0 pg   MCHC 33.3  30.0 - 36.0 g/dL   RDW 40.9  81.1 - 91.4 %   Platelets 150  150 - 400 K/uL  BASIC METABOLIC PANEL     Status: Abnormal   Collection Time    04/12/12  5:05 AM      Result Value Range   Sodium 137  135 - 145 mEq/L   Potassium 5.0  3.5 - 5.1 mEq/L   Comment: DELTA CHECK NOTED     NO VISIBLE HEMOLYSIS   Chloride  104  96 - 112 mEq/L   CO2 26  19 - 32 mEq/L   Glucose, Bld 179 (*) 70 - 99 mg/dL   BUN 20  6 - 23 mg/dL   Creatinine, Ser 7.82  0.50 - 1.10 mg/dL   Calcium 8.6  8.4 - 95.6 mg/dL   GFR calc non Af Amer 85 (*) >90 mL/min   GFR calc Af Amer >90  >90 mL/min   Comment:            The eGFR has been calculated     using the CKD EPI equation.     This calculation has not been     validated in all clinical     situations.     eGFR's persistently     <90 mL/min signify     possible Chronic Kidney Disease.    IMAGING: Dg Hip Complete Right  04/11/2012  *RADIOLOGY REPORT*  Clinical Data: Right hip pain status post fall  RIGHT HIP - COMPLETE 2+ VIEW  Comparison: 07/15/2010 MRI  Findings: Comminuted fracture of the right femur with displacement of the lesser trochanter and subtrochanteric extension. The femoral head remains seated within the acetabulum though in a forced abducted position.  The distal dominant fragment is displaced proximally.  Moderate right hip DJD.  Diffuse osteopenia.  Left hip arthroplasty.  Advanced atherosclerotic disease.  Iliac stent.  IMPRESSION: Right inter/subtrochanteric femur fracture.  Per CMS PQRS reporting requirements (  PQRS Measure 24): Given the patient's age of greater than 50 and the fracture site (hip, distal radius, or spine), the patient should be tested for osteoporosis using DXA, and the appropriate treatment considered based on the DXA results.   Original Report Authenticated By: Jearld Lesch, M.D.    Ct Head Wo Contrast  04/11/2012  *RADIOLOGY REPORT*  Clinical Data:  Larey Seat, head pain, neck pain.  CT HEAD WITHOUT CONTRAST CT CERVICAL SPINE WITHOUT CONTRAST  Technique:  Multidetector CT imaging of the head and cervical spine was performed following the standard protocol without intravenous contrast.  Multiplanar CT image reconstructions of the cervical spine were also generated.  Comparison:   None  CT HEAD  Findings: There is no evidence for acute infarction,  intracranial hemorrhage, mass lesion, hydrocephalus, or extra-axial fluid.  Mild age related atrophy.  Mild chronic microvascular ischemic change. No skull fracture.  No subdural hematoma.  Large right parietal scalp hematoma.  Vascular calcification.  No acute sinus or mastoid fluid.  Negative orbits.  IMPRESSION: Chronic changes as described.  Large right parietal scalp hematoma. No skull fracture or intracranial hemorrhage.  CT CERVICAL SPINE  Findings: There is no visible cervical spine fracture or traumatic subluxation.  There is advanced disc space narrowing at C6-C7. Mild pannus.  Calcified central protrusion C3-C4 and C4-5.  Airway midline.  COPD. Advanced vascular calcification.  Mild scoliosis convex left.  Advanced facet arthropathy.  IMPRESSION: No visible cervical spine fracture or traumatic subluxation.   Original Report Authenticated By: Davonna Belling, M.D.    Ct Cervical Spine Wo Contrast  04/11/2012  *RADIOLOGY REPORT*  Clinical Data:  Larey Seat, head pain, neck pain.  CT HEAD WITHOUT CONTRAST CT CERVICAL SPINE WITHOUT CONTRAST  Technique:  Multidetector CT imaging of the head and cervical spine was performed following the standard protocol without intravenous contrast.  Multiplanar CT image reconstructions of the cervical spine were also generated.  Comparison:   None  CT HEAD  Findings: There is no evidence for acute infarction, intracranial hemorrhage, mass lesion, hydrocephalus, or extra-axial fluid.  Mild age related atrophy.  Mild chronic microvascular ischemic change. No skull fracture.  No subdural hematoma.  Large right parietal scalp hematoma.  Vascular calcification.  No acute sinus or mastoid fluid.  Negative orbits.  IMPRESSION: Chronic changes as described.  Large right parietal scalp hematoma. No skull fracture or intracranial hemorrhage.  CT CERVICAL SPINE  Findings: There is no visible cervical spine fracture or traumatic subluxation.  There is advanced disc space narrowing at C6-C7.  Mild pannus.  Calcified central protrusion C3-C4 and C4-5.  Airway midline.  COPD. Advanced vascular calcification.  Mild scoliosis convex left.  Advanced facet arthropathy.  IMPRESSION: No visible cervical spine fracture or traumatic subluxation.   Original Report Authenticated By: Davonna Belling, M.D.     IMPRESSION: 1. Right intra/subtrochanteric hip fracture 2. Low-intermediate risk for an intermediate risk procedure, which is not elective  RECOMMENDATION: 1. She did quite well with her recent hip surgery and has had no anginal symptoms. In fact, she was raking her yard without difficulty before going to [place] where she was off-balance and fell. Discussed with Dr,. Magnus Ivan, plan for surgery today. Was off of bp meds due to hypotension after her last surgery. These have recently been restarted. I would recommend discontinuing norvasc and starting low dose metoprolol 12.5 mg BID with first dose today pre-operatively.    Thanks for the consult. We will follow along in the peri-operative period.  Time Spent  Directly with Patient: 30 minutes  Chrystie Nose, MD, [facility] Attending Cardiologist [facility]  Yeiden Frenkel C 04/12/2012, 7:02 AM

## 2012-04-12 NOTE — Transfer of Care (Signed)
Immediate Anesthesia Transfer of Care Note  Patient: [name]  Procedure(s) Performed: Procedure(s): INTRAMEDULLARY (IM) NAIL INTERTROCHANTRIC Right (Right)  Patient Location: PACU  Anesthesia Type:General  Level of Consciousness: awake, oriented, sedated, patient cooperative and responds to stimulation  Airway & Oxygen Therapy: Patient Spontanous Breathing and Patient connected to nasal cannula oxygen  Post-op Assessment: Report given to PACU RN, Post -op Vital signs reviewed and stable and Patient moving all extremities X 4  Post vital signs: Reviewed and stable  Complications: No apparent anesthesia complications

## 2012-04-12 NOTE — Progress Notes (Signed)
Subjective: A little more comfortable in the buck's traction.  Awake and alert.  Understands we will perform surgery late this afternoon, early evening.   Objective: Vital signs in last 24 hours: Temp:  [97.6 F (36.4 C)-98.3 F (36.8 C)] 97.6 F (36.4 C) (02/21 0631) Pulse Rate:  [80-84] 80 (02/21 0631) Resp:  [14-27] 18 (02/21 0631) BP: (123-140)/(62-89) 136/89 mmHg (02/21 0631) SpO2:  [96 %-100 %] 96 % (02/21 0631) Weight:  [57.153 kg (126 lb)] 57.153 kg (126 lb) (02/21 0034)  Intake/Output from previous day:   Intake/Output this shift:     Recent Labs  04/11/12 1957 04/12/12 0505  HGB 12.5 11.0*    Recent Labs  04/11/12 1957 04/12/12 0505  WBC 8.5 12.1*  RBC 4.22 3.76*  HCT 37.4 33.0*  PLT 126* 150    Recent Labs  04/11/12 1957 04/12/12 0006 04/12/12 0505  NA 136  --  137  K 3.6  --  5.0  CL 100  --  104  CO2 24  --  26  BUN 22  --  20  CREATININE 0.68  --  0.57  GLUCOSE 285*  --  179*  CALCIUM 8.8 8.8 8.6    Recent Labs  04/12/12 0006  INR 1.08    Sensation intact distally Intact pulses distally  Assessment/Plan: To the OR late today for fixation of her right hip fracture   Kathryne Hitch 04/12/2012, 6:57 AM

## 2012-04-12 NOTE — Anesthesia Procedure Notes (Signed)
Procedure Name: Intubation Date/Time: 04/12/2012 5:45 PM Performed by: Arlice Colt B Pre-anesthesia Checklist: Patient identified, Emergency Drugs available, Suction available, Patient being monitored and Timeout performed Patient Re-evaluated:Patient Re-evaluated prior to inductionOxygen Delivery Method: Circle system utilized Preoxygenation: Pre-oxygenation with 100% oxygen Intubation Type: IV induction, Rapid sequence and Cricoid Pressure applied Laryngoscope Size: Mac and 3 Grade View: Grade I Tube type: Oral Tube size: 7.5 mm Number of attempts: 1 Airway Equipment and Method: Stylet Placement Confirmation: ETT inserted through vocal cords under direct vision,  positive ETCO2 and breath sounds checked- equal and bilateral Secured at: 21 cm Tube secured with: Tape Dental Injury: Teeth and Oropharynx as per pre-operative assessment

## 2012-04-12 NOTE — ED Provider Notes (Signed)
Medical screening examination/treatment/procedure(s) were conducted as a shared visit with non-physician practitioner(s) and myself.  I personally evaluated the patient during the encounter  Brynnlie Unterreiner, MD 04/12/12 0005 

## 2012-04-12 NOTE — Progress Notes (Signed)
[facility] PROGRESS NOTE  [name]:[id number] DOB: 09-18-1930 DOA: 04/11/2012 PCP: Hoyle Sauer, MD  Assessment/Plan: Right hip fracture Further management as per Dr. Magnus Ivan, orthopedic service.  Appreciate his input. Continue with pain relief medications.  Plan for surgery today.  CAD status post stenting  Denies any chest pain. EKG unremarkable.  Evaluated by cardiology, Dr. Rennis Golden, started on low-dose metoprolol 12.5 mg twice daily.  Continue aspirin 81 mg daily.  Hypertension Stable.  Continue metoprolol.  Rheumatoid arthritis Continue Plaquenil.  COPD  Stable.  Not an active issue at this time.  History of abdominal aortic aneurysm repair Stable.  Scalp hematoma secondary to fall Stable.  Hyperglycemia Check hemoglobin A1c.  Sliding scale insulin.  Code Status: Full code. Family Communication: No family at bedside.  Patient updated. Disposition Plan: Pending after surgery and PT/OT.  Likely will need SNF.   Consultants:  Ortho, Dr. Magnus Ivan.  Cardiology, Dr. Rennis Golden  Procedures:  As above.  Antibiotics:  None  HPI/Subjective: Complaining of hip pain.  Objective: Filed Vitals:   04/12/12 0034 04/12/12 0059 04/12/12 0400 04/12/12 0631  BP:  140/66  136/89  Pulse:  84  80  Temp:  98 F (36.7 C)  97.6 F (36.4 C)  TempSrc:      Resp:  18 18 18   Height: 5\' 3"  (1.6 m)     Weight: 57.153 kg (126 lb)     SpO2:  99% 99% 96%   No intake or output data in the 24 hours ending 04/12/12 0959 Filed Weights   04/12/12 0034  Weight: 57.153 kg (126 lb)    Exam: Physical Exam: General: Awake, Oriented, No acute distress. HEENT: EOMI. Neck: Supple CV: S1 and S2 Lungs: Clear to ascultation bilaterally Abdomen: Soft, Nontender, Nondistended, +bowel sounds. Ext: Good pulses. Trace edema.  Leg in traction.  Data Reviewed: Basic Metabolic Panel:  Recent Labs Lab 04/11/12 1957 04/12/12 0006 04/12/12 0505  NA 136  --  137  K 3.6   --  5.0  CL 100  --  104  CO2 24  --  26  GLUCOSE 285*  --  179*  BUN 22  --  20  CREATININE 0.68  --  0.57  CALCIUM 8.8 8.8 8.6   Liver Function Tests:  Recent Labs Lab 04/12/12 0006  ALBUMIN 3.4*   No results found for this basename: LIPASE, AMYLASE,  in the last 168 hours No results found for this basename: AMMONIA,  in the last 168 hours CBC:  Recent Labs Lab 04/11/12 1957 04/12/12 0505  WBC 8.5 12.1*  NEUTROABS 6.5  --   HGB 12.5 11.0*  HCT 37.4 33.0*  MCV 88.6 87.8  PLT 126* 150   Cardiac Enzymes: No results found for this basename: CKTOTAL, CKMB, CKMBINDEX, TROPONINI,  in the last 168 hours BNP (last 3 results) No results found for this basename: PROBNP,  in the last 8760 hours CBG: No results found for this basename: GLUCAP,  in the last 168 hours  No results found for this or any previous visit (from the past 240 hour(s)).   Studies: Dg Hip Complete Right  04/11/2012  *RADIOLOGY REPORT*  Clinical Data: Right hip pain status post fall  RIGHT HIP - COMPLETE 2+ VIEW  Comparison: 07/15/2010 MRI  Findings: Comminuted fracture of the right femur with displacement of the lesser trochanter and subtrochanteric extension. The femoral head remains seated within the acetabulum though in a forced abducted position.  The distal dominant fragment is displaced  proximally.  Moderate right hip DJD.  Diffuse osteopenia.  Left hip arthroplasty.  Advanced atherosclerotic disease.  Iliac stent.  IMPRESSION: Right inter/subtrochanteric femur fracture.  Per CMS PQRS reporting requirements (PQRS Measure 24): Given the patient's age of greater than 50 and the fracture site (hip, distal radius, or spine), the patient should be tested for osteoporosis using DXA, and the appropriate treatment considered based on the DXA results.   Original Report Authenticated By: Jearld Lesch, M.D.    Ct Head Wo Contrast  04/11/2012  *RADIOLOGY REPORT*  Clinical Data:  Larey Seat, head pain, neck pain.  CT  HEAD WITHOUT CONTRAST CT CERVICAL SPINE WITHOUT CONTRAST  Technique:  Multidetector CT imaging of the head and cervical spine was performed following the standard protocol without intravenous contrast.  Multiplanar CT image reconstructions of the cervical spine were also generated.  Comparison:   None  CT HEAD  Findings: There is no evidence for acute infarction, intracranial hemorrhage, mass lesion, hydrocephalus, or extra-axial fluid.  Mild age related atrophy.  Mild chronic microvascular ischemic change. No skull fracture.  No subdural hematoma.  Large right parietal scalp hematoma.  Vascular calcification.  No acute sinus or mastoid fluid.  Negative orbits.  IMPRESSION: Chronic changes as described.  Large right parietal scalp hematoma. No skull fracture or intracranial hemorrhage.  CT CERVICAL SPINE  Findings: There is no visible cervical spine fracture or traumatic subluxation.  There is advanced disc space narrowing at C6-C7. Mild pannus.  Calcified central protrusion C3-C4 and C4-5.  Airway midline.  COPD. Advanced vascular calcification.  Mild scoliosis convex left.  Advanced facet arthropathy.  IMPRESSION: No visible cervical spine fracture or traumatic subluxation.   Original Report Authenticated By: Davonna Belling, M.D.    Ct Cervical Spine Wo Contrast  04/11/2012  *RADIOLOGY REPORT*  Clinical Data:  Larey Seat, head pain, neck pain.  CT HEAD WITHOUT CONTRAST CT CERVICAL SPINE WITHOUT CONTRAST  Technique:  Multidetector CT imaging of the head and cervical spine was performed following the standard protocol without intravenous contrast.  Multiplanar CT image reconstructions of the cervical spine were also generated.  Comparison:   None  CT HEAD  Findings: There is no evidence for acute infarction, intracranial hemorrhage, mass lesion, hydrocephalus, or extra-axial fluid.  Mild age related atrophy.  Mild chronic microvascular ischemic change. No skull fracture.  No subdural hematoma.  Large right parietal scalp  hematoma.  Vascular calcification.  No acute sinus or mastoid fluid.  Negative orbits.  IMPRESSION: Chronic changes as described.  Large right parietal scalp hematoma. No skull fracture or intracranial hemorrhage.  CT CERVICAL SPINE  Findings: There is no visible cervical spine fracture or traumatic subluxation.  There is advanced disc space narrowing at C6-C7. Mild pannus.  Calcified central protrusion C3-C4 and C4-5.  Airway midline.  COPD. Advanced vascular calcification.  Mild scoliosis convex left.  Advanced facet arthropathy.  IMPRESSION: No visible cervical spine fracture or traumatic subluxation.   Original Report Authenticated By: Davonna Belling, M.D.    Dg Chest Portable 1 View  04/12/2012  *RADIOLOGY REPORT*  Clinical Data: Rule out infiltrate  PORTABLE CHEST - 1 VIEW  Comparison: 01/24/2012  Findings: Lungs remain clear without evidence of heart failure or pneumonia.  No pleural effusion.  Right shoulder replacement noted.  IMPRESSION: No acute abnormality and no interval change.   Original Report Authenticated By: Janeece Riggers, M.D.     Scheduled Meds: . sodium chloride   Intravenous STAT  . aspirin  81 mg Oral Daily  .  hydroxychloroquine  200 mg Oral Daily  . metoprolol tartrate  12.5 mg Oral BID  . pantoprazole  40 mg Oral Daily   Continuous Infusions: . sodium chloride 75 mL/hr (04/12/12 0447)    Principal Problem:   Closed right hip fracture Active Problems:   CAD (coronary artery disease)   COPD (chronic obstructive pulmonary disease)   HTN (hypertension)   AAA (abdominal aortic aneurysm)   Rheumatoid arthritis    Otillia Cordone A, MD  [facility] Pager [phone]. If 7PM-7AM, please contact night-coverage at [email or web address], password [facility] 04/12/2012, 9:59 AM  LOS: 1 day

## 2012-04-12 NOTE — Progress Notes (Signed)
Orthopedic Tech Progress Note Patient Details:  [name] Oct 18, 1930 [id number] OHF with trapeze bar applied to bed Patient ID: [name], female   DOB: April 19, 1930, 79 y.o.   MRN: [id number]   [name] 04/12/2012, 1:05 PM

## 2012-04-12 NOTE — Progress Notes (Signed)
INITIAL NUTRITION ASSESSMENT  DOCUMENTATION CODES Per approved criteria  -Not Applicable   INTERVENTION: Add Ensure Complete po daily, once diet advanced. Each supplement provides 350 kcal and 13 grams of protein  NUTRITION DIAGNOSIS: Unintentional weight loss related to recent surgery as evidenced by 3% weight loss x 2 months.   Goal: Pt to meet >/= 90% of their estimated nutrition needs.   Monitor:  Diet advancement, PO intake, weight  Reason for Assessment: Hip Fracture Protocol Consult  79 y.o. female  Admitting Dx: Closed right hip fracture  ASSESSMENT: Very active pt admitted after fall at home. Pt fractured her hip and will have surgical repair this afternoon.  Pt on the phone, spoke with family at the bedside. They report that pt had hip replacement in 12/13 then went to rehab and then was d/c'ed home. Pt lost about 4 lb during that time. Per family pt usually has a great appetite which had returned.  Pt does drink ensure at home but not daily.   Height: Ht Readings from Last 1 Encounters:  04/12/12 5\' 3"  (1.6 m)    Weight: Wt Readings from Last 1 Encounters:  04/12/12 126 lb (57.153 kg)    Ideal Body Weight: 52.2 kg  % Ideal Body Weight: 109%  Wt Readings from Last 10 Encounters:  04/12/12 126 lb (57.153 kg)  04/12/12 126 lb (57.153 kg)  01/26/12 124 lb (56.246 kg)  01/26/12 124 lb (56.246 kg)  01/24/12 124 lb (56.246 kg)  04/20/11 128 lb (58.06 kg)  03/30/11 131 lb (59.421 kg)  03/23/11 131 lb 6.4 oz (59.603 kg)  01/20/11 133 lb (60.328 kg)  07/26/09 139 lb (63.05 kg)    Usual Body Weight: 130 lb 12/13  % Usual Body Weight: 97%  BMI:  Body mass index is 22.33 kg/(m^2).  Estimated Nutritional Needs: Kcal: 1450-1600 Protein: 70-80 grams Fluid: > 1.5 L/day  Skin: no issues noted  Diet Order: NPO  EDUCATION NEEDS: -No education needs identified at this time  No intake or output data in the 24 hours ending 04/12/12 1352  Last BM: 2/19    Labs:   Recent Labs Lab 04/11/12 1957 04/12/12 0006 04/12/12 0505  NA 136  --  137  K 3.6  --  5.0  CL 100  --  104  CO2 24  --  26  BUN 22  --  20  CREATININE 0.68  --  0.57  CALCIUM 8.8 8.8 8.6  GLUCOSE 285*  --  179*    CBG (last 3)  No results found for this basename: GLUCAP,  in the last 72 hours  Scheduled Meds: . aspirin  81 mg Oral Daily  . hydroxychloroquine  200 mg Oral Daily  . metoprolol tartrate  12.5 mg Oral BID  . pantoprazole  40 mg Oral Daily    Continuous Infusions: . sodium chloride 75 mL/hr (04/12/12 0447)    Past Medical History  Diagnosis Date  . Angina   . Pneumonia 12/12/10  . Hiatal hernia   . Arthritis   . GERD (gastroesophageal reflux disease)   . COPD (chronic obstructive pulmonary disease)     pt. denies having this  . Iron deficiency anemia   . Barrett esophagus   . HTN (hypertension)   . DJD (degenerative joint disease)   . Osteoporosis   . HLD (hyperlipidemia)   . Internal hemorrhoids   . Diverticulosis   . Shortness of breath     MILD- WITH EXERTION  . Pain  RIGHT SHOULDER WITH LIMITED ROM--STATES PREVIOUS RT TOTAL SHOULDDER REPLACEMENT-BUT STILL HAS PROBLEMS WITH SHOULDER  . Coronary artery disease     Past Surgical History  Procedure Laterality Date  . Back surgery      x 2  . Appendectomy      65yrs old  . Cardiac catheterization      stents  . Total shoulder replacement      right  . Cholecystectomy      several yrs ago  . Cataract extraction  2001, 2002    bilateral  . Total abdominal hysterectomy  1965  . Upper gastrointestinal endoscopy  multiple    w/bx, hiatal hernia, Barretts', esophageal polyp 1x  . Colonoscopy  05/09/2004; 06/08/1999    internal hemorrhoids; diverticulosis  . Rotator cuff repair      bilateral  . Lumbar spine surgery  October 2012    spinal stenosis, Dr. Ophelia Charter  . Abdominal aortic aneurysm repair  2004  . Coronary angioplasty  2004     STENT PLACEMENT RT CORONARY ARTERY  AND CIRCUMFLEX ARTERY  . Left carpal tunnel release march 2013    . Total hip arthroplasty  01/26/2012    Procedure: TOTAL HIP ARTHROPLASTY ANTERIOR APPROACH;  Surgeon: Kathryne Hitch, MD;  Location: [facility];  Service: Orthopedics;  Laterality: Left;  Left Total Hip Arthroplasty, Anterior Approach (C-Arm)   Kendell Bane RD, LDN, CNSC [phone] Pager [phone] After Hours Pager

## 2012-04-12 NOTE — Progress Notes (Addendum)
Inpatient Diabetes Program Recommendations  AACE/ADA: New Consensus Statement on Inpatient Glycemic Control (2013)  Target Ranges:  Prepandial:   less than 140 mg/dL      Peak postprandial:   less than 180 mg/dL (1-2 hours)      Critically ill patients:  140 - 180 mg/dL   Results for SHELLA, LAHMAN (MRN [id number]) as of 04/12/2012 09:27  Ref. Range 04/11/2012 19:57 04/12/2012 05:05  Glucose Latest Range: 70-99 mg/dL 782 (H) 956 (H)   Inpatient Diabetes Program Recommendations Correction (SSI): Please consider ordering CBGs with Novolog correction Q4H while NPO and change to ACHS once diet is started. HgbA1C: Please consider ordering an A1C to determine glycemic control over the past 2-3 months.  Note: Patient does not have a documented history of diabetes.  However, initial blood glucose was 285 mg/dl on 04/04/06 @ 65:78 and fasting blood glucose this morning at 5:05 was 179 mg/dl.  Please consider ordering CBGs with Novolog correction Q4H and an A1C to determine glycemic control over the past 2-3 months and determine if criteria for diabetes diagnosis is evident.  Will continue to follow.  Thanks, Orlando Penner, RN, BSN, CCRN Diabetes Coordinator Inpatient Diabetes Program [phone]

## 2012-04-12 NOTE — Anesthesia Postprocedure Evaluation (Signed)
  Anesthesia Post-op Note  Patient: [name]  Procedure(s) Performed: Procedure(s): INTRAMEDULLARY (IM) NAIL INTERTROCHANTRIC Right (Right)  Patient Location: PACU  Anesthesia Type:General  Level of Consciousness: awake, alert  and oriented  Airway and Oxygen Therapy: Patient Spontanous Breathing and Patient connected to nasal cannula oxygen  Post-op Pain: mild  Post-op Assessment: Post-op Vital signs reviewed  Post-op Vital Signs: Reviewed  Complications: No apparent anesthesia complications

## 2012-04-12 NOTE — Progress Notes (Signed)
CARE MANAGEMENT NOTE 04/12/2012  Patient:  [name], [name]   Account Number:  [id number]  Date Initiated:  04/12/2012  Documentation initiated by:  Vance Peper  Subjective/Objective Assessment:   79 yr old female admitted with right hip fracture. Surgery will be later today.     Action/Plan:   Anticipated DC Date:     Anticipated DC Plan:        DC Planning Services  CM consult      Choice offered to / List presented to:             Status of service:  In process, will continue to follow Medicare Important Message given?   (If response is "NO", the following Medicare IM given date fields will be blank) Date Medicare IM given:   Date Additional Medicare IM given:    Discharge Disposition:    Per UR Regulation:    If discussed at Long Length of Stay Meetings, dates discussed:    Comments:

## 2012-04-12 NOTE — Brief Op Note (Signed)
04/11/2012 - 04/12/2012  7:03 PM  PATIENT:  [name]  79 y.o. female  PRE-OPERATIVE DIAGNOSIS:  Right Intertroch Hip Fracture  POST-OPERATIVE DIAGNOSIS:  Right Intertroch Hip Fracture  PROCEDURE:  Procedure(s): INTRAMEDULLARY (IM) NAIL INTERTROCHANTRIC Right (Right)  SURGEON:  Surgeon(s) and Role:    * Kathryne Hitch, MD - Primary  PHYSICIAN ASSISTANT:   ASSISTANTS: none   ANESTHESIA:   general  EBL:   100 cc  BLOOD ADMINISTERED:none  DRAINS: none   LOCAL MEDICATIONS USED:  NONE  SPECIMEN:  No Specimen  DISPOSITION OF SPECIMEN:  N/A  COUNTS:  YES  TOURNIQUET:  * No tourniquets in log *  DICTATION: .Other Dictation: Dictation Number [id number]  PLAN OF CARE: Admit to inpatient   PATIENT DISPOSITION:  PACU - hemodynamically stable.   Delay start of Pharmacological VTE agent (>24hrs) due to surgical blood loss or risk of bleeding: no

## 2012-04-12 NOTE — Anesthesia Preprocedure Evaluation (Signed)
Anesthesia Evaluation  Patient identified by MRN, date of birth, ID band Patient awake    Reviewed: Allergy & Precautions, H&P , NPO status , Patient's Chart, lab work & pertinent test results  Airway Mallampati: II TM Distance: >3 FB Neck ROM: Full    Dental  (+) Dental Advisory Given, Edentulous Upper and Edentulous Lower   Pulmonary shortness of breath and with exertion, resolved, COPD COPD inhaler, former smoker,  breath sounds clear to auscultation  Pulmonary exam normal       Cardiovascular hypertension, Pt. on medications + angina + CAD, + Cardiac Stents and + Peripheral Vascular Disease Rhythm:Regular Rate:Normal     Neuro/Psych negative psych ROS   GI/Hepatic Neg liver ROS, hiatal hernia, GERD-  Medicated,  Endo/Other  negative endocrine ROS  Renal/GU negative Renal ROS     Musculoskeletal negative musculoskeletal ROS (+) Arthritis -,   Abdominal   Peds  Hematology negative hematology ROS (+)   Anesthesia Other Findings   Reproductive/Obstetrics                           Anesthesia Physical Anesthesia Plan  ASA: III  Anesthesia Plan: General   Post-op Pain Management:    Induction: Intravenous  Airway Management Planned: Oral ETT  Additional Equipment:   Intra-op Plan:   Post-operative Plan: Extubation in OR  Informed Consent: I have reviewed the patients History and Physical, chart, labs and discussed the procedure including the risks, benefits and alternatives for the proposed anesthesia with the patient or authorized representative who has indicated his/her understanding and acceptance.     Plan Discussed with: CRNA and Surgeon  Anesthesia Plan Comments:         Anesthesia Quick Evaluation

## 2012-04-12 NOTE — Progress Notes (Signed)
SW will follow and will await discharge disposition.  Sabino Niemann, MSW, [phone]

## 2012-04-13 ENCOUNTER — Encounter (HOSPITAL_COMMUNITY): Payer: Self-pay | Admitting: Cardiology

## 2012-04-13 DIAGNOSIS — E1165 Type 2 diabetes mellitus with hyperglycemia: Secondary | ICD-10-CM | POA: Diagnosis present

## 2012-04-13 DIAGNOSIS — I2510 Atherosclerotic heart disease of native coronary artery without angina pectoris: Secondary | ICD-10-CM | POA: Diagnosis present

## 2012-04-13 DIAGNOSIS — Z9861 Coronary angioplasty status: Secondary | ICD-10-CM | POA: Diagnosis present

## 2012-04-13 LAB — GLUCOSE, CAPILLARY
Glucose-Capillary: 157 mg/dL — ABNORMAL HIGH (ref 70–99)
Glucose-Capillary: 182 mg/dL — ABNORMAL HIGH (ref 70–99)

## 2012-04-13 LAB — BASIC METABOLIC PANEL
Calcium: 7.8 mg/dL — ABNORMAL LOW (ref 8.4–10.5)
GFR calc Af Amer: >90 mL/min (ref >90)
GFR calc non Af Amer: 80 mL/min — ABNORMAL LOW (ref >90)
Potassium: 4.4 mEq/L (ref 3.5–5.1)
Sodium: 137 mEq/L (ref 135–145)

## 2012-04-13 LAB — CBC
MCH: 29.9 pg (ref 26.0–34.0)
MCHC: 33.1 g/dL (ref 30.0–36.0)
Platelets: 92 10*3/uL — ABNORMAL LOW (ref 150–400)
RDW: 14.9 % (ref 11.5–15.5)

## 2012-04-13 MED ORDER — TRAMADOL HCL 50 MG PO TABS
50.0000 mg | ORAL_TABLET | ORAL | Status: DC | PRN
  Administered 2012-04-13 – 2012-04-16 (×8): 50 mg via ORAL
  Filled 2012-04-13 (×8): qty 1

## 2012-04-13 MED ORDER — ASPIRIN EC 325 MG PO TBEC: 325.0000 mg | DELAYED_RELEASE_TABLET | Freq: Every day | ORAL | Status: DC

## 2012-04-13 NOTE — Evaluation (Signed)
Physical Therapy Evaluation Patient Details Name: [name] MRN: [id number] DOB: Aug 07, 1930 Today's Date: 04/13/2012 Time: 4098-1191 PT Time Calculation (min): 23 min  PT Assessment / Plan / Recommendation Clinical Impression  pt presents with R hip fx s/p IM nail.  pt very painful and fearful of mobility 2/2 pain.  pt will need SNF level of care at D/C.      PT Assessment  Patient needs continued PT services    Follow Up Recommendations  SNF    Does the patient have the potential to tolerate intense rehabilitation      Barriers to Discharge None      Equipment Recommendations  None recommended by PT    Recommendations for Other Services     Frequency Min 5X/week    Precautions / Restrictions Precautions Precautions: Fall Restrictions Weight Bearing Restrictions: Yes RLE Weight Bearing: Partial weight bearing RLE Partial Weight Bearing Percentage or Pounds: 25-50   Pertinent Vitals/Pain Does not rate, but indicates very painful with mobility.        Mobility  Bed Mobility Bed Mobility: Supine to Sit;Sitting - Scoot to Edge of Bed Supine to Sit: 1: +2 Total assist;With rails Supine to Sit: Patient Percentage: 30% Sitting - Scoot to Edge of Bed: 2: Max assist Details for Bed Mobility Assistance: cues for step-by-step through bed mobility.   Transfers Transfers: Sit to Stand;Stand to Sit;Stand Pivot Transfers Sit to Stand: 1: +2 Total assist;With upper extremity assist;From bed Sit to Stand: Patient Percentage: 40% Stand to Sit: 1: +2 Total assist;With upper extremity assist;To chair/3-in-1;With armrests Stand to Sit: Patient Percentage: 50% Stand Pivot Transfers: 1: +2 Total assist Stand Pivot Transfers: Patient Percentage: 40% Details for Transfer Assistance: cues for UE use, movement of LEs through SPT.   Ambulation/Gait Ambulation/Gait Assistance: Not tested (comment) Stairs: No Wheelchair Mobility Wheelchair Mobility: No    Exercises     PT  Diagnosis: Difficulty walking;Acute pain  PT Problem List: Decreased strength;Decreased range of motion;Decreased activity tolerance;Decreased balance;Decreased mobility;Decreased knowledge of use of DME;Decreased knowledge of precautions;Pain PT Treatment Interventions: DME instruction;Gait training;Stair training;Functional mobility training;Therapeutic activities;Therapeutic exercise;Balance training;Patient/family education   PT Goals Acute Rehab PT Goals PT Goal Formulation: With patient Time For Goal Achievement: 04/27/12 Potential to Achieve Goals: Good Pt will go Supine/Side to Sit: with supervision PT Goal: Supine/Side to Sit - Progress: Goal set today Pt will go Sit to Supine/Side: with supervision PT Goal: Sit to Supine/Side - Progress: Goal set today Pt will go Sit to Stand: with supervision PT Goal: Sit to Stand - Progress: Goal set today Pt will go Stand to Sit: with supervision PT Goal: Stand to Sit - Progress: Goal set today Pt will Ambulate: >150 feet;with supervision;with rolling walker PT Goal: Ambulate - Progress: Goal set today  Visit Information  Last PT Received On: 04/13/12 Assistance Needed: +2 PT/OT Co-Evaluation/Treatment: Yes    Subjective Data  Subjective: I don't know if I can do this.   Patient Stated Goal: Home   Prior Functioning  Home Living Lives With: Alone Available Help at Discharge: Skilled Nursing Facility Additional Comments: pt notes she went to [facility] after her hip surgery in December and plans to D/C there for rehab of this hip.   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   RLE Sensation: WFL - Light Touch Left Lower Extremity Assessment LLE ROM/Strength/Tone: WFL for tasks assessed LLE Sensation: WFL - Light Touch Trunk Assessment Trunk Assessment: Normal   Balance Balance Balance Assessed: No  End of Session PT - End of Session Equipment Utilized During Treatment: Gait belt Activity Tolerance: Patient limited by pain Patient left: in chair;with call bell/phone within reach Nurse Communication: Mobility status  GP     [name], [place] [phone] 04/13/2012, 12:57 PM

## 2012-04-13 NOTE — Progress Notes (Addendum)
[facility] PROGRESS NOTE  [name]:[id number] DOB: 1931-02-05 DOA: 04/11/2012 PCP: Hoyle Sauer, MD  Assessment/Plan: Right hip fracture Dr. Magnus Ivan, orthopedic service performed intramedullary nail intertochanteric on 04/12/2012.  Up with therapy with only 25-50% weight on right leg. Aspirin and SCDs for prophylaxis.  CAD status post stenting  Denies any chest pain. EKG unremarkable.  Continue low-dose metoprolol 12.5 mg twice daily.  Continue aspirin 81 mg daily.  Hypertension Stable.  Continue metoprolol.  Rheumatoid arthritis Continue Plaquenil.  COPD  Stable.  Not an active issue at this time.  History of abdominal aortic aneurysm repair Stable.  Scalp hematoma secondary to fall Stable.  DM2 uncontrolled with complications Hemoglobin A1c 6.8, diabetic by definition.  Sliding scale insulin.  Anemia Acute blood loss anemia from hip fracture. Continue to monitor.  Code Status: Full code. Family Communication: No family at bedside.  Patient updated. Disposition Plan: Likely will need SNF.   Consultants:  Ortho, Dr. Magnus Ivan.  Cardiology, Dr. Rennis Golden  Procedures:  As above.  Antibiotics:  None  HPI/Subjective: Right hip sore from surgery. Otherwise no other specific concerns.  Objective: Filed Vitals:   04/12/12 2330 04/13/12 0213 04/13/12 0552 04/13/12 0800  BP: 105/53 112/48 107/46   Pulse: 73 73 82   Temp: 99.9 F (37.7 C) 97.7 F (36.5 C) 97.4 F (36.3 C)   TempSrc: Oral     Resp: 16 16 16 16   Height:      Weight:      SpO2: 98% 97% 98% 99%    Intake/Output Summary (Last 24 hours) at 04/13/12 1002 Last data filed at 04/13/12 0534  Gross per 24 hour  Intake   1745 ml  Output    600 ml  Net   1145 ml   Filed Weights   04/12/12 0034  Weight: 57.153 kg (126 lb)    Exam: Physical Exam: General: Awake, Oriented, No acute distress. HEENT: EOMI. Neck: Supple CV: S1 and S2 Lungs: Clear to ascultation  bilaterally Abdomen: Soft, Nontender, Nondistended, +bowel sounds. Ext: Good pulses. Trace edema.  Data Reviewed: Basic Metabolic Panel:  Recent Labs Lab 04/11/12 1957 04/12/12 0006 04/12/12 0505 04/13/12 0555  NA 136  --  137 137  K 3.6  --  5.0 4.4  CL 100  --  104 105  CO2 24  --  26 27  GLUCOSE 285*  --  179* 145*  BUN 22  --  20 15  CREATININE 0.68  --  0.57 0.70  CALCIUM 8.8 8.8 8.6 7.8*   Liver Function Tests:  Recent Labs Lab 04/12/12 0006  ALBUMIN 3.4*   No results found for this basename: LIPASE, AMYLASE,  in the last 168 hours No results found for this basename: AMMONIA,  in the last 168 hours CBC:  Recent Labs Lab 04/11/12 1957 04/12/12 0505 04/13/12 0555  WBC 8.5 12.1* 10.5  NEUTROABS 6.5  --   --   HGB 12.5 11.0* 8.6*  HCT 37.4 33.0* 26.0*  MCV 88.6 87.8 90.3  PLT 126* 150 92*   Cardiac Enzymes: No results found for this basename: CKTOTAL, CKMB, CKMBINDEX, TROPONINI,  in the last 168 hours BNP (last 3 results) No results found for this basename: PROBNP,  in the last 8760 hours CBG:  Recent Labs Lab 04/12/12 1551  GLUCAP 155*    Recent Results (from the past 240 hour(s))  SURGICAL PCR SCREEN     Status: None   Collection Time    04/12/12  3:03 PM  Result Value Range Status   MRSA, PCR NEGATIVE  NEGATIVE Final   Staphylococcus aureus NEGATIVE  NEGATIVE Final   Comment:            The Xpert SA Assay (FDA     approved for NASAL specimens     in patients over 20 years of age),     is one component of     a comprehensive surveillance     program.  Test performance has     been validated by [employer] for patients greater     than or equal to 46 year old.     It is not intended     to diagnose infection nor to     guide or monitor treatment.     Studies: Dg Hip Complete Right  04/11/2012  *RADIOLOGY REPORT*  Clinical Data: Right hip pain status post fall  RIGHT HIP - COMPLETE 2+ VIEW  Comparison: 07/15/2010 MRI   Findings: Comminuted fracture of the right femur with displacement of the lesser trochanter and subtrochanteric extension. The femoral head remains seated within the acetabulum though in a forced abducted position.  The distal dominant fragment is displaced proximally.  Moderate right hip DJD.  Diffuse osteopenia.  Left hip arthroplasty.  Advanced atherosclerotic disease.  Iliac stent.  IMPRESSION: Right inter/subtrochanteric femur fracture.  Per CMS PQRS reporting requirements (PQRS Measure 24): Given the patient's age of greater than 50 and the fracture site (hip, distal radius, or spine), the patient should be tested for osteoporosis using DXA, and the appropriate treatment considered based on the DXA results.   Original Report Authenticated By: Jearld Lesch, M.D.    Dg Hip Operative Right  04/12/2012  *RADIOLOGY REPORT*  Clinical Data: Right hip fracture.  DG OPERATIVE RIGHT HIP  Comparison: 1 day prior  Findings: Placement of an intermedullary rod with proximal screw fixation across the previously described comminuted intertrochanteric femur fracture. No acute hardware complication.  IMPRESSION: Intraoperative fixation of proximal right femur.   Original Report Authenticated By: Jeronimo Greaves, M.D.    Ct Head Wo Contrast  04/11/2012  *RADIOLOGY REPORT*  Clinical Data:  Larey Seat, head pain, neck pain.  CT HEAD WITHOUT CONTRAST CT CERVICAL SPINE WITHOUT CONTRAST  Technique:  Multidetector CT imaging of the head and cervical spine was performed following the standard protocol without intravenous contrast.  Multiplanar CT image reconstructions of the cervical spine were also generated.  Comparison:   None  CT HEAD  Findings: There is no evidence for acute infarction, intracranial hemorrhage, mass lesion, hydrocephalus, or extra-axial fluid.  Mild age related atrophy.  Mild chronic microvascular ischemic change. No skull fracture.  No subdural hematoma.  Large right parietal scalp hematoma.  Vascular  calcification.  No acute sinus or mastoid fluid.  Negative orbits.  IMPRESSION: Chronic changes as described.  Large right parietal scalp hematoma. No skull fracture or intracranial hemorrhage.  CT CERVICAL SPINE  Findings: There is no visible cervical spine fracture or traumatic subluxation.  There is advanced disc space narrowing at C6-C7. Mild pannus.  Calcified central protrusion C3-C4 and C4-5.  Airway midline.  COPD. Advanced vascular calcification.  Mild scoliosis convex left.  Advanced facet arthropathy.  IMPRESSION: No visible cervical spine fracture or traumatic subluxation.   Original Report Authenticated By: Davonna Belling, M.D.    Ct Cervical Spine Wo Contrast  04/11/2012  *RADIOLOGY REPORT*  Clinical Data:  Larey Seat, head pain, neck pain.  CT HEAD WITHOUT CONTRAST CT CERVICAL  SPINE WITHOUT CONTRAST  Technique:  Multidetector CT imaging of the head and cervical spine was performed following the standard protocol without intravenous contrast.  Multiplanar CT image reconstructions of the cervical spine were also generated.  Comparison:   None  CT HEAD  Findings: There is no evidence for acute infarction, intracranial hemorrhage, mass lesion, hydrocephalus, or extra-axial fluid.  Mild age related atrophy.  Mild chronic microvascular ischemic change. No skull fracture.  No subdural hematoma.  Large right parietal scalp hematoma.  Vascular calcification.  No acute sinus or mastoid fluid.  Negative orbits.  IMPRESSION: Chronic changes as described.  Large right parietal scalp hematoma. No skull fracture or intracranial hemorrhage.  CT CERVICAL SPINE  Findings: There is no visible cervical spine fracture or traumatic subluxation.  There is advanced disc space narrowing at C6-C7. Mild pannus.  Calcified central protrusion C3-C4 and C4-5.  Airway midline.  COPD. Advanced vascular calcification.  Mild scoliosis convex left.  Advanced facet arthropathy.  IMPRESSION: No visible cervical spine fracture or traumatic  subluxation.   Original Report Authenticated By: Davonna Belling, M.D.    Dg Chest Portable 1 View  04/12/2012  *RADIOLOGY REPORT*  Clinical Data: Rule out infiltrate  PORTABLE CHEST - 1 VIEW  Comparison: 01/24/2012  Findings: Lungs remain clear without evidence of heart failure or pneumonia.  No pleural effusion.  Right shoulder replacement noted.  IMPRESSION: No acute abnormality and no interval change.   Original Report Authenticated By: Janeece Riggers, M.D.     Scheduled Meds: . aspirin EC  325 mg Oral Q breakfast  . docusate sodium  100 mg Oral BID  . ferrous sulfate  325 mg Oral TID PC  . hydroxychloroquine  200 mg Oral Daily  . insulin aspart  0-5 Units Subcutaneous QHS  . insulin aspart  0-9 Units Subcutaneous TID WC  . metoprolol tartrate  12.5 mg Oral BID  . pantoprazole  40 mg Oral Daily   Continuous Infusions: . lactated ringers 50 mL/hr at 04/12/12 1651    Principal Problem:   Closed right hip fracture Active Problems:   COPD (chronic obstructive pulmonary disease)   HTN (hypertension)   AAA (abdominal aortic aneurysm)   Rheumatoid arthritis   CAD (coronary artery disease), native coronary artery - PCI to RCA & Circumflex 2004    [facility] A, MD  [facility] Pager [phone]. If 7PM-7AM, please contact night-coverage at [email or web address], password [facility] 04/13/2012, 10:02 AM  LOS: 2 days

## 2012-04-13 NOTE — Progress Notes (Signed)
Patient ID: [name], female   DOB: November 13, 1930, 79 y.o.   MRN: [id number] Postoperative day 1 internal fixation hip fracture. Patient is resting comfortably. Physical therapy weightbearing as tolerated. Anticipate discharge to skilled nursing.

## 2012-04-13 NOTE — Progress Notes (Signed)
Orthopedic Tech Progress Note Patient Details:  [name] 04-Mar-1930 [id number] Applied overhead frame to bed.     Jennye Moccasin 04/13/2012, 5:13 PM

## 2012-04-13 NOTE — Progress Notes (Signed)
Subjective:  Feels a bit sore, but denies SOB or CP.    Objective:  Vital Signs in the last 24 hours: Temp:  [97.4 F (36.3 C)-99.9 F (37.7 C)] 97.4 F (36.3 C) (02/22 0552) Pulse Rate:  [73-82] 82 (02/22 0552) Resp:  [16-18] 16 (02/22 0800) BP: (105-149)/(46-72) 107/46 mmHg (02/22 0552) SpO2:  [93 %-100 %] 99 % (02/22 0800)  Intake/Output from previous day: 02/21 0701 - 02/22 0700 In: 1845 [P.O.:220; I.V.:1325] Out: 600 [Urine:400; Blood:200] Intake/Output from this shift:   Physical Exam: General appearance: alert, cooperative, appears stated age and no distress Lungs: rales bibasilar and clears some with cough and Non-labored, no Wheezing Heart: regular rate and rhythm, S1, S2 normal, no murmur, click, rub or gallop Abdomen: soft, non-tender; bowel sounds normal; no masses,  no organomegaly Pulses: 2+ and symmetric; No edema.  Lab Results:  Recent Labs  04/12/12 0505 04/13/12 0555  WBC 12.1* 10.5  HGB 11.0* 8.6*  PLT 150 92*    Recent Labs  04/12/12 0505 04/13/12 0555  NA 137 137  K 5.0 4.4  CL 104 105  CO2 26 27  GLUCOSE 179* 145*  BUN 20 15  CREATININE 0.57 0.70   Hepatic Function Panel  Recent Labs  04/12/12 0006  ALBUMIN 3.4*   Imaging: Imaging results have been reviewed  Cardiac Studies:  Assessment/Plan:  Principal Problem:   Closed right hip fracture - POD #1 s/p ORIF R hip Active Problems:   CAD (coronary artery disease), native coronary artery - PCI to RCA & Circumflex 2004   COPD (chronic obstructive pulmonary disease) - pt denies.   HTN (hypertension)   AAA (abdominal aortic aneurysm)   Rheumatoid arthritis  No active cardiac issues.  BP relatively stable.  I am fine with the use of BB as opposed to CCB post-operatively & on d/c.   She has no SSx of CHF or angina & no arrythmia.   She is intolerant of statins (has tried most) -- we are working on a Rx strategy in the OP setting.    Has mild basal rales that cleared with  cough.  Avoid over-hydration - I do not suspect she will need diuresis. Encouraged IS.   [facility] will sign off for now.  Please contact us if there are issues / concerns.   She can f/u with me as scheduled.      LOS: 2 days    HARDING,DAVID W 04/13/2012, 9:39 AM

## 2012-04-13 NOTE — Op Note (Signed)
NAMEHENCHY, MCCAULEY NO.:  [id number]  MEDICAL RECORD NO.:  [id number]  LOCATION:  [id number]                        FACILITY:  [facility]  PHYSICIAN:  Vanita Panda. Magnus Ivan, M.D.DATE OF BIRTH:  10-14-1930  DATE OF PROCEDURE:  04/12/2012 DATE OF DISCHARGE:                              OPERATIVE REPORT   PREOPERATIVE DIAGNOSIS:  Right complex three-part intertrochanteric hip fracture.  POSTOPERATIVE DIAGNOSIS:  Right complex three-part intertrochanteric hip fracture.  PROCEDURE:  Open reduction internal fixation of right intertrochanteric hip fracture.  IMPLANTS:  [employer] 10 x 36 right intramedullary rod with a 95 mm lag screw and compression screw.  SURGEON:  Doneen Poisson, MD.  ANESTHESIA:  General.  BLOOD LOSS:  Less than 100 mL.  ANTIBIOTICS:  1 g vancomycin.  COMPLICATIONS:  None.  INDICATIONS:  Ms. [name] is an 79 year old female, who sustained a mechanical fall last evening in her driveway.  She was brought to the [facility] Emergency Room and found to have a right intertrochanteric hip fracture.  She was seen by the Medicine Service as well as Orthopedics and then also by Cardiology who gave clearance for surgery. I have talked to her and her family in length about surgery and they understand the reason why we are proceeding with this.  She is in considerable pain as well.  PROCEDURE DESCRIPTION:  After informed consent was obtained, appropriate right hip was marked.  She was brought to the operating room.  General anesthesia was obtained.  She was on a stretcher.  She was then placed supine on the fracture table.  Perineal post was placed.  Right operative hip was placed in in-line skeletal traction and traction boot device and the right hip was placed in a stirrup flexed and abducted out of the field.  We then assessed the right hip under direct fluoroscopy with traction and internal rotation.  We were able to adequately  reduce the fracture.  We then prepped the right hip with DuraPrep and sterile drapes.  Time-out was called.  She was identified as correct patient, correct right hip.  We then made an incision just proximal to the greater trochanter and dissected down the tip of the greater trochanter. A guide pin was then inserted in antegrade format and over the guide pin, we placed initiating reamer.  We then took a measurement and chose a 10 x 36 right intramedullary nail.  We placed this down the femoral shaft in antegrade fashion without needing to ream.  Using the outrigger guide, we then made a separate incision in the thigh on the lateral aspect.  We placed a guide pin from the lateral cortex traversing the fracture up into the femoral head in a near center-center position.  We then took a measurement of this and chose a 95 mm lag screw.  We drilled for this and then placed the lag screw without complications.  Next, we placed a compression component as well.  We then removed all instrumentation.  I put the hip through range of motion.  It was stable. I copiously irrigated the two small wounds in normal saline solution and closed the deep tissue with 0 Vicryl followed by 2-0  Vicryl in subcutaneous tissue and staples on the skin.  Two small Xeroform and well-padded dressing were applied.  She was taken off of the fracture table, awakened, extubated, and taken to recovery room in stable condition.  All final counts correct.  There were no complications noted.     Vanita Panda. Magnus Ivan, M.D.     CYB/MEDQ  D:  04/12/2012  T:  04/13/2012  Job:  [id number]

## 2012-04-13 NOTE — Evaluation (Signed)
Occupational Therapy Evaluation Patient Details Name: [name] MRN: [id number] DOB: 01/13/1931 Today's Date: 04/13/2012 Time: 1191-4782 OT Time Calculation (min): 22 min  OT Assessment / Plan / Recommendation Clinical Impression  Pt admitted with Right hip fx and now s/p IM nail.  Hx of L THA  on 01/26/12.  Pt went to [facility] for rehab after L THA but has been home alone and independent for past several weeks.  Recommending SNF to further progress rehab to prepare pt for safe and independent return home.  Will continue to follow acutely.    OT Assessment  Patient needs continued OT Services    Follow Up Recommendations  SNF    Barriers to Discharge Decreased caregiver support pt lives alone  Equipment Recommendations  3 in 1 bedside comode    Recommendations for Other Services    Frequency  Min 2X/week    Precautions / Restrictions Precautions Precautions: Fall Restrictions Weight Bearing Restrictions: Yes RLE Weight Bearing: Partial weight bearing RLE Partial Weight Bearing Percentage or Pounds: 25-50   Pertinent Vitals/Pain See vitals    ADL  Eating/Feeding: Performed;Set up Where Assessed - Eating/Feeding: Chair Grooming: Performed;Wash/dry hands;Set up Where Assessed - Grooming: Supported sitting Upper Body Bathing: Simulated;Minimal assistance Where Assessed - Upper Body Bathing: Unsupported sitting Lower Body Bathing: Simulated;+1 Total assistance Where Assessed - Lower Body Bathing: Supported sit to stand Upper Body Dressing: Simulated;Minimal assistance Where Assessed - Upper Body Dressing: Unsupported sitting Lower Body Dressing: Performed;+1 Total assistance Where Assessed - Lower Body Dressing: Supported sit to stand Toilet Transfer: Simulated;+2 Total assistance Toilet Transfer: Patient Percentage: 40% Statistician Method: Surveyor, minerals:  (bed to chair) Equipment Used: Gait belt Transfers/Ambulation Related to ADLs:  +2 total assist (pt=40%) for SPT from bed to chair.  Assist for steadying due to right hip pain and weakness. ADL Comments: Pt requiring assist due to R hip pain.     OT Diagnosis: Generalized weakness;Acute pain  OT Problem List: Decreased strength;Decreased activity tolerance;Impaired balance (sitting and/or standing);Decreased safety awareness;Decreased knowledge of use of DME or AE;Decreased knowledge of precautions;Pain OT Treatment Interventions: Self-care/ADL training;DME and/or AE instruction;Therapeutic activities;Patient/family education;Balance training   OT Goals Acute Rehab OT Goals OT Goal Formulation: With patient Time For Goal Achievement: 04/20/12 Potential to Achieve Goals: Good ADL Goals Pt Will Perform Grooming: with supervision;Standing at sink ADL Goal: Grooming - Progress: Goal set today Pt Will Transfer to Toilet: with supervision;Stand pivot transfer;with DME;3-in-1;Maintaining weight bearing status ADL Goal: Toilet Transfer - Progress: Goal set today Pt Will Perform Toileting - Clothing Manipulation: with supervision;Standing ADL Goal: Toileting - Clothing Manipulation - Progress: Goal set today Pt Will Perform Toileting - Hygiene: with supervision;Sit to stand from 3-in-1/toilet ADL Goal: Toileting - Hygiene - Progress: Goal set today Miscellaneous OT Goals Miscellaneous OT Goal #1: Pt will perform bed mobility at supervision level as precursor for EOB ADL retraining. OT Goal: Miscellaneous Goal #1 - Progress: Goal set today  Visit Information  Last OT Received On: 04/13/12 Assistance Needed: +2 PT/OT Co-Evaluation/Treatment: Yes    Subjective Data      Prior Functioning     Home Living Lives With: Alone Available Help at Discharge: Skilled Nursing Facility Additional Comments: pt notes she went to [facility] after her hip surgery in December and plans to D/C there for rehab of this hip.   Prior Function Level of Independence: Independent Able  to Take Stairs?: Yes Driving: Yes Vocation: Retired Musician: No difficulties  Vision/Perception Vision - History Patient Visual Report: No change from baseline   Cognition  Cognition Overall Cognitive Status: Appears within functional limits for tasks assessed/performed Arousal/Alertness: Awake/alert Orientation Level: Appears intact for tasks assessed Behavior During Session: [facility] for tasks performed    Extremity/Trunk Assessment Right Upper Extremity Assessment RUE ROM/Strength/Tone: [facility] for tasks assessed Left Upper Extremity Assessment LUE ROM/Strength/Tone: [facility] for tasks assessed Right Lower Extremity Assessment RLE ROM/Strength/Tone: Deficits;Unable to fully assess RLE ROM/Strength/Tone Deficits: Unable to assess due to pain, however pt able to A with active movements.   RLE Sensation: WFL - Light Touch Left Lower Extremity Assessment LLE ROM/Strength/Tone: WFL for tasks assessed LLE Sensation: WFL - Light Touch Trunk Assessment Trunk Assessment: Normal     Mobility Bed Mobility Bed Mobility: Supine to Sit;Sitting - Scoot to Edge of Bed Supine to Sit: 1: +2 Total assist;With rails Supine to Sit: Patient Percentage: 30% Sitting - Scoot to Edge of Bed: 2: Max assist Details for Bed Mobility Assistance: cues for step-by-step through bed mobility.   Transfers Transfers: Sit to Stand;Stand to Sit Sit to Stand: 1: +2 Total assist;With upper extremity assist;From bed Sit to Stand: Patient Percentage: 40% Stand to Sit: 1: +2 Total assist;With upper extremity assist;To chair/3-in-1;With armrests Stand to Sit: Patient Percentage: 50% Details for Transfer Assistance: Assist for power up from bed and for steadying once in standing due to R LE pain.  Required manual assist to advance LEs during SPT.     Exercise     Balance Balance Balance Assessed: No   End of Session OT - End of Session Equipment Utilized During Treatment: Gait  belt Activity Tolerance: Patient limited by pain Patient left: in chair;with call bell/phone within reach Nurse Communication: Mobility status;Patient requests pain meds  GO    04/13/2012 Cipriano Mile OTR/L Pager [phone] Office [phone]  Cipriano Mile 04/13/2012, 2:56 PM

## 2012-04-13 NOTE — Progress Notes (Signed)
Subjective: 1 Day Post-Op Procedure(s) (LRB): INTRAMEDULLARY (IM) NAIL INTERTROCHANTRIC Right (Right) Patient reports pain as moderate.  Hgb down (acute blood loss anemia) due to fracture and surgery.  Will watch for now.  Objective: Vital signs in last 24 hours: Temp:  [97.4 F (36.3 C)-99.9 F (37.7 C)] 97.4 F (36.3 C) (02/22 0552) Pulse Rate:  [73-82] 82 (02/22 0552) Resp:  [16-18] 16 (02/22 0800) BP: (105-149)/(46-72) 107/46 mmHg (02/22 0552) SpO2:  [93 %-100 %] 99 % (02/22 0800)  Intake/Output from previous day: 02/21 0701 - 02/22 0700 In: 1845 [P.O.:220; I.V.:1325] Out: 600 [Urine:400; Blood:200] Intake/Output this shift:     Recent Labs  04/11/12 1957 04/12/12 0505 04/13/12 0555  HGB 12.5 11.0* 8.6*    Recent Labs  04/12/12 0505 04/13/12 0555  WBC 12.1* 10.5  RBC 3.76* 2.88*  HCT 33.0* 26.0*  PLT 150 92*    Recent Labs  04/12/12 0505 04/13/12 0555  NA 137 137  K 5.0 4.4  CL 104 105  CO2 26 27  BUN 20 15  CREATININE 0.57 0.70  GLUCOSE 179* 145*  CALCIUM 8.6 7.8*    Recent Labs  04/12/12 0006  INR 1.08    Sensation intact distally Intact pulses distally Dorsiflexion/Plantar flexion intact Incision: scant drainage  Assessment/Plan: 1 Day Post-Op Procedure(s) (LRB): INTRAMEDULLARY (IM) NAIL INTERTROCHANTRIC Right (Right) Up with therapy, only 25 to 50% weight on right leg. Will need SNF placement. Aspirin and SCD's for DVT prophylax.  Rafiq Bucklin Y 04/13/2012, 10:12 AM

## 2012-04-14 DIAGNOSIS — D649 Anemia, unspecified: Secondary | ICD-10-CM

## 2012-04-14 LAB — CBC
MCV: 88.2 fL (ref 78.0–100.0)
Platelets: 83 10*3/uL — ABNORMAL LOW (ref 150–400)
RBC: 2.45 MIL/uL — ABNORMAL LOW (ref 3.87–5.11)
WBC: 10.2 10*3/uL (ref 4.0–10.5)

## 2012-04-14 LAB — BASIC METABOLIC PANEL
CO2: 26 mEq/L (ref 19–32)
Chloride: 103 mEq/L (ref 96–112)
Potassium: 4.1 mEq/L (ref 3.5–5.1)
Sodium: 135 mEq/L (ref 135–145)

## 2012-04-14 LAB — URINE CULTURE: Colony Count: 10000

## 2012-04-14 LAB — PREPARE RBC (CROSSMATCH)

## 2012-04-14 LAB — GLUCOSE, CAPILLARY: Glucose-Capillary: 83 mg/dL (ref 70–99)

## 2012-04-14 MED ORDER — METFORMIN HCL 500 MG PO TABS
500.0000 mg | ORAL_TABLET | Freq: Two times a day (BID) | ORAL | Status: DC
  Administered 2012-04-15 – 2012-04-16 (×3): 500 mg via ORAL
  Filled 2012-04-14 (×6): qty 1

## 2012-04-14 MED ORDER — POLYVINYL ALCOHOL 1.4 % OP SOLN
1.0000 [drp] | OPHTHALMIC | Status: DC | PRN
  Administered 2012-04-14 – 2012-04-15 (×2): 1 [drp] via OPHTHALMIC
  Filled 2012-04-14 (×2): qty 15

## 2012-04-14 MED ORDER — MAGIC MOUTHWASH
5.0000 mL | Freq: Three times a day (TID) | ORAL | Status: DC | PRN
  Administered 2012-04-14 – 2012-04-16 (×2): 5 mL via ORAL
  Filled 2012-04-14 (×3): qty 5

## 2012-04-14 NOTE — Clinical Social Work Psychosocial (Signed)
Clinical Social Work Department BRIEF PSYCHOSOCIAL ASSESSMENT 04/14/2012  Patient:  [name], [name]     Account Number:  [id number]     Admit date:  04/11/2012  Clinical Social Worker:  Skip Mayer  Date/Time:  04/14/2012 02:00 PM  Referred by:  Physician  Date Referred:  04/14/2012 Referred for  SNF Placement   Other Referral:   Interview type:  Patient Other interview type:    PSYCHOSOCIAL DATA Living Status:  FAMILY Admitted from facility:   Level of care:   Primary support name:  Tammy Primary support relationship to patient:  CHILD, ADULT Degree of support available:   Adequate, per pt    CURRENT CONCERNS Current Concerns  Post-Acute Placement   Other Concerns:    SOCIAL WORK ASSESSMENT / PLAN CSW consulted for assist with SNF placement.  Pt reports previous stay at [facility] and prefers them for this rehab stay, but agreeable to [facility] search as b/u plan.  CSW completed FL2 and initiated SNF search.  Weekday CSW to f/u with offers.   Assessment/plan status:  Information/Referral to [facility] Other assessment/ plan:   Information/referral to community resources:   SNF  PT[facility]    PATIENT'S/FAMILY'S RESPONSE TO PLAN OF CARE: Pt reports agreeable to STR SNF stay in order to increase strength and independence prior to return home.  Pt verbalized understanding of placement process and appreciation for CSW assist.        Dellie Burns, MSW, LCSWA [phone] (Weekends 8:00am-4:30pm)

## 2012-04-14 NOTE — Clinical Social Work Placement (Addendum)
Clinical Social Work Department CLINICAL SOCIAL WORK PLACEMENT NOTE 04/14/2012  Patient:  [name], [name]  Account Number:  [id number] Admit date:  04/11/2012  Clinical Social Worker:  Skip Mayer  Date/time:  04/14/2012 04:00 PM  Clinical Social Work is seeking post-discharge placement for this patient at the following level of care:   SKILLED NURSING   (*CSW will update this form in Epic as items are completed)   04/14/2012  Patient/family provided with [facility] Department of Clinical Social Work's list of facilities offering this level of care within the geographic area requested by the patient (or if unable, by the patient's family).  04/14/2012  Patient/family informed of their freedom to choose among providers that offer the needed level of care, that participate in Medicare, Medicaid or managed care program needed by the patient, have an available bed and are willing to accept the patient.  04/14/2012  Patient/family informed of [facility]' ownership interest in [facility], as well as of the fact that they are under no obligation to receive care at this facility.  PASARR submitted to EDS on Completed PASARR number received from EDS on completed  FL2 transmitted to all facilities in geographic area requested by pt/family on  04/14/2012 FL2 transmitted to all facilities within larger geographic area on   Patient informed that his/her managed care company has contracts with or will negotiate with  certain facilities, including the following:     Patient/family informed of bed offers received:   04/15/12 Patient chooses bed at  Physician recommends and patient chooses bed at    Patient to be transferred to  on  04/16/2012 Patient to be transferred to facility by PTAR  The following physician request were entered in Epic: Sabino Niemann, MSW, [phone] Signing off  Additional Comments:  EXISTING PASARR

## 2012-04-14 NOTE — Progress Notes (Signed)
[facility] PROGRESS NOTE  [name] YNW:[id number] DOB: 1930/07/13 DOA: 04/11/2012 PCP: Hoyle Sauer, MD  Assessment/Plan: Right hip fracture Dr. Magnus Ivan, orthopedic service performed intramedullary nail intertochanteric on 04/12/2012.  Up with therapy with only 25-50% weight on right leg. Aspirin and SCDs for prophylaxis.  CAD status post stenting  Denies any chest pain. EKG unremarkable.  Continue low-dose metoprolol 12.5 mg twice daily.  Continue aspirin 81 mg daily.  Hypertension Stable.  Continue metoprolol.  Rheumatoid arthritis Continue Plaquenil.  COPD  Stable.  Not an active issue at this time.  History of abdominal aortic aneurysm repair Stable.  Scalp hematoma secondary to fall Stable.  DM2 uncontrolled with complications Hemoglobin A1c 6.8, diabetic by definition.  Sliding scale insulin.  Start metformin 500 mg po BID, titrate as outpatient.  Anemia Acute blood loss anemia from hip fracture. Transfuse 1 unit of pRBC.  Thrombocytopenia Likely due to hip fracture.  Dry eyes Artifical tears.  Code Status: Full code. Family Communication: No family at bedside.  Patient updated. Disposition Plan: Likely will need SNF.   Consultants:  Ortho, Dr. Magnus Ivan.  Cardiology, Dr. Rennis Golden  Procedures:  As above.  Antibiotics:  None  HPI/Subjective: Hip pain improving. Complaining of right eye pain and irritation.  Objective: Filed Vitals:   04/13/12 2035 04/13/12 2156 04/14/12 0603 04/14/12 0732  BP: 105/68 102/60 92/49   Pulse: 82 80 77   Temp: 98.5 F (36.9 C)  98.7 F (37.1 C)   TempSrc: Oral     Resp: 18  18 18   Height:      Weight:      SpO2: 100%  100% 99%    Intake/Output Summary (Last 24 hours) at 04/14/12 0941 Last data filed at 04/14/12 0840  Gross per 24 hour  Intake    480 ml  Output      0 ml  Net    480 ml   Filed Weights   04/12/12 0034  Weight: 57.153 kg (126 lb)    Exam: Physical Exam: General:  Awake, Oriented, No acute distress. HEENT: EOMI. No redness in eyes bilaterally. Neck: Supple CV: S1 and S2 Lungs: Clear to ascultation bilaterally Abdomen: Soft, Nontender, Nondistended, +bowel sounds. Ext: Good pulses. Trace edema.  Data Reviewed: Basic Metabolic Panel:  Recent Labs Lab 04/11/12 1957 04/12/12 0006 04/12/12 0505 04/13/12 0555 04/14/12 0615  NA 136  --  137 137 135  K 3.6  --  5.0 4.4 4.1  CL 100  --  104 105 103  CO2 24  --  26 27 26   GLUCOSE 285*  --  179* 145* 94  BUN 22  --  20 15 17   CREATININE 0.68  --  0.57 0.70 0.68  CALCIUM 8.8 8.8 8.6 7.8* 8.3*   Liver Function Tests:  Recent Labs Lab 04/12/12 0006  ALBUMIN 3.4*   No results found for this basename: LIPASE, AMYLASE,  in the last 168 hours No results found for this basename: AMMONIA,  in the last 168 hours CBC:  Recent Labs Lab 04/11/12 1957 04/12/12 0505 04/13/12 0555 04/14/12 0615  WBC 8.5 12.1* 10.5 10.2  NEUTROABS 6.5  --   --   --   HGB 12.5 11.0* 8.6* 7.5*  HCT 37.4 33.0* 26.0* 21.6*  MCV 88.6 87.8 90.3 88.2  PLT 126* 150 92* 83*   Cardiac Enzymes: No results found for this basename: CKTOTAL, CKMB, CKMBINDEX, TROPONINI,  in the last 168 hours BNP (last 3 results) No results found for  this basename: PROBNP,  in the last 8760 hours CBG:  Recent Labs Lab April 17, 2012 1551 04/13/12 1116 04/13/12 1631 04/13/12 2204 04/14/12 0705  GLUCAP 155* 157* 138* 182* 97    Recent Results (from the past 240 hour(s))  URINE CULTURE     Status: None   Collection Time    Apr 17, 2012  7:47 AM      Result Value Range Status   Specimen Description URINE, CATHETERIZED   Final   Special Requests NONE   Final   Culture  Setup Time 17-Apr-2012 09:06   Final   Colony Count 10,000 COLONIES/ML   Final   Culture ESCHERICHIA COLI   Final   Report Status 04/14/2012 FINAL   Final   Organism ID, Bacteria ESCHERICHIA COLI   Final  SURGICAL PCR SCREEN     Status: None   Collection Time    April 17, 2012   3:03 PM      Result Value Range Status   MRSA, PCR NEGATIVE  NEGATIVE Final   Staphylococcus aureus NEGATIVE  NEGATIVE Final   Comment:            The Xpert SA Assay (FDA     approved for NASAL specimens     in patients over 52 years of age),     is one component of     a comprehensive surveillance     program.  Test performance has     been validated by [employer] for patients greater     than or equal to 62 year old.     It is not intended     to diagnose infection nor to     guide or monitor treatment.     Studies: Dg Hip Operative Right  04-17-2012  *RADIOLOGY REPORT*  Clinical Data: Right hip fracture.  DG OPERATIVE RIGHT HIP  Comparison: 1 day prior  Findings: Placement of an intermedullary rod with proximal screw fixation across the previously described comminuted intertrochanteric femur fracture. No acute hardware complication.  IMPRESSION: Intraoperative fixation of proximal right femur.   Original Report Authenticated By: Jeronimo Greaves, M.D.     Scheduled Meds: . aspirin EC  325 mg Oral Q breakfast  . docusate sodium  100 mg Oral BID  . ferrous sulfate  325 mg Oral TID PC  . hydroxychloroquine  200 mg Oral Daily  . insulin aspart  0-5 Units Subcutaneous QHS  . insulin aspart  0-9 Units Subcutaneous TID WC  . metFORMIN  500 mg Oral BID WC  . metoprolol tartrate  12.5 mg Oral BID  . pantoprazole  40 mg Oral Daily   Continuous Infusions: . lactated ringers 50 mL/hr at Apr 17, 2012 1651    Principal Problem:   Closed right hip fracture Active Problems:   COPD (chronic obstructive pulmonary disease)   HTN (hypertension)   AAA (abdominal aortic aneurysm)   Rheumatoid arthritis   CAD (coronary artery disease), native coronary artery - PCI to RCA & Circumflex 2004   DM (diabetes mellitus), type 2, uncontrolled with complications    [name] A, MD  [facility] Pager [phone]. If 7PM-7AM, please contact night-coverage at [email or web address], password  [facility] 04/14/2012, 9:41 AM  LOS: 3 days

## 2012-04-14 NOTE — Progress Notes (Signed)
Patient ID: [name], female   DOB: Apr 12, 1930, 79 y.o.   MRN: [id number] Patient states that her hip feels sore. Encouraged by mouth intake. Anticipate discharge to skilled nursing.

## 2012-04-14 NOTE — Progress Notes (Signed)
Blood transfusion of 1 unit PRBC was actually completed at 2130.

## 2012-04-15 ENCOUNTER — Encounter (HOSPITAL_COMMUNITY): Payer: Self-pay | Admitting: Orthopaedic Surgery

## 2012-04-15 ENCOUNTER — Inpatient Hospital Stay (HOSPITAL_COMMUNITY): Payer: Medicare Other

## 2012-04-15 LAB — TYPE AND SCREEN: Unit division: 0

## 2012-04-15 LAB — CBC
HCT: 26.3 % — ABNORMAL LOW (ref 36.0–46.0)
MCH: 30.1 pg (ref 26.0–34.0)
MCV: 87.1 fL (ref 78.0–100.0)
Platelets: 96 10*3/uL — ABNORMAL LOW (ref 150–400)
RBC: 3.02 MIL/uL — ABNORMAL LOW (ref 3.87–5.11)

## 2012-04-15 LAB — GLUCOSE, CAPILLARY
Glucose-Capillary: 121 mg/dL — ABNORMAL HIGH (ref 70–99)
Glucose-Capillary: 134 mg/dL — ABNORMAL HIGH (ref 70–99)

## 2012-04-15 MED ORDER — GLUCERNA SHAKE PO LIQD: 237.0000 mL | Freq: Three times a day (TID) | ORAL | Status: DC

## 2012-04-15 MED ORDER — METFORMIN HCL 500 MG PO TABS: 500.0000 mg | ORAL_TABLET | Freq: Two times a day (BID) | ORAL | Status: DC

## 2012-04-15 MED ORDER — PHENOL 1.4 % MT LIQD: 1.0000 | OROMUCOSAL | Status: DC | PRN

## 2012-04-15 MED ORDER — CIPROFLOXACIN HCL 250 MG PO TABS
250.0000 mg | ORAL_TABLET | Freq: Two times a day (BID) | ORAL | Status: DC
  Filled 2012-04-15 (×3): qty 1

## 2012-04-15 MED ORDER — METOPROLOL TARTRATE 12.5 MG HALF TABLET: 12.5000 mg | ORAL_TABLET | Freq: Two times a day (BID) | ORAL | Status: DC

## 2012-04-15 MED ORDER — DSS 100 MG PO CAPS: 100.0000 mg | ORAL_CAPSULE | Freq: Two times a day (BID) | ORAL | Status: DC

## 2012-04-15 MED ORDER — FERROUS SULFATE 325 (65 FE) MG PO TABS: 325.0000 mg | ORAL_TABLET | Freq: Three times a day (TID) | ORAL | Status: DC

## 2012-04-15 MED ORDER — HYDROCODONE-ACETAMINOPHEN 5-325 MG PO TABS: 1.0000 | ORAL_TABLET | Freq: Four times a day (QID) | ORAL | Status: DC | PRN

## 2012-04-15 MED ORDER — MAGIC MOUTHWASH: 5.0000 mL | Freq: Three times a day (TID) | ORAL | Status: DC | PRN

## 2012-04-15 MED ORDER — FLUCONAZOLE 100 MG PO TABS
100.0000 mg | ORAL_TABLET | Freq: Every day | ORAL | Status: DC
  Administered 2012-04-15: 100 mg via ORAL
  Filled 2012-04-15 (×2): qty 1

## 2012-04-15 MED ORDER — DEXTROSE 5 % IV SOLN
1.0000 g | Freq: Once | INTRAVENOUS | Status: AC
  Administered 2012-04-15: 1 g via INTRAVENOUS
  Filled 2012-04-15 (×2): qty 10

## 2012-04-15 MED ORDER — CEFUROXIME AXETIL 500 MG PO TABS: 500.0000 mg | ORAL_TABLET | Freq: Two times a day (BID) | ORAL | Status: DC

## 2012-04-15 MED ORDER — MENTHOL 3 MG MT LOZG: 1.0000 | LOZENGE | OROMUCOSAL | Status: DC | PRN

## 2012-04-15 MED ORDER — CEFUROXIME AXETIL 500 MG PO TABS
500.0000 mg | ORAL_TABLET | Freq: Two times a day (BID) | ORAL | Status: DC
  Administered 2012-04-16: 500 mg via ORAL
  Filled 2012-04-15 (×3): qty 1

## 2012-04-15 MED ORDER — ACETAMINOPHEN 325 MG PO TABS: 650.0000 mg | ORAL_TABLET | Freq: Four times a day (QID) | ORAL | Status: DC | PRN

## 2012-04-15 MED ORDER — POLYVINYL ALCOHOL 1.4 % OP SOLN: 1.0000 [drp] | OPHTHALMIC | Status: DC | PRN

## 2012-04-15 NOTE — Progress Notes (Addendum)
[facility] PROGRESS NOTE  [name] IEP:[id number] DOB: [birth year] DOA: 04/11/2012 PCP: Hoyle Sauer, MD  Assessment/Plan: Right hip fracture Dr. Magnus Ivan, orthopedic service performed intramedullary nail intertochanteric on 04/12/2012.  Up with therapy with only 25-50% weight on right leg. Aspirin and SCDs for prophylaxis.  E. Coli UTI Sensitive to cefazolin and ceftriaxone. Start ceftiaxone. Transition to cefuroxamine. Define 5 day course of antibiotics.  CAD status post stenting  Continue low-dose metoprolol 12.5 mg twice daily.  Continue aspirin 81 mg daily.  Hypertension Stable.  Continue metoprolol.  Rheumatoid arthritis Continue Plaquenil.  COPD  Stable.  Not an active issue at this time.  History of abdominal aortic aneurysm repair Stable.  Scalp hematoma secondary to fall Stable.  DM2 uncontrolled with complications Hemoglobin A1c 6.8, diabetic by definition.  Sliding scale insulin.  Start metformin 500 mg po BID, titrate as outpatient.  Anemia Acute blood loss anemia from hip fracture. Transfuse 1 unit of pRBC on 04/14/2012 with appropriate response.  Thrombocytopenia Likely due to hip fracture.  Dry eyes Artifical tears.  Mouth/Throat pain Likely due to intubation from surgery, no oropharyngeal lesions. Continue magic mouth wash as needed.  Code Status: Full code. Family Communication: No family at bedside.  Patient updated. Disposition Plan: SNF when bed is available.   Consultants:  Ortho, Dr. Magnus Ivan.  Cardiology, Dr. Rennis Golden  Procedures:  As above.  Antibiotics:  None  HPI/Subjective: Hip pain improving. Complaining of right eye pain and irritation.  Objective: Filed Vitals:   04/14/12 1930 04/14/12 2030 04/14/12 2139 04/15/12 0524  BP: 110/56 118/58 114/50 110/56  Pulse: 86  76 80  Temp: 99.5 F (37.5 C) 99.5 F (37.5 C) 99.5 F (37.5 C) 99.2 F (37.3 C)  TempSrc:  Oral    Resp: 18  18 18   Height:       Weight:      SpO2:  99% 100% 100%    Intake/Output Summary (Last 24 hours) at 04/15/12 0933 Last data filed at 04/15/12 0500  Gross per 24 hour  Intake    730 ml  Output      0 ml  Net    730 ml   Filed Weights   04/12/12 0034  Weight: 57.153 kg (126 lb)    Exam: Physical Exam: General: Awake, Oriented, No acute distress. HEENT: EOMI. No redness in eyes bilaterally. No erythema or lesions in the oropharynx. Neck: Supple CV: S1 and S2 Lungs: Clear to ascultation bilaterally Abdomen: Soft, Nontender, Nondistended, +bowel sounds. Ext: Good pulses. Trace edema.  Data Reviewed: Basic Metabolic Panel:  Recent Labs Lab 04/11/12 1957 04/12/12 0006 04/12/12 0505 04/13/12 0555 04/14/12 0615  NA 136  --  137 137 135  K 3.6  --  5.0 4.4 4.1  CL 100  --  104 105 103  CO2 24  --  26 27 26   GLUCOSE 285*  --  179* 145* 94  BUN 22  --  20 15 17   CREATININE 0.68  --  0.57 0.70 0.68  CALCIUM 8.8 8.8 8.6 7.8* 8.3*   Liver Function Tests:  Recent Labs Lab 04/12/12 0006  ALBUMIN 3.4*   No results found for this basename: LIPASE, AMYLASE,  in the last 168 hours No results found for this basename: AMMONIA,  in the last 168 hours CBC:  Recent Labs Lab 04/11/12 1957 04/12/12 0505 04/13/12 0555 04/14/12 0615 04/15/12 0700  WBC 8.5 12.1* 10.5 10.2 8.7  NEUTROABS 6.5  --   --   --   --  HGB 12.5 11.0* 8.6* 7.5* 9.1*  HCT 37.4 33.0* 26.0* 21.6* 26.3*  MCV 88.6 87.8 90.3 88.2 87.1  PLT 126* 150 92* 83* 96*   Cardiac Enzymes: No results found for this basename: CKTOTAL, CKMB, CKMBINDEX, TROPONINI,  in the last 168 hours BNP (last 3 results) No results found for this basename: PROBNP,  in the last 8760 hours CBG:  Recent Labs Lab 04/14/12 0705 04/14/12 1118 04/14/12 1617 04/14/12 2142 04/15/12 0711  GLUCAP 97 83 126* 112* 93    Recent Results (from the past 240 hour(s))  URINE CULTURE     Status: None   Collection Time    04/12/12  7:47 AM      Result Value  Range Status   Specimen Description URINE, CATHETERIZED   Final   Special Requests NONE   Final   Culture  Setup Time 04/12/2012 09:06   Final   Colony Count 10,000 COLONIES/ML   Final   Culture ESCHERICHIA COLI   Final   Report Status 04/14/2012 FINAL   Final   Organism ID, Bacteria ESCHERICHIA COLI   Final  SURGICAL PCR SCREEN     Status: None   Collection Time    04/12/12  3:03 PM      Result Value Range Status   MRSA, PCR NEGATIVE  NEGATIVE Final   Staphylococcus aureus NEGATIVE  NEGATIVE Final   Comment:            The Xpert SA Assay (FDA     approved for NASAL specimens     in patients over 26 years of age),     is one component of     a comprehensive surveillance     program.  Test performance has     been validated by [employer] for patients greater     than or equal to 61 year old.     It is not intended     to diagnose infection nor to     guide or monitor treatment.     Studies: No results found.  Scheduled Meds: . aspirin EC  325 mg Oral Q breakfast  . docusate sodium  100 mg Oral BID  . ferrous sulfate  325 mg Oral TID PC  . hydroxychloroquine  200 mg Oral Daily  . insulin aspart  0-5 Units Subcutaneous QHS  . insulin aspart  0-9 Units Subcutaneous TID WC  . metFORMIN  500 mg Oral BID WC  . metoprolol tartrate  12.5 mg Oral BID  . pantoprazole  40 mg Oral Daily   Continuous Infusions: . lactated ringers 50 mL/hr at 04/14/12 2200    Principal Problem:   Closed right hip fracture Active Problems:   COPD (chronic obstructive pulmonary disease)   HTN (hypertension)   AAA (abdominal aortic aneurysm)   Rheumatoid arthritis   CAD (coronary artery disease), native coronary artery - PCI to RCA & Circumflex 2004   DM (diabetes mellitus), type 2, uncontrolled with complications   Anemia    Truman Aceituno A, MD  [facility] Pager [phone]. If 7PM-7AM, please contact night-coverage at [email or web address], password [facility] 04/15/2012, 9:33 AM  LOS: 4 days

## 2012-04-15 NOTE — Progress Notes (Signed)
CARE MANAGEMENT NOTE 04/15/2012  Patient:  [name], [name]   Account Number:  [id number]  Date Initiated:  04/12/2012  Documentation initiated by:  Vance Peper  Subjective/Objective Assessment:   79 yr old female admitted with right hip fracture. Surgery will be later today.    04/15/12- Patient s/p IM Nailing     Action/Plan:   Patient will go to SNF for shortterm rehab. Social worker is aware.   Anticipated DC Date:  04/16/2012   Anticipated DC Plan:  SKILLED NURSING FACILITY      DC Planning Services  CM consult      Choice offered to / List presented to:             Status of service:  Completed, signed off Medicare Important Message given?   (If response is "NO", the following Medicare IM given date fields will be blank) Date Medicare IM given:   Date Additional Medicare IM given:    Discharge Disposition:  SKILLED NURSING FACILITY  Per UR Regulation:    If discussed at Long Length of Stay Meetings, dates discussed:    Comments:

## 2012-04-15 NOTE — Progress Notes (Signed)
UR COMPLETED  

## 2012-04-15 NOTE — Progress Notes (Signed)
Physical Therapy Treatment Patient Details Name: [name] MRN: [id number] DOB: [birth year] Today's Date: 04/15/2012 Time: 4098-1191 PT Time Calculation (min): 27 min  PT Assessment / Plan / Recommendation Comments on Treatment Session  Patient agreeable to out of bed to chair. Patient able to take a couple of steps with max encouragement. Progressing well. Continue with current POC    Follow Up Recommendations  SNF     Does the patient have the potential to tolerate intense rehabilitation     Barriers to Discharge        Equipment Recommendations  None recommended by PT    Recommendations for Other Services    Frequency Min 5X/week   Plan Discharge plan remains appropriate;Frequency remains appropriate    Precautions / Restrictions Precautions Precautions: Fall Restrictions RLE Weight Bearing: Partial weight bearing RLE Partial Weight Bearing Percentage or Pounds: 25-50%   Pertinent Vitals/Pain     Mobility  Bed Mobility Supine to Sit: 3: Mod assist;With rails Sitting - Scoot to Edge of Bed: 2: Max assist Details for Bed Mobility Assistance: cues for step-by-step through bed mobility.  A with shoulders and LEs Transfers Sit to Stand: 1: +2 Total assist;With upper extremity assist;From bed Sit to Stand: Patient Percentage: 50% Stand to Sit: 1: +2 Total assist;With upper extremity assist;To chair/3-in-1;With armrests Stand to Sit: Patient Percentage: 50% Stand Pivot Transfers: 1: +2 Total assist Stand Pivot Transfers: Patient Percentage: 50% Details for Transfer Assistance: Patient transferred to recliner then requested to use BSC. A with intiation of stand and to ensure balance as patietn attemping to put increase weight on LEs for ambulation prep Ambulation/Gait Ambulation/Gait Assistance: 1: +2 Total assist Ambulation/Gait: Patient Percentage: 50% Ambulation Distance (Feet): 6 Feet Assistive device: Rolling walker Ambulation/Gait Assistance Details: Cues for  technique/sequency/use of RW Gait Pattern: Step-to pattern;Decreased step length - right;Decreased step length - left;Narrow base of support;Trunk flexed Gait velocity: decreased    Exercises General Exercises - Lower Extremity Ankle Circles/Pumps: 10 reps;AROM;Right Quad Sets: AAROM;Right;10 reps   PT Diagnosis:    PT Problem List:   PT Treatment Interventions:     PT Goals Acute Rehab PT Goals PT Goal: Supine/Side to Sit - Progress: Progressing toward goal PT Goal: Sit to Stand - Progress: Progressing toward goal PT Goal: Stand to Sit - Progress: Progressing toward goal PT Goal: Ambulate - Progress: Progressing toward goal  Visit Information  Last PT Received On: 04/15/12 Assistance Needed: +2    Subjective Data      Cognition  Cognition Overall Cognitive Status: Appears within functional limits for tasks assessed/performed Arousal/Alertness: Awake/alert Orientation Level: Appears intact for tasks assessed Behavior During Session: [facility] for tasks performed    Balance     End of Session PT - End of Session Equipment Utilized During Treatment: Gait belt Activity Tolerance: Patient limited by pain Patient left: in chair;with call bell/phone within reach Nurse Communication: Mobility status   GP     Fredrich Birks 04/15/2012, 12:45 PM 04/15/2012 Fredrich Birks PTA [phone] pager [phone] office

## 2012-04-15 NOTE — Discharge Summary (Addendum)
Physician Discharge Summary  CHALET KERWIN ZOX:[id number] DOB: 06-04-1930 DOA: 04/11/2012  PCP: Hoyle Sauer, MD  Admit date: 04/11/2012 Discharge date: 04/16/2012  Time spent: 35 minutes  Recommendations for Outpatient Follow-up:  Followup with Dr. Magnus Ivan, ortho in 2 weeks.  Followup with Hoyle Sauer, MD (PCP) in 1 week.  Discharge Diagnoses:  Principal Problem:   Closed right hip fracture Active Problems:   COPD (chronic obstructive pulmonary disease)   HTN (hypertension)   AAA (abdominal aortic aneurysm)   Rheumatoid arthritis   CAD (coronary artery disease), native coronary artery - PCI to RCA & Circumflex 2004   DM (diabetes mellitus), type 2, uncontrolled with complications   Anemia   Discharge Condition: Stable  Diet recommendation: Diabetic diet.  Filed Weights   04/12/12 0034  Weight: 57.153 kg (126 lb)    History of present illness:  On admission: "[name] is a 79 y.o. female with history of CAD status post stenting, abdominal aortic aneurysm status post repair, hypertension, rheumatoid arthritis was brought to the ER patient had a mechanical fall last evening while moving groceries on 04/11/2012."  Hospital Course:  Right hip fracture Dr. Magnus Ivan, orthopedic service performed intramedullary nail intertochanteric on 04/12/2012.  Up with therapy with only 25-50% weight on right leg. Aspirin discontinued, started on coumadin. Duration of coumadin to be determined by Dr. Magnus Ivan.   E. Coli UTI Sensitive to cefazolin and ceftriaxone. Given one dose of ceftriaxone on 04/15/2012. Transition to cefuroxime, define 7 day course of antibiotics given recent hardware implantation and surgery, till 04/21/2012.  Nausea and vomiting Likely due to adverse reaction from methocarbamol. Continue antiemetics. Neurologically intact, initial head CT negative, do not see the need for repeat imaging.  Anti-medics as needed.  CAD status post stenting  Continue  low-dose metoprolol 12.5 mg twice daily.  Resume ASA 81 mg once patient is done with her course of coumadin.  Hypertension Stable.  Continue metoprolol.  Rheumatoid arthritis Continue Plaquenil.  COPD  Stable.  Not an active issue at this time.  History of abdominal aortic aneurysm repair Stable.  Scalp hematoma secondary to fall Stable.  DM2 uncontrolled with complications Hemoglobin A1c 6.8, diabetic by definition.  Sliding scale insulin.  Start metformin 500 mg po BID, titrate as outpatient.  Anemia Acute blood loss anemia from hip fracture. Transfuse 1 unit of pRBC on 04/14/2012 with appropriate response. Hemoglobin stable.  Thrombocytopenia Likely due to hip fracture, improved.  Dry eyes Artifical tears.  Mouth/Throat pain Likely due to intubation from surgery, no oropharyngeal lesions. Continue magic mouth wash as needed. Define 3 day empiric course of fluconazole for possible oral candidiasis.  Consultants:  Ortho, Dr. Magnus Ivan.  Cardiology, Dr. Rennis Golden  Procedures:  As above.  Antibiotics:  Ceftriaxone 04/15/2012  Cefuroxime 04/16/2012 >> (Till 04/21/2012)  Fluconazole 04/15/2012 >> (Till 04/17/2012)  Discharge Exam: Filed Vitals:   04/15/12 1034 04/15/12 1337 04/15/12 2349 04/16/12 0629  BP:  104/52 117/45 120/85  Pulse: 87 69 72 86  Temp:  99 F (37.2 C) 97.8 F (36.6 C) 98.1 F (36.7 C)  TempSrc:      Resp:  18 18 18   Height:      Weight:      SpO2:  99% 100% 100%    Discharge Instructions      Discharge Orders   Future Orders Complete By Expires     Diet Carb Modified  As directed     Discharge instructions  As directed     Comments:  Followup with Dr. Magnus Ivan, ortho in 2 weeks.  Followup with Hoyle Sauer, MD (PCP) in 1 week.  Pharmacy at SNF to adjust coumadin dosing, please check PT/INR on 04/18/2012 and have dose of coumadin adjusted.    Increase activity slowly  As directed         Medication List    STOP taking  these medications       acetaminophen 650 MG CR tablet  Commonly known as:  TYLENOL     amLODipine 5 MG tablet  Commonly known as:  NORVASC     aspirin 81 MG tablet      TAKE these medications       acetaminophen 325 MG tablet  Commonly known as:  TYLENOL  Take 2 tablets (650 mg total) by mouth every 6 (six) hours as needed.     CALTRATE 600+D 600-400 MG-UNIT per tablet  Generic drug:  Calcium Carbonate-Vitamin D  Take 1 tablet by mouth daily.     cefUROXime 500 MG tablet  Commonly known as:  CEFTIN  Take 1 tablet (500 mg total) by mouth 2 (two) times daily with a meal. Till 04/21/2012     CENTRUM SILVER PO  Take 1 tablet by mouth daily.     DSS 100 MG Caps  Take 100 mg by mouth 2 (two) times daily.     feeding supplement Liqd  Take 237 mLs by mouth 3 (three) times daily between meals.     ferrous sulfate 325 (65 FE) MG tablet  Take 1 tablet (325 mg total) by mouth 3 (three) times daily after meals.     FLAX SEED OIL PO  Take 1 tablet by mouth daily. Hold while in hospital     fluconazole 100 MG tablet  Commonly known as:  DIFLUCAN  Take 1 tablet (100 mg total) by mouth daily. Till 04/17/2012     HYDROcodone-acetaminophen 5-325 MG per tablet  Commonly known as:  NORCO  Take 1 tablet by mouth every 6 (six) hours as needed for pain.     hydroxychloroquine 200 MG tablet  Commonly known as:  PLAQUENIL  Take 200 mg by mouth daily.     magic mouthwash Soln  Take 5 mLs by mouth 3 (three) times daily as needed (Mouth pain.).     menthol-cetylpyridinium 3 MG lozenge  Commonly known as:  CEPACOL  Take 1 lozenge (3 mg total) by mouth as needed (Sore throat).     metFORMIN 500 MG tablet  Commonly known as:  GLUCOPHAGE  Take 1 tablet (500 mg total) by mouth 2 (two) times daily with a meal.     metoprolol tartrate 12.5 mg Tabs  Commonly known as:  LOPRESSOR  Take 0.5 tablets (12.5 mg total) by mouth 2 (two) times daily.     omeprazole 20 MG tablet  Commonly known  as:  PRILOSEC OTC  Take 20 mg by mouth daily.     ondansetron 4 MG tablet  Commonly known as:  ZOFRAN  Take 1 tablet (4 mg total) by mouth every 6 (six) hours as needed for nausea.     phenol 1.4 % Liqd  Commonly known as:  CHLORASEPTIC  Use as directed 1 spray in the mouth or throat as needed (Sore throat).     polyvinyl alcohol 1.4 % ophthalmic solution  Commonly known as:  LIQUIFILM TEARS  Place 1 drop into both eyes as needed (Dry eyes.).     promethazine 12.5 MG tablet  Commonly known as:  PHENERGAN  Take 12.5 mg by mouth every 6 (six) hours as needed. PT TAKES WITH TRAMADOL     traMADol 50 MG tablet  Commonly known as:  ULTRAM  Take 1 tablet (50 mg total) by mouth every 6 (six) hours as needed for pain. Pain     vitamin C 500 MG tablet  Commonly known as:  ASCORBIC ACID  Take 500 mg by mouth daily.     Vitamin D3 2000 UNITS Tabs  Take 1 tablet by mouth daily.     warfarin 2.5 MG tablet  Commonly known as:  COUMADIN  Take 1 tablet (2.5 mg total) by mouth daily at 6 PM.       Follow-up Information   Follow up with Kathryne Hitch, MD. Schedule an appointment as soon as possible for a visit in 2 weeks.   Contact information:   [address] [place] [address] [phone]       Follow up with Hoyle Sauer, MD. Schedule an appointment as soon as possible for a visit in 1 week.   Contact information:   [address] [facility] [place] [address] [phone]        The results of significant diagnostics from this hospitalization (including imaging, microbiology, ancillary and laboratory) are listed below for reference.    Significant Diagnostic Studies: Dg Hip Complete Right  04/11/2012  *RADIOLOGY REPORT*  Clinical Data: Right hip pain status post fall  RIGHT HIP - COMPLETE 2+ VIEW  Comparison: 07/15/2010 MRI  Findings: Comminuted fracture of the right femur with displacement of the lesser trochanter and  subtrochanteric extension. The femoral head remains seated within the acetabulum though in a forced abducted position.  The distal dominant fragment is displaced proximally.  Moderate right hip DJD.  Diffuse osteopenia.  Left hip arthroplasty.  Advanced atherosclerotic disease.  Iliac stent.  IMPRESSION: Right inter/subtrochanteric femur fracture.  Per CMS PQRS reporting requirements (PQRS Measure 24): Given the patient's age of greater than 50 and the fracture site (hip, distal radius, or spine), the patient should be tested for osteoporosis using DXA, and the appropriate treatment considered based on the DXA results.   Original Report Authenticated By: Jearld Lesch, M.D.    Dg Hip Operative Right  04/12/2012  *RADIOLOGY REPORT*  Clinical Data: Right hip fracture.  DG OPERATIVE RIGHT HIP  Comparison: 1 day prior  Findings: Placement of an intermedullary rod with proximal screw fixation across the previously described comminuted intertrochanteric femur fracture. No acute hardware complication.  IMPRESSION: Intraoperative fixation of proximal right femur.   Original Report Authenticated By: Jeronimo Greaves, M.D.    Ct Head Wo Contrast  04/11/2012  *RADIOLOGY REPORT*  Clinical Data:  Larey Seat, head pain, neck pain.  CT HEAD WITHOUT CONTRAST CT CERVICAL SPINE WITHOUT CONTRAST  Technique:  Multidetector CT imaging of the head and cervical spine was performed following the standard protocol without intravenous contrast.  Multiplanar CT image reconstructions of the cervical spine were also generated.  Comparison:   None  CT HEAD  Findings: There is no evidence for acute infarction, intracranial hemorrhage, mass lesion, hydrocephalus, or extra-axial fluid.  Mild age related atrophy.  Mild chronic microvascular ischemic change. No skull fracture.  No subdural hematoma.  Large right parietal scalp hematoma.  Vascular calcification.  No acute sinus or mastoid fluid.  Negative orbits.  IMPRESSION: Chronic changes as  described.  Large right parietal scalp hematoma. No skull fracture or intracranial hemorrhage.  CT CERVICAL SPINE  Findings:  There is no visible cervical spine fracture or traumatic subluxation.  There is advanced disc space narrowing at C6-C7. Mild pannus.  Calcified central protrusion C3-C4 and C4-5.  Airway midline.  COPD. Advanced vascular calcification.  Mild scoliosis convex left.  Advanced facet arthropathy.  IMPRESSION: No visible cervical spine fracture or traumatic subluxation.   Original Report Authenticated By: Davonna Belling, M.D.    Ct Cervical Spine Wo Contrast  04/11/2012  *RADIOLOGY REPORT*  Clinical Data:  Larey Seat, head pain, neck pain.  CT HEAD WITHOUT CONTRAST CT CERVICAL SPINE WITHOUT CONTRAST  Technique:  Multidetector CT imaging of the head and cervical spine was performed following the standard protocol without intravenous contrast.  Multiplanar CT image reconstructions of the cervical spine were also generated.  Comparison:   None  CT HEAD  Findings: There is no evidence for acute infarction, intracranial hemorrhage, mass lesion, hydrocephalus, or extra-axial fluid.  Mild age related atrophy.  Mild chronic microvascular ischemic change. No skull fracture.  No subdural hematoma.  Large right parietal scalp hematoma.  Vascular calcification.  No acute sinus or mastoid fluid.  Negative orbits.  IMPRESSION: Chronic changes as described.  Large right parietal scalp hematoma. No skull fracture or intracranial hemorrhage.  CT CERVICAL SPINE  Findings: There is no visible cervical spine fracture or traumatic subluxation.  There is advanced disc space narrowing at C6-C7. Mild pannus.  Calcified central protrusion C3-C4 and C4-5.  Airway midline.  COPD. Advanced vascular calcification.  Mild scoliosis convex left.  Advanced facet arthropathy.  IMPRESSION: No visible cervical spine fracture or traumatic subluxation.   Original Report Authenticated By: Davonna Belling, M.D.    Dg Chest Portable 1  View  04/12/2012  *RADIOLOGY REPORT*  Clinical Data: Rule out infiltrate  PORTABLE CHEST - 1 VIEW  Comparison: 01/24/2012  Findings: Lungs remain clear without evidence of heart failure or pneumonia.  No pleural effusion.  Right shoulder replacement noted.  IMPRESSION: No acute abnormality and no interval change.   Original Report Authenticated By: Janeece Riggers, M.D.     Microbiology: Recent Results (from the past 240 hour(s))  URINE CULTURE     Status: None   Collection Time    04/12/12  7:47 AM      Result Value Range Status   Specimen Description URINE, CATHETERIZED   Final   Special Requests NONE   Final   Culture  Setup Time 04/12/2012 09:06   Final   Colony Count 10,000 COLONIES/ML   Final   Culture ESCHERICHIA COLI   Final   Report Status 04/14/2012 FINAL   Final   Organism ID, Bacteria ESCHERICHIA COLI   Final  SURGICAL PCR SCREEN     Status: None   Collection Time    04/12/12  3:03 PM      Result Value Range Status   MRSA, PCR NEGATIVE  NEGATIVE Final   Staphylococcus aureus NEGATIVE  NEGATIVE Final   Comment:            The Xpert SA Assay (FDA     approved for NASAL specimens     in patients over 54 years of age),     is one component of     a comprehensive surveillance     program.  Test performance has     been validated by [employer] for patients greater     than or equal to 49 year old.     It is not intended     to  diagnose infection nor to     guide or monitor treatment.     Labs: Basic Metabolic Panel:  Recent Labs Lab 04/11/12 1957 04/12/12 0006 04/12/12 0505 04/13/12 0555 04/14/12 0615 04/16/12 0600  NA 136  --  137 137 135 135  K 3.6  --  5.0 4.4 4.1 4.3  CL 100  --  104 105 103 102  CO2 24  --  26 27 26 27   GLUCOSE 285*  --  179* 145* 94 132*  BUN 22  --  20 15 17 7   CREATININE 0.68  --  0.57 0.70 0.68 0.47*  CALCIUM 8.8 8.8 8.6 7.8* 8.3* 8.4   Liver Function Tests:  Recent Labs Lab 04/12/12 0006  ALBUMIN 3.4*   No results  found for this basename: LIPASE, AMYLASE,  in the last 168 hours No results found for this basename: AMMONIA,  in the last 168 hours CBC:  Recent Labs Lab 04/11/12 1957 04/12/12 0505 04/13/12 0555 04/14/12 0615 04/15/12 0700 04/16/12 0600  WBC 8.5 12.1* 10.5 10.2 8.7 6.5  NEUTROABS 6.5  --   --   --   --   --   HGB 12.5 11.0* 8.6* 7.5* 9.1* 8.9*  HCT 37.4 33.0* 26.0* 21.6* 26.3* 25.7*  MCV 88.6 87.8 90.3 88.2 87.1 87.4  PLT 126* 150 92* 83* 96* 120*   Cardiac Enzymes: No results found for this basename: CKTOTAL, CKMB, CKMBINDEX, TROPONINI,  in the last 168 hours BNP: BNP (last 3 results) No results found for this basename: PROBNP,  in the last 8760 hours CBG:  Recent Labs Lab 04/15/12 1235 04/15/12 1558 04/15/12 2307 04/16/12 0647 04/16/12 1153  GLUCAP 121* 134* 125* 127* 127*       Signed:  Partick Musselman A  [facility] 04/16/2012, 1:29 PM

## 2012-04-15 NOTE — Progress Notes (Signed)
Patient still complaining of nausea this evening. Refusing pain medication. Seems to be only present when patient is moving.

## 2012-04-15 NOTE — Progress Notes (Signed)
Subjective: 3 Days Post-Op Procedure(s) (LRB): INTRAMEDULLARY (IM) NAIL INTERTROCHANTRIC Right (Right) Patient reports pain as moderate.  Transfused yesterday due to acute blood loss anemia.  Objective: Vital signs in last 24 hours: Temp:  [98.6 F (37 C)-99.7 F (37.6 C)] 99.2 F (37.3 C) (02/24 0524) Pulse Rate:  [76-94] 80 (02/24 0524) Resp:  [16-18] 18 (02/24 0524) BP: (103-118)/(46-58) 110/56 mmHg (02/24 0524) SpO2:  [96 %-100 %] 100 % (02/24 0524)  Intake/Output from previous day: 02/23 0701 - 02/24 0700 In: 850 [P.O.:600; I.V.:250] Out: 0  Intake/Output this shift:     Recent Labs  04/13/12 0555 04/14/12 0615  HGB 8.6* 7.5*    Recent Labs  04/13/12 0555 04/14/12 0615  WBC 10.5 10.2  RBC 2.88* 2.45*  HCT 26.0* 21.6*  PLT 92* 83*    Recent Labs  04/13/12 0555 04/14/12 0615  NA 137 135  K 4.4 4.1  CL 105 103  CO2 27 26  BUN 15 17  CREATININE 0.70 0.68  GLUCOSE 145* 94  CALCIUM 7.8* 8.3*   No results found for this basename: LABPT, INR,  in the last 72 hours  Sensation intact distally Intact pulses distally Dorsiflexion/Plantar flexion intact Incision: scant drainage No cellulitis present  Assessment/Plan: 3 Days Post-Op Procedure(s) (LRB): INTRAMEDULLARY (IM) NAIL INTERTROCHANTRIC Right (Right) Discharge to SNF likely tomorrow.  Kathryne Hitch 04/15/2012, 7:43 AM

## 2012-04-16 ENCOUNTER — Inpatient Hospital Stay (HOSPITAL_COMMUNITY): Payer: Medicare Other

## 2012-04-16 DIAGNOSIS — J449 Chronic obstructive pulmonary disease, unspecified: Secondary | ICD-10-CM

## 2012-04-16 LAB — BASIC METABOLIC PANEL
BUN: 7 mg/dL (ref 6–23)
CO2: 27 mEq/L (ref 19–32)
Calcium: 8.4 mg/dL (ref 8.4–10.5)
Creatinine, Ser: 0.47 mg/dL — ABNORMAL LOW (ref 0.50–1.10)
GFR calc non Af Amer: >90 mL/min (ref >90)
Glucose, Bld: 132 mg/dL — ABNORMAL HIGH (ref 70–99)
Sodium: 135 mEq/L (ref 135–145)

## 2012-04-16 LAB — CBC
Hemoglobin: 8.9 g/dL — ABNORMAL LOW (ref 12.0–15.0)
MCH: 30.3 pg (ref 26.0–34.0)
MCHC: 34.6 g/dL (ref 30.0–36.0)
MCV: 87.4 fL (ref 78.0–100.0)
RBC: 2.94 MIL/uL — ABNORMAL LOW (ref 3.87–5.11)

## 2012-04-16 LAB — VITAMIN D 1,25 DIHYDROXY: Vitamin D 1, 25 (OH)2 Total: 29 pg/mL (ref 18–72)

## 2012-04-16 MED ORDER — WARFARIN VIDEO: Freq: Once | Status: DC

## 2012-04-16 MED ORDER — FLUCONAZOLE 100 MG PO TABS: 100.0000 mg | ORAL_TABLET | Freq: Every day | ORAL | Status: AC

## 2012-04-16 MED ORDER — GLUCERNA SHAKE PO LIQD: 237.0000 mL | Freq: Three times a day (TID) | ORAL | Status: DC

## 2012-04-16 MED ORDER — WARFARIN - PHARMACIST DOSING INPATIENT: Freq: Every day | Status: DC

## 2012-04-16 MED ORDER — WARFARIN SODIUM 2.5 MG PO TABS
2.5000 mg | ORAL_TABLET | Freq: Once | ORAL | Status: DC
  Filled 2012-04-16: qty 1

## 2012-04-16 MED ORDER — WARFARIN SODIUM 2.5 MG PO TABS: 2.5000 mg | ORAL_TABLET | Freq: Every day | ORAL | Status: DC

## 2012-04-16 MED ORDER — CEFUROXIME AXETIL 500 MG PO TABS: 500.0000 mg | ORAL_TABLET | Freq: Two times a day (BID) | ORAL | Status: AC

## 2012-04-16 MED ORDER — ONDANSETRON HCL 4 MG PO TABS: 4.0000 mg | ORAL_TABLET | Freq: Four times a day (QID) | ORAL | Status: DC | PRN

## 2012-04-16 MED ORDER — COUMADIN BOOK
Freq: Once | Status: DC
  Filled 2012-04-16: qty 1

## 2012-04-16 NOTE — Progress Notes (Addendum)
ANTICOAGULATION CONSULT NOTE - Initial Consult  Pharmacy Consult for Coumadin Indication: VTE prophylaxis  Allergies  Allergen Reactions  . Codeine Nausea And Vomiting, Swelling and Rash  . Hydrocodone Nausea And Vomiting, Swelling and Rash  . Morphine And Related Other (See Comments)    hallucinations  . Other Nausea And Vomiting    Most pain meds other than tramadol. Tylenol, Toradol, and Robaxin are ok.  PT STATES SHE NEEDS PAPER TAPE--SKIN VERY FRAGILE  . Penicillins Hives  . Darvocet (Propoxyphene-Acetaminophen)   . Demerol (Meperidine)   . Statins     INTOLERANT OF STATINS    Patient Measurements: Height: 5\' 3"  (160 cm) Weight: 126 lb (57.153 kg) IBW/kg (Calculated) : 52.4  Vital Signs: Temp: 98.1 F (36.7 C) (02/25 0629) BP: 120/85 mmHg (02/25 0629) Pulse Rate: 86 (02/25 0629)  Labs:  Recent Labs  04/14/12 0615 04/15/12 0700 04/16/12 0600  HGB 7.5* 9.1* 8.9*  HCT 21.6* 26.3* 25.7*  PLT 83* 96* 120*  CREATININE 0.68  --  0.47*    Estimated Creatinine Clearance: 46.4 ml/min (by C-G formula based on Cr of 0.47).   Medical History: Past Medical History  Diagnosis Date  . Angina     Treated with PCI in 2004; no active Symptoms.  . Pneumonia 12/12/10  . Hiatal hernia   . Arthritis   . GERD (gastroesophageal reflux disease)   . COPD (chronic obstructive pulmonary disease)     pt. denies having this  . Iron deficiency anemia   . Barrett esophagus   . HTN (hypertension)   . DJD (degenerative joint disease)   . Osteoporosis   . HLD (hyperlipidemia)   . Internal hemorrhoids   . Diverticulosis   . Shortness of breath     MILD- WITH EXERTION  . Pain     RIGHT SHOULDER WITH LIMITED ROM--STATES PREVIOUS RT TOTAL SHOULDDER REPLACEMENT-BUT STILL HAS PROBLEMS WITH SHOULDER  . CAD (coronary artery disease), native coronary artery 2004    PCI-RCA (2.42mm x 12mm Taxus DES)& Cx (2.75 mm x 14 mm Cypher DES)    Medications:  Scheduled:  . [COMPLETED]  cefTRIAXone (ROCEPHIN)  IV  1 g Intravenous Once  . cefUROXime  500 mg Oral BID WC  . docusate sodium  100 mg Oral BID  . feeding supplement  237 mL Oral TID BM  . ferrous sulfate  325 mg Oral TID PC  . fluconazole  100 mg Oral Daily  . hydroxychloroquine  200 mg Oral Daily  . insulin aspart  0-5 Units Subcutaneous QHS  . insulin aspart  0-9 Units Subcutaneous TID WC  . metFORMIN  500 mg Oral BID WC  . metoprolol tartrate  12.5 mg Oral BID  . pantoprazole  40 mg Oral Daily  . [DISCONTINUED] aspirin EC  325 mg Oral Q breakfast  . [DISCONTINUED] cefUROXime  500 mg Oral BID WC  . [DISCONTINUED] ciprofloxacin  250 mg Oral BID   Infusions:  . lactated ringers 50 mL/hr at 04/14/12 2200    Assessment: 79 y/o female patient s/ fall with right hip fracture, now pod#4 s/p IM nail repair requiring coumadin for dvt prophylaxis. Given advanced age, low weight and noted drug interaction with diflucan, will begin with lower dose. Noted baseline thrombocytopenia.  Goal of Therapy:  INR 2-3 Monitor platelets by anticoagulation protocol: Yes   Plan:  Coumadin 2.5mg  today and f/u daily protime.  Verlene Mayer, PharmD, BCPS Pager [phone] 04/16/2012,8:36 AM

## 2012-04-16 NOTE — Progress Notes (Signed)
Physical Therapy Treatment Patient Details Name: [name] MRN: [id number] DOB: 1930-04-15 Today's Date: 04/16/2012 Time: 4098-1191 PT Time Calculation (min): 24 min  PT Assessment / Plan / Recommendation Comments on Treatment Session  Patient agreeable to OOB to chair but did not want to attempt ambulation as she has been nauseated since last night. Patient making slow progress with therapy. Continue to recommend SNF for rehab to increase functional mobility    Follow Up Recommendations  SNF     Does the patient have the potential to tolerate intense rehabilitation     Barriers to Discharge        Equipment Recommendations  None recommended by PT    Recommendations for Other Services    Frequency Min 5X/week   Plan Discharge plan remains appropriate;Frequency remains appropriate    Precautions / Restrictions Precautions Precautions: Fall Restrictions RLE Weight Bearing: Partial weight bearing RLE Partial Weight Bearing Percentage or Pounds: 25-50%   Pertinent Vitals/Pain     Mobility  Bed Mobility Supine to Sit: 3: Mod assist;With rails Sitting - Scoot to Edge of Bed: 3: Mod assist;With rail Details for Bed Mobility Assistance: cues for step-by-step through bed mobility.  A with shoulders and LEs. Patient utilized trapeze bar this session to help with scooting buttocks towards EOB Transfers Sit to Stand: 1: +2 Total assist;With upper extremity assist;From bed Sit to Stand: Patient Percentage: 60% Stand to Sit: 1: +2 Total assist;With upper extremity assist;To chair/3-in-1;With armrests Stand to Sit: Patient Percentage: 60% Stand Pivot Transfers: 1: +2 Total assist Stand Pivot Transfers: Patient Percentage: 60% Details for Transfer Assistance: Patient able to assist more this session with SPT. Cues for technique and use of RW. Patient making good pivotal steps.  Ambulation/Gait Ambulation/Gait Assistance: Other (comment)    Exercises General Exercises - Lower  Extremity Ankle Circles/Pumps: 10 reps;AROM;Right Quad Sets: AAROM;Right;5 reps Heel Slides: AAROM;Right;5 reps Hip ABduction/ADduction: AAROM;Right;5 reps   PT Diagnosis:    PT Problem List:   PT Treatment Interventions:     PT Goals Acute Rehab PT Goals PT Goal: Supine/Side to Sit - Progress: Progressing toward goal PT Goal: Sit to Stand - Progress: Progressing toward goal PT Goal: Stand to Sit - Progress: Progressing toward goal  Visit Information  Last PT Received On: 04/16/12 Assistance Needed: +2    Subjective Data      Cognition  Cognition Overall Cognitive Status: Appears within functional limits for tasks assessed/performed Arousal/Alertness: Awake/alert Orientation Level: Appears intact for tasks assessed Behavior During Session: [facility] for tasks performed    Balance     End of Session PT - End of Session Equipment Utilized During Treatment: Gait belt Activity Tolerance: Patient limited by pain Patient left: in chair;with call bell/phone within reach Nurse Communication: Mobility status   GP     Fredrich Birks 04/16/2012, 10:59 AM 04/16/2012 Fredrich Birks PTA [phone] pager [phone] office

## 2012-04-16 NOTE — Progress Notes (Signed)
[facility] PROGRESS NOTE  [name] NFA:[id number] DOB: Dec 27, 1930 DOA: 04/11/2012 PCP: Hoyle Sauer, MD  Assessment/Plan: Right hip fracture Dr. Magnus Ivan, orthopedic service performed intramedullary nail intertochanteric on 04/12/2012.  Up with therapy with only 25-50% weight on right leg. Aspirin discontinued, started on coumadin. Duration of coumadin to be determined by Dr. Magnus Ivan.   E. Coli UTI Sensitive to cefazolin and ceftriaxone. Given one dose of ceftriaxone on 04/15/2012. Transition to cefuroxime, define 7 day course of antibiotics given recent hardware implantation and surgery.  Nausea and vomiting Likely due to adverse reaction from methocarbamol. Continue antiemetics. Neurologically intact, initial head CT negative, do not see the need for repeat imaging.  CAD status post stenting  Continue low-dose metoprolol 12.5 mg twice daily.  Resume ASA 81 mg once patient is done with her course of coumadin.  Hypertension Stable.  Continue metoprolol.  Rheumatoid arthritis Continue Plaquenil.  COPD  Stable.  Not an active issue at this time.  History of abdominal aortic aneurysm repair Stable.  Scalp hematoma secondary to fall Stable.  DM2 uncontrolled with complications Hemoglobin A1c 6.8, diabetic by definition.  Sliding scale insulin.  Start metformin 500 mg po BID, titrate as outpatient.  Anemia Acute blood loss anemia from hip fracture. Transfuse 1 unit of pRBC on 04/14/2012 with appropriate response. Hemoglobin stable.  Thrombocytopenia Likely due to hip fracture, improved.  Dry eyes Artifical tears.  Mouth/Throat pain Likely due to intubation from surgery, no oropharyngeal lesions. Continue magic mouth wash as needed. Define 3 day empiric course of fluconazole.  Code Status: Full code. Family Communication: No family at bedside.  Patient updated. Disposition Plan: SNF when bed is available.   Consultants:  Ortho, Dr.  Magnus Ivan.  Cardiology, Dr. Rennis Golden  Procedures:  As above.  Antibiotics:  Ceftriaxone 04/15/2012  Cefuroxime 04/16/2012 >> (Till 04/21/2012)  Fluconazole 04/15/2012 >> (Till 04/17/2012)  HPI/Subjective: Had nausea and vomiting this morning. Still feels weak from the hip pain.  Objective: Filed Vitals:   04/15/12 1034 04/15/12 1337 04/15/12 2349 04/16/12 0629  BP:  104/52 117/45 120/85  Pulse: 87 69 72 86  Temp:  99 F (37.2 C) 97.8 F (36.6 C) 98.1 F (36.7 C)  TempSrc:      Resp:  18 18 18   Height:      Weight:      SpO2:  99% 100% 100%    Intake/Output Summary (Last 24 hours) at 04/16/12 1108 Last data filed at 04/15/12 1841  Gross per 24 hour  Intake    240 ml  Output    350 ml  Net   -110 ml   Filed Weights   04/12/12 0034  Weight: 57.153 kg (126 lb)    Exam: Physical Exam: General: Awake, Oriented, No acute distress. HEENT: EOMI. No redness in eyes bilaterally. No erythema or lesions in the oropharynx. Neck: Supple CV: S1 and S2 Lungs: Clear to ascultation bilaterally Abdomen: Soft, Nontender, Nondistended, +bowel sounds. Ext: Good pulses. Trace edema.  Data Reviewed: Basic Metabolic Panel:  Recent Labs Lab 04/11/12 1957 04/12/12 0006 04/12/12 0505 04/13/12 0555 04/14/12 0615 04/16/12 0600  NA 136  --  137 137 135 135  K 3.6  --  5.0 4.4 4.1 4.3  CL 100  --  104 105 103 102  CO2 24  --  26 27 26 27   GLUCOSE 285*  --  179* 145* 94 132*  BUN 22  --  20 15 17 7   CREATININE 0.68  --  0.57 0.70  0.68 0.47*  CALCIUM 8.8 8.8 8.6 7.8* 8.3* 8.4   Liver Function Tests:  Recent Labs Lab 04/12/12 0006  ALBUMIN 3.4*   No results found for this basename: LIPASE, AMYLASE,  in the last 168 hours No results found for this basename: AMMONIA,  in the last 168 hours CBC:  Recent Labs Lab 04/11/12 1957 04/12/12 0505 04/13/12 0555 04/14/12 0615 04/15/12 0700 04/16/12 0600  WBC 8.5 12.1* 10.5 10.2 8.7 6.5  NEUTROABS 6.5  --   --   --   --   --    HGB 12.5 11.0* 8.6* 7.5* 9.1* 8.9*  HCT 37.4 33.0* 26.0* 21.6* 26.3* 25.7*  MCV 88.6 87.8 90.3 88.2 87.1 87.4  PLT 126* 150 92* 83* 96* 120*   Cardiac Enzymes: No results found for this basename: CKTOTAL, CKMB, CKMBINDEX, TROPONINI,  in the last 168 hours BNP (last 3 results) No results found for this basename: PROBNP,  in the last 8760 hours CBG:  Recent Labs Lab 04/15/12 0711 04/15/12 1235 04/15/12 1558 04/15/12 2307 04/16/12 0647  GLUCAP 93 121* 134* 125* 127*    Recent Results (from the past 240 hour(s))  URINE CULTURE     Status: None   Collection Time    04/12/12  7:47 AM      Result Value Range Status   Specimen Description URINE, CATHETERIZED   Final   Special Requests NONE   Final   Culture  Setup Time 04/12/2012 09:06   Final   Colony Count 10,000 COLONIES/ML   Final   Culture ESCHERICHIA COLI   Final   Report Status 04/14/2012 FINAL   Final   Organism ID, Bacteria ESCHERICHIA COLI   Final  SURGICAL PCR SCREEN     Status: None   Collection Time    04/12/12  3:03 PM      Result Value Range Status   MRSA, PCR NEGATIVE  NEGATIVE Final   Staphylococcus aureus NEGATIVE  NEGATIVE Final   Comment:            The Xpert SA Assay (FDA     approved for NASAL specimens     in patients over 91 years of age),     is one component of     [name] comprehensive surveillance     program.  Test performance has     been validated by [employer] for patients greater     than or equal to 22 year old.     It is not intended     to diagnose infection nor to     guide or monitor treatment.     Studies: Dg Ribs Unilateral Right  04/15/2012  *RADIOLOGY REPORT*  Clinical Data: Rib pain after fall.  RIGHT RIBS - 2 VIEW  Comparison: Chest x-ray 04/12/2012.  Findings: Two views of the right ribs demonstrate no acute displaced rib fractures.  Visualized portions of the thorax are remarkable for post vertebroplasty changes in the mid-thoracic spine, and extensive atherosclerosis in  the thoracic aorta. Surgical clips project over the right upper quadrant of the abdomen, likely from prior cholecystectomy.  Status post right shoulder arthroplasty.  IMPRESSION: 1.  No definite acute displaced right-sided rib fractures.   Original Report Authenticated By: Trudie Reed, M.D.     Scheduled Meds: . cefUROXime  500 mg Oral BID WC  . coumadin book   Does not apply Once  . docusate sodium  100 mg Oral BID  . feeding supplement  237 mL Oral TID BM  . ferrous sulfate  325 mg Oral TID PC  . fluconazole  100 mg Oral Daily  . hydroxychloroquine  200 mg Oral Daily  . insulin aspart  0-5 Units Subcutaneous QHS  . insulin aspart  0-9 Units Subcutaneous TID WC  . metFORMIN  500 mg Oral BID WC  . metoprolol tartrate  12.5 mg Oral BID  . pantoprazole  40 mg Oral Daily  . warfarin  2.5 mg Oral ONCE-1800  . warfarin   Does not apply Once  . Warfarin - Pharmacist Dosing Inpatient   Does not apply q1800   Continuous Infusions: . lactated ringers 50 mL/hr at 04/14/12 2200    Principal Problem:   Closed right hip fracture Active Problems:   COPD (chronic obstructive pulmonary disease)   HTN (hypertension)   AAA (abdominal aortic aneurysm)   Rheumatoid arthritis   CAD (coronary artery disease), native coronary artery - PCI to RCA & Circumflex 2004   DM (diabetes mellitus), type 2, uncontrolled with complications   Anemia    Michelle Resetar A, MD  [facility] Pager [phone]. If 7PM-7AM, please contact night-coverage at [email or web address], password [facility] 04/16/2012, 11:08 AM  LOS: 5 days

## 2012-04-16 NOTE — Progress Notes (Signed)
Subjective: 4 Days Post-Op Procedure(s) (LRB): INTRAMEDULLARY (IM) NAIL INTERTROCHANTRIC Right (Right) Patient reports pain as moderate.  Quite a bit of difficulty with mobility..  Objective: Vital signs in last 24 hours: Temp:  [97.8 F (36.6 C)-99 F (37.2 C)] 98.1 F (36.7 C) (02/25 0629) Pulse Rate:  [69-87] 86 (02/25 0629) Resp:  [18] 18 (02/25 0629) BP: (104-120)/(45-85) 120/85 mmHg (02/25 0629) SpO2:  [99 %-100 %] 100 % (02/25 0629)  Intake/Output from previous day: 02/24 0701 - 02/25 0700 In: 360 [P.O.:360] Out: 350 [Urine:350] Intake/Output this shift:     Recent Labs  04/14/12 0615 04/15/12 0700 04/16/12 0600  HGB 7.5* 9.1* 8.9*    Recent Labs  04/15/12 0700 04/16/12 0600  WBC 8.7 6.5  RBC 3.02* 2.94*  HCT 26.3* 25.7*  PLT 96* 120*    Recent Labs  04/14/12 0615  NA 135  K 4.1  CL 103  CO2 26  BUN 17  CREATININE 0.68  GLUCOSE 94  CALCIUM 8.3*   No results found for this basename: LABPT, INR,  in the last 72 hours  Intact pulses distally Dorsiflexion/Plantar flexion intact Incision: scant drainage No cellulitis present Compartment soft  Assessment/Plan: 4 Days Post-Op Procedure(s) (LRB): INTRAMEDULLARY (IM) NAIL INTERTROCHANTRIC Right (Right) Discharge to SNF Stop aspirin and start coumadin given her immobility.  Kathryne Hitch 04/16/2012, 7:48 AM

## 2012-05-17 ENCOUNTER — Non-Acute Institutional Stay (SKILLED_NURSING_FACILITY): Payer: Medicare Other | Admitting: Adult Health

## 2012-05-17 DIAGNOSIS — Z7901 Long term (current) use of anticoagulants: Secondary | ICD-10-CM

## 2012-05-17 DIAGNOSIS — S72009S Fracture of unspecified part of neck of unspecified femur, sequela: Secondary | ICD-10-CM

## 2012-05-17 DIAGNOSIS — S72001S Fracture of unspecified part of neck of right femur, sequela: Secondary | ICD-10-CM

## 2012-05-20 ENCOUNTER — Non-Acute Institutional Stay (SKILLED_NURSING_FACILITY): Payer: Medicare Other | Admitting: Adult Health

## 2012-05-20 ENCOUNTER — Other Ambulatory Visit: Payer: Self-pay | Admitting: *Deleted

## 2012-05-20 ENCOUNTER — Encounter: Payer: Self-pay | Admitting: Adult Health

## 2012-05-20 DIAGNOSIS — E118 Type 2 diabetes mellitus with unspecified complications: Secondary | ICD-10-CM

## 2012-05-20 DIAGNOSIS — E1165 Type 2 diabetes mellitus with hyperglycemia: Secondary | ICD-10-CM

## 2012-05-20 DIAGNOSIS — R079 Chest pain, unspecified: Secondary | ICD-10-CM

## 2012-05-20 DIAGNOSIS — F411 Generalized anxiety disorder: Secondary | ICD-10-CM

## 2012-05-20 MED ORDER — LORAZEPAM 0.5 MG PO TABS: ORAL_TABLET | ORAL | Status: DC

## 2012-05-21 HISTORY — PX: NM MYOVIEW LTD: HXRAD82

## 2012-05-24 ENCOUNTER — Other Ambulatory Visit (HOSPITAL_COMMUNITY): Payer: Self-pay | Admitting: Physician Assistant

## 2012-05-24 ENCOUNTER — Non-Acute Institutional Stay (SKILLED_NURSING_FACILITY): Payer: Medicare Other | Admitting: Adult Health

## 2012-05-24 DIAGNOSIS — Z7901 Long term (current) use of anticoagulants: Secondary | ICD-10-CM

## 2012-05-24 DIAGNOSIS — I10 Essential (primary) hypertension: Secondary | ICD-10-CM

## 2012-05-24 DIAGNOSIS — S72001S Fracture of unspecified part of neck of right femur, sequela: Secondary | ICD-10-CM

## 2012-05-24 DIAGNOSIS — R079 Chest pain, unspecified: Secondary | ICD-10-CM

## 2012-05-24 DIAGNOSIS — S72009S Fracture of unspecified part of neck of unspecified femur, sequela: Secondary | ICD-10-CM

## 2012-05-24 DIAGNOSIS — R0602 Shortness of breath: Secondary | ICD-10-CM

## 2012-05-28 ENCOUNTER — Non-Acute Institutional Stay (SKILLED_NURSING_FACILITY): Payer: Medicare Other | Admitting: Adult Health

## 2012-05-28 DIAGNOSIS — S72009S Fracture of unspecified part of neck of unspecified femur, sequela: Secondary | ICD-10-CM

## 2012-05-28 DIAGNOSIS — S72001S Fracture of unspecified part of neck of right femur, sequela: Secondary | ICD-10-CM

## 2012-05-28 DIAGNOSIS — Z7901 Long term (current) use of anticoagulants: Secondary | ICD-10-CM

## 2012-05-29 ENCOUNTER — Non-Acute Institutional Stay (SKILLED_NURSING_FACILITY): Payer: Medicare Other | Admitting: Adult Health

## 2012-05-29 DIAGNOSIS — S72009S Fracture of unspecified part of neck of unspecified femur, sequela: Secondary | ICD-10-CM

## 2012-05-29 DIAGNOSIS — Z7901 Long term (current) use of anticoagulants: Secondary | ICD-10-CM

## 2012-05-29 DIAGNOSIS — S72001S Fracture of unspecified part of neck of right femur, sequela: Secondary | ICD-10-CM

## 2012-05-30 ENCOUNTER — Encounter: Payer: Self-pay | Admitting: Adult Health

## 2012-05-30 DIAGNOSIS — Z7901 Long term (current) use of anticoagulants: Secondary | ICD-10-CM | POA: Insufficient documentation

## 2012-05-30 NOTE — Progress Notes (Signed)
Patient ID: [name], female   DOB: 17-Apr-1930, 79 y.o.   MRN: [id number] Patient ID: [name], female   DOB: Sep 27, 1930, 79 y.o.   MRN: [id number] Subjective:     Indication: DVT Bleeding signs/symptoms: None Thromboembolic signs/symptoms: None  Missed Coumadin doses: None Medication changes: no Dietary changes: no Bacterial/viral infection: no Other concerns: no  The following portions of the patient's history were reviewed and updated as appropriate: allergies, current medications, past family history, past medical history, past social history, past surgical history and problem list.  Review of Systems A comprehensive review of systems was negative.   Objective:    INR Today: 3.8 Current dose: Coumadin 4 mg PO Q D  Assessment:    Supratherapeutic INR for goal of 2-3.5   Plan:    1. New dose: Hold Coumadin   2. Next INR:  05/29/12

## 2012-05-30 NOTE — Progress Notes (Signed)
  Subjective:    Patient ID: [name], female    DOB: 01-25-1931, 79 y.o.   MRN: [id number]  HPI This is an 79 year old female who is being seen for Coumadin therapy. Subjective:     Indication: DVT Bleeding signs/symptoms: None Thromboembolic signs/symptoms: None  Missed Coumadin doses: None Medication changes: no Dietary changes: no Bacterial/viral infection: no Other concerns: no  The following portions of the patient's history were reviewed and updated as appropriate: allergies, current medications, past family history, past medical history, past social history, past surgical history and problem list.  Review of Systems A comprehensive review of systems was negative.   Objective:    INR Today: 1.4 Current dose:  Coumadin 3 mg PO Q D  Assessment:    Subtherapeutic INR for goal of 2-3.5   Plan:    1. New dose: Coumadin 4.5 mg PO Q D   2. Next INR:  05/21/12   Review of Systems See above    Objective:   Physical Exam See above       Assessment & Plan:  See above

## 2012-05-30 NOTE — Progress Notes (Signed)
Patient ID: [name], female   DOB: 07-21-1930, 79 y.o.   MRN: [id number]   Subjective:     Indication: DVT Bleeding signs/symptoms: None Thromboembolic signs/symptoms: None  Missed Coumadin doses: None Medication changes: no Dietary changes: no Bacterial/viral infection: no Other concerns: no  The following portions of the patient's history were reviewed and updated as appropriate: allergies, current medications, past family history, past medical history, past social history, past surgical history and problem list.  Review of Systems A comprehensive review of systems was negative.   Objective:    INR Today: 3.7 Current dose: on hold x 1 day  Assessment:    Supratherapeutic INR for goal of 2-3.5   Plan:    1. New dose: Hold Coumadin  X 2 days 2. Next INR:  05/31/12

## 2012-05-30 NOTE — Progress Notes (Signed)
  Subjective:    Patient ID: [name], female    DOB: 1930/06/23, 79 y.o.   MRN: [id number]  HPI This is an 79 year old female who complained of chest pain over the weekend. Niece reports that patient becomes anxious at times and might have contributed to the chest pain. She is currently taking Glucophage for diabetes and patient thinks she is not diabetic. No complaints of polyuria, polyphagia and polydipsia.    Review of Systems  Constitutional: Negative.   HENT: Negative.   Respiratory: Negative for shortness of breath and wheezing.   Cardiovascular: Positive for chest pain. Negative for palpitations and leg swelling.  Gastrointestinal: Negative for vomiting, constipation and abdominal distention.  Endocrine: Negative for polydipsia, polyphagia and polyuria.  Genitourinary: Negative.   Hematological: Negative for adenopathy. Does not bruise/bleed easily.  Psychiatric/Behavioral: The patient is nervous/anxious.        Objective:   Physical Exam  Constitutional: She is oriented to person, place, and time. She appears well-developed and well-nourished.  HENT:  Head: Normocephalic.  Right Ear: External ear normal.  Left Ear: External ear normal.  Eyes: Conjunctivae are normal. Pupils are equal, round, and reactive to light.  Neck: Normal range of motion. No thyromegaly present.  Cardiovascular: Normal rate, regular rhythm and normal heart sounds.   Pulmonary/Chest: Effort normal and breath sounds normal.  Abdominal: Soft. Bowel sounds are normal.  Musculoskeletal: She exhibits no edema and no tenderness.  Neurological: She is alert and oriented to person, place, and time.  Skin: Skin is warm and dry.  Psychiatric: Her behavior is normal. Judgment and thought content normal.  anxious    LABS:  3/14  Wbc 4.7  hgb 11.5  hct 34.3  NA 134  K 4.1  Glucose 101  BUN 6  Creatinine 0.48       Assessment & Plan:  Diabetes Mellitus - check hgbA1c next lab draw  Anxiety - start  Ativan 0.5 mg 1 tab PO Q AM  Chest Pain - Cardiology consult

## 2012-05-30 NOTE — Progress Notes (Signed)
Patient ID: [name], female   DOB: 03/23/1930, 79 y.o.   MRN: [id number] Subjective:     Indication: DVT Bleeding signs/symptoms: None Thromboembolic signs/symptoms: None  Missed Coumadin doses: None Medication changes: no Dietary changes: no Bacterial/viral infection: no Other concerns: no  The following portions of the patient's history were reviewed and updated as appropriate: allergies, current medications, past family history, past medical history, past social history, past surgical history and problem list.  Review of Systems A comprehensive review of systems was negative.   Objective:    INR Today: 3.8 Current dose: Coumadin 4.5 mg PO Q D  Assessment:    Supratherapeutic INR for goal of 2-3.5   Plan:    1. New dose: Hold Coumadin   2. Next INR:  05/25/12

## 2012-05-31 ENCOUNTER — Non-Acute Institutional Stay (SKILLED_NURSING_FACILITY): Payer: Medicare Other | Admitting: Adult Health

## 2012-05-31 DIAGNOSIS — S72009D Fracture of unspecified part of neck of unspecified femur, subsequent encounter for closed fracture with routine healing: Secondary | ICD-10-CM

## 2012-05-31 DIAGNOSIS — Z7901 Long term (current) use of anticoagulants: Secondary | ICD-10-CM

## 2012-05-31 DIAGNOSIS — S72001D Fracture of unspecified part of neck of right femur, subsequent encounter for closed fracture with routine healing: Secondary | ICD-10-CM

## 2012-06-04 ENCOUNTER — Non-Acute Institutional Stay (SKILLED_NURSING_FACILITY): Payer: Medicare Other | Admitting: Adult Health

## 2012-06-04 DIAGNOSIS — J189 Pneumonia, unspecified organism: Secondary | ICD-10-CM

## 2012-06-04 DIAGNOSIS — Z7901 Long term (current) use of anticoagulants: Secondary | ICD-10-CM

## 2012-06-04 DIAGNOSIS — S72001D Fracture of unspecified part of neck of right femur, subsequent encounter for closed fracture with routine healing: Secondary | ICD-10-CM

## 2012-06-04 DIAGNOSIS — S72009D Fracture of unspecified part of neck of unspecified femur, subsequent encounter for closed fracture with routine healing: Secondary | ICD-10-CM

## 2012-06-06 ENCOUNTER — Ambulatory Visit (HOSPITAL_COMMUNITY)
Admission: RE | Admit: 2012-06-06 | Discharge: 2012-06-06 | Disposition: A | Discharge status: Home / Self Care | Payer: Medicare Other | Source: Ambulatory Visit | Attending: Cardiology | Admitting: Cardiology

## 2012-06-06 ENCOUNTER — Ambulatory Visit (HOSPITAL_COMMUNITY)
Admission: RE | Admit: 2012-06-06 | Discharge: 2012-06-06 | Disposition: A | Discharge status: Home / Self Care | Payer: Medicare Other | Source: Ambulatory Visit | Attending: Physician Assistant | Admitting: Physician Assistant

## 2012-06-06 DIAGNOSIS — R0602 Shortness of breath: Secondary | ICD-10-CM

## 2012-06-06 DIAGNOSIS — R079 Chest pain, unspecified: Secondary | ICD-10-CM

## 2012-06-06 DIAGNOSIS — I10 Essential (primary) hypertension: Secondary | ICD-10-CM

## 2012-06-06 HISTORY — PX: DOPPLER ECHOCARDIOGRAPHY: SHX263

## 2012-06-06 MED ORDER — TECHNETIUM TC 99M SESTAMIBI GENERIC - CARDIOLITE
30.7000 | Freq: Once | INTRAVENOUS | Status: AC | PRN
  Administered 2012-06-06: 30.7 via INTRAVENOUS

## 2012-06-06 MED ORDER — AMINOPHYLLINE 25 MG/ML IV SOLN
75.0000 mg | Freq: Once | INTRAVENOUS | Status: AC
  Administered 2012-06-06: 75 mg via INTRAVENOUS

## 2012-06-06 MED ORDER — TECHNETIUM TC 99M SESTAMIBI GENERIC - CARDIOLITE
10.4000 | Freq: Once | INTRAVENOUS | Status: AC | PRN
  Administered 2012-06-06: 10 via INTRAVENOUS

## 2012-06-06 MED ORDER — REGADENOSON 0.4 MG/5ML IV SOLN
0.4000 mg | Freq: Once | INTRAVENOUS | Status: AC
  Administered 2012-06-06: 0.4 mg via INTRAVENOUS

## 2012-06-06 NOTE — Procedures (Addendum)
[facility] [address] [place] [address] [phone]  Cardiology Nuclear Med Study  [name] is a 79 y.o. female     MRN : [id number]     DOB: [birth year]  Procedure Date: 06/06/2012  Nuclear Med Background Indication for Stress Test:  Stent Patency History:  STENT/PTCA--2004 Cardiac Risk Factors: Family History - CAD, History of Smoking, Hypertension, Lipids, NIDDM and PVD  Symptoms:  Chest Pain, Dizziness, Fatigue, Light-Headedness and SOB   Nuclear Pre-Procedure Caffeine/Decaff Intake:  1:00am NPO After: 11 AM   IV Site: R Forearm  IV 0.9% NS with Angio Cath:  22g  Chest Size (in):  N/A IV Started by: Emmit Pomfret, RN  Height: 5\' 3"  (1.6 m)  Cup Size: B  BMI:  Body mass index is 20.91 kg/(m^2). Weight:  118 lb (53.524 kg)   Tech Comments:  N/A    Nuclear Med Study 1 or 2 day study: 1 day  Stress Test Type:  Lexiscan  Order Authorizing Provider:  DAVID HARDING,MD   Resting Radionuclide: Technetium 28m Sestamibi  Resting Radionuclide Dose: 10.4 mCi   Stress Radionuclide:  Technetium 10m Sestamibi  Stress Radionuclide Dose: 30.7 mCi           Stress Protocol Rest HR: 75 Stress HR: 91  Rest BP: 158/78 Stress BP: 125/64  Exercise Time (min): n/a METS: n/a          Dose of Adenosine (mg):  n/a Dose of Lexiscan: 0.4 mg  Dose of Atropine (mg): n/a Dose of Dobutamine: n/a mcg/kg/min (at max HR)  Stress Test Technologist: Ernestene Mention, CCT Nuclear Technologist: Gonzella Lex, CNMT   Rest Procedure:  Myocardial perfusion imaging was performed at rest 45 minutes following the intravenous administration of Technetium 54m Sestamibi. Stress Procedure:  The patient received IV Lexiscan 0.4 mg over 15-seconds.  Technetium 78m Sestamibi injected at 30-seconds.  Due to patient's shortness of breath, she was given IV Aminophylline 75 mg. Symptom was resolved.There were no significant changes with Lexiscan.   Quantitative spect images were obtained after a 45 minute delay.  Transient Ischemic Dilatation (Normal <1.22):  1.0 Lung/Heart Ratio (Normal <0.45):  0.29 QGS EDV:  80 ml QGS ESV:  42 ml LV Ejection Fraction: 47%  Rest ECG: NSR - Normal EKG  Stress ECG: No significant change from baseline ECG  QPS Raw Data Images:  There is interference from nuclear activity from structures below the diaphragm. This does not affect the ability to read the study. Stress Images:  There is decreased uptake in the inferior wall. Rest Images:  There is decreased uptake in the inferior wall. Subtraction (SDS):  No evidence of ischemia.  Impression Exercise Capacity:  Lexiscan with no exercise. BP Response:  Hypotensive blood pressure response. Clinical Symptoms:  No significant symptoms noted. ECG Impression:  No significant ST segment change suggestive of ischemia. Comparison with Prior Nuclear Study: No changes from 2011 study which showed inferior bowel attenuation.  Overall Impression:  Low risk stress nuclear study. Significant inferior bowel attenuation artifact. No ischemia.  LV Wall Motion:  NL LV Function; NL Wall Motion; EF 47%, Basal to mid inferior hypokinesis (may be due to being count poor - ie, very little radiotracer uptake to be traced by the software, therefore, EF may not be reduced - consider echo for more accurate measure of LV function)  Chrystie Nose, MD, [facility] Board Certified in Nuclear Cardiology Attending Cardiologist [facility]  Chrystie Nose, MD  06/06/2012 5:10 PM

## 2012-06-06 NOTE — Progress Notes (Signed)
[facility]   2D echo completed 06/06/2012.   Cindy Emmerson Shuffield, RDCS  

## 2012-06-18 ENCOUNTER — Non-Acute Institutional Stay (SKILLED_NURSING_FACILITY): Payer: Medicare Other | Admitting: Adult Health

## 2012-06-18 DIAGNOSIS — Z7901 Long term (current) use of anticoagulants: Secondary | ICD-10-CM

## 2012-06-18 DIAGNOSIS — S72009D Fracture of unspecified part of neck of unspecified femur, subsequent encounter for closed fracture with routine healing: Secondary | ICD-10-CM

## 2012-06-18 DIAGNOSIS — S72001D Fracture of unspecified part of neck of right femur, subsequent encounter for closed fracture with routine healing: Secondary | ICD-10-CM

## 2012-06-21 ENCOUNTER — Non-Acute Institutional Stay (SKILLED_NURSING_FACILITY): Payer: Medicare Other | Admitting: Adult Health

## 2012-06-21 DIAGNOSIS — S72001D Fracture of unspecified part of neck of right femur, subsequent encounter for closed fracture with routine healing: Secondary | ICD-10-CM

## 2012-06-21 DIAGNOSIS — Z7901 Long term (current) use of anticoagulants: Secondary | ICD-10-CM

## 2012-06-21 DIAGNOSIS — S72009D Fracture of unspecified part of neck of unspecified femur, subsequent encounter for closed fracture with routine healing: Secondary | ICD-10-CM

## 2012-06-25 ENCOUNTER — Non-Acute Institutional Stay (SKILLED_NURSING_FACILITY): Payer: Medicare Other | Admitting: Adult Health

## 2012-06-25 DIAGNOSIS — Z7901 Long term (current) use of anticoagulants: Secondary | ICD-10-CM

## 2012-06-25 DIAGNOSIS — S72001D Fracture of unspecified part of neck of right femur, subsequent encounter for closed fracture with routine healing: Secondary | ICD-10-CM

## 2012-06-25 DIAGNOSIS — S72009D Fracture of unspecified part of neck of unspecified femur, subsequent encounter for closed fracture with routine healing: Secondary | ICD-10-CM

## 2012-07-02 ENCOUNTER — Encounter: Payer: Self-pay | Admitting: Adult Health

## 2012-07-02 ENCOUNTER — Non-Acute Institutional Stay (SKILLED_NURSING_FACILITY): Payer: Medicare Other | Admitting: Adult Health

## 2012-07-02 DIAGNOSIS — Z7901 Long term (current) use of anticoagulants: Secondary | ICD-10-CM

## 2012-07-02 DIAGNOSIS — K219 Gastro-esophageal reflux disease without esophagitis: Secondary | ICD-10-CM

## 2012-07-02 DIAGNOSIS — E118 Type 2 diabetes mellitus with unspecified complications: Secondary | ICD-10-CM

## 2012-07-02 DIAGNOSIS — S72001D Fracture of unspecified part of neck of right femur, subsequent encounter for closed fracture with routine healing: Secondary | ICD-10-CM

## 2012-07-02 DIAGNOSIS — M069 Rheumatoid arthritis, unspecified: Secondary | ICD-10-CM

## 2012-07-02 DIAGNOSIS — K5900 Constipation, unspecified: Secondary | ICD-10-CM | POA: Insufficient documentation

## 2012-07-02 DIAGNOSIS — I10 Essential (primary) hypertension: Secondary | ICD-10-CM

## 2012-07-02 DIAGNOSIS — S72009D Fracture of unspecified part of neck of unspecified femur, subsequent encounter for closed fracture with routine healing: Secondary | ICD-10-CM

## 2012-07-02 DIAGNOSIS — F411 Generalized anxiety disorder: Secondary | ICD-10-CM

## 2012-07-02 NOTE — Progress Notes (Signed)
  Subjective:    Patient ID: [name], female    DOB: [birth year], 79 y.o.   MRN: [id number]  HPI This is an 79 year old female who complained of productive cough with yellowish phlegm. Chest x-ray shows pneumonitis. Latest INR 2.2 - therapeutic. No complaints of shortness of breath nor chest pain. She is on Coumadin therapy for DVT prophylaxis S/P intramedullary nail of right hip fracture. She has been admitted to [facility] for a short-term rehabilitation.   Review of Systems  Constitutional: Negative.   HENT: Negative.   Eyes: Negative.   Respiratory: Positive for cough. Negative for chest tightness and shortness of breath.   Cardiovascular: Negative for chest pain and leg swelling.  Gastrointestinal: Negative.   Endocrine: Negative.   Genitourinary: Negative.   Neurological: Negative.   Hematological: Negative for adenopathy. Does not bruise/bleed easily.  Psychiatric/Behavioral: Negative.        Objective:   Physical Exam  Constitutional: She is oriented to person, place, and time. She appears well-developed and well-nourished.  HENT:  Head: Normocephalic and atraumatic.  Right Ear: External ear normal.  Left Ear: External ear normal.  Eyes: Conjunctivae are normal. Pupils are equal, round, and reactive to light.  Neck: Normal range of motion. Neck supple.  Cardiovascular: Normal rate, regular rhythm and normal heart sounds.   Pulmonary/Chest: Effort normal and breath sounds normal. No respiratory distress. She has no wheezes. She has no rales. She exhibits no tenderness.  Abdominal: Soft. Bowel sounds are normal.  Musculoskeletal: Normal range of motion. She exhibits no edema and no tenderness.  Neurological: She is alert and oriented to person, place, and time.  Skin: Skin is warm and dry.  Psychiatric: She has a normal mood and affect. Her behavior is normal. Judgment and thought content normal.   LABS: 05/09/12  Wbc 4.7  hgb 11.5  hct 34.3 04/24/12  Wbc 5.9   Hgb10.0  hct 30.4  NA 134  K 4.1  Glucose 101  BUN 6  Creatinine 0.48  Medications reviewed per [facility]     Assessment & Plan:  Right hip fracture S/P IM nail - continue PT/OT Long term use of anticoagulant - continue Coumadin 3 mg PO Q D and repeat INR on 06/11/12; INR Q Tuesdays and Thursdays while on Levaquin Pneumonitis  -  Start Levaquin 750 mg PO Q D x 7 days

## 2012-07-02 NOTE — Progress Notes (Signed)
Patient ID: [name], female   DOB: 1930-05-22, 79 y.o.   MRN: [id number]  Subjective:     Indication: DVT prophylaxis Bleeding signs/symptoms: None Thromboembolic signs/symptoms: None Missed Coumadin doses: None Medication changes: no Dietary changes: no Bacterial/viral infection: no Other concerns: no     Review of Systems A comprehensive review of systems was negative.   Objective:    INR Today: 1.7 Current dose: Coumadin 2 mg PO Q D  Assessment:   subtherapeutic INR for goal of 2-3   Plan:    1. New dose: start Coumadin 2.5 mg PO Q D    2. Next INR: 06/21/12

## 2012-07-02 NOTE — Progress Notes (Signed)
Patient ID: [name], female   DOB: May 10, 1930, 79 y.o.   MRN: [id number] Subjective:     Indication: DVT prophylaxis Bleeding signs/symptoms: None Thromboembolic signs/symptoms: None  Missed Coumadin doses: 3 days due to supratherapeutic INR Medication changes: no Dietary changes: no Bacterial/viral infection: no Other concerns: no     Review of Systems A comprehensive review of systems was negative.   Objective:    INR Today: 1.8 Current dose: held x 3 days due to supratherapeutic INR  Assessment:    Subtherapeutic INR for goal of 2-3   Plan:    1. New dose: start Coumadin 3 mg PO Q D    2. Next INR: 06/04/12

## 2012-07-02 NOTE — Progress Notes (Signed)
Patient ID: [name], female   DOB: 06-03-1930, 79 y.o.   MRN: [id number]  Subjective:     Indication: DVT prophylaxis Bleeding signs/symptoms: None Thromboembolic signs/symptoms: None Missed Coumadin doses: None Medication changes: no Dietary changes: no Bacterial/viral infection: no Other concerns: no     Review of Systems A comprehensive review of systems was negative.   Objective:    INR Today: 1.9 Current dose: Coumadin 2.5 mg PO Q D  Assessment:   subtherapeutic INR for goal of 2-3   Plan:    1. New dose: continue Coumadin 2.5 mg PO Q D    2. Next INR: 06/25/12

## 2012-07-02 NOTE — Progress Notes (Signed)
Patient ID: [name], female   DOB: 1930/09/29, 79 y.o.   MRN: [id number]  Subjective:     Indication: DVT prophylaxis Bleeding signs/symptoms: None Thromboembolic signs/symptoms: None Missed Coumadin doses: None Medication changes: no Dietary changes: no Bacterial/viral infection: no Other concerns: no     Review of Systems A comprehensive review of systems was negative.   Objective:    INR Today: 2.0 Current dose: Coumadin 2.5 mg PO Q D  Assessment:   therapeutic INR for goal of 2-3   Plan:    1. New dose: continue Coumadin 2.5 mg PO Q D    2. Next INR: 07/02/12

## 2012-07-02 NOTE — Progress Notes (Signed)
  Subjective:    Patient ID: [name], female    DOB: 1930-07-10, 79 y.o.   MRN: [id number]  HPI This is an 79 year old female who is for discharge home with Home health PT, OT, Nurse and Aide. DME: Rollator due to unsteady gait. She has been admitted to [facility] on 04/16/12 from [facility] with right hip fracture S/P IM nailing. She has been admitted for a short-term rehabilitation. Today's INR 1.6  subtherapeutic. No complaints of chest pain nor shortness of breath. She is on Coumadin therapy for DVT prophylaxis S/P surgery.   Review of Systems  Constitutional: Negative.   HENT: Negative.   Eyes: Negative.   Respiratory: Negative for cough and shortness of breath.   Cardiovascular: Negative for leg swelling.  Gastrointestinal: Negative.   Endocrine: Negative.   Genitourinary: Negative.   Neurological: Negative.   Hematological: Negative for adenopathy. Does not bruise/bleed easily.  Psychiatric/Behavioral: Negative.        Objective:   Physical Exam  Nursing note and vitals reviewed. Constitutional: She is oriented to person, place, and time. She appears well-developed and well-nourished.  HENT:  Head: Normocephalic and atraumatic.  Right Ear: External ear normal.  Left Ear: External ear normal.  Eyes: Conjunctivae and EOM are normal. Pupils are equal, round, and reactive to light.  Neck: Normal range of motion. Neck supple. No thyromegaly present.  Cardiovascular: Normal rate, regular rhythm and normal heart sounds.   Pulmonary/Chest: Effort normal and breath sounds normal. No respiratory distress.  Abdominal: Soft. Bowel sounds are normal.  Musculoskeletal: Normal range of motion. She exhibits no edema and no tenderness.  Neurological: She is alert and oriented to person, place, and time.  Skin: Skin is warm and dry.  Psychiatric: She has a normal mood and affect. Her behavior is normal. Judgment and thought content normal.   LABS: 06/20/12  Cbc nl  Bmp nl  except glucose 117 05/23/12  hgbA1c 5.8 05/09/12  Wbc 4.7  hgb 11.5  hct 34.3  Medications reviewed per [facility]     Assessment & Plan:  Generalized anxiety disorder - stable  Unspecified constipation - no complaints  Long term (current) use of anticoagulants - increase Coumadin to 3 mg PO Q D and repeat INR on 07/05/12  DM (diabetes mellitus), type 2 -  Diet controlled  HTN (hypertension) - well-controlled   Closed right hip fracture S/P IM nailing - for Home health PT, OT, Nurse and Aide  Rheumatoid arthritis - stable  GERD (gastroesophageal reflux disease) - stable

## 2012-07-04 DIAGNOSIS — Z5181 Encounter for therapeutic drug level monitoring: Secondary | ICD-10-CM

## 2012-07-04 DIAGNOSIS — Z7901 Long term (current) use of anticoagulants: Secondary | ICD-10-CM

## 2012-07-04 DIAGNOSIS — M069 Rheumatoid arthritis, unspecified: Secondary | ICD-10-CM

## 2012-07-04 DIAGNOSIS — S72009D Fracture of unspecified part of neck of unspecified femur, subsequent encounter for closed fracture with routine healing: Secondary | ICD-10-CM

## 2012-07-23 ENCOUNTER — Other Ambulatory Visit: Payer: Self-pay | Admitting: Adult Health

## 2012-07-24 ENCOUNTER — Other Ambulatory Visit: Payer: Self-pay | Admitting: *Deleted

## 2012-07-24 MED ORDER — ISOSORBIDE MONONITRATE ER 30 MG PO TB24: 30.0000 mg | ORAL_TABLET | Freq: Every day | ORAL | Status: DC

## 2012-07-24 NOTE — Telephone Encounter (Signed)
Sent refill

## 2012-08-16 HISTORY — PX: OTHER SURGICAL HISTORY: SHX169

## 2012-08-19 ENCOUNTER — Encounter: Payer: Self-pay | Admitting: Internal Medicine

## 2012-08-19 ENCOUNTER — Encounter (INDEPENDENT_AMBULATORY_CARE_PROVIDER_SITE_OTHER): Payer: Medicare Other | Admitting: *Deleted

## 2012-08-19 DIAGNOSIS — I82409 Acute embolism and thrombosis of unspecified deep veins of unspecified lower extremity: Secondary | ICD-10-CM

## 2012-09-11 ENCOUNTER — Ambulatory Visit (INDEPENDENT_AMBULATORY_CARE_PROVIDER_SITE_OTHER): Payer: Medicare Other | Admitting: Cardiology

## 2012-09-11 ENCOUNTER — Encounter: Payer: Self-pay | Admitting: Cardiology

## 2012-09-11 VITALS — BP 122/62 | HR 73 | Ht 61.0 in | Wt 116.8 lb

## 2012-09-11 DIAGNOSIS — I10 Essential (primary) hypertension: Secondary | ICD-10-CM

## 2012-09-11 DIAGNOSIS — I2510 Atherosclerotic heart disease of native coronary artery without angina pectoris: Secondary | ICD-10-CM

## 2012-09-11 DIAGNOSIS — I82511 Chronic embolism and thrombosis of right femoral vein: Secondary | ICD-10-CM

## 2012-09-11 DIAGNOSIS — I739 Peripheral vascular disease, unspecified: Secondary | ICD-10-CM

## 2012-09-11 DIAGNOSIS — I825Y9 Chronic embolism and thrombosis of unspecified deep veins of unspecified proximal lower extremity: Secondary | ICD-10-CM

## 2012-09-11 NOTE — Patient Instructions (Addendum)
STOP IMDUR .  Your physician recommends that you schedule a follow-up appointment iN 2-3 MONTHS

## 2012-09-12 ENCOUNTER — Encounter: Payer: Self-pay | Admitting: Cardiology

## 2012-09-25 ENCOUNTER — Ambulatory Visit: Payer: Medicare Other | Admitting: Cardiology

## 2012-09-26 ENCOUNTER — Encounter: Payer: Self-pay | Admitting: Cardiology

## 2012-09-26 DIAGNOSIS — I739 Peripheral vascular disease, unspecified: Secondary | ICD-10-CM | POA: Insufficient documentation

## 2012-09-26 DIAGNOSIS — I82519 Chronic embolism and thrombosis of unspecified femoral vein: Secondary | ICD-10-CM | POA: Insufficient documentation

## 2012-09-26 NOTE — Assessment & Plan Note (Signed)
Continue on warfarin.  Will defer timing/duration of treatment to her primary physician.

## 2012-09-26 NOTE — Assessment & Plan Note (Addendum)
This is relatively stable at this point.  No active anginal symptoms, recent stress test negative.  It appears that they're trying to cut down from her polypharmacy.  I'm finally taking the metoprolol an as-needed basis.  She is not taking aspirin, because of being on warfarin.  She is probably far enough out from her stents that this is acceptable.  With her not having active symptoms, I think we can discontinue the Imdur.  She is statin intolerant.

## 2012-09-26 NOTE — Progress Notes (Signed)
Patient ID: [name], female   DOB: 1930/07/20, 79 y.o.   MRN: [id number] PCP: Hoyle Sauer, MD  Clinic Note: Chief Complaint  Patient presents with  . 3 month visit    U/S  DONE  recently has blood clot in both legs  per Dr Felipa Eth chronic clots, pain with left shoulder,no chest pain,no sob (sighs a lot) ,, edema   HPI: [name] is a 79 y.o. female with a PMH below who presents today for followup after seeing her primary physician for what appears to be chronic right lower extremity DVT. She has had a relatively difficult last 6-8 months with hip surgery followed by fall with closed of fracture the right-sided ORIF.  She had been at [facility] for rehabilitation.  She seemed to do well at the rehabilitation, the social situation at the rehabilitation center was not to her liking.  I last saw her on May 6, at that time she was having some chest discomfort that was thought to be nonanginal based on negative Myoview stress test.  She has had labile blood pressures, I made some suggestions, however she is not taking Norvasc anymore, and is only taking metoprolol when necessary.  She is moved back home, where she is cared for by her granddaughter, is much Year, with much less social stress.  Interval History: Today she is much happier being home.  She really doesn't have any major complaints.  She denies any further chest, and states that she is no more short of breath than she usually is.  Her biggest complaint is that her legs give out on her all time.  She has mild edema to her, and she has some mild varicose veins are somewhat tender.  She barely saw Dr. Felipa Eth recently and underwent venous Dopplers that demonstrated right-sided DVT in the femoral and peroneal veins.  This was thought to be chronic, and she is on warfarin followed by [name], Pharm.D. at [facility].  She otherwise is doing relatively well.  No further chest discomfort as noted.  No PND or  orthopnea.  Slight balance issues with her rehabilitation, but no dizziness or wooziness.  No significant near-syncope.  No TIA or amaurosis fugax symptoms.  No melena, hematochezia or hematuria.  Past Medical History  Diagnosis Date  . History of  Angina     Treated with PCI in 2004; no active Symptoms.  . Pneumonia 12/12/10  . Hiatal hernia   . Osteoarthritis of both hips      status post multiple hip operations, recent fall with fracture.    [name] GERD (gastroesophageal reflux disease)   . COPD (chronic obstructive pulmonary disease)     pt. denies having this  . Iron deficiency anemia   . Barrett esophagus   . HTN (hypertension)   . DJD (degenerative joint disease)   . Osteoporosis   . HLD (hyperlipidemia)     Statin intolerant  . Internal hemorrhoids   . Diverticulosis   . Shortness of breath     MILD- WITH EXERTION  . Pain     RIGHT SHOULDER WITH LIMITED ROM--STATES PREVIOUS RT TOTAL SHOULDDER REPLACEMENT-BUT STILL HAS PROBLEMS WITH SHOULDER  . CAD S/P percutaneous coronary angioplasty 2004    PCI-RCA (2.31mm x 12mm Taxus DES)& Cx (2.75 mm x 14 mm Cypher DES)  . PAD (peripheral artery disease) 1994    History of right iliac stenting, fem-fem bypass in 94; aortobifem bypass/ AAA repair - November 2004; latest  ABIs of been roughly 1 bilaterally, followed by Dr. Arbie Cookey   Prior Cardiac Evaluation and Past Surgical History: Past Surgical History  Procedure Laterality Date  . Back surgery      x 2  . Appendectomy      79yrs old  . Total shoulder replacement      right  . Cholecystectomy      several yrs ago  . Cataract extraction  2001, 2002    bilateral  . Total abdominal hysterectomy  1965  . Upper gastrointestinal endoscopy  multiple    w/bx, hiatal hernia, Barretts', esophageal polyp 1x  . Colonoscopy  05/09/2004; 06/08/1999    internal hemorrhoids; diverticulosis  . Rotator cuff repair      bilateral  . Lumbar spine surgery  October 2012    spinal stenosis, Dr. Ophelia Charter    . Abdominal aortic aneurysm repair  2004    AortoBi-Fem Bypass  . Coronary angioplasty with stent placement  2004    RCA: Taxus DES 2.5 mm x 12 mm; Circumflex: Cypher DES 2.75 mm 14 mm  . Left carpal tunnel release march 2013    . Total hip arthroplasty  01/26/2012    Procedure: TOTAL HIP ARTHROPLASTY ANTERIOR APPROACH;  Surgeon: Kathryne Hitch, MD;  Location: [facility] ORS;  Service: Orthopedics;  Laterality: Left;  Left Total Hip Arthroplasty, Anterior Approach (C-Arm)  . Intramedullary (im) nail intertrochanteric Right 04/12/2012    Procedure: INTRAMEDULLARY (IM) NAIL INTERTROCHANTRIC Right;  Surgeon: Kathryne Hitch, MD;  Location: [facility] OR;  Service: Orthopedics;  Laterality: Right;  . Doppler echocardiography  06/06/2012    EF 50-60%.  Mild LVH, grade one diastolic function; normal PA pressures and CVP  . Lower extremity venous dopplers  08/16/2012    Partially occlusive right femoral vein thrombosis, also noted in right peroneal vein; no thrombosis in left sided veins.  . Cardiac catheterization  October 2005    Patent RCA stent, 20% ISR of Cx stent; EF 50-55%  . Nm myoview ltd  April 2014    Bowel artifact, No ischemia or infarction  . Femoral-femoral bypass graft  1994    Following right iliac stenting  . Doppler echocardiography  06/06/2012    Mild LVH, EF 55-60%.  Normal wall motion.  Mildly elevated LVEDP with grade one diastolic function.  Mild LA dilatation.  Normal PA pressures.    Allergies  Allergen Reactions  . Codeine Nausea And Vomiting, Swelling and Rash  . Hydrocodone Nausea And Vomiting, Swelling and Rash  . Morphine And Related Other (See Comments)    hallucinations  . Other Nausea And Vomiting    Most pain meds other than tramadol. Tylenol, Toradol, and Robaxin are ok.  PT STATES SHE NEEDS PAPER TAPE--SKIN VERY FRAGILE  . Penicillins Hives  . Darvocet (Propoxyphene-Acetaminophen)   . Demerol (Meperidine)   . Statins     INTOLERANT OF STATINS     Current Outpatient Prescriptions  Medication Sig Dispense Refill  . acetaminophen (TYLENOL) 325 MG tablet Take 2 tablets (650 mg total) by mouth every 6 (six) hours as needed.      . Calcium Carbonate-Vitamin D (CALTRATE 600+D) 600-400 MG-UNIT per tablet Take 1 tablet by mouth daily.       . Cholecalciferol (VITAMIN D3) 2000 UNITS TABS Take 1 tablet by mouth daily.      [name] docusate sodium 100 MG CAPS Take 100 mg by mouth 2 (two) times daily.  10 capsule    . Flaxseed, Linseed, (FLAX SEED OIL  PO) Take 1 tablet by mouth daily. Hold while in hospital      . HYDROcodone-acetaminophen (NORCO) 5-325 MG per tablet Take 1 tablet by mouth every 6 (six) hours as needed for pain.  30 tablet  0  . hydroxychloroquine (PLAQUENIL) 200 MG tablet Take 200 mg by mouth daily.       . isosorbide mononitrate (IMDUR) 30 MG 24 hr tablet Take 1 tablet (30 mg total) by mouth daily.  30 tablet  11  . lactose free nutrition (BOOST) LIQD Take 237 mLs by mouth 3 (three) times daily between meals.      [name] LORazepam (ATIVAN) 0.5 MG tablet Take one tablet by mouth every morning for anxiety  30 tablet  5  . metoprolol tartrate (LOPRESSOR) 12.5 mg TABS Take 12.5 mg by mouth 2 (two) times daily. prn      . Multiple Vitamins-Minerals (CENTRUM SILVER PO) Take 1 tablet by mouth daily.      [name] omeprazole (PRILOSEC OTC) 20 MG tablet Take 20 mg by mouth daily.      . polyvinyl alcohol (LIQUIFILM TEARS) 1.4 % ophthalmic solution Place 1 drop into both eyes as needed (Dry eyes.).  15 mL    . promethazine (PHENERGAN) 12.5 MG tablet Take 12.5 mg by mouth every 6 (six) hours as needed. PT TAKES WITH TRAMADOL      . traMADol (ULTRAM) 50 MG tablet Take 1 tablet (50 mg total) by mouth every 6 (six) hours as needed for pain. Pain  30 tablet  0  . vitamin C (ASCORBIC ACID) 500 MG tablet Take 500 mg by mouth daily.      [name] warfarin (COUMADIN) 2.5 MG tablet Take 1 tablet (2.5 mg total) by mouth daily at 6 PM.       No current  facility-administered medications for this visit.    History   Social History  . Marital Status: Widowed    Spouse Name: N/A    Number of Children: N/A  . Years of Education: N/A   Occupational History  . retired    Social History Main Topics  . Smoking status: Former Smoker    Types: Cigarettes    Quit date: 04/20/2000  . Smokeless tobacco: Never Used  . Alcohol Use: No  . Drug Use: No  . Sexually Active: Not on file   Other Topics Concern  . Not on file   Social History Narrative    She is currently living back at home after a brief stay at [facility] for postoperative rehab.  Currently doing physical therapy.       She is widowed, and has no living children.  One of her granddaughters is with her.   She has 2 grandchildren and 1 great-grandchild. Does not smoke, does not drink.    ROS: A comprehensive Review of Systems - Negative except Pertinent positives noted above.  Her basic baseline shortness of breath.  She says her legs "give out on her a lot" General ROS: positive for  - fatigue negative for - chills, fever or sleep disturbance Psychological ROS: negative for - anxiety, depression, disorientation, hallucinations or Notably improved since returning home Musculoskeletal ROS: Her legs give out on her.  Mild discomfort on the right leg.  PHYSICAL EXAM BP 122/62  Pulse 73  Ht 5\' 1"  (1.549 m)  Wt 116 lb 12.8 oz (52.98 kg)  BMI 22.08 kg/m2 General: She is a pleasant elderly woman in no acute distress, A&O x3, answers questions appropriately. She  is sitting in a wheelchair just simply because that is how she has been brought in. She also has kyphosis but is relatively healthy appearing, just somewhat frail.  HEENT: NCAT, EOMI, MMM, and anicteric sclerae.  Neck: Supple; no LAN, JVD, or carotid bruit. Heart: RRR, normal S1, S2. No R/G but maybe a soft systolic click and a soft maybe 1/6 SEM along the sternal border.  Lungs: CTAB, nonlabored, normal effort, good air  movement.  Abdomen: Soft/NT/ND/NABS. No HSM. Extremities: No C/C with mild right-sided the edema that is nonpitting  [id number] today: Yes Rate: 73 , Rhythm: Normal sinus rhythm, normal ECG  Recent Labs: None  ASSESSMENT / PLAN:  CAD (coronary artery disease), native coronary artery - PCI to RCA & Circumflex 2004 This is relatively stable at this point.  No active anginal symptoms, recent stress test negative.  It appears that they're trying to cut down from her polypharmacy.  I'm finally taking the metoprolol an as-needed basis.  She is not taking aspirin, because of being on warfarin.  She is probably far enough out from her stents that this is acceptable.  With her not having active symptoms, I think we can discontinue the Imdur.  She is statin intolerant.  HTN (hypertension)  Her blood pressures under a labile, but the last few visits she has been having some hypotension noted as well.  She is no longer on Norvasc which is fine, and is taking the metoprolol on a when necessary basis.  I would be easily amenable to some mild permissive hypertension.  DVT, Right Femoral, "chronic" -  with extension to the peroneal Continue on warfarin.  Will defer timing/duration of treatment to her primary physician.  PAD (peripheral artery disease) Stable with relatively no claudication symptoms. Followup according to Dr. Arbie Cookey.   Meds ordered this encounter  Medications  . metoprolol tartrate (LOPRESSOR) 12.5 mg TABS    Sig: Take 12.5 mg by mouth 2 (two) times daily. prn    Followup: 3 month  DAVID W. Herbie Baltimore, M.D., M.S. [facility] [address] [place], [place]  [address]  [phone] Pager # [phone]

## 2012-09-26 NOTE — Assessment & Plan Note (Signed)
Stable with relatively no claudication symptoms. Followup according to Dr. Arbie Cookey.

## 2012-09-26 NOTE — Assessment & Plan Note (Signed)
Her blood pressures under a labile, but the last few visits she has been having some hypotension noted as well.  She is no longer on Norvasc which is fine, and is taking the metoprolol on a when necessary basis.  I would be easily amenable to some mild permissive hypertension.

## 2012-10-22 ENCOUNTER — Telehealth: Payer: Self-pay | Admitting: Cardiology

## 2012-10-22 MED ORDER — ISOSORBIDE MONONITRATE ER 30 MG PO TB24: 30.0000 mg | ORAL_TABLET | Freq: Every day | ORAL | Status: DC

## 2012-10-22 NOTE — Telephone Encounter (Signed)
[name] is calling her grandmother's medication that Dr. Herbie Baltimore took her off of. She is not feeling well for the last week and would like to know what to do. She has been having scattered chest pain and SOB. Has nitro but she feels that she needs to take it.

## 2012-10-22 NOTE — Telephone Encounter (Signed)
Saw PCP today, however chest discomfort since stopping the imdur. Bodyaches all over." Blood pressure has been all over."  She feels like the imdur  Helped her pain would like to restart the imdur. After consulting with Dr. Herbie Baltimore Imdur will be restarted. Sent in to patients pharmacy.

## 2012-10-24 ENCOUNTER — Telehealth: Payer: Self-pay | Admitting: Cardiology

## 2012-10-24 NOTE — Telephone Encounter (Signed)
Please call-wants to know if Dr Herbie Baltimore was going to put her back on her Imdur-if so was suppose to call it in to [facility].

## 2012-10-25 NOTE — Telephone Encounter (Signed)
Returned call.  Left generic message to call back before 4pm and that prescription was dc'd at last OV.

## 2012-10-29 ENCOUNTER — Other Ambulatory Visit: Payer: Self-pay | Admitting: Adult Health

## 2012-12-04 ENCOUNTER — Telehealth: Payer: Self-pay | Admitting: Cardiology

## 2012-12-12 ENCOUNTER — Encounter: Payer: Self-pay | Admitting: Cardiology

## 2012-12-12 ENCOUNTER — Ambulatory Visit (INDEPENDENT_AMBULATORY_CARE_PROVIDER_SITE_OTHER): Payer: Medicare Other | Admitting: Cardiology

## 2012-12-12 VITALS — BP 110/62 | HR 80 | Ht 61.0 in | Wt 116.4 lb

## 2012-12-12 DIAGNOSIS — I825Y9 Chronic embolism and thrombosis of unspecified deep veins of unspecified proximal lower extremity: Secondary | ICD-10-CM

## 2012-12-12 DIAGNOSIS — I714 Abdominal aortic aneurysm, without rupture, unspecified: Secondary | ICD-10-CM

## 2012-12-12 DIAGNOSIS — I82511 Chronic embolism and thrombosis of right femoral vein: Secondary | ICD-10-CM

## 2012-12-12 DIAGNOSIS — I70209 Unspecified atherosclerosis of native arteries of extremities, unspecified extremity: Secondary | ICD-10-CM

## 2012-12-12 DIAGNOSIS — E785 Hyperlipidemia, unspecified: Secondary | ICD-10-CM

## 2012-12-12 DIAGNOSIS — I2510 Atherosclerotic heart disease of native coronary artery without angina pectoris: Secondary | ICD-10-CM

## 2012-12-12 DIAGNOSIS — Z01810 Encounter for preprocedural cardiovascular examination: Secondary | ICD-10-CM

## 2012-12-12 NOTE — Patient Instructions (Signed)
You seem to be doing well back on the Imdur.    Based upon your recent Echocardiogram & Stress Test results, I think you should be a LOW RISK patient for LOW RISK  Surgery.  I wish you luck.  Unless things change, I think we can go back to yearly follow-up appointments.  Marykay Lex, MD

## 2012-12-12 NOTE — Progress Notes (Signed)
PATIENT: [name] MRN: [id number]  DOB: Jun 06, 1930   DOV:12/14/2012 PCP: Hoyle Sauer, MD  Clinic Note: Chief Complaint  Patient presents with  . ROV 3 months    Pt has to have surgery to readjust the anchor screw from her hip fracture. No chest pain since restarting the Imdur, shortness of breath "all the time," off and on ankle edema.   HPI: [name] is a 79 y.o. female with a PMH below who presents today for followup of her PAD and CAD. I last saw her in August. She had a Myoview test that was negative. She was ready to go home from rehabilitation. Her postop course from her hip surgeries or was collocated by lower extremity DVT in the right leg she is now a chronic warfarin since his persistent DVT. As usual she comes in with her granddaughter who is her primary caregiver. She had a nonischemic Myoview and a relatively normal echocardiogram performed recently was results noted below.    Interval History: She is doing quite well today. No real major complaints. Moderate facet have improved since she's adjusted her GI cocktail. She is now taking Linzess as well as probiotics. This has really helped those symptoms. She is essentially now only taking her amlodipine as needed. She is having less dizziness and lightheadedness. Her edema is improved.  The remainder of Cardiovascular ROS: no chest pain or dyspnea on exertion negative for - irregular heartbeat, loss of consciousness, orthopnea, palpitations, paroxysmal nocturnal dyspnea, rapid heart rate or shortness of breath: Additional cardiac review of systems: Lightheadedness - improved, dizziness - no, syncope/near-syncope - no; TIA/amaurosis fugax - no Melena - no, hematochezia no; hematuria - no; nosebleeds - no; claudication - no  Past Medical History  Diagnosis Date  . History of  Angina     Treated with PCI in 2004; no active Symptoms.  . Pneumonia 12/12/10  . Hiatal hernia   . Osteoarthritis of both hips    status post multiple hip operations, recent fall with fracture.    [name] GERD (gastroesophageal reflux disease)   . COPD (chronic obstructive pulmonary disease)     pt. denies having this  . Iron deficiency anemia   . Barrett esophagus   . HTN (hypertension)   . DJD (degenerative joint disease)   . Osteoporosis   . HLD (hyperlipidemia)     Statin intolerant  . Internal hemorrhoids   . Diverticulosis   . Shortness of breath     MILD- WITH EXERTION  . Pain     RIGHT SHOULDER WITH LIMITED ROM--STATES PREVIOUS RT TOTAL SHOULDDER REPLACEMENT-BUT STILL HAS PROBLEMS WITH SHOULDER  . CAD S/P percutaneous coronary angioplasty 2004    PCI-RCA (2.62mm x 12mm Taxus DES)& Cx (2.75 mm x 14 mm Cypher DES)  . PAD (peripheral artery disease) 1994    History of right iliac stenting, fem-fem bypass in 94; aortobifem bypass/ AAA repair - November 2004; latest ABIs of been roughly 1 bilaterally, followed by Dr. Arbie Cookey   Prior Cardiac / Vascular History: Past Surgical History  Procedure Laterality Date  . Abdominal aortic aneurysm repair  2004    AortoBi-Fem Bypass  . Coronary angioplasty with stent placement  2004    RCA: Taxus DES 2.5 mm x 12 mm; Circumflex: Cypher DES 2.75 mm 14 mm  . Doppler echocardiography  06/06/2012    EF 50-60%.  Mild LVH, grade one diastolic function; normal PA pressures and CVP  . Lower extremity venous dopplers  08/16/2012  Partially occlusive right femoral vein thrombosis, also noted in right peroneal vein; no thrombosis in left sided veins.  . Cardiac catheterization  October 2005    Patent RCA stent, 20% ISR of Cx stent; EF 50-55%  . Nm myoview ltd  April 2014    Bowel artifact, No ischemia or infarction  . Femoral-femoral bypass graft  1994    Following right iliac stenting  . Doppler echocardiography  06/06/2012    Mild LVH, EF 55-60%.  Normal wall motion.  Mildly elevated LVEDP with grade one diastolic function.  Mild LA dilatation.  Normal PA pressures.     Allergies  Allergen Reactions  . Codeine Nausea And Vomiting, Swelling and Rash  . Hydrocodone Nausea And Vomiting, Swelling and Rash  . Morphine And Related Other (See Comments)    hallucinations  . Other Nausea And Vomiting    Most pain meds other than tramadol. Tylenol, Toradol, and Robaxin are ok.  PT STATES SHE NEEDS PAPER TAPE--SKIN VERY FRAGILE  . Penicillins Hives  . Darvocet [Propoxyphene-Acetaminophen]   . Demerol [Meperidine]   . Statins     INTOLERANT OF STATINS    Current Outpatient Prescriptions  Medication Sig Dispense Refill  . acetaminophen (TYLENOL) 325 MG tablet Take 2 tablets (650 mg total) by mouth every 6 (six) hours as needed.      [name] amLODipine (NORVASC) 5 MG tablet Take 5 mg by mouth as needed.      . bisacodyl (DULCOLAX) 5 MG EC tablet Take 5 mg by mouth as needed for constipation.      . Calcium Carbonate-Vitamin D (CALTRATE 600+D) 600-400 MG-UNIT per tablet Take 1 tablet by mouth daily.       . Cholecalciferol (VITAMIN D3) 2000 UNITS TABS Take 1 tablet by mouth daily.      [name] docusate sodium 100 MG CAPS Take 100 mg by mouth 2 (two) times daily.  10 capsule    . escitalopram (LEXAPRO) 5 MG tablet Take 5 mg by mouth daily.      . Flaxseed, Linseed, (FLAX SEED OIL PO) Take 1 tablet by mouth daily. Hold while in hospital      . hydroxychloroquine (PLAQUENIL) 200 MG tablet Take 200 mg by mouth daily.       . isosorbide mononitrate (IMDUR) 30 MG 24 hr tablet Take 1 tablet (30 mg total) by mouth daily.  30 tablet  11  . lactose free nutrition (BOOST) LIQD Take 237 mLs by mouth 3 (three) times daily between meals.      . Linaclotide (LINZESS) 145 MCG CAPS capsule Take 145 mcg by mouth daily.      . methocarbamol (ROBAXIN) 500 MG tablet Take 500 mg by mouth as needed.      . Multiple Vitamins-Minerals (CENTRUM SILVER PO) Take 1 tablet by mouth daily.      [name] omeprazole (PRILOSEC OTC) 20 MG tablet Take 20 mg by mouth daily.      . polyvinyl alcohol (LIQUIFILM  TEARS) 1.4 % ophthalmic solution Place 1 drop into both eyes as needed (Dry eyes.).  15 mL    . promethazine (PHENERGAN) 12.5 MG tablet Take 12.5 mg by mouth every 6 (six) hours as needed. PT TAKES WITH TRAMADOL      . traMADol (ULTRAM) 50 MG tablet Take 1 tablet (50 mg total) by mouth every 6 (six) hours as needed for pain. Pain  30 tablet  0  . vitamin C (ASCORBIC ACID) 500 MG tablet Take 500 mg by mouth  daily.      . warfarin (COUMADIN) 3 MG tablet Take 3 mg by mouth as directed.       No current facility-administered medications for this visit.    History   Social History Narrative    She is currently living back at home after a brief stay at [facility] for postoperative rehab.  Currently doing physical therapy.       She is widowed, and has no living children.  One of her granddaughters is with her.   She has 2 grandchildren and 1 great-grandchild. Does not smoke, does not drink.    ROS: A comprehensive Review of Systems - Negative except She still has some mild the legs being fatigued. No further hallucinations or significant anxiety since returning home. Edema improved. The major concern now is pain in her right hip that is limiting her activity. This is due today now positioned screw from her recent hip/femoral surgery. Kidneys to BE reoperative none.  PHYSICAL EXAM BP 110/62  Pulse 80  Ht 5\' 1"  (1.549 m)  Wt 116 lb 6.4 oz (52.799 kg)  BMI 22.01 kg/m2 General: She is a pleasant elderly woman in no acute distress, A&O x3, answers questions appropriately. She is sitting in a wheelchair just simply because that is how she has been brought in. She also has kyphosis but is relatively healthy appearing, just somewhat frail.  HEENT: NCAT, EOMI, MMM, and anicteric sclerae.  Neck: Supple; no LAN, JVD, or carotid bruit.  Heart: RRR, normal S1, S2. No R/G but maybe a soft systolic click and a soft maybe 1/6 SEM along the sternal border.  Lungs: CTAB, nonlabored, normal effort, good air  movement.  Abdomen: Soft/NT/ND/NABS. No HSM.  Extremities: No C/C with trivial right-sided the edema that is nonpitting  YQM:VHQIONGEX today: Yes Rate: 80 , Rhythm: NSR, normal ECG;   Recent Labs: None checked recently that is available to me  ASSESSMENT / PLAN: Pre-operative cardiovascular examination Low risk surgery. PREOPERATIVE CARDIAC RISK ASSESSMENT   Revised Cardiac Risk Index:  High Risk Surgery: no; low risk  Defined as Intraperitoneal, intrathoracic or suprainguinal vascular  Active CAD: no;   CHF: no;   Cerebrovascular Disease: no; none documented  Diabetes: no; On Insulin: no  CKD (Cr >~ 2): no;   Total: 0 Estimated Risk of Adverse Outcome: LOW RISK  Estimated Risk of MI, PE, VF/VT (Cardiac Arrest), Complete Heart Block: Less than 1 %  ACC/AHA Guidelines for "Clearance":  Step 1 - Need for Emergency Surgery: No: Urgent  If Yes - go straight to OR with perioperative surveillance  Step 2 - Active Cardiac Conditions (Unstable Angina, Decompensated HF, Significant  Arrhytmias - Complete HB, Mobitz II, Symptomatic VT or SVT, Severe Aortic Stenosis - mean gradient > 40 mmHg, Valve area < 1.0 cm2):   No: Revascularized in 2004 no active symptoms. Normal stress test.  Normal echocardiogram.  If Yes - Evaluate & Treat per ACC/AHA Guidelines  Step 3 -  Low Risk Surgery: Yes  If Yes --> proceed to OR, no further evaluation required   Based on the above ACC/AHA Guidelines and Revisedc Risk Index Scorex score, the recommendation made to proceed to the OR without any further evaluation. She'll be low risk for cardiovascular events. We'll be available as needed for consultation.  CAD (coronary artery disease), native coronary artery - PCI to RCA & Circumflex 2004 Stable, no symptoms. Recent stress test negative. Not on aspirin or Plavix as she is on warfarin. She is  on Imdur, but because of concern for hypotension has not been on a beta blocker or ACE inhibitor. The  beta blocker and amlodipine is now, as needed basis.   AAA (abdominal aortic aneurysm) Operation was by Dr. Arbie Cookey. No recent followups. Not having any claudication symptoms.  Atherosclerosis of native arteries of the extremities, unspecified Status post AAA repair with aorta- bifemoral surgery. This was back in 2004. Prior to that she had right iliac stent with fem-fem bypass. Followed by Dr. Arbie Cookey. No active symptoms.  DVT, Right Femoral, "chronic" -  with extension to the peroneal Remains on warfarin. This will need to be addressed perioperatively. He should be fine with stopping it 5 days preop, however, I will defer to Dr.Avva.  Dyslipidemia, goal LDL below 70 She is not on a statin. As well as I known her, this is been monitored by her PCP. I do not have any recent labs.    Orders Placed This Encounter  Procedures  . EKG 12-Lead    Followup: One year  DAVID W. Herbie Baltimore, M.D., M.S. [facility] [address] [place], [place]  [address]  [phone] Pager # [phone]

## 2012-12-13 ENCOUNTER — Encounter: Payer: Self-pay | Admitting: *Deleted

## 2012-12-13 ENCOUNTER — Telehealth: Payer: Self-pay | Admitting: *Deleted

## 2012-12-13 NOTE — Telephone Encounter (Signed)
Faxed clearance for upcoming Bursectomy.

## 2012-12-14 ENCOUNTER — Encounter: Payer: Self-pay | Admitting: Cardiology

## 2012-12-14 DIAGNOSIS — Z01810 Encounter for preprocedural cardiovascular examination: Secondary | ICD-10-CM | POA: Insufficient documentation

## 2012-12-14 DIAGNOSIS — E785 Hyperlipidemia, unspecified: Secondary | ICD-10-CM | POA: Insufficient documentation

## 2012-12-14 NOTE — Assessment & Plan Note (Signed)
She is not on a statin. As well as I known her, this is been monitored by her PCP. I do not have any recent labs.

## 2012-12-14 NOTE — Assessment & Plan Note (Signed)
Low risk surgery. PREOPERATIVE CARDIAC RISK ASSESSMENT   Revised Cardiac Risk Index:  High Risk Surgery: no; low risk  Defined as Intraperitoneal, intrathoracic or suprainguinal vascular  Active CAD: no;   CHF: no;   Cerebrovascular Disease: no; none documented  Diabetes: no; On Insulin: no  CKD (Cr >~ 2): no;   Total: 0 Estimated Risk of Adverse Outcome: LOW RISK  Estimated Risk of MI, PE, VF/VT (Cardiac Arrest), Complete Heart Block: Less than 1 %  ACC/AHA Guidelines for "Clearance":  Step 1 - Need for Emergency Surgery: No: Urgent  If Yes - go straight to OR with perioperative surveillance  Step 2 - Active Cardiac Conditions (Unstable Angina, Decompensated HF, Significant  Arrhytmias - Complete HB, Mobitz II, Symptomatic VT or SVT, Severe Aortic Stenosis - mean gradient > 40 mmHg, Valve area < 1.0 cm2):   No: Revascularized in 2004 no active symptoms. Normal stress test.  Normal echocardiogram.  If Yes - Evaluate & Treat per ACC/AHA Guidelines  Step 3 -  Low Risk Surgery: Yes  If Yes --> proceed to OR, no further evaluation required   Based on the above ACC/AHA Guidelines and Revisedc Risk Index Scorex score, the recommendation made to proceed to the OR without any further evaluation. She'll be low risk for cardiovascular events. We'll be available as needed for consultation.

## 2012-12-14 NOTE — Assessment & Plan Note (Signed)
Remains on warfarin. This will need to be addressed perioperatively. He should be fine with stopping it 5 days preop, however, I will defer to Dr.Avva.

## 2012-12-14 NOTE — Assessment & Plan Note (Addendum)
Status post AAA repair with aorta- bifemoral surgery. This was back in 2004. Prior to that she had right iliac stent with fem-fem bypass. Followed by Dr. Arbie Cookey. No active symptoms.

## 2012-12-14 NOTE — Assessment & Plan Note (Signed)
Stable, no symptoms. Recent stress test negative. Not on aspirin or Plavix as she is on warfarin. She is on Imdur, but because of concern for hypotension has not been on a beta blocker or ACE inhibitor. The beta blocker and amlodipine is now, as needed basis.

## 2012-12-14 NOTE — Assessment & Plan Note (Signed)
Operation was by Dr. Arbie Cookey. No recent followups. Not having any claudication symptoms.

## 2012-12-18 ENCOUNTER — Other Ambulatory Visit (HOSPITAL_COMMUNITY): Payer: Self-pay | Admitting: Orthopaedic Surgery

## 2012-12-20 ENCOUNTER — Encounter (HOSPITAL_COMMUNITY): Payer: Self-pay | Admitting: Pharmacy Technician

## 2012-12-23 ENCOUNTER — Other Ambulatory Visit (HOSPITAL_COMMUNITY): Payer: Self-pay | Admitting: Internal Medicine

## 2012-12-23 ENCOUNTER — Encounter (HOSPITAL_COMMUNITY): Payer: Self-pay

## 2012-12-23 ENCOUNTER — Ambulatory Visit (HOSPITAL_COMMUNITY)
Admission: RE | Admit: 2012-12-23 | Discharge: 2012-12-23 | Disposition: A | Discharge status: Home / Self Care | Payer: Medicare Other | Source: Ambulatory Visit | Attending: Internal Medicine | Admitting: Internal Medicine

## 2012-12-23 DIAGNOSIS — I82409 Acute embolism and thrombosis of unspecified deep veins of unspecified lower extremity: Secondary | ICD-10-CM | POA: Insufficient documentation

## 2012-12-23 DIAGNOSIS — Z7901 Long term (current) use of anticoagulants: Secondary | ICD-10-CM | POA: Insufficient documentation

## 2012-12-23 MED ORDER — ENOXAPARIN SODIUM 80 MG/0.8ML ~~LOC~~ SOLN
80.0000 mg | Freq: Every day | SUBCUTANEOUS | Status: DC
  Administered 2012-12-23: 80 mg via SUBCUTANEOUS
  Filled 2012-12-23 (×2): qty 0.8

## 2012-12-23 NOTE — Discharge Instructions (Signed)
Lovenox Enoxaparin injection What is this medicine? ENOXAPARIN (ee nox a PA rin) is used after knee, hip, or abdominal surgeries to prevent blood clotting. It is also used to treat existing blood clots in the lungs or in the veins. This medicine may be used for other purposes; ask your health care provider or pharmacist if you have questions. What should I tell my health care provider before I take this medicine? They need to know if you have any of these conditions: -bleeding disorders, hemorrhage, or hemophilia -infection of the heart or heart valves -kidney or liver disease -previous stroke -prosthetic heart valve -recent surgery or delivery of a baby -ulcer in the stomach or intestine, diverticulitis, or other bowel disease -an unusual or allergic reaction to enoxaparin, heparin, pork or pork products, other medicines, foods, dyes, or preservatives -pregnant or trying to get pregnant -breast-feeding How should I use this medicine? This medicine is for injection under the skin. It is usually given by a health-care professional. You or a family member may be trained on how to give the injections. If you are to give yourself injections, make sure you understand how to use the syringe, measure the dose if necessary, and give the injection. To avoid bruising, do not rub the site where this medicine has been injected. Do not take your medicine more often than directed. Do not stop taking except on the advice of your doctor or health care professional. Make sure you receive a puncture-resistant container to dispose of the needles and syringes once you have finished with them. Do not reuse these items. Return the container to your doctor or health care professional for proper disposal. Talk to your pediatrician regarding the use of this medicine in children. Special care may be needed. Overdosage: If you think you have taken too much of this medicine contact a poison control center or emergency room  at once. NOTE: This medicine is only for you. Do not share this medicine with others. What if I miss a dose? If you miss a dose, take it as soon as you can. If it is almost time for your next dose, take only that dose. Do not take double or extra doses. What may interact with this medicine? Do not take this medicine with any of the following medications: -aspirin and aspirin-like medicines -heparin -mifepristone -warfarin This medicine may also interact with the following medications: -cilostazol -clopidogrel -dipyridamole -NSAIDs, medicines for pain and inflammation, like ibuprofen or naproxen -sulfinpyrazone -ticlopidine This list may not describe all possible interactions. Give your health care provider a list of all the medicines, herbs, non-prescription drugs, or dietary supplements you use. Also tell them if you smoke, drink alcohol, or use illegal drugs. Some items may interact with your medicine. What should I watch for while using this medicine? Visit your doctor or health care professional for regular checks on your progress. Your condition will be monitored carefully while you are receiving this medicine. Contact your doctor or health care professional and seek emergency treatment if you develop increased difficulty in breathing, chest pain, dizziness, shortness of breath, swelling in the legs or arms, abdominal pain, decreased vision, pain when walking, or pain and warmth of the arms or legs. These can be signs that your condition has gotten worse. Monitor your skin closely for easy bruising or red spots, which can be signs of bleeding. If you notice easy bruising or minor bleeding from the nose, gums/teeth, in your urine, or stool, contact your doctor or health care  professional right away. The dose of your medicine may need to be changed. If you are going to have surgery, tell your doctor or health care professional that you are taking this medicine. Try to avoid injury while you  are using this medicine. Be careful when brushing or flossing your teeth, shaving, cutting your fingernails or toenails, or when using sharp objects. Report any injuries to your doctor or health care professional. What side effects may I notice from receiving this medicine? Side effects that you should report to your doctor or health care professional as soon as possible: -allergic reactions like skin rash, itching or hives, swelling of the face, lips, or tongue -black, tarry stools -breathing problems -dark urine -feeling faint or lightheaded, falls -fever -heavy menstrual bleeding -unusual bruising or bleeding Side effects that usually do not require medical attention (report to your doctor or health care professional if they continue or are bothersome): -pain or irritation at the injection site This list may not describe all possible side effects. Call your doctor for medical advice about side effects. You may report side effects to FDA at 1-800-FDA-1088. Where should I keep my medicine? Keep out of the reach of children. Store at room temperature between 15 and 30 degrees C (59 and 86 degrees F). Do not freeze. If your injections have been specially prepared, you may need to store them in the refrigerator. Ask your pharmacist. Throw away any unused medicine after the expiration date. NOTE: This sheet is a summary. It may not cover all possible information. If you have questions about this medicine, talk to your doctor, pharmacist, or health care provider.  2013, [employer]. (04/19/2007 4:47:37 PM)

## 2012-12-24 ENCOUNTER — Encounter (HOSPITAL_COMMUNITY): Payer: Self-pay

## 2012-12-24 ENCOUNTER — Encounter (HOSPITAL_COMMUNITY)
Admission: RE | Admit: 2012-12-24 | Discharge: 2012-12-24 | Disposition: A | Discharge status: Home / Self Care | Payer: Medicare Other | Source: Ambulatory Visit | Attending: Orthopaedic Surgery | Admitting: Orthopaedic Surgery

## 2012-12-24 ENCOUNTER — Ambulatory Visit (HOSPITAL_COMMUNITY)
Admission: RE | Admit: 2012-12-24 | Discharge: 2012-12-24 | Disposition: A | Discharge status: Home / Self Care | Payer: Medicare Other | Source: Ambulatory Visit | Attending: Orthopaedic Surgery | Admitting: Orthopaedic Surgery

## 2012-12-24 DIAGNOSIS — Z79899 Other long term (current) drug therapy: Secondary | ICD-10-CM | POA: Insufficient documentation

## 2012-12-24 DIAGNOSIS — I82409 Acute embolism and thrombosis of unspecified deep veins of unspecified lower extremity: Secondary | ICD-10-CM | POA: Insufficient documentation

## 2012-12-24 HISTORY — DX: Rheumatoid arthritis, unspecified: M06.9

## 2012-12-24 LAB — CBC
Hemoglobin: 12.6 g/dL (ref 12.0–15.0)
MCH: 29.4 pg (ref 26.0–34.0)
MCV: 90.7 fL (ref 78.0–100.0)
Platelets: 207 10*3/uL (ref 150–400)
RBC: 4.28 MIL/uL (ref 3.87–5.11)
WBC: 7.6 10*3/uL (ref 4.0–10.5)

## 2012-12-24 LAB — PROTIME-INR: INR: 1.42 (ref 0.00–1.49)

## 2012-12-24 LAB — BASIC METABOLIC PANEL
BUN: 10 mg/dL (ref 6–23)
CO2: 28 mEq/L (ref 19–32)
Calcium: 10 mg/dL (ref 8.4–10.5)
Chloride: 98 mEq/L (ref 96–112)
Creatinine, Ser: 0.56 mg/dL (ref 0.50–1.10)
GFR calc non Af Amer: 86 mL/min — ABNORMAL LOW (ref >90)
Glucose, Bld: 169 mg/dL — ABNORMAL HIGH (ref 70–99)

## 2012-12-24 MED ORDER — ENOXAPARIN SODIUM 80 MG/0.8ML ~~LOC~~ SOLN
80.0000 mg | Freq: Every day | SUBCUTANEOUS | Status: DC
  Administered 2012-12-24: 80 mg via SUBCUTANEOUS
  Filled 2012-12-24 (×2): qty 0.8

## 2012-12-24 NOTE — Progress Notes (Signed)
Cardiac clearance for surgery Dr. Herbie Baltimore 12/13/12, Chest x-ray 04/12/12 on EP[facility], EKG 09/11/12 on [facility]

## 2012-12-24 NOTE — Patient Instructions (Signed)
20 [name]  12/24/2012   Your procedure is scheduled on: 12/27/12  Report to [facility] at 8:00 AM.  Call this number if you have problems the morning of surgery [phone]: [phone]   Remember:   Do not eat food or drink liquids After Midnight.     Take these medicines the morning of surgery with A SIP OF WATER: prilosec, imdur   Do not wear jewelry, make-up or nail polish.  Do not wear lotions, powders, or perfumes. You may wear deodorant.  Do not shave 48 hours prior to surgery. Men may shave face and neck.  Do not bring valuables to the hospital.  Contacts, dentures or bridgework may not be worn into surgery.  Leave suitcase in the car. After surgery it may be brought to your room.  For patients admitted to the hospital, checkout time is 11:00 AM the day of discharge.   Birdie Sons, RN  pre op nurse call if needed [phone]    FAILURE TO FOLLOW THESE INSTRUCTIONS MAY RESULT IN CANCELLATION OF YOUR SURGERY   Patient Signature: ___________________________________________

## 2012-12-25 ENCOUNTER — Encounter (HOSPITAL_COMMUNITY): Payer: Self-pay

## 2012-12-25 ENCOUNTER — Ambulatory Visit (HOSPITAL_COMMUNITY)
Admission: RE | Admit: 2012-12-25 | Discharge: 2012-12-25 | Disposition: A | Discharge status: Home / Self Care | Payer: Medicare Other | Source: Ambulatory Visit | Attending: Internal Medicine | Admitting: Internal Medicine

## 2012-12-25 DIAGNOSIS — I82409 Acute embolism and thrombosis of unspecified deep veins of unspecified lower extremity: Secondary | ICD-10-CM | POA: Insufficient documentation

## 2012-12-25 LAB — PROTIME-INR
INR: 1.13 (ref 0.00–1.49)
Prothrombin Time: 14.3 seconds (ref 11.6–15.2)

## 2012-12-25 MED ORDER — ENOXAPARIN SODIUM 80 MG/0.8ML ~~LOC~~ SOLN
80.0000 mg | Freq: Every day | SUBCUTANEOUS | Status: DC
  Administered 2012-12-25: 15:00:00 80 mg via SUBCUTANEOUS
  Filled 2012-12-25 (×2): qty 0.8

## 2012-12-26 ENCOUNTER — Encounter (HOSPITAL_COMMUNITY): Payer: Self-pay

## 2012-12-26 ENCOUNTER — Encounter (HOSPITAL_COMMUNITY)
Admission: RE | Admit: 2012-12-26 | Discharge: 2012-12-26 | Disposition: A | Discharge status: Home / Self Care | Payer: Medicare Other | Source: Ambulatory Visit | Attending: Internal Medicine | Admitting: Internal Medicine

## 2012-12-26 DIAGNOSIS — I82409 Acute embolism and thrombosis of unspecified deep veins of unspecified lower extremity: Secondary | ICD-10-CM | POA: Insufficient documentation

## 2012-12-26 DIAGNOSIS — Z7901 Long term (current) use of anticoagulants: Secondary | ICD-10-CM | POA: Insufficient documentation

## 2012-12-26 MED ORDER — ENOXAPARIN SODIUM 80 MG/0.8ML ~~LOC~~ SOLN
80.0000 mg | Freq: Every day | SUBCUTANEOUS | Status: AC
  Administered 2012-12-26: 80 mg via SUBCUTANEOUS
  Filled 2012-12-26: qty 0.8

## 2012-12-27 ENCOUNTER — Encounter (HOSPITAL_COMMUNITY): Payer: Self-pay | Admitting: *Deleted

## 2012-12-27 ENCOUNTER — Encounter (HOSPITAL_COMMUNITY): Payer: Medicare Other | Admitting: Anesthesiology

## 2012-12-27 ENCOUNTER — Ambulatory Visit (HOSPITAL_COMMUNITY): Payer: Medicare Other

## 2012-12-27 ENCOUNTER — Ambulatory Visit (HOSPITAL_COMMUNITY): Payer: Medicare Other | Admitting: Anesthesiology

## 2012-12-27 ENCOUNTER — Encounter (HOSPITAL_COMMUNITY)
Admission: RE | Disposition: A | Discharge status: Home Health | Payer: Self-pay | Source: Ambulatory Visit | Attending: Orthopaedic Surgery

## 2012-12-27 ENCOUNTER — Inpatient Hospital Stay (HOSPITAL_COMMUNITY)
Admission: RE | Admit: 2012-12-27 | Discharge: 2013-01-01 | DRG: 499 | Disposition: A | Discharge status: Home Health | Payer: Medicare Other | Source: Ambulatory Visit | Attending: Orthopaedic Surgery | Admitting: Orthopaedic Surgery

## 2012-12-27 DIAGNOSIS — Z9861 Coronary angioplasty status: Secondary | ICD-10-CM

## 2012-12-27 DIAGNOSIS — I70209 Unspecified atherosclerosis of native arteries of extremities, unspecified extremity: Secondary | ICD-10-CM

## 2012-12-27 DIAGNOSIS — Z01810 Encounter for preprocedural cardiovascular examination: Secondary | ICD-10-CM

## 2012-12-27 DIAGNOSIS — J449 Chronic obstructive pulmonary disease, unspecified: Secondary | ICD-10-CM | POA: Diagnosis present

## 2012-12-27 DIAGNOSIS — M25559 Pain in unspecified hip: Secondary | ICD-10-CM | POA: Diagnosis present

## 2012-12-27 DIAGNOSIS — E119 Type 2 diabetes mellitus without complications: Secondary | ICD-10-CM | POA: Diagnosis present

## 2012-12-27 DIAGNOSIS — T8484XD Pain due to internal orthopedic prosthetic devices, implants and grafts, subsequent encounter: Secondary | ICD-10-CM

## 2012-12-27 DIAGNOSIS — K219 Gastro-esophageal reflux disease without esophagitis: Secondary | ICD-10-CM | POA: Diagnosis present

## 2012-12-27 DIAGNOSIS — Z9181 History of falling: Secondary | ICD-10-CM

## 2012-12-27 DIAGNOSIS — I2510 Atherosclerotic heart disease of native coronary artery without angina pectoris: Secondary | ICD-10-CM | POA: Diagnosis present

## 2012-12-27 DIAGNOSIS — I10 Essential (primary) hypertension: Secondary | ICD-10-CM | POA: Diagnosis present

## 2012-12-27 DIAGNOSIS — Z87891 Personal history of nicotine dependence: Secondary | ICD-10-CM

## 2012-12-27 DIAGNOSIS — M069 Rheumatoid arthritis, unspecified: Secondary | ICD-10-CM | POA: Diagnosis present

## 2012-12-27 DIAGNOSIS — K449 Diaphragmatic hernia without obstruction or gangrene: Secondary | ICD-10-CM | POA: Diagnosis present

## 2012-12-27 DIAGNOSIS — M810 Age-related osteoporosis without current pathological fracture: Secondary | ICD-10-CM | POA: Diagnosis present

## 2012-12-27 DIAGNOSIS — I739 Peripheral vascular disease, unspecified: Secondary | ICD-10-CM | POA: Diagnosis present

## 2012-12-27 DIAGNOSIS — E785 Hyperlipidemia, unspecified: Secondary | ICD-10-CM | POA: Diagnosis present

## 2012-12-27 DIAGNOSIS — J4489 Other specified chronic obstructive pulmonary disease: Secondary | ICD-10-CM | POA: Diagnosis present

## 2012-12-27 DIAGNOSIS — T8484XA Pain due to internal orthopedic prosthetic devices, implants and grafts, initial encounter: Secondary | ICD-10-CM

## 2012-12-27 DIAGNOSIS — Z96649 Presence of unspecified artificial hip joint: Secondary | ICD-10-CM

## 2012-12-27 DIAGNOSIS — M169 Osteoarthritis of hip, unspecified: Secondary | ICD-10-CM | POA: Diagnosis present

## 2012-12-27 DIAGNOSIS — Y831 Surgical operation with implant of artificial internal device as the cause of abnormal reaction of the patient, or of later complication, without mention of misadventure at the time of the procedure: Secondary | ICD-10-CM | POA: Diagnosis present

## 2012-12-27 DIAGNOSIS — T84498A Other mechanical complication of other internal orthopedic devices, implants and grafts, initial encounter: Principal | ICD-10-CM | POA: Diagnosis present

## 2012-12-27 DIAGNOSIS — M76899 Other specified enthesopathies of unspecified lower limb, excluding foot: Secondary | ICD-10-CM | POA: Diagnosis present

## 2012-12-27 DIAGNOSIS — Z885 Allergy status to narcotic agent status: Secondary | ICD-10-CM

## 2012-12-27 DIAGNOSIS — Z96619 Presence of unspecified artificial shoulder joint: Secondary | ICD-10-CM

## 2012-12-27 DIAGNOSIS — M1610 Unilateral primary osteoarthritis, unspecified hip: Secondary | ICD-10-CM | POA: Diagnosis present

## 2012-12-27 DIAGNOSIS — Z880 Allergy status to penicillin: Secondary | ICD-10-CM

## 2012-12-27 DIAGNOSIS — Z8249 Family history of ischemic heart disease and other diseases of the circulatory system: Secondary | ICD-10-CM

## 2012-12-27 HISTORY — PX: EXCISION/RELEASE BURSA HIP: SHX5014

## 2012-12-27 LAB — GLUCOSE, CAPILLARY: Glucose-Capillary: 97 mg/dL (ref 70–99)

## 2012-12-27 SURGERY — RELEASE, BURSA, TROCHANTERIC
Anesthesia: General | Site: Hip | Laterality: Right | Wound class: Clean

## 2012-12-27 MED ORDER — FENTANYL CITRATE 0.05 MG/ML IJ SOLN: 25.0000 ug | INTRAMUSCULAR | Status: DC | PRN

## 2012-12-27 MED ORDER — ONDANSETRON HCL 4 MG/2ML IJ SOLN: 4.0000 mg | Freq: Four times a day (QID) | INTRAMUSCULAR | Status: DC | PRN

## 2012-12-27 MED ORDER — CEFAZOLIN SODIUM 1-5 GM-% IV SOLN
1.0000 g | Freq: Four times a day (QID) | INTRAVENOUS | Status: AC
  Administered 2012-12-27 – 2012-12-28 (×3): 1 g via INTRAVENOUS
  Filled 2012-12-27 (×6): qty 50

## 2012-12-27 MED ORDER — METOCLOPRAMIDE HCL 5 MG PO TABS
5.0000 mg | ORAL_TABLET | Freq: Three times a day (TID) | ORAL | Status: DC | PRN
  Filled 2012-12-27: qty 2

## 2012-12-27 MED ORDER — ADULT MULTIVITAMIN W/MINERALS CH
1.0000 | ORAL_TABLET | Freq: Every day | ORAL | Status: DC
  Administered 2012-12-27 – 2013-01-01 (×6): 1 via ORAL
  Filled 2012-12-27 (×6): qty 1

## 2012-12-27 MED ORDER — HYDRALAZINE HCL 20 MG/ML IJ SOLN
5.0000 mg | INTRAMUSCULAR | Status: AC
  Administered 2012-12-27: 12:00:00 via INTRAVENOUS

## 2012-12-27 MED ORDER — OXYCODONE HCL 5 MG PO TABS: 5.0000 mg | ORAL_TABLET | ORAL | Status: DC | PRN

## 2012-12-27 MED ORDER — OMEPRAZOLE MAGNESIUM 20 MG PO TBEC
20.0000 mg | DELAYED_RELEASE_TABLET | Freq: Every morning | ORAL | Status: DC
  Administered 2012-12-28: 20 mg via ORAL
  Filled 2012-12-27 (×2): qty 1

## 2012-12-27 MED ORDER — TRAMADOL HCL 50 MG PO TABS
100.0000 mg | ORAL_TABLET | Freq: Four times a day (QID) | ORAL | Status: DC | PRN
  Administered 2012-12-27 – 2012-12-28 (×3): 100 mg via ORAL
  Filled 2012-12-27 (×4): qty 2

## 2012-12-27 MED ORDER — SULFASALAZINE 500 MG PO TABS
500.0000 mg | ORAL_TABLET | Freq: Every evening | ORAL | Status: DC
  Administered 2012-12-27 – 2012-12-31 (×5): 500 mg via ORAL
  Filled 2012-12-27 (×6): qty 1

## 2012-12-27 MED ORDER — LIDOCAINE HCL (CARDIAC) 20 MG/ML IV SOLN
INTRAVENOUS | Status: DC | PRN
  Administered 2012-12-27: 50 mg via INTRAVENOUS

## 2012-12-27 MED ORDER — WARFARIN SODIUM 3 MG PO TABS: 3.0000 mg | ORAL_TABLET | ORAL | Status: DC

## 2012-12-27 MED ORDER — WARFARIN SODIUM 4 MG PO TABS
4.0000 mg | ORAL_TABLET | Freq: Once | ORAL | Status: AC
  Administered 2012-12-27: 18:00:00 4 mg via ORAL
  Filled 2012-12-27: qty 1

## 2012-12-27 MED ORDER — VITAMIN D3 25 MCG (1000 UNIT) PO TABS
2000.0000 [IU] | ORAL_TABLET | Freq: Every morning | ORAL | Status: DC
  Administered 2012-12-27 – 2013-01-01 (×6): 2000 [IU] via ORAL
  Filled 2012-12-27 (×6): qty 2

## 2012-12-27 MED ORDER — LACTATED RINGERS IV SOLN
INTRAVENOUS | Status: DC
  Administered 2012-12-27: 1000 mL via INTRAVENOUS

## 2012-12-27 MED ORDER — ONDANSETRON HCL 4 MG/2ML IJ SOLN
INTRAMUSCULAR | Status: DC | PRN
  Administered 2012-12-27: 4 mg via INTRAVENOUS

## 2012-12-27 MED ORDER — CLINDAMYCIN PHOSPHATE 900 MG/50ML IV SOLN
900.0000 mg | INTRAVENOUS | Status: AC
  Administered 2012-12-27: 900 mg via INTRAVENOUS
  Filled 2012-12-27: qty 50

## 2012-12-27 MED ORDER — PHENYLEPHRINE HCL 10 MG/ML IJ SOLN
INTRAMUSCULAR | Status: DC | PRN
  Administered 2012-12-27: 80 ug via INTRAVENOUS

## 2012-12-27 MED ORDER — ENOXAPARIN SODIUM 80 MG/0.8ML ~~LOC~~ SOLN
80.0000 mg | Freq: Every day | SUBCUTANEOUS | Status: DC
  Administered 2012-12-28 – 2012-12-31 (×5): 80 mg via SUBCUTANEOUS
  Filled 2012-12-27 (×6): qty 0.8

## 2012-12-27 MED ORDER — LINACLOTIDE 145 MCG PO CAPS
145.0000 ug | ORAL_CAPSULE | Freq: Every morning | ORAL | Status: DC
  Administered 2012-12-27 – 2013-01-01 (×6): 145 ug via ORAL
  Filled 2012-12-27 (×6): qty 1

## 2012-12-27 MED ORDER — 0.9 % SODIUM CHLORIDE (POUR BTL) OPTIME
TOPICAL | Status: DC | PRN
  Administered 2012-12-27: 200 mL

## 2012-12-27 MED ORDER — ZOLPIDEM TARTRATE 5 MG PO TABS: 5.0000 mg | ORAL_TABLET | Freq: Every evening | ORAL | Status: DC | PRN

## 2012-12-27 MED ORDER — ONDANSETRON HCL 4 MG PO TABS
4.0000 mg | ORAL_TABLET | Freq: Four times a day (QID) | ORAL | Status: DC | PRN
  Filled 2012-12-27: qty 1

## 2012-12-27 MED ORDER — KETAMINE HCL 10 MG/ML IJ SOLN
INTRAMUSCULAR | Status: DC | PRN
  Administered 2012-12-27: 30 mg via INTRAVENOUS

## 2012-12-27 MED ORDER — AMLODIPINE BESYLATE 5 MG PO TABS
5.0000 mg | ORAL_TABLET | Freq: Every day | ORAL | Status: DC | PRN
  Filled 2012-12-27: qty 1

## 2012-12-27 MED ORDER — METOCLOPRAMIDE HCL 5 MG/ML IJ SOLN: 5.0000 mg | Freq: Three times a day (TID) | INTRAMUSCULAR | Status: DC | PRN

## 2012-12-27 MED ORDER — METHOCARBAMOL 100 MG/ML IJ SOLN
500.0000 mg | Freq: Four times a day (QID) | INTRAVENOUS | Status: DC | PRN
  Administered 2012-12-27: 500 mg via INTRAVENOUS
  Filled 2012-12-27: qty 5

## 2012-12-27 MED ORDER — WARFARIN - PHARMACIST DOSING INPATIENT: Freq: Every day | Status: DC

## 2012-12-27 MED ORDER — DIPHENHYDRAMINE HCL 12.5 MG/5ML PO ELIX
12.5000 mg | ORAL_SOLUTION | ORAL | Status: DC | PRN
  Filled 2012-12-27: qty 10

## 2012-12-27 MED ORDER — PROPOFOL 10 MG/ML IV BOLUS
INTRAVENOUS | Status: DC | PRN
  Administered 2012-12-27: 100 mg via INTRAVENOUS

## 2012-12-27 MED ORDER — ISOSORBIDE MONONITRATE ER 30 MG PO TB24
30.0000 mg | ORAL_TABLET | Freq: Every morning | ORAL | Status: DC
  Administered 2012-12-27 – 2013-01-01 (×6): 30 mg via ORAL
  Filled 2012-12-27 (×6): qty 1

## 2012-12-27 MED ORDER — HYDROXYCHLOROQUINE SULFATE 200 MG PO TABS
200.0000 mg | ORAL_TABLET | Freq: Two times a day (BID) | ORAL | Status: DC
  Administered 2012-12-27 – 2013-01-01 (×10): 200 mg via ORAL
  Filled 2012-12-27 (×12): qty 1

## 2012-12-27 MED ORDER — ESCITALOPRAM OXALATE 5 MG PO TABS
5.0000 mg | ORAL_TABLET | Freq: Every evening | ORAL | Status: DC
  Administered 2012-12-27 – 2012-12-31 (×5): 5 mg via ORAL
  Filled 2012-12-27 (×6): qty 1

## 2012-12-27 MED ORDER — PROMETHAZINE HCL 25 MG/ML IJ SOLN: 6.2500 mg | INTRAMUSCULAR | Status: DC | PRN

## 2012-12-27 MED ORDER — HYDROCODONE-ACETAMINOPHEN 5-325 MG PO TABS: 1.0000 | ORAL_TABLET | ORAL | Status: DC | PRN

## 2012-12-27 MED ORDER — MORPHINE SULFATE 10 MG/ML IJ SOLN: 1.0000 mg | INTRAMUSCULAR | Status: DC | PRN

## 2012-12-27 MED ORDER — BUPIVACAINE-EPINEPHRINE 0.25% -1:200000 IJ SOLN
INTRAMUSCULAR | Status: DC | PRN
  Administered 2012-12-27: 20 mL

## 2012-12-27 MED ORDER — SODIUM CHLORIDE 0.9 % IV SOLN
INTRAVENOUS | Status: DC
  Administered 2012-12-27: 75 mL/h via INTRAVENOUS
  Administered 2012-12-28: 15:00:00 via INTRAVENOUS

## 2012-12-27 MED ORDER — HYDRALAZINE HCL 20 MG/ML IJ SOLN
INTRAMUSCULAR | Status: AC
  Filled 2012-12-27: qty 1

## 2012-12-27 MED ORDER — POLYVINYL ALCOHOL 1.4 % OP SOLN
1.0000 [drp] | OPHTHALMIC | Status: DC | PRN
  Administered 2012-12-30: 13:00:00 1 [drp] via OPHTHALMIC
  Filled 2012-12-27: qty 15

## 2012-12-27 MED ORDER — FENTANYL CITRATE 0.05 MG/ML IJ SOLN
INTRAMUSCULAR | Status: DC | PRN
  Administered 2012-12-27 (×2): 25 ug via INTRAVENOUS

## 2012-12-27 MED ORDER — BUPIVACAINE HCL (PF) 0.25 % IJ SOLN
INTRAMUSCULAR | Status: AC
  Filled 2012-12-27: qty 30

## 2012-12-27 MED ORDER — METHOCARBAMOL 500 MG PO TABS
500.0000 mg | ORAL_TABLET | Freq: Four times a day (QID) | ORAL | Status: DC | PRN
  Administered 2012-12-28 (×2): 500 mg via ORAL
  Filled 2012-12-27 (×2): qty 1

## 2012-12-27 MED ORDER — PROMETHAZINE HCL 12.5 MG PO TABS
12.5000 mg | ORAL_TABLET | Freq: Four times a day (QID) | ORAL | Status: DC | PRN
  Filled 2012-12-27: qty 1

## 2012-12-27 SURGICAL SUPPLY — 42 items
ANCH SUT 2 CP-2 EBND QANCHR+ (Anchor) ×1 IMPLANT
ANCHOR SUPER QUICK (Anchor) ×2 IMPLANT
BAG SPEC THK2 15X12 ZIP CLS (MISCELLANEOUS) ×1
BAG ZIPLOCK 12X15 (MISCELLANEOUS) ×2 IMPLANT
BIT DRILL 2.8 QUICK RELEASE (BIT) ×1 IMPLANT
BLADE EXTENDED COATED 6.5IN (ELECTRODE) IMPLANT
CLOTH BEACON ORANGE TIMEOUT ST (SAFETY) ×1 IMPLANT
DRAPE INCISE IOBAN 66X45 STRL (DRAPES) ×2 IMPLANT
DRAPE ORTHO SPLIT 77X108 STRL (DRAPES) ×4
DRAPE POUCH INSTRU U-SHP 10X18 (DRAPES) ×2 IMPLANT
DRAPE SURG ORHT 6 SPLT 77X108 (DRAPES) ×2 IMPLANT
DRAPE U-SHAPE 47X51 STRL (DRAPES) ×2 IMPLANT
DRILL 2.8 QUICK RELEASE (BIT) ×2
DRSG ADAPTIC 3X8 NADH LF (GAUZE/BANDAGES/DRESSINGS) ×2 IMPLANT
DRSG MEPILEX BORDER 4X8 (GAUZE/BANDAGES/DRESSINGS) ×2 IMPLANT
DRSG XEROFORM 1X8 (GAUZE/BANDAGES/DRESSINGS) ×1 IMPLANT
ELECT REM PT RETURN 9FT ADLT (ELECTROSURGICAL) ×2
ELECTRODE REM PT RTRN 9FT ADLT (ELECTROSURGICAL) ×1 IMPLANT
GLOVE BIO SURGEON STRL SZ7.5 (GLOVE) ×2 IMPLANT
GLOVE BIO SURGEON STRL SZ8 (GLOVE) ×4 IMPLANT
GLOVE BIOGEL PI IND STRL 8 (GLOVE) ×1 IMPLANT
GLOVE BIOGEL PI INDICATOR 8 (GLOVE) ×1
GLOVE ECLIPSE 8.0 STRL XLNG CF (GLOVE) ×2 IMPLANT
GOWN STRL REIN XL XLG (GOWN DISPOSABLE) ×4 IMPLANT
IV SET HUBERPLUS 22X1 SAFETY (NEEDLE) ×2 IMPLANT
MANIFOLD NEPTUNE II (INSTRUMENTS) ×2 IMPLANT
NS IRRIG 1000ML POUR BTL (IV SOLUTION) ×2 IMPLANT
PACK TOTAL JOINT (CUSTOM PROCEDURE TRAY) ×2 IMPLANT
PASSER SUT SWANSON 36MM LOOP (INSTRUMENTS) ×1 IMPLANT
POSITIONER SURGICAL ARM (MISCELLANEOUS) ×1 IMPLANT
SPONGE GAUZE 4X4 12PLY (GAUZE/BANDAGES/DRESSINGS) ×2 IMPLANT
STAPLER VISISTAT 35W (STAPLE) ×2 IMPLANT
STRIP CLOSURE SKIN 1/2X4 (GAUZE/BANDAGES/DRESSINGS) ×2 IMPLANT
SUT ETHIBOND NAB CT1 #1 30IN (SUTURE) ×2 IMPLANT
SUT MNCRL AB 4-0 PS2 18 (SUTURE) ×2 IMPLANT
SUT VIC AB 1 CT1 27 (SUTURE) ×6
SUT VIC AB 1 CT1 27XBRD ANTBC (SUTURE) ×3 IMPLANT
SUT VIC AB 2-0 CT1 27 (SUTURE) ×4
SUT VIC AB 2-0 CT1 TAPERPNT 27 (SUTURE) ×3 IMPLANT
SYR 30ML LL (SYRINGE) ×1 IMPLANT
TOWEL OR 17X26 10 PK STRL BLUE (TOWEL DISPOSABLE) ×3 IMPLANT
WATER STERILE IRR 1500ML POUR (IV SOLUTION) ×1 IMPLANT

## 2012-12-27 NOTE — H&P (Signed)
[name] is an 79 y.o. female.   Chief Complaint:   Right hip pain with painful, prominent hardware. HPI:   79 yo female who underwent ORIF of a right hip fracture early this year.  She has since healed the fracture, but she has developed recurrent trochanteric bursitis and pain over a prominent compression screw.  She presents for removeal of the screw and a local bursectomy.  She understands the risks of infection and continued pain.  Past Medical History  Diagnosis Date  . History of  Angina     Treated with PCI in 2004; no active Symptoms.  . Hiatal hernia   . GERD (gastroesophageal reflux disease)   . Iron deficiency anemia   . Barrett esophagus   . Osteoporosis   . HLD (hyperlipidemia)     Statin intolerant  . Internal hemorrhoids   . Diverticulosis   . Pain     RIGHT SHOULDER WITH LIMITED ROM--STATES PREVIOUS RT TOTAL SHOULDDER REPLACEMENT-BUT STILL HAS PROBLEMS WITH SHOULDER  . CAD S/P percutaneous coronary angioplasty 2004    PCI-RCA (2.75mm x 12mm Taxus DES)& Cx (2.75 mm x 14 mm Cypher DES)  . PAD (peripheral artery disease) 1994    History of right iliac stenting, fem-fem bypass in 94; aortobifem bypass/ AAA repair - November 2004; latest ABIs of been roughly 1 bilaterally, followed by Dr. Arbie Cookey  . HTN (hypertension)     sometimes  . COPD (chronic obstructive pulmonary disease)     pt. denies having this  . Shortness of breath     MILD- WITH EXERTION. chronic  . Pneumonia     frequent  . Nocturia   . Headache(784.0)     hx of  . Osteoarthritis of both hips      status post multiple hip operations, recent fall with fracture.    [name] DJD (degenerative joint disease)   . Rheumatoid arthritis   . H/O blood clots     bilateral lower extremity    Past Surgical History  Procedure Laterality Date  . Back surgery      x 2  . Appendectomy      79yrs old  . Total shoulder replacement  2011    right  . Cholecystectomy      several yrs ago  . Cataract extraction   2001, 2002    bilateral  . Total abdominal hysterectomy  1965  . Upper gastrointestinal endoscopy  multiple    w/bx, hiatal hernia, Barretts', esophageal polyp 1x  . Colonoscopy  05/09/2004; 06/08/1999    internal hemorrhoids; diverticulosis  . Rotator cuff repair      bilateral, 2 times on left  . Lumbar spine surgery  October 2012    spinal stenosis, Dr. Ophelia Charter  . Abdominal aortic aneurysm repair  2004    AortoBi-Fem Bypass  . Left carpal tunnel release march 2013    . Total hip arthroplasty  01/26/2012    Procedure: TOTAL HIP ARTHROPLASTY ANTERIOR APPROACH;  Surgeon: Kathryne Hitch, MD;  Location: [facility];  Service: Orthopedics;  Laterality: Left;  Left Total Hip Arthroplasty, Anterior Approach (C-Arm)  . Intramedullary (im) nail intertrochanteric Right 04/12/2012    Procedure: INTRAMEDULLARY (IM) NAIL INTERTROCHANTRIC Right;  Surgeon: Kathryne Hitch, MD;  Location: [facility];  Service: Orthopedics;  Laterality: Right;  . Doppler echocardiography  06/06/2012    EF 50-60%.  Mild LVH, grade one diastolic function; normal PA pressures and CVP  . Lower extremity venous dopplers  08/16/2012  Partially occlusive right femoral vein thrombosis, also noted in right peroneal vein; no thrombosis in left sided veins.  . Nm myoview ltd  April 2014    Bowel artifact, No ischemia or infarction  . Femoral-femoral bypass graft  1994    Following right iliac stenting  . Doppler echocardiography  06/06/2012    Mild LVH, EF 55-60%.  Normal wall motion.  Mildly elevated LVEDP with grade one diastolic function.  Mild LA dilatation.  Normal PA pressures.  . Coronary angioplasty with stent placement  2004    RCA: Taxus DES 2.5 mm x 12 mm; Circumflex: Cypher DES 2.75 mm 14 mm  . Cardiac catheterization  October 2005    Patent RCA stent, 20% ISR of Cx stent; EF 50-55%  . Tubal ligation  1956    Family History  Problem Relation Age of Onset  . Prostate cancer Father   . Diabetes Mother   .  Colon cancer Neg Hx   . Breast cancer Mother   . Heart disease Mother    Social History:  reports that she quit smoking about 12 years ago. Her smoking use included Cigarettes. She smoked 0.00 packs per day. She has never used smokeless tobacco. She reports that she does not drink alcohol or use illicit drugs.  Allergies:  Allergies  Allergen Reactions  . Codeine Nausea And Vomiting, Swelling and Rash  . Hydrocodone Nausea And Vomiting, Swelling and Rash  . Morphine And Related Other (See Comments)    hallucinations  . Other Nausea And Vomiting    Most pain meds other than tramadol. Tylenol, Toradol, and Robaxin are ok.    . Penicillins Hives  . Darvocet [Propoxyphene-Acetaminophen] Hives and Nausea And Vomiting  . Demerol [Meperidine] Nausea And Vomiting  . Statins Other (See Comments)    INTOLERANT OF STATINS - messes up her legs and has muscle aches  . Tape Other (See Comments)    Needs paper tape - skin very fragile    No prescriptions prior to admission    Results for orders placed during the hospital encounter of 12/25/12 (from the past 48 hour(s))  PROTIME-INR     Status: None   Collection Time    12/25/12  2:50 PM      Result Value Range   Prothrombin Time 14.3  11.6 - 15.2 seconds   INR 1.13  0.00 - 1.49   No results found.  Review of Systems  All other systems reviewed and are negative.    There were no vitals taken for this visit. Physical Exam  Constitutional: She is oriented to person, place, and time. She appears well-developed and well-nourished.  HENT:  Head: Normocephalic and atraumatic.  Eyes: Pupils are equal, round, and reactive to light.  Neck: Normal range of motion. Neck supple.  Cardiovascular: Normal rate.   Respiratory: Effort normal and breath sounds normal.  GI: Soft. Bowel sounds are normal.  Musculoskeletal:       Right hip: She exhibits decreased strength and bony tenderness.  Neurological: She is alert and oriented to person,  place, and time.  Skin: Skin is warm and dry.  Psychiatric: She has a normal mood and affect.     Assessment/Plan Painful, prominent hardware right hip post ORIF right intertrochanteric hip fracture 1)  To the OR today for removal of a painful, prominent screw and local trochanteric bursectomy  Murielle Stang Y 12/27/2012, 7:23 AM

## 2012-12-27 NOTE — Brief Op Note (Signed)
12/27/2012  11:48 AM  PATIENT:  [name]  79 y.o. female  PRE-OPERATIVE DIAGNOSIS:  Painful, retained hardware right hip with trochanteric bursitis  POST-OPERATIVE DIAGNOSIS:  Painful, retained hardware right hip with trochanteric bursitis  PROCEDURE:  Procedure(s): RIGHT HIP BURSECTOMY AND COMPRESSION SCREW REMOVAL (Right)  SURGEON:  Surgeon(s) and Role:    * Kathryne Hitch, MD - Primary  PHYSICIAN ASSISTANT:   ASSISTANTS: none   ANESTHESIA:   local and general  EBL:  Total I/O In: 700 [I.V.:700] Out: -   BLOOD ADMINISTERED:none  DRAINS: none   LOCAL MEDICATIONS USED:  MARCAINE     SPECIMEN:  No Specimen  DISPOSITION OF SPECIMEN:  N/A  COUNTS:  YES  TOURNIQUET:  * No tourniquets in log *  DICTATION: .Other Dictation: Dictation Number [id number]  PLAN OF CARE: Admit for overnight observation  PATIENT DISPOSITION:  PACU - hemodynamically stable.   Delay start of Pharmacological VTE agent (>24hrs) due to surgical blood loss or risk of bleeding: no

## 2012-12-27 NOTE — Progress Notes (Signed)
Pt's blood pressure elevated; Dr. Renold Don notified, order rec'd and med given

## 2012-12-27 NOTE — Evaluation (Signed)
Physical Therapy Evaluation Patient Details Name: [name] MRN: [id number] DOB: 06/11/1930 Today's Date: 12/27/2012 Time: 4098-1191 PT Time Calculation (min): 31 min  PT Assessment / Plan / Recommendation History of Present Illness     Clinical Impression  Pt s/p R hip bursectomy and removal of painful hardware presents with functional mobility limitations 2* decreased R LE strength/ROM and post op pain.  Pt should progress to d/c home with family assist and HHPT follow up    PT Assessment  Patient needs continued PT services    Follow Up Recommendations  Home health PT    Does the patient have the potential to tolerate intense rehabilitation      Barriers to Discharge        Equipment Recommendations  Other (comment) (Pt would benefit from Rolator 2* ltd ambulatory distance)    Recommendations for Other Services OT consult   Frequency Min 5X/week    Precautions / Restrictions Precautions Precautions: Fall Precaution Comments: Pt requires shoes on 2* leg length descrepancy Restrictions Weight Bearing Restrictions: No Other Position/Activity Restrictions: WBAT   Pertinent Vitals/Pain 3/10; premed,       Mobility  Bed Mobility Bed Mobility: Supine to Sit Supine to Sit: 4: Min assist;With rails Details for Bed Mobility Assistance: cues for sequence and use of L LE to self assist  Transfers Transfers: Sit to Stand;Stand to Sit Sit to Stand: 4: Min assist Stand to Sit: 4: Min assist Details for Transfer Assistance: cues for use of UEs to self assist  Ambulation/Gait Ambulation/Gait Assistance: 4: Min assist Ambulation Distance (Feet): 58 Feet Assistive device: Rolling walker Ambulation/Gait Assistance Details: min cues for position from RW Gait Pattern: Step-to pattern;Step-through pattern;Antalgic;Trunk flexed    Exercises     PT Diagnosis: Difficulty walking  PT Problem List: Decreased strength;Decreased range of motion;Decreased activity  tolerance;Decreased mobility;Decreased knowledge of use of DME;Pain PT Treatment Interventions: DME instruction;Gait training;Stair training;Functional mobility training;Therapeutic activities;Therapeutic exercise;Patient/family education     PT Goals(Current goals can be found in the care plan section) Acute Rehab PT Goals Patient Stated Goal: Walk with decreased pain PT Goal Formulation: With patient Time For Goal Achievement: 01/03/13 Potential to Achieve Goals: Good  Visit Information  Last PT Received On: 12/27/12 Assistance Needed: +1       Prior Functioning  Home Living Family/patient expects to be discharged to:: Private residence Living Arrangements: Other relatives Available Help at Discharge: Family Type of Home: House Home Access: Stairs to enter Secretary/administrator of Steps: 2 Entrance Stairs-Rails: Right;Left;Can reach both Home Layout: One level Home Equipment: Cane - single point;Walker - 2 wheels Additional Comments: Pt reports RW is not high enough to use easily Prior Function Level of Independence: Independent Communication Communication: No difficulties    Cognition  Cognition Arousal/Alertness: Awake/alert Behavior During Therapy: WFL for tasks assessed/performed Overall Cognitive Status: Within Functional Limits for tasks assessed    Extremity/Trunk Assessment Upper Extremity Assessment Upper Extremity Assessment: RUE deficits/detail;LUE deficits/detail RUE Deficits / Details: Shoulder elevation ltd to ~45 - past TSR LUE Deficits / Details: shoulder elevation ltd to ~75 Lower Extremity Assessment Lower Extremity Assessment: RLE deficits/detail RLE Deficits / Details: 3-/5 strength with hip flex to 90 AAROM and 25 abd AROM Cervical / Trunk Assessment Cervical / Trunk Assessment: Kyphotic   Balance    End of Session PT - End of Session Equipment Utilized During Treatment: Gait belt Activity Tolerance: Patient tolerated treatment well Patient  left: in chair;with call bell/phone within reach;with family/visitor present Nurse Communication:  Mobility status  GP     Malgorzata Albert 12/27/2012, 6:13 PM

## 2012-12-27 NOTE — Anesthesia Preprocedure Evaluation (Addendum)
Anesthesia Evaluation  Patient identified by MRN, date of birth, ID band Patient awake    Reviewed: Allergy & Precautions, H&P , NPO status , Patient's Chart, lab work & pertinent test results  Airway Mallampati: II TM Distance: >3 FB Neck ROM: Full    Dental  (+) Dental Advisory Given, Edentulous Upper and Edentulous Lower   Pulmonary shortness of breath and with exertion, pneumonia -, resolved, COPD COPD inhaler,  breath sounds clear to auscultation  Pulmonary exam normal       Cardiovascular hypertension, Pt. on medications + angina + CAD, + Cardiac Stents and + Peripheral Vascular Disease Rhythm:Regular Rate:Normal  Stress MPS 05/2012 Overall Impression:  Low risk stress nuclear study. Significant inferior bowel attenuation artifact. No ischemia.  LV Wall Motion:  NL LV Function; NL Wall Motion; EF 47%, Basal to mid inferior hypokinesis (may be due to being count poor - ie, very little radiotracer uptake to be traced by the software, therefore, EF may not be reduced - consider echo for more accurate measure of LV function)    Neuro/Psych negative psych ROS   GI/Hepatic Neg liver ROS, hiatal hernia, GERD-  Medicated,  Endo/Other  diabetes, Poorly Controlled, Type 2, Oral Hypoglycemic Agents  Renal/GU negative Renal ROS     Musculoskeletal  (+) Arthritis -, Rheumatoid disorders,    Abdominal   Peds  Hematology negative hematology ROS (+)   Anesthesia Other Findings   Reproductive/Obstetrics                          Anesthesia Physical  Anesthesia Plan  ASA: III  Anesthesia Plan: General   Post-op Pain Management:    Induction: Intravenous  Airway Management Planned: Oral ETT and LMA  Additional Equipment:   Intra-op Plan:   Post-operative Plan: Extubation in OR  Informed Consent: I have reviewed the patients History and Physical, chart, labs and discussed the procedure including  the risks, benefits and alternatives for the proposed anesthesia with the patient or authorized representative who has indicated his/her understanding and acceptance.   Dental advisory given  Plan Discussed with: CRNA  Anesthesia Plan Comments:        Anesthesia Quick Evaluation

## 2012-12-27 NOTE — Progress Notes (Signed)
ANTICOAGULATION CONSULT NOTE - Initial Consult  Pharmacy Consult for Coumadin Indication: DVT  Allergies  Allergen Reactions  . Codeine Nausea And Vomiting, Swelling and Rash  . Hydrocodone Nausea And Vomiting, Swelling and Rash    "mouth peeled"  . Morphine And Related Other (See Comments)    hallucinations  . Other Nausea And Vomiting    Most pain meds other than tramadol. Tylenol, Toradol, and Robaxin are ok.    . Penicillins Hives  . Darvocet [Propoxyphene-Acetaminophen] Hives and Nausea And Vomiting  . Demerol [Meperidine] Nausea And Vomiting  . Statins Other (See Comments)    INTOLERANT OF STATINS - messes up her legs and has muscle aches  . Tape Other (See Comments)    Needs paper tape - skin very fragile    Patient Measurements: Height: 5\' 1"  (154.9 cm) Weight: 116 lb (52.617 kg) IBW/kg (Calculated) : 47.8   Vital Signs: Temp: 97.5 F (36.4 C) (11/07 1410) Temp src: Oral (11/07 1410) BP: 127/64 mmHg (11/07 1410) Pulse Rate: 80 (11/07 1410)  Labs:  Recent Labs  12/25/12 1450  LABPROT 14.3  INR 1.13    Estimated Creatinine Clearance: 42.3 ml/min (by C-G formula based on Cr of 0.56).   Medical History: Past Medical History  Diagnosis Date  . History of  Angina     Treated with PCI in 2004; no active Symptoms.  . Hiatal hernia   . GERD (gastroesophageal reflux disease)   . Iron deficiency anemia   . Barrett esophagus   . Osteoporosis   . HLD (hyperlipidemia)     Statin intolerant  . Internal hemorrhoids   . Diverticulosis   . Pain     RIGHT SHOULDER WITH LIMITED ROM--STATES PREVIOUS RT TOTAL SHOULDDER REPLACEMENT-BUT STILL HAS PROBLEMS WITH SHOULDER  . CAD S/P percutaneous coronary angioplasty 2004    PCI-RCA (2.33mm x 12mm Taxus DES)& Cx (2.75 mm x 14 mm Cypher DES)  . PAD (peripheral artery disease) 1994    History of right iliac stenting, fem-fem bypass in 94; aortobifem bypass/ AAA repair - November 2004; latest ABIs of been roughly 1  bilaterally, followed by Dr. Arbie Cookey  . HTN (hypertension)     sometimes  . COPD (chronic obstructive pulmonary disease)     pt. denies having this  . Shortness of breath     MILD- WITH EXERTION. chronic  . Pneumonia     frequent  . Nocturia   . Headache(784.0)     hx of  . Osteoarthritis of both hips      status post multiple hip operations, recent fall with fracture.    [name] DJD (degenerative joint disease)   . Rheumatoid arthritis   . H/O blood clots     bilateral lower extremity    Medications:  Prescriptions prior to admission  Medication Sig Dispense Refill  . acetaminophen (TYLENOL ARTHRITIS PAIN) 650 MG CR tablet Take 650 mg by mouth every 8 (eight) hours as needed for pain.      [name] amLODipine (NORVASC) 5 MG tablet Take 5 mg by mouth daily as needed (For systolic blood pressure (SBP) greater than 130.).       [name] Calcium Carbonate-Vitamin D (CALTRATE 600+D) 600-400 MG-UNIT per tablet Take 1 tablet by mouth every evening.       . cholecalciferol (VITAMIN D) 1000 UNITS tablet Take 2,000 Units by mouth every morning.      . docusate sodium (COLACE) 100 MG capsule Take 100 mg by mouth every evening.      [name]  enoxaparin (LOVENOX) 80 MG/0.8ML injection Inject 80 mg into the skin daily. She is to take for 11 days starting on 12/22/12.      [name] escitalopram (LEXAPRO) 5 MG tablet Take 5 mg by mouth every evening.       . Flaxseed, Linseed, (FLAX SEED OIL PO) Take 1 tablet by mouth daily at 12 noon.       . hydroxychloroquine (PLAQUENIL) 200 MG tablet Take 200 mg by mouth 2 (two) times daily.       . isosorbide mononitrate (IMDUR) 30 MG 24 hr tablet Take 30 mg by mouth every morning.      . Linaclotide (LINZESS) 145 MCG CAPS capsule Take 145 mcg by mouth every morning.       . neomycin-bacitracin-polymyxin (NEOSPORIN) ointment Apply 1 application topically daily as needed (For cuts and scratches - has very sensitive skin.).      [name] omeprazole (PRILOSEC OTC) 20 MG tablet Take 20 mg by mouth every  morning.       . polyvinyl alcohol (LIQUIFILM TEARS) 1.4 % ophthalmic solution Place 1 drop into both eyes as needed (Dry eyes.).  15 mL    . traMADol (ULTRAM) 50 MG tablet Take 50 mg by mouth every 6 (six) hours as needed for pain.      . vitamin C (ASCORBIC ACID) 500 MG tablet Take 500 mg by mouth daily at 12 noon.       . bisacodyl (DULCOLAX) 5 MG EC tablet Take 5 mg by mouth daily as needed for constipation.       . Camphor-Menthol-Methyl Sal (SALONPAS) 1.2-5.7-6.3 % PTCH Place 1 patch onto the skin daily as needed (For pain.).      [name] Docosanol (ABREVA) 10 % CREA Apply 1 application topically daily as needed (For sores on mouth.).      [name] methocarbamol (ROBAXIN) 500 MG tablet Take 500 mg by mouth 3 (three) times daily as needed (For muscle spasms.).       [name] Multiple Vitamin (MULTIVITAMIN WITH MINERALS) TABS tablet Take 1 tablet by mouth daily at 12 noon.      . promethazine (PHENERGAN) 12.5 MG tablet Take 12.5 mg by mouth every 6 (six) hours as needed for nausea (Patient takes with Tramadol).      [name] sulfaSALAzine (AZULFIDINE) 500 MG tablet Take 500 mg by mouth every evening.      . warfarin (COUMADIN) 3 MG tablet Take 3 mg by mouth See admin instructions. She is to take last two doses on 12/20/12 and 12/21/12.        Assessment: 79yo F on chronic Coumadin for DVT. Pharmacy is asked to dose Coumadin 12/27/12 s/p R hip bursectomy and compression screw removal. She was bridged with Lovenox 1.5mg /kg q24h pre-op and also ordered post-op. Most recent Coumadin regimen was reportedly 3mg  daily. Pre-op INR was 1.13 on 12/25/12.  Goal of Therapy:  INR 2-3 Monitor platelets by anticoagulation protocol: Yes   Plan:   Give Coumadin 4mg  tonight.  Lovenox 80mg  SQ q24h as ordered until INR therapeutic.  Check PT/INR and CBC daily.  Charolotte Eke, PharmD, pager [phone]. 12/27/2012,2:33 PM.

## 2012-12-27 NOTE — Anesthesia Postprocedure Evaluation (Signed)
Anesthesia Post Note  Patient: [name]  Procedure(s) Performed: Procedure(s) (LRB): RIGHT HIP BURSECTOMY AND COMPRESSION SCREW REMOVAL (Right)  Anesthesia type: General  Patient location: PACU  Post pain: Pain level controlled  Post assessment: Post-op Vital signs reviewed  Last Vitals: BP 127/64  Pulse 80  Temp(Src) 36.4 C (Oral)  Resp 16  Ht 5\' 1"  (1.549 m)  Wt 116 lb (52.617 kg)  BMI 21.93 kg/m2  SpO2 100%  Post vital signs: Reviewed  Level of consciousness: sedated  Complications: No apparent anesthesia complications

## 2012-12-27 NOTE — Transfer of Care (Signed)
Immediate Anesthesia Transfer of Care Note  Patient: [name]  Procedure(s) Performed: Procedure(s): RIGHT HIP BURSECTOMY AND COMPRESSION SCREW REMOVAL (Right)  Patient Location: PACU  Anesthesia Type:General  Level of Consciousness: awake and patient cooperative  Airway & Oxygen Therapy: Patient Spontanous Breathing and Patient connected to face mask oxygen  Post-op Assessment: Report given to PACU RN and Post -op Vital signs reviewed and stable  Post vital signs: Reviewed and stable  Complications: No apparent anesthesia complications

## 2012-12-27 NOTE — Preoperative (Signed)
Beta Blockers   Reason not to administer Beta Blockers:Not Applicable 

## 2012-12-27 NOTE — Progress Notes (Signed)
Pharmacy - Brief Note  Due to multiple allergies to narcotics and after discussion with the patient, her daughter and Dr. Magnus Ivan, will use Morphine, Tramadol and Robaxin post-op for pain control. Orders for Percocet and Vicodin were dc'd.   Charolotte Eke, PharmD, pager [phone]. 12/27/2012,2:07 PM.

## 2012-12-28 LAB — PROTIME-INR
INR: 1.11 (ref 0.00–1.49)
Prothrombin Time: 14.1 seconds (ref 11.6–15.2)

## 2012-12-28 MED ORDER — WARFARIN SODIUM 4 MG PO TABS
4.0000 mg | ORAL_TABLET | Freq: Once | ORAL | Status: AC
  Administered 2012-12-28: 4 mg via ORAL
  Filled 2012-12-28: qty 1

## 2012-12-28 NOTE — Op Note (Signed)
NAMEELVY, [name] NO.:  [id number]  MEDICAL RECORD NO.:  [id number]  LOCATION:  1610                         FACILITY:  [facility]  PHYSICIAN:  Vanita Panda. Magnus Ivan, M.D.DATE OF BIRTH:  1931-02-13  DATE OF PROCEDURE:  12/27/2012 DATE OF DISCHARGE:                              OPERATIVE REPORT   PREOPERATIVE DIAGNOSES: 1. Painful retained prominent hardware, right hip. 2. Chronic trochanteric bursitis, right hip.  POSTOPERATIVE DIAGNOSES: 1. Painful retained prominent hardware, right hip. 2. Chronic trochanteric bursitis, right hip.  PROCEDURE: 1. Hardware removal of the retained compression screw, right hip. 2. Local bursectomy, right hip trochanteric area.  SURGEON:  Vanita Panda. Magnus Ivan, MD  ANESTHESIA: 1. General 2. Local.  BLOOD LOSS:  Less than 50 mL.  COMPLICATIONS:  None.  INDICATIONS:  [name] is a very pleasant 79 year old female, who in February of this year sustained a right intertrochanteric/subtrochanteric femur fracture.  She underwent compression nail and hip screw placement.  Now she is completely collapsed.  The components of the fracture and hip replacement seemed to be in place and looks like she has healed the fracture, however, the compression part of the lag screw has backed out.  It is painful and prominence caused local bursitis.  We recommended she undergo bursectomy of the right hip area as well as removal of the compression screw.  This is also way to be able to assess the bones to see if she is healed.  She has been in considerable amount of pain.  She is a 79 year old with very weak bone.  PROCEDURE DESCRIPTION:  After informed consent was obtained, appropriate right hip was marked.  She was brought to the operating room and placed supine on the operating table.  General anesthesia was obtained.  She was then turned into a lateral decubitus position with the right operative hip up.  We prepped the right hip  with DuraPrep and sterile drapes.  Of note, an axillary roll was placed as well and there was appropriate positioning of the head and neck and padding of the down on operative areas.  A time-out was called and she was identified as correct patient, correct right hip.  I then made incision directly over the trochanter on the right hip, carrying it proximally and distally.  I dissected down and divided the iliotibial band.  I did find a fluid collection inferior, but no evidence of infection.  I was able to remove the compression screw easily with the head backed out.  I then removed the bursa in this area and smoothed out the lateral cortex of bone. From a gross standpoint, I could not see if there was any motion of the fracture and there is still an intramedullary nail in place with the lag screw, which were left alone.  We then irrigated the soft tissues with normal saline solution.  I closed the iliotibial band with #1 Vicryl suture followed by 2-0 Vicryl in subcutaneous tissue, and staples on the skin.  We infiltrated the incision and the deep tissue with plain Marcaine.  A well-padded sterile dressing was applied and she was rolled in supine position, awakened, extubated, and taken to recovery room in  stable condition.  All final counts were correct and there were no complications noted.  Postoperatively we will get her up with physical therapy.  We will weight bear as tolerated on her right hip and discharge her likely tomorrow.     Vanita Panda. Magnus Ivan, M.D.     CYB/MEDQ  D:  12/27/2012  T:  12/28/2012  Job:  [id number]

## 2012-12-28 NOTE — Progress Notes (Signed)
ANTICOAGULATION CONSULT NOTE - Follow-up  Pharmacy Consult for Coumadin Indication: DVT  Allergies  Allergen Reactions  . Codeine Nausea And Vomiting, Swelling and Rash  . Hydrocodone Nausea And Vomiting, Swelling and Rash    "mouth peeled"  . Morphine And Related Other (See Comments)    hallucinations  . Other Nausea And Vomiting    Most pain meds other than tramadol. Tylenol, Toradol, and Robaxin are ok.    . Penicillins Hives  . Darvocet [Propoxyphene-Acetaminophen] Hives and Nausea And Vomiting  . Demerol [Meperidine] Nausea And Vomiting  . Statins Other (See Comments)    INTOLERANT OF STATINS - messes up her legs and has muscle aches  . Tape Other (See Comments)    Needs paper tape - skin very fragile    Patient Measurements: Height: 5\' 1"  (154.9 cm) Weight: 116 lb (52.617 kg) IBW/kg (Calculated) : 47.8   Vital Signs: Temp: 99.1 F (37.3 C) (11/08 0500) Temp src: Oral (11/08 0500) BP: 107/60 mmHg (11/08 0500) Pulse Rate: 82 (11/08 0500)  Labs:  Recent Labs  12/25/12 1450  LABPROT 14.3  INR 1.13    Estimated Creatinine Clearance: 42.3 ml/min (by C-G formula based on Cr of 0.56).    Assessment: 79yo F on chronic Coumadin for DVT. Pharmacy is asked to dose Coumadin s/p R hip bursectomy and compression screw removal performed 12/27/12. Pt was bridged with Lovenox 1.5mg /kg q24h pre-op and also ordered post-op, weight documented as 52.6kg. Most recent Coumadin regimen was reportedly 3mg  daily. Pre-op INR was 1.13 on 12/25/12.   No bleeding has been reported  No CBC or INR for today  Expect INR to be SUB-therapeutic at this point.  Regular diet ordered, no PO intake charted  Goal of Therapy:  INR 2-3 Monitor platelets by anticoagulation protocol: Yes   Plan:   Give Coumadin 4mg  again tonight.  Lovenox 80mg  SQ q24h as ordered until INR therapeutic.  Check PT/INR and CBC daily.  Thank you for the consult.  Tomi Bamberger, PharmD Clinical  Pharmacist Pager: [phone] Pharmacy: [phone] 12/28/2012 9:40 AM

## 2012-12-28 NOTE — Progress Notes (Signed)
Physical Therapy Treatment Patient Details Name: [name] MRN: [id number] DOB: 1930-05-20 Today's Date: 12/28/2012 Time: 4098-1191 PT Time Calculation (min): 28 min  PT Assessment / Plan / Recommendation  History of Present Illness     PT Comments     Follow Up Recommendations  Home health PT     Does the patient have the potential to tolerate intense rehabilitation     Barriers to Discharge        Equipment Recommendations  Other (comment)    Recommendations for Other Services OT consult  Frequency Min 5X/week   Progress towards PT Goals Progress towards PT goals: Progressing toward goals  Plan Current plan remains appropriate    Precautions / Restrictions Precautions Precautions: Fall Precaution Comments: Pt requires shoes on 2* leg length descrepancy Restrictions Weight Bearing Restrictions: No Other Position/Activity Restrictions: WBAT   Pertinent Vitals/Pain 4/10; premed, ice pack provided    Mobility  Bed Mobility Bed Mobility: Supine to Sit Supine to Sit: 4: Min assist;With rails Details for Bed Mobility Assistance: cues for sequence and use of L LE to self assist  Transfers Transfers: Sit to Stand;Stand to Sit Sit to Stand: 4: Min assist Stand to Sit: 4: Min assist Details for Transfer Assistance: cues for use of UEs to self assist  Ambulation/Gait Ambulation/Gait Assistance: 4: Min assist Ambulation Distance (Feet): 400 Feet Assistive device: Rolling walker Ambulation/Gait Assistance Details: cues for position from RW and pace Gait Pattern: Step-to pattern;Step-through pattern;Antalgic;Trunk flexed;Shuffle    Exercises General Exercises - Lower Extremity Ankle Circles/Pumps: AROM;Both;15 reps;Supine Quad Sets: AROM;10 reps;Supine;Both Heel Slides: AAROM;Supine;Right;15 reps Hip ABduction/ADduction: AAROM;15 reps;Supine;Right   PT Diagnosis:    PT Problem List:   PT Treatment Interventions:     PT Goals (current goals can now be found in  the care plan section) Acute Rehab PT Goals Patient Stated Goal: Walk with decreased pain PT Goal Formulation: With patient Time For Goal Achievement: 01/03/13 Potential to Achieve Goals: Good  Visit Information  Last PT Received On: 12/28/12 Assistance Needed: +1    Subjective Data  Subjective: Its sore but it hurts less to walk than it did before surgery.  I can put more wt on it Patient Stated Goal: Walk with decreased pain   Cognition  Cognition Arousal/Alertness: Awake/alert Behavior During Therapy: WFL for tasks assessed/performed Overall Cognitive Status: Within Functional Limits for tasks assessed    Balance     End of Session PT - End of Session Equipment Utilized During Treatment: Gait belt Activity Tolerance: Patient tolerated treatment well Patient left: in chair;with call bell/phone within reach;with family/visitor present Nurse Communication: Mobility status   GP     Delana Manganello 12/28/2012, 12:16 PM

## 2012-12-28 NOTE — Progress Notes (Signed)
Subjective: 1 Day Post-Op Procedure(s) (LRB): RIGHT HIP BURSECTOMY AND COMPRESSION SCREW REMOVAL (Right) Patient reports pain as mild.    Objective: Vital signs in last 24 hours: Temp:  [97.5 F (36.4 C)-99.1 F (37.3 C)] 99.1 F (37.3 C) (11/08 0500) Pulse Rate:  [66-83] 82 (11/08 0500) Resp:  [13-18] 16 (11/08 0500) BP: (105-221)/(51-96) 107/60 mmHg (11/08 0500) SpO2:  [95 %-100 %] 98 % (11/08 0500) Weight:  [52.617 kg (116 lb)] 52.617 kg (116 lb) (11/07 1313)  Intake/Output from previous day: 11/07 0701 - 11/08 0700 In: 2213.8 [I.V.:2063.8; IV Piggyback:150] Out: 750 [Urine:750] Intake/Output this shift: Total I/O In: -  Out: 150 [Urine:150]  No results found for this basename: HGB,  in the last 72 hours No results found for this basename: WBC, RBC, HCT, PLT,  in the last 72 hours No results found for this basename: NA, K, CL, CO2, BUN, CREATININE, GLUCOSE, CALCIUM,  in the last 72 hours  Recent Labs  12/25/12 1450  INR 1.13    Sensation intact distally Intact pulses distally Dorsiflexion/Plantar flexion intact Incision: scant drainage  Assessment/Plan: 1 Day Post-Op Procedure(s) (LRB): RIGHT HIP BURSECTOMY AND COMPRESSION SCREW REMOVAL (Right) Up with therapy Will keep here until INR stabilizes  BLACKMAN,CHRISTOPHER Y 12/28/2012, 9:36 AM

## 2012-12-29 LAB — CBC
HCT: 30.5 % — ABNORMAL LOW (ref 36.0–46.0)
Hemoglobin: 9.9 g/dL — ABNORMAL LOW (ref 12.0–15.0)
MCH: 29.7 pg (ref 26.0–34.0)
MCHC: 32.5 g/dL (ref 30.0–36.0)
MCV: 91.6 fL (ref 78.0–100.0)
Platelets: 142 10*3/uL — ABNORMAL LOW (ref 150–400)

## 2012-12-29 MED ORDER — WARFARIN SODIUM 7.5 MG PO TABS
7.5000 mg | ORAL_TABLET | Freq: Once | ORAL | Status: AC
  Administered 2012-12-29: 7.5 mg via ORAL
  Filled 2012-12-29: qty 1

## 2012-12-29 MED ORDER — OMEPRAZOLE 20 MG PO CPDR
20.0000 mg | DELAYED_RELEASE_CAPSULE | Freq: Every day | ORAL | Status: DC
  Administered 2012-12-29 – 2013-01-01 (×4): 20 mg via ORAL
  Filled 2012-12-29 (×4): qty 1

## 2012-12-29 NOTE — Progress Notes (Signed)
Physical Therapy Treatment Patient Details Name: [name] MRN: [id number] DOB: 24-May-1930 Today's Date: 12/29/2012 Time: 4098-1191 PT Time Calculation (min): 23 min  PT Assessment / Plan / Recommendation  History of Present Illness     PT Comments   Pt ambulating in halls and in bathroom to use mirror to comb hair.  Increased stability with pt demonstrating increased confidence in R LE.  Follow Up Recommendations  Home health PT     Does the patient have the potential to tolerate intense rehabilitation     Barriers to Discharge        Equipment Recommendations       Recommendations for Other Services OT consult  Frequency Min 5X/week   Progress towards PT Goals Progress towards PT goals: Progressing toward goals  Plan Current plan remains appropriate    Precautions / Restrictions Precautions Precautions: Fall Precaution Comments: Pt requires shoes on 2* leg length descrepancy Restrictions Weight Bearing Restrictions: No Other Position/Activity Restrictions: WBAT   Pertinent Vitals/Pain Min c/o pain    Mobility  Bed Mobility Bed Mobility: Sit to Supine Sit to Supine: 4: Min guard Details for Bed Mobility Assistance: cues for sequence and use of L LE to self assist  Transfers Transfers: Sit to Stand;Stand to Sit Sit to Stand: 4: Min guard;From chair/3-in-1 Stand to Sit: 4: Min guard;To bed Details for Transfer Assistance: cues for use of UEs to self assist  Ambulation/Gait Ambulation/Gait Assistance: 4: Min guard Ambulation Distance (Feet): 475 Feet Assistive device: Rolling walker Ambulation/Gait Assistance Details: min cues for position from RW Gait Pattern: Step-to pattern;Step-through pattern;Antalgic;Trunk flexed;Shuffle Stairs: Yes Stairs Assistance: 4: Min assist Stairs Assistance Details (indicate cue type and reason): min cues for sequence Stair Management Technique: Two rails;Forwards;Step to pattern Number of Stairs: 2    Exercises     PT  Diagnosis:    PT Problem List:   PT Treatment Interventions:     PT Goals (current goals can now be found in the care plan section) Acute Rehab PT Goals Patient Stated Goal: Walk with decreased pain PT Goal Formulation: With patient Time For Goal Achievement: 01/03/13 Potential to Achieve Goals: Good  Visit Information  Last PT Received On: 12/29/12 Assistance Needed: +1    Subjective Data  Subjective: Its sore but it hurts less to walk than it did before surgery.  I can put more wt on it Patient Stated Goal: Walk with decreased pain   Cognition  Cognition Arousal/Alertness: Awake/alert Behavior During Therapy: WFL for tasks assessed/performed Overall Cognitive Status: Within Functional Limits for tasks assessed    Balance     End of Session PT - End of Session Equipment Utilized During Treatment: Gait belt Activity Tolerance: Patient tolerated treatment well Patient left: in bed;with call bell/phone within reach Nurse Communication: Mobility status   GP     [name] 12/29/2012, 11:27 AM

## 2012-12-29 NOTE — Progress Notes (Signed)
Subjective: 2 Days Post-Op Procedure(s) (LRB): RIGHT HIP BURSECTOMY AND COMPRESSION SCREW REMOVAL (Right) Patient reports pain as moderate.  Started back on coumadin.  Working with therapy  Objective: Vital signs in last 24 hours: Temp:  [97.9 F (36.6 C)-99.1 F (37.3 C)] 99.1 F (37.3 C) (11/09 0546) Pulse Rate:  [76-81] 80 (11/09 0546) Resp:  [14-16] 16 (11/09 0546) BP: (101-135)/(53-71) 127/71 mmHg (11/09 0546) SpO2:  [93 %-95 %] 95 % (11/09 0546)  Intake/Output from previous day: 11/08 0701 - 11/09 0700 In: 780 [P.O.:780] Out: 1075 [Urine:1075] Intake/Output this shift:     Recent Labs  12/29/12 0428  HGB 9.9*    Recent Labs  12/29/12 0428  WBC 6.6  RBC 3.33*  HCT 30.5*  PLT 142*   No results found for this basename: NA, K, CL, CO2, BUN, CREATININE, GLUCOSE, CALCIUM,  in the last 72 hours  Recent Labs  12/28/12 1017 12/29/12 0428  INR 1.11 1.17    Sensation intact distally Intact pulses distally Dorsiflexion/Plantar flexion intact Incision: scant drainage  Assessment/Plan: 2 Days Post-Op Procedure(s) (LRB): RIGHT HIP BURSECTOMY AND COMPRESSION SCREW REMOVAL (Right) Up with therapy Will discharge once INR is up and can stop Lovenox.  [name] Y 12/29/2012, 7:26 AM

## 2012-12-29 NOTE — Progress Notes (Signed)
ANTICOAGULATION CONSULT NOTE - Follow-up  Pharmacy Consult for Coumadin Indication: DVT  Allergies  Allergen Reactions  . Codeine Nausea And Vomiting, Swelling and Rash  . Hydrocodone Nausea And Vomiting, Swelling and Rash    "mouth peeled"  . Morphine And Related Other (See Comments)    hallucinations  . Other Nausea And Vomiting    Most pain meds other than tramadol. Tylenol, Toradol, and Robaxin are ok.    . Penicillins Hives  . Darvocet [Propoxyphene-Acetaminophen] Hives and Nausea And Vomiting  . Demerol [Meperidine] Nausea And Vomiting  . Statins Other (See Comments)    INTOLERANT OF STATINS - messes up her legs and has muscle aches  . Tape Other (See Comments)    Needs paper tape - skin very fragile    Patient Measurements: Height: 5\' 1"  (154.9 cm) Weight: 116 lb (52.617 kg) IBW/kg (Calculated) : 47.8   Vital Signs: Temp: 99.1 F (37.3 C) (11/09 0546) Temp src: Oral (11/09 0546) BP: 127/71 mmHg (11/09 0546) Pulse Rate: 80 (11/09 0546)  Labs:  Recent Labs  12/28/12 1017 12/29/12 0428  HGB  --  9.9*  HCT  --  30.5*  PLT  --  142*  LABPROT 14.1 14.7  INR 1.11 1.17    Estimated Creatinine Clearance: 42.3 ml/min (by C-G formula based on Cr of 0.56).    Assessment: 79yo F on chronic Coumadin for DVT. Pharmacy is asked to dose Coumadin s/p R hip bursectomy and compression screw removal performed 12/27/12. Pt was bridged with Lovenox 1.5mg /kg q24h pre-op and also ordered post-op, weight documented as 52.6kg. Most recent Coumadin regimen was reportedly 3mg  daily. Pre-op INR was 1.13 on 12/25/12.   No bleeding has been reported  INR subtherapeutic at 1.17 from 1.11 after 4mg  x 2  Hgb down post-op to 9.9, plts decreased to 142K  Regular diet ordered, 50-100% of meals eaten  Noted patient to remain inpatient until INR in therapeutic range and Lovenox has been d/c'd  Goal of Therapy:  INR 2-3 Monitor platelets by anticoagulation protocol: Yes   Plan:    Give Coumadin 7.5mg  again tonight.  Lovenox 80mg  SQ q24h as ordered until INR therapeutic.  Check PT/INR and CBC daily.  Thank you for the consult.  Tomi Bamberger, PharmD Clinical Pharmacist Pager: [phone] Pharmacy: [phone] 12/29/2012 9:42 AM

## 2012-12-30 ENCOUNTER — Encounter (HOSPITAL_COMMUNITY): Payer: Self-pay | Admitting: Orthopaedic Surgery

## 2012-12-30 LAB — CBC
HCT: 30.2 % — ABNORMAL LOW (ref 36.0–46.0)
Hemoglobin: 10.1 g/dL — ABNORMAL LOW (ref 12.0–15.0)
MCH: 30 pg (ref 26.0–34.0)
MCHC: 33.4 g/dL (ref 30.0–36.0)

## 2012-12-30 LAB — PROTIME-INR: INR: 1.32 (ref 0.00–1.49)

## 2012-12-30 MED ORDER — WARFARIN SODIUM 7.5 MG PO TABS
7.5000 mg | ORAL_TABLET | Freq: Once | ORAL | Status: AC
  Administered 2012-12-30: 7.5 mg via ORAL
  Filled 2012-12-30: qty 1

## 2012-12-30 MED ORDER — FLUCONAZOLE 150 MG PO TABS
150.0000 mg | ORAL_TABLET | Freq: Once | ORAL | Status: AC
  Administered 2012-12-30: 150 mg via ORAL
  Filled 2012-12-30: qty 1

## 2012-12-30 MED ORDER — MAGIC MOUTHWASH
5.0000 mL | Freq: Two times a day (BID) | ORAL | Status: DC | PRN
  Filled 2012-12-30: qty 5

## 2012-12-30 MED ORDER — LIP MEDEX EX OINT
TOPICAL_OINTMENT | CUTANEOUS | Status: AC
  Administered 2012-12-30: 16:00:00
  Filled 2012-12-30: qty 7

## 2012-12-30 NOTE — Progress Notes (Signed)
ANTICOAGULATION CONSULT NOTE - Follow-up  Pharmacy Consult for Coumadin Indication: DVT  Allergies  Allergen Reactions  . Codeine Nausea And Vomiting, Swelling and Rash  . Hydrocodone Nausea And Vomiting, Swelling and Rash    "mouth peeled"  . Morphine And Related Other (See Comments)    hallucinations  . Other Nausea And Vomiting    Most pain meds other than tramadol. Tylenol, Toradol, and Robaxin are ok.    . Penicillins Hives  . Darvocet [Propoxyphene-Acetaminophen] Hives and Nausea And Vomiting  . Demerol [Meperidine] Nausea And Vomiting  . Statins Other (See Comments)    INTOLERANT OF STATINS - messes up her legs and has muscle aches  . Tape Other (See Comments)    Needs paper tape - skin very fragile   Patient Measurements: Height: 5\' 1"  (154.9 cm) Weight: 116 lb (52.617 kg) IBW/kg (Calculated) : 47.8  Labs:  Recent Labs  12/28/12 1017 12/29/12 0428 12/30/12 0458  HGB  --  9.9* 10.1*  HCT  --  30.5* 30.2*  PLT  --  142* 139*  LABPROT 14.1 14.7 16.1*  INR 1.11 1.17 1.32   Assessment: 79yo F on chronic Coumadin for DVT. Pharmacy is asked to dose Coumadin s/p R hip bursectomy and compression screw removal performed 12/27/12. Pt was bridged with Lovenox 1.5mg /kg q24h pre-op and also ordered post-op, weight documented as 52.6kg. Most recent Coumadin regimen was reportedly 3mg  daily. Pre-op INR was 1.13 on 12/25/12.   No bleeding has been reported  INR subtherapeutic at 1.32 after doses of 4,4,7.5mg  Warfarin  Hgb 10.1, plts decreased to 139K  Regular diet ordered, 50-100% of meals eaten  Noted patient to remain inpatient until INR in therapeutic range (>2) and Lovenox has been d/c'd  Goal of Therapy:  INR 2-3 Monitor platelets by anticoagulation protocol: Yes   Plan:   Give Coumadin 7.5mg  again tonight.  Lovenox 80mg  SQ q24h as ordered until INR therapeutic.  Check PT/INR and CBC daily.  Thank you for the consult.  Otho Bellows PharmD Pager  [phone] 12/30/2012, 8:21 AM

## 2012-12-30 NOTE — Progress Notes (Signed)
Physical Therapy Treatment Patient Details Name: [name] MRN: [id number] DOB: [birth year] Today's Date: 12/30/2012 Time: 1125-1205 PT Time Calculation (min): 40 min  PT Assessment / Plan / Recommendation  History of Present Illness     PT Comments     Follow Up Recommendations  Home health PT     Does the patient have the potential to tolerate intense rehabilitation     Barriers to Discharge        Equipment Recommendations       Recommendations for Other Services OT consult  Frequency Min 5X/week   Progress towards PT Goals Progress towards PT goals: Progressing toward goals  Plan Current plan remains appropriate    Precautions / Restrictions Precautions Precautions: Fall Precaution Comments: Pt requires shoes on 2* leg length descrepancy Restrictions Weight Bearing Restrictions: No Other Position/Activity Restrictions: WBAT   Pertinent Vitals/Pain Min c/o pain    Mobility  Bed Mobility Bed Mobility: Supine to Sit;Sit to Supine Supine to Sit: 5: Supervision Sit to Supine: 5: Supervision Transfers Transfers: Sit to Stand;Stand to Sit Sit to Stand: 5: Supervision Stand to Sit: 5: Supervision Details for Transfer Assistance: cues for use of UEs to self assist  Ambulation/Gait Ambulation/Gait Assistance: 4: Min guard Ambulation Distance (Feet): 450 Feet Assistive device: Rolling walker;Rollator Ambulation/Gait Assistance Details: min cues for position from RW; instructed on brakes on Rollator Gait Pattern: Step-through pattern Stairs Assistance: 4: Min assist Stair Management Technique: Two rails;Forwards;Step to pattern Number of Stairs: 2    Exercises General Exercises - Lower Extremity Ankle Circles/Pumps: AROM;Both;15 reps;Supine Quad Sets: AROM;10 reps;Supine;Both Gluteal Sets: AROM;Both;10 reps;Supine Long Arc Quad: AROM;15 reps;Supine;Both Heel Slides: AAROM;Supine;Right;20 reps Hip ABduction/ADduction: AAROM;Supine;Right;AROM;20 reps   PT  Diagnosis:    PT Problem List:   PT Treatment Interventions:     PT Goals (current goals can now be found in the care plan section) Acute Rehab PT Goals Patient Stated Goal: Walk with decreased pain PT Goal Formulation: With patient Time For Goal Achievement: 01/03/13 Potential to Achieve Goals: Good  Visit Information  Last PT Received On: 12/30/12 Assistance Needed: +1    Subjective Data  Subjective: ITs just a little sore Patient Stated Goal: Walk with decreased pain   Cognition  Cognition Arousal/Alertness: Awake/alert Behavior During Therapy: WFL for tasks assessed/performed Overall Cognitive Status: Within Functional Limits for tasks assessed    Balance     End of Session PT - End of Session Equipment Utilized During Treatment: Gait belt Activity Tolerance: Patient tolerated treatment well Patient left: in bed;with call bell/phone within reach Nurse Communication: Mobility status   GP     [name] 12/30/2012, 12:38 PM

## 2012-12-30 NOTE — Progress Notes (Signed)
Subjective: 3 Days Post-Op Procedure(s) (LRB): RIGHT HIP BURSECTOMY AND COMPRESSION SCREW REMOVAL (Right) Patient reports pain as mild.    Objective: Vital signs in last 24 hours: Temp:  [98.4 F (36.9 C)-99.6 F (37.6 C)] 98.4 F (36.9 C) (11/10 0440) Pulse Rate:  [75-83] 79 (11/10 0440) Resp:  [15-16] 15 (11/10 0440) BP: (130-139)/(68-73) 132/70 mmHg (11/10 0440) SpO2:  [96 %-97 %] 97 % (11/10 0440)  Intake/Output from previous day: 11/09 0701 - 11/10 0700 In: 1150.8 [P.O.:720; I.V.:430.8] Out: 1301 [Urine:1300; Stool:1] Intake/Output this shift:     Recent Labs  12/29/12 0428 12/30/12 0458  HGB 9.9* 10.1*    Recent Labs  12/29/12 0428 12/30/12 0458  WBC 6.6 5.9  RBC 3.33* 3.37*  HCT 30.5* 30.2*  PLT 142* 139*   No results found for this basename: NA, K, CL, CO2, BUN, CREATININE, GLUCOSE, CALCIUM,  in the last 72 hours  Recent Labs  12/29/12 0428 12/30/12 0458  INR 1.17 1.32    Intact pulses distally Dorsiflexion/Plantar flexion intact Incision: scant drainage No cellulitis present Compartment soft  Assessment/Plan: 3 Days Post-Op Procedure(s) (LRB): RIGHT HIP BURSECTOMY AND COMPRESSION SCREW REMOVAL (Right) Up with therapy Can not send home until INR > 2  [name] 12/30/2012, 7:40 AM

## 2012-12-31 LAB — CBC
HCT: 29.5 % — ABNORMAL LOW (ref 36.0–46.0)
Hemoglobin: 9.9 g/dL — ABNORMAL LOW (ref 12.0–15.0)
MCHC: 33.6 g/dL (ref 30.0–36.0)
Platelets: 144 10*3/uL — ABNORMAL LOW (ref 150–400)

## 2012-12-31 LAB — PROTIME-INR
INR: 1.76 — ABNORMAL HIGH (ref 0.00–1.49)
Prothrombin Time: 20 seconds — ABNORMAL HIGH (ref 11.6–15.2)

## 2012-12-31 MED ORDER — WARFARIN SODIUM 7.5 MG PO TABS
7.5000 mg | ORAL_TABLET | Freq: Once | ORAL | Status: AC
  Administered 2012-12-31: 11:00:00 7.5 mg via ORAL
  Filled 2012-12-31: qty 1

## 2012-12-31 NOTE — Progress Notes (Signed)
ANTICOAGULATION CONSULT NOTE - Follow-up  Pharmacy Consult for Coumadin Indication: hx of DVT, chronic Warfarin  Allergies  Allergen Reactions  . Codeine Nausea And Vomiting, Swelling and Rash  . Hydrocodone Nausea And Vomiting, Swelling and Rash    "mouth peeled"  . Morphine And Related Other (See Comments)    hallucinations  . Other Nausea And Vomiting    Most pain meds other than tramadol. Tylenol, Toradol, and Robaxin are ok.    . Penicillins Hives  . Darvocet [Propoxyphene-Acetaminophen] Hives and Nausea And Vomiting  . Demerol [Meperidine] Nausea And Vomiting  . Statins Other (See Comments)    INTOLERANT OF STATINS - messes up her legs and has muscle aches  . Tape Other (See Comments)    Needs paper tape - skin very fragile   Patient Measurements: Height: 5\' 1"  (154.9 cm) Weight: 116 lb (52.617 kg) IBW/kg (Calculated) : 47.8  Labs:  Recent Labs  12/29/12 0428 12/30/12 0458 12/31/12 0413  HGB 9.9* 10.1* 9.9*  HCT 30.5* 30.2* 29.5*  PLT 142* 139* 144*  LABPROT 14.7 16.1* 20.0*  INR 1.17 1.32 1.76*   Assessment: 79yo F on chronic Coumadin for DVT. Pharmacy is asked to dose Coumadin s/p R hip bursectomy and compression screw removal performed 12/27/12. Pt was bridged with Lovenox 1.5mg /kg q24h pre-op and also ordered post-op, weight documented as 52.6kg. Most recent Coumadin regimen was reportedly 3mg  daily. Pre-op INR was 1.13 on 12/25/12.   No bleeding has been reported  INR subtherapeutic at 1.76 after doses of 4,4,7.5, 7.5mg  Warfarin  Anticipate therapeutic INR in am 11/12, discharge.  Hgb 9.9, plts 144K  Tolerating regular diet, 100%   Noted patient to remain inpatient until INR in therapeutic range (>2) and Lovenox has been d/c'd  Goal of Therapy:  INR 2-3 Monitor platelets by anticoagulation protocol: Yes   Plan:   Give Coumadin 7.5mg  again tonight.  Lovenox 80mg  SQ q24h as ordered until INR therapeutic.  Check PT/INR and CBC daily.  Thank  you for the consult.  Otho Bellows PharmD Pager [phone] 12/31/2012, 8:47 AM

## 2012-12-31 NOTE — Clinical Documentation Improvement (Signed)
THIS DOCUMENT IS NOT A PERMANENT PART OF THE MEDICAL RECORD  Please update your documentation with the medical record to reflect your response to this query. If you need help knowing how to do this please call [phone].  12/31/12  Dear Dr. Vanita Panda [facility]  In an effort to better capture your patient's severity of illness, reflect appropriate length of stay and utilization of resources, a review of the patient medical record has revealed the following indicators.    Based on your clinical judgment, please clarify and document in a progress note and/or discharge summary the clinical condition associated with the following supporting information:  In responding to this query please exercise your independent judgment.  The fact that a query is asked, does not imply that any particular answer is desired or expected.   Pt with abnormal H/H=9.9/29.5 s/p bursectomy, right hip trochanteric area.   Clarification Needed   Please clarify if the abnormal H/H can be further specified as one of the diagnoses listed below and document in pn or d/c summary.   Possible Clinical Conditions?   " Expected Acute Blood Loss Anemia  " Acute Blood Loss Anemia  " Acute on chronic blood loss anemia   " Other Condition________________  " Cannot Clinically Determine   Risk Factors:  Supporting Information:  Signs and Symptoms    Diagnostics: Component     Latest Ref Rng 12/29/2012 12/30/2012 12/31/2012  Hemoglobin     12.0 - 15.0 g/dL 9.9 (L) 09.8 (L) 9.9 (L)  HCT     36.0 - 46.0 % 30.5 (L) 30.2 (L) 29.5 (L)   Treatments: Serial H&H monitoring Medications  multivitamin with minerals tablet 1 tablet   cholecalciferol (VITAMIN D) tablet 2,000 Units  0.9 %  sodium chloride infusion      Reviewed: additional documentation in the medical record  Thank You,  Enis Slipper  RN, BSN, MSN/Inf, CCDS Clinical Documentation Specialist [facility] Pager:  [phone] / E-mail: [email or web address]  [phone] Health Information Management [place]

## 2012-12-31 NOTE — Progress Notes (Signed)
[facility]   [facility] is providing the following services: RW  If patient discharges after hours, please call [phone].   Renard Hamper 12/31/2012, 5:56 AM

## 2012-12-31 NOTE — Progress Notes (Signed)
Physical Therapy Treatment Patient Details Name: [name] MRN: [id number] DOB: [birth year] Today's Date: 12/31/2012 Time: 4098-1191 PT Time Calculation (min): 13 min  PT Assessment / Plan / Recommendation  History of Present Illness     PT Comments   Pt is currently mod I with bed level mobility and transfers and supervision with gait, therefore she has met all acute care goals.  PT to sign off at this time.    Follow Up Recommendations  Home health PT     Does the patient have the potential to tolerate intense rehabilitation     Barriers to Discharge        Equipment Recommendations       Recommendations for Other Services    Frequency Min 5X/week   Progress towards PT Goals Progress towards PT goals: Goals met/education completed, patient discharged from PT  Plan Current plan remains appropriate    Precautions / Restrictions Precautions Precautions: Fall Precaution Comments: Pt requires shoes on 2* leg length descrepancy Restrictions Weight Bearing Restrictions: No Other Position/Activity Restrictions: WBAT   Pertinent Vitals/Pain Pt states no pain with amb    Mobility  Bed Mobility Bed Mobility: Supine to Sit;Sit to Supine Supine to Sit: 6: Modified independent (Device/Increase time) Sit to Supine: 6: Modified independent (Device/Increase time) Transfers Transfers: Sit to Stand;Stand to Sit Sit to Stand: 6: Modified independent (Device/Increase time) Stand to Sit: 6: Modified independent (Device/Increase time) Ambulation/Gait Ambulation/Gait Assistance: 5: Supervision Ambulation Distance (Feet): 200 Feet Assistive device: Rolling walker Ambulation/Gait Assistance Details: Pt requires only min cues for upright posture.  Demonstrates safe technique with RW and states that is what she will be using at home, therefore used RW during session.  Gait Pattern: Step-through pattern    Exercises     PT Diagnosis:    PT Problem List:   PT Treatment  Interventions:     PT Goals (current goals can now be found in the care plan section) Acute Rehab PT Goals Patient Stated Goal: Walk with decreased pain PT Goal Formulation: With patient Time For Goal Achievement: 01/03/13 Potential to Achieve Goals: Good  Visit Information  Last PT Received On: 12/31/12 Assistance Needed: +1    Subjective Data  Subjective: I'm hoping to go home tomorrow Patient Stated Goal: Walk with decreased pain   Cognition  Cognition Arousal/Alertness: Awake/alert Behavior During Therapy: WFL for tasks assessed/performed Overall Cognitive Status: Within Functional Limits for tasks assessed    Balance     End of Session PT - End of Session Activity Tolerance: Patient tolerated treatment well Patient left: in bed;with call bell/phone within reach Nurse Communication: Mobility status   GP     Vista Deck 12/31/2012, 4:10 PM

## 2012-12-31 NOTE — Progress Notes (Signed)
12/31/2012 Dory Peru RN CCM [phone] Pt voices that she plans to go home where grand daughter will be caregiver. States she already has commode, RW and bath chair. States she does not think she will need HHpt services. CM will follow progress.

## 2012-12-31 NOTE — Progress Notes (Signed)
Patient ID: [name], female   DOB: 07-18-1930, 79 y.o.   MRN: [id number] [name] has a minimally [name] hemaglobin that she is tolerating well with out any difficulties at all.  She is asymptomatic from this with normal vitals. She is mobilizing well with no signs or symptoms of acute blood loss anemia.  Certainly, some of her blood loss is from here surgery and subsequent fluids given post-operatively.  She is only being kept in the hospital due to her fall risk and the expense of the lovenox.  Her family had to bring her to short-stay at [facility] every day last week prior to surgery to get her lovenox.  This is an unacceptable risk, thus I am keeping her in the hospital until tomorrow 11/12 when her INR will be therapeutic.

## 2012-12-31 NOTE — Progress Notes (Signed)
Subjective: 4 Days Post-Op Procedure(s) (LRB): RIGHT HIP BURSECTOMY AND COMPRESSION SCREW REMOVAL (Right) Patient reports pain as mild.  Ambulating well.  INR continuing to climb appropriately.  Objective: Vital signs in last 24 hours: Temp:  [98.6 F (37 C)-99.1 F (37.3 C)] 98.9 F (37.2 C) (11/11 0521) Pulse Rate:  [70-73] 73 (11/11 0521) Resp:  [16-18] 18 (11/11 0521) BP: (99-137)/(55-70) 137/55 mmHg (11/11 0521) SpO2:  [97 %-99 %] 97 % (11/11 0521)  Intake/Output from previous day: 11/10 0701 - 11/11 0700 In: 480 [P.O.:480] Out: -  Intake/Output this shift:     Recent Labs  12/29/12 0428 12/30/12 0458 12/31/12 0413  HGB 9.9* 10.1* 9.9*    Recent Labs  12/30/12 0458 12/31/12 0413  WBC 5.9 6.4  RBC 3.37* 3.25*  HCT 30.2* 29.5*  PLT 139* 144*   No results found for this basename: NA, K, CL, CO2, BUN, CREATININE, GLUCOSE, CALCIUM,  in the last 72 hours  Recent Labs  12/30/12 0458 12/31/12 0413  INR 1.32 1.76*    Sensation intact distally Intact pulses distally Dorsiflexion/Plantar flexion intact Incision: scant drainage No cellulitis present Compartment soft  Assessment/Plan: 4 Days Post-Op Procedure(s) (LRB): RIGHT HIP BURSECTOMY AND COMPRESSION SCREW REMOVAL (Right) Plan for discharge tomorrow Will likely be therapeutic with INR tomorrow.  Arina Torry Y 12/31/2012, 7:15 AM

## 2013-01-01 LAB — CBC
HCT: 30.4 % — ABNORMAL LOW (ref 36.0–46.0)
Hemoglobin: 10.2 g/dL — ABNORMAL LOW (ref 12.0–15.0)
MCHC: 33.6 g/dL (ref 30.0–36.0)
Platelets: 151 10*3/uL (ref 150–400)
RBC: 3.38 MIL/uL — ABNORMAL LOW (ref 3.87–5.11)

## 2013-01-01 LAB — PROTIME-INR
INR: 2.24 — ABNORMAL HIGH (ref 0.00–1.49)
Prothrombin Time: 24.1 seconds — ABNORMAL HIGH (ref 11.6–15.2)

## 2013-01-01 MED ORDER — WARFARIN SODIUM 3 MG PO TABS
3.0000 mg | ORAL_TABLET | Freq: Once | ORAL | Status: DC
  Filled 2013-01-01: qty 1

## 2013-01-01 MED ORDER — TRAMADOL HCL 50 MG PO TABS: 100.0000 mg | ORAL_TABLET | Freq: Four times a day (QID) | ORAL | Status: DC | PRN

## 2013-01-01 MED ORDER — METHOCARBAMOL 500 MG PO TABS: 500.0000 mg | ORAL_TABLET | Freq: Three times a day (TID) | ORAL | Status: DC | PRN

## 2013-01-01 NOTE — Discharge Summary (Signed)
Patient ID: [name] MRN: [id number] DOB/AGE: 1930-12-07 79 y.o.  Admit date: 12/27/2012 Discharge date: 01/01/2013  Admission Diagnoses:  Principal Problem:   Painful orthopaedic hardware right hip   Discharge Diagnoses:  S/p removal right hip painful hardware  Past Medical History  Diagnosis Date  . History of  Angina     Treated with PCI in 2004; no active Symptoms.  . Hiatal hernia   . GERD (gastroesophageal reflux disease)   . Iron deficiency anemia   . Barrett esophagus   . Osteoporosis   . HLD (hyperlipidemia)     Statin intolerant  . Internal hemorrhoids   . Diverticulosis   . Pain     RIGHT SHOULDER WITH LIMITED ROM--STATES PREVIOUS RT TOTAL SHOULDDER REPLACEMENT-BUT STILL HAS PROBLEMS WITH SHOULDER  . CAD S/P percutaneous coronary angioplasty 2004    PCI-RCA (2.64mm x 12mm Taxus DES)& Cx (2.75 mm x 14 mm Cypher DES)  . PAD (peripheral artery disease) 1994    History of right iliac stenting, fem-fem bypass in 94; aortobifem bypass/ AAA repair - November 2004; latest ABIs of been roughly 1 bilaterally, followed by Dr. Arbie Cookey  . HTN (hypertension)     sometimes  . COPD (chronic obstructive pulmonary disease)     pt. denies having this  . Shortness of breath     MILD- WITH EXERTION. chronic  . Pneumonia     frequent  . Nocturia   . Headache(784.0)     hx of  . Osteoarthritis of both hips      status post multiple hip operations, recent fall with fracture.    [name] DJD (degenerative joint disease)   . Rheumatoid arthritis   . H/O blood clots     bilateral lower extremity    Surgeries: Procedure(s): RIGHT HIP BURSECTOMY AND COMPRESSION SCREW REMOVAL on 12/27/2012   Consultants:    Discharged Condition: Improved  Hospital Course: [name] is an 79 y.o. female who was admitted 12/27/2012 for operative treatment ofPainful orthopaedic hardware. Patient has severe unremitting pain that affects sleep, daily activities, and work/hobbies. After pre-op  clearance the patient was taken to the operating room on 12/27/2012 and underwent  Procedure(s): RIGHT HIP BURSECTOMY AND COMPRESSION SCREW REMOVAL. Anticoagulation and fall risk prolonged patients stay.    Patient was given perioperative antibiotics: Anti-infectives   Start     Dose/Rate Route Frequency Ordered Stop   12/30/12 1530  fluconazole (DIFLUCAN) tablet 150 mg     150 mg Oral  Once 12/30/12 1452 12/30/12 1552   12/27/12 2200  hydroxychloroquine (PLAQUENIL) tablet 200 mg     200 mg Oral 2 times daily 12/27/12 1319     12/27/12 1500  ceFAZolin (ANCEF) IVPB 1 g/50 mL premix     1 g 100 mL/hr over 30 Minutes Intravenous Every 6 hours 12/27/12 1319 12/28/12 0239   12/27/12 0743  clindamycin (CLEOCIN) IVPB 900 mg     900 mg 100 mL/hr over 30 Minutes Intravenous On call to O.R. 12/27/12 0743 12/27/12 1110       Patient was given sequential compression devices, early ambulation, and chemoprophylaxis to prevent DVT.  Patient benefited maximally from hospital stay and there were no complications.    Recent vital signs: Patient Vitals for the past 24 hrs:  BP Temp Temp src Pulse Resp SpO2  01/01/13 0608 147/65 mmHg 98.6 F (37 C) Oral 66 16 98 %  12/31/12 2013 134/68 mmHg 98 F (36.7 C) Oral 67 16 99 %  12/31/12  1414 140/66 mmHg 98.3 F (36.8 C) Oral 77 18 98 %     Recent laboratory studies:  Recent Labs  12/31/12 0413 01/01/13 0420  WBC 6.4 4.8  HGB 9.9* 10.2*  HCT 29.5* 30.4*  PLT 144* 151  INR 1.76* 2.24*     Discharge Medications:     Medication List    STOP taking these medications       enoxaparin 80 MG/0.8ML injection  Commonly known as:  LOVENOX     TYLENOL ARTHRITIS PAIN 650 MG CR tablet  Generic drug:  acetaminophen      TAKE these medications       ABREVA 10 % Crea  Generic drug:  Docosanol  Apply 1 application topically daily as needed (For sores on mouth.).     amLODipine 5 MG tablet  Commonly known as:  NORVASC  Take 5 mg by mouth daily  as needed (For systolic blood pressure (SBP) greater than 130.).     bisacodyl 5 MG EC tablet  Commonly known as:  DULCOLAX  Take 5 mg by mouth daily as needed for constipation.     CALTRATE 600+D 600-400 MG-UNIT per tablet  Generic drug:  Calcium Carbonate-Vitamin D  Take 1 tablet by mouth every evening.     cholecalciferol 1000 UNITS tablet  Commonly known as:  VITAMIN D  Take 2,000 Units by mouth every morning.     docusate sodium 100 MG capsule  Commonly known as:  COLACE  Take 100 mg by mouth every evening.     escitalopram 5 MG tablet  Commonly known as:  LEXAPRO  Take 5 mg by mouth every evening.     FLAX SEED OIL PO  Take 1 tablet by mouth daily at 12 noon.     hydroxychloroquine 200 MG tablet  Commonly known as:  PLAQUENIL  Take 200 mg by mouth 2 (two) times daily.     isosorbide mononitrate 30 MG 24 hr tablet  Commonly known as:  IMDUR  Take 30 mg by mouth every morning.     LINZESS 145 MCG Caps capsule  Generic drug:  Linaclotide  Take 145 mcg by mouth every morning.     methocarbamol 500 MG tablet  Commonly known as:  ROBAXIN  Take 1 tablet (500 mg total) by mouth 3 (three) times daily as needed (For muscle spasms.).     multivitamin with minerals Tabs tablet  Take 1 tablet by mouth daily at 12 noon.     neomycin-bacitracin-polymyxin ointment  Commonly known as:  NEOSPORIN  Apply 1 application topically daily as needed (For cuts and scratches - has very sensitive skin.).     omeprazole 20 MG tablet  Commonly known as:  PRILOSEC OTC  Take 20 mg by mouth every morning.     polyvinyl alcohol 1.4 % ophthalmic solution  Commonly known as:  LIQUIFILM TEARS  Place 1 drop into both eyes as needed (Dry eyes.).     promethazine 12.5 MG tablet  Commonly known as:  PHENERGAN  Take 12.5 mg by mouth every 6 (six) hours as needed for nausea (Patient takes with Tramadol).     SALONPAS 1.2-5.7-6.3 % Ptch  Generic drug:  Camphor-Menthol-Methyl Sal  Place 1  patch onto the skin daily as needed (For pain.).     sulfaSALAzine 500 MG tablet  Commonly known as:  AZULFIDINE  Take 500 mg by mouth every evening.     traMADol 50 MG tablet  Commonly known as:  Janean Sark  Take 2 tablets (100 mg total) by mouth every 6 (six) hours as needed for moderate pain.     vitamin C 500 MG tablet  Commonly known as:  ASCORBIC ACID  Take 500 mg by mouth daily at 12 noon.     warfarin 3 MG tablet  Commonly known as:  COUMADIN  Take 3 mg by mouth See admin instructions. She is to take last two doses on 12/20/12 and 12/21/12.        Diagnostic Studies: Dg Pelvis Portable  12/27/2012   CLINICAL DATA:  Postop evaluation of right hip.  EXAM: PORTABLE PELVIS 1-2 VIEWS  COMPARISON:  04/12/2012. 04/11/2012.  FINDINGS: Dynamic Edit hip screw noted on the right with long intra medullary nail. No evidence for hardware complications. Skin staples are seen overlying the right hip. The patient has a left total hip replacement, as before. Bones are diffusely demineralized. Stent device noted in the right common iliac region.  IMPRESSION: Right dynamic hip screw with long femoral nail. No complicating features.   Electronically Signed   By: Kennith Center M.D.   On: 12/27/2012 12:37    Disposition: Home with home health      Discharge Orders   Future Orders Complete By Expires   Discharge wound care:  As directed    Comments:     May shower apply clean dressing after showering.   Weight bearing as tolerated  As directed    Questions:     Laterality:     Extremity:        Follow-up Information   Follow up with [facility] . (Home Health Physical Therapy and RN)    Contact information:   [phone]    Follow up with PCP in approximately 5 days for INR check.   SignedRichardean Canal 01/01/2013, 8:37 AM

## 2013-01-01 NOTE — Progress Notes (Signed)
Subjective: 5 Days Post-Op Procedure(s) (LRB): RIGHT HIP BURSECTOMY AND COMPRESSION SCREW REMOVAL (Right) Patient reports pain as mild.  Doing well no complaints.  Objective: Vital signs in last 24 hours: Temp:  [98 F (36.7 C)-98.6 F (37 C)] 98.6 F (37 C) (11/12 0608) Pulse Rate:  [66-77] 66 (11/12 0608) Resp:  [16-18] 16 (11/12 0608) BP: (134-147)/(65-68) 147/65 mmHg (11/12 0608) SpO2:  [98 %-99 %] 98 % (11/12 0608)  Intake/Output from previous day: 11/11 0701 - 11/12 0700 In: 480 [P.O.:480] Out: -  Intake/Output this shift:     Recent Labs  12/30/12 0458 12/31/12 0413 01/01/13 0420  HGB 10.1* 9.9* 10.2*    Recent Labs  12/31/12 0413 01/01/13 0420  WBC 6.4 4.8  RBC 3.25* 3.38*  HCT 29.5* 30.4*  PLT 144* 151   No results found for this basename: NA, K, CL, CO2, BUN, CREATININE, GLUCOSE, CALCIUM,  in the last 72 hours  Recent Labs  12/31/12 0413 01/01/13 0420  INR 1.76* 2.24*    Neurovascular intact Dorsiflexion/Plantar flexion intact Incision: dressing C/D/I and scant drainage Compartment soft  Assessment/Plan: 5 Days Post-Op Procedure(s) (LRB): RIGHT HIP BURSECTOMY AND COMPRESSION SCREW REMOVAL (Right) Discharge home with home health INR 2.24 will resume home Coumadin dosing wil follow -up with PCP in next few days for INR check Rondall Radigan 01/01/2013, 8:08 AM

## 2013-01-01 NOTE — Progress Notes (Signed)
ANTICOAGULATION CONSULT NOTE - Follow-up  Pharmacy Consult for Coumadin Indication: Hx of DVT, chronic Warfarin  Allergies  Allergen Reactions  . Codeine Nausea And Vomiting, Swelling and Rash  . Hydrocodone Nausea And Vomiting, Swelling and Rash    "mouth peeled"  . Morphine And Related Other (See Comments)    hallucinations  . Other Nausea And Vomiting    Most pain meds other than tramadol. Tylenol, Toradol, and Robaxin are ok.    . Penicillins Hives  . Darvocet [Propoxyphene-Acetaminophen] Hives and Nausea And Vomiting  . Demerol [Meperidine] Nausea And Vomiting  . Statins Other (See Comments)    INTOLERANT OF STATINS - messes up her legs and has muscle aches  . Tape Other (See Comments)    Needs paper tape - skin very fragile   Patient Measurements: Height: 5\' 1"  (154.9 cm) Weight: 116 lb (52.617 kg) IBW/kg (Calculated) : 47.8  Labs:  Recent Labs  12/30/12 0458 12/31/12 0413 01/01/13 0420  HGB 10.1* 9.9* 10.2*  HCT 30.2* 29.5* 30.4*  PLT 139* 144* 151  LABPROT 16.1* 20.0* 24.1*  INR 1.32 1.76* 2.24*   Assessment: 79yo F on chronic Coumadin for DVT. Pharmacy is asked to dose Coumadin s/p R hip bursectomy and compression screw removal performed 12/27/12. Pt was bridged with Lovenox 1.5mg /kg q24h pre-op and also ordered post-op, weight documented as 52.6kg. Most recent Coumadin regimen was reportedly 3mg  daily. Pre-op INR was 1.13 on 12/25/12.   No bleeding has been reported  INR today is therapeutic at 2.24 after doses of 4,4,7.5, 7.5, 7.5mg  Warfarin  Hgb low, stable, plts now WNL.   Tolerating regular diet, 100%   Pt to D/C home today with [facility] home-health services now that INR is therapeutic.  Spoke with RN who will follow-up regarding specific date for next INR check. Pt has been getting boosted doses of coumadin inpatient to increase rate of rise of INR.  MD has d/c'd patient back on home dose of warfarin 3mg  daily.  I feel comfortable with this regimen as  long as patient has INR checked either tomorrow or Friday.   Goal of Therapy:  INR 2-3 Monitor platelets by anticoagulation protocol: Yes   Plan:   Resume coumadin 3mg  daily (home dose)  Stop lovenox  INR f/u tomorrow or Friday (RN checking into this)  Thank you for the consult.  Haynes Hoehn, PharmD 01/01/2013, 12:07 PM  Pager: [phone]

## 2013-01-02 NOTE — Care Management Note (Signed)
    Page 1 of 2   01/02/2013     6:23:35 PM   CARE MANAGEMENT NOTE 01/02/2013  Patient:  [name], [name]   Account Number:  [id number]  Date Initiated:  12/27/2012  Documentation initiated by:  [facility]  Subjective/Objective Assessment:   RIGHT HIP BURSECTOMY AND COMPRESSION SCREW REMOVAL (Right)     Action/Plan:   waiting PT/OT recommendations   Anticipated DC Date:  12/30/2012   Anticipated DC Plan:  SKILLED NURSING FACILITY      DC Planning Services  CM consult      [facility] Choice  HOME HEALTH   Choice offered to / List presented to:  C-1 Patient   DME arranged  OTHER - SEE COMMENT      DME agency  [facility]     HH arranged  HH-1 RN  HH-2 PT      [place] [facility] agency  [facility]   Status of service:  Completed, signed off Medicare Important Message given?   (If response is "NO", the following Medicare IM given date fields will be blank) Date Medicare IM given:   Date Additional Medicare IM given:    Discharge Disposition:  HOME W HOME HEALTH SERVICES  Per UR Regulation:    If discussed at Long Length of Stay Meetings, dates discussed:    Comments:  01/02/2013 [facility] Rital Cavey,BSNRNCCM [phone] PT DISCHARGED 01/01/2013 WITH HHRN AND HHPT SERVICES IN PLACE. [facility] WILL START SERVICES 01/02/2013; PT WILL REQUIRE COUMADIN MANAGEMENT ALONG WITH HHPT SERVICES.  12/31/2012 Dory Peru RN CCM [phone] Pt voices that she plans to go home where grand daughter will be caregiver. States she already has commode, RW and bath chair. States she does not think she will need HHpt services. CM will follow progress.

## 2013-01-03 NOTE — Progress Notes (Signed)
Utilization review completed.  

## 2013-08-20 ENCOUNTER — Inpatient Hospital Stay (HOSPITAL_COMMUNITY)
Admission: EM | Admit: 2013-08-20 | Discharge: 2013-08-25 | DRG: 536 | Disposition: A | Discharge status: Home Health | Payer: Medicare Other | Attending: Internal Medicine | Admitting: Internal Medicine

## 2013-08-20 ENCOUNTER — Emergency Department (HOSPITAL_COMMUNITY): Payer: Medicare Other

## 2013-08-20 ENCOUNTER — Encounter (HOSPITAL_COMMUNITY): Payer: Self-pay | Admitting: Emergency Medicine

## 2013-08-20 ENCOUNTER — Inpatient Hospital Stay (HOSPITAL_COMMUNITY): Payer: Medicare Other

## 2013-08-20 DIAGNOSIS — Z96649 Presence of unspecified artificial hip joint: Secondary | ICD-10-CM

## 2013-08-20 DIAGNOSIS — Y93E2 Activity, laundry: Secondary | ICD-10-CM

## 2013-08-20 DIAGNOSIS — S2249XA Multiple fractures of ribs, unspecified side, initial encounter for closed fracture: Secondary | ICD-10-CM | POA: Diagnosis present

## 2013-08-20 DIAGNOSIS — I714 Abdominal aortic aneurysm, without rupture, unspecified: Secondary | ICD-10-CM

## 2013-08-20 DIAGNOSIS — F411 Generalized anxiety disorder: Secondary | ICD-10-CM

## 2013-08-20 DIAGNOSIS — Z9861 Coronary angioplasty status: Secondary | ICD-10-CM

## 2013-08-20 DIAGNOSIS — K5730 Diverticulosis of large intestine without perforation or abscess without bleeding: Secondary | ICD-10-CM | POA: Diagnosis present

## 2013-08-20 DIAGNOSIS — Z8042 Family history of malignant neoplasm of prostate: Secondary | ICD-10-CM

## 2013-08-20 DIAGNOSIS — I25811 Atherosclerosis of native coronary artery of transplanted heart without angina pectoris: Secondary | ICD-10-CM

## 2013-08-20 DIAGNOSIS — K219 Gastro-esophageal reflux disease without esophagitis: Secondary | ICD-10-CM | POA: Diagnosis present

## 2013-08-20 DIAGNOSIS — Z803 Family history of malignant neoplasm of breast: Secondary | ICD-10-CM

## 2013-08-20 DIAGNOSIS — E118 Type 2 diabetes mellitus with unspecified complications: Secondary | ICD-10-CM

## 2013-08-20 DIAGNOSIS — R490 Dysphonia: Secondary | ICD-10-CM

## 2013-08-20 DIAGNOSIS — Z01810 Encounter for preprocedural cardiovascular examination: Secondary | ICD-10-CM

## 2013-08-20 DIAGNOSIS — J410 Simple chronic bronchitis: Secondary | ICD-10-CM

## 2013-08-20 DIAGNOSIS — J4489 Other specified chronic obstructive pulmonary disease: Secondary | ICD-10-CM | POA: Diagnosis present

## 2013-08-20 DIAGNOSIS — Z961 Presence of intraocular lens: Secondary | ICD-10-CM

## 2013-08-20 DIAGNOSIS — Z87891 Personal history of nicotine dependence: Secondary | ICD-10-CM

## 2013-08-20 DIAGNOSIS — W010XXA Fall on same level from slipping, tripping and stumbling without subsequent striking against object, initial encounter: Secondary | ICD-10-CM | POA: Diagnosis present

## 2013-08-20 DIAGNOSIS — I10 Essential (primary) hypertension: Secondary | ICD-10-CM | POA: Diagnosis present

## 2013-08-20 DIAGNOSIS — Y92009 Unspecified place in unspecified non-institutional (private) residence as the place of occurrence of the external cause: Secondary | ICD-10-CM

## 2013-08-20 DIAGNOSIS — Z833 Family history of diabetes mellitus: Secondary | ICD-10-CM

## 2013-08-20 DIAGNOSIS — D509 Iron deficiency anemia, unspecified: Secondary | ICD-10-CM | POA: Diagnosis present

## 2013-08-20 DIAGNOSIS — J449 Chronic obstructive pulmonary disease, unspecified: Secondary | ICD-10-CM | POA: Diagnosis present

## 2013-08-20 DIAGNOSIS — Z888 Allergy status to other drugs, medicaments and biological substances status: Secondary | ICD-10-CM

## 2013-08-20 DIAGNOSIS — Z9851 Tubal ligation status: Secondary | ICD-10-CM

## 2013-08-20 DIAGNOSIS — K2270 Barrett's esophagus without dysplasia: Secondary | ICD-10-CM | POA: Diagnosis present

## 2013-08-20 DIAGNOSIS — Z9849 Cataract extraction status, unspecified eye: Secondary | ICD-10-CM

## 2013-08-20 DIAGNOSIS — I825Y9 Chronic embolism and thrombosis of unspecified deep veins of unspecified proximal lower extremity: Secondary | ICD-10-CM | POA: Diagnosis present

## 2013-08-20 DIAGNOSIS — Z79899 Other long term (current) drug therapy: Secondary | ICD-10-CM

## 2013-08-20 DIAGNOSIS — I739 Peripheral vascular disease, unspecified: Secondary | ICD-10-CM | POA: Diagnosis present

## 2013-08-20 DIAGNOSIS — S2231XA Fracture of one rib, right side, initial encounter for closed fracture: Secondary | ICD-10-CM

## 2013-08-20 DIAGNOSIS — S32509K Unspecified fracture of unspecified pubis, subsequent encounter for fracture with nonunion: Secondary | ICD-10-CM

## 2013-08-20 DIAGNOSIS — S32599A Other specified fracture of unspecified pubis, initial encounter for closed fracture: Secondary | ICD-10-CM

## 2013-08-20 DIAGNOSIS — Z880 Allergy status to penicillin: Secondary | ICD-10-CM

## 2013-08-20 DIAGNOSIS — I82519 Chronic embolism and thrombosis of unspecified femoral vein: Secondary | ICD-10-CM | POA: Diagnosis present

## 2013-08-20 DIAGNOSIS — I70209 Unspecified atherosclerosis of native arteries of extremities, unspecified extremity: Secondary | ICD-10-CM

## 2013-08-20 DIAGNOSIS — E1165 Type 2 diabetes mellitus with hyperglycemia: Secondary | ICD-10-CM

## 2013-08-20 DIAGNOSIS — K5900 Constipation, unspecified: Secondary | ICD-10-CM

## 2013-08-20 DIAGNOSIS — E785 Hyperlipidemia, unspecified: Secondary | ICD-10-CM | POA: Diagnosis present

## 2013-08-20 DIAGNOSIS — S32501A Unspecified fracture of right pubis, initial encounter for closed fracture: Secondary | ICD-10-CM

## 2013-08-20 DIAGNOSIS — M810 Age-related osteoporosis without current pathological fracture: Secondary | ICD-10-CM | POA: Diagnosis present

## 2013-08-20 DIAGNOSIS — IMO0002 Reserved for concepts with insufficient information to code with codable children: Secondary | ICD-10-CM | POA: Diagnosis present

## 2013-08-20 DIAGNOSIS — I2510 Atherosclerotic heart disease of native coronary artery without angina pectoris: Secondary | ICD-10-CM | POA: Diagnosis present

## 2013-08-20 DIAGNOSIS — Z7901 Long term (current) use of anticoagulants: Secondary | ICD-10-CM

## 2013-08-20 DIAGNOSIS — Z96619 Presence of unspecified artificial shoulder joint: Secondary | ICD-10-CM

## 2013-08-20 DIAGNOSIS — Z885 Allergy status to narcotic agent status: Secondary | ICD-10-CM

## 2013-08-20 DIAGNOSIS — M169 Osteoarthritis of hip, unspecified: Secondary | ICD-10-CM | POA: Diagnosis present

## 2013-08-20 DIAGNOSIS — N390 Urinary tract infection, site not specified: Secondary | ICD-10-CM | POA: Diagnosis present

## 2013-08-20 DIAGNOSIS — S32509A Unspecified fracture of unspecified pubis, initial encounter for closed fracture: Principal | ICD-10-CM | POA: Diagnosis present

## 2013-08-20 DIAGNOSIS — S2239XK Fracture of one rib, unspecified side, subsequent encounter for fracture with nonunion: Secondary | ICD-10-CM

## 2013-08-20 DIAGNOSIS — M069 Rheumatoid arthritis, unspecified: Secondary | ICD-10-CM | POA: Diagnosis present

## 2013-08-20 DIAGNOSIS — M1610 Unilateral primary osteoarthritis, unspecified hip: Secondary | ICD-10-CM | POA: Diagnosis present

## 2013-08-20 DIAGNOSIS — Z9089 Acquired absence of other organs: Secondary | ICD-10-CM

## 2013-08-20 DIAGNOSIS — S2239XA Fracture of one rib, unspecified side, initial encounter for closed fracture: Secondary | ICD-10-CM

## 2013-08-20 HISTORY — DX: Fall on same level from slipping, tripping and stumbling without subsequent striking against object, initial encounter: W01.0XXA

## 2013-08-20 HISTORY — DX: Personal history of other medical treatment: Z92.89

## 2013-08-20 LAB — CBC
HCT: 33.3 % — ABNORMAL LOW (ref 36.0–46.0)
Hemoglobin: 10.9 g/dL — ABNORMAL LOW (ref 12.0–15.0)
MCH: 29.7 pg (ref 26.0–34.0)
MCHC: 32.7 g/dL (ref 30.0–36.0)
MCV: 90.7 fL (ref 78.0–100.0)
Platelets: 163 10*3/uL (ref 150–400)
RBC: 3.67 MIL/uL — ABNORMAL LOW (ref 3.87–5.11)
RDW: 13.8 % (ref 11.5–15.5)
WBC: 12.9 10*3/uL — ABNORMAL HIGH (ref 4.0–10.5)

## 2013-08-20 LAB — URINALYSIS, ROUTINE W REFLEX MICROSCOPIC
Bilirubin Urine: NEGATIVE
Glucose, UA: NEGATIVE mg/dL
Ketones, ur: NEGATIVE mg/dL
NITRITE: POSITIVE — AB
PH: 7 (ref 5.0–8.0)
Protein, ur: NEGATIVE mg/dL
SPECIFIC GRAVITY, URINE: 1.013 (ref 1.005–1.030)
Urobilinogen, UA: 0.2 mg/dL (ref 0.0–1.0)

## 2013-08-20 LAB — URINE MICROSCOPIC-ADD ON

## 2013-08-20 LAB — BASIC METABOLIC PANEL
Anion gap: 13 (ref 5–15)
BUN: 7 mg/dL (ref 6–23)
CO2: 24 mEq/L (ref 19–32)
Calcium: 9 mg/dL (ref 8.4–10.5)
Chloride: 99 mEq/L (ref 96–112)
Creatinine, Ser: 0.51 mg/dL (ref 0.50–1.10)
GFR calc Af Amer: >90 mL/min (ref >90)
GFR calc non Af Amer: 88 mL/min — ABNORMAL LOW (ref >90)
Glucose, Bld: 146 mg/dL — ABNORMAL HIGH (ref 70–99)
Potassium: 3.8 mEq/L (ref 3.7–5.3)
Sodium: 136 mEq/L — ABNORMAL LOW (ref 137–147)

## 2013-08-20 LAB — PROTIME-INR
INR: 1.95 — ABNORMAL HIGH (ref 0.00–1.49)
Prothrombin Time: 22.2 seconds — ABNORMAL HIGH (ref 11.6–15.2)

## 2013-08-20 LAB — GLUCOSE, CAPILLARY: GLUCOSE-CAPILLARY: 111 mg/dL — AB (ref 70–99)

## 2013-08-20 MED ORDER — VITAMIN D3 25 MCG (1000 UNIT) PO TABS
2000.0000 [IU] | ORAL_TABLET | Freq: Every day | ORAL | Status: DC
  Administered 2013-08-20 – 2013-08-25 (×6): 2000 [IU] via ORAL
  Filled 2013-08-20 (×6): qty 2

## 2013-08-20 MED ORDER — HYDROXYCHLOROQUINE SULFATE 200 MG PO TABS
200.0000 mg | ORAL_TABLET | Freq: Two times a day (BID) | ORAL | Status: DC
  Administered 2013-08-20 – 2013-08-25 (×10): 200 mg via ORAL
  Filled 2013-08-20 (×11): qty 1

## 2013-08-20 MED ORDER — INSULIN ASPART 100 UNIT/ML ~~LOC~~ SOLN
0.0000 [IU] | Freq: Three times a day (TID) | SUBCUTANEOUS | Status: DC
  Administered 2013-08-21 – 2013-08-22 (×2): 2 [IU] via SUBCUTANEOUS
  Administered 2013-08-22 – 2013-08-24 (×4): 1 [IU] via SUBCUTANEOUS

## 2013-08-20 MED ORDER — BISACODYL 5 MG PO TBEC: 5.0000 mg | DELAYED_RELEASE_TABLET | Freq: Every day | ORAL | Status: DC | PRN

## 2013-08-20 MED ORDER — ONDANSETRON HCL 4 MG/2ML IJ SOLN: 4.0000 mg | Freq: Four times a day (QID) | INTRAMUSCULAR | Status: DC | PRN

## 2013-08-20 MED ORDER — TRAMADOL HCL 50 MG PO TABS
100.0000 mg | ORAL_TABLET | Freq: Four times a day (QID) | ORAL | Status: DC | PRN
  Administered 2013-08-21 – 2013-08-25 (×11): 100 mg via ORAL
  Filled 2013-08-20 (×11): qty 2

## 2013-08-20 MED ORDER — WARFARIN - PHARMACIST DOSING INPATIENT: Freq: Every day | Status: DC

## 2013-08-20 MED ORDER — AMLODIPINE BESYLATE 5 MG PO TABS
5.0000 mg | ORAL_TABLET | Freq: Every day | ORAL | Status: DC
  Administered 2013-08-21 – 2013-08-25 (×5): 5 mg via ORAL
  Filled 2013-08-20 (×5): qty 1

## 2013-08-20 MED ORDER — HYDROMORPHONE HCL PF 1 MG/ML IJ SOLN
0.5000 mg | Freq: Once | INTRAMUSCULAR | Status: AC
  Administered 2013-08-20: 0.5 mg via INTRAVENOUS
  Filled 2013-08-20: qty 1

## 2013-08-20 MED ORDER — ISOSORBIDE MONONITRATE ER 30 MG PO TB24
30.0000 mg | ORAL_TABLET | Freq: Every day | ORAL | Status: DC
  Administered 2013-08-20 – 2013-08-25 (×6): 30 mg via ORAL
  Filled 2013-08-20 (×6): qty 1

## 2013-08-20 MED ORDER — ESCITALOPRAM OXALATE 5 MG PO TABS
5.0000 mg | ORAL_TABLET | Freq: Every evening | ORAL | Status: DC
  Administered 2013-08-20 – 2013-08-25 (×6): 5 mg via ORAL
  Filled 2013-08-20 (×6): qty 1

## 2013-08-20 MED ORDER — OMEPRAZOLE MAGNESIUM 20 MG PO TBEC: 40.0000 mg | DELAYED_RELEASE_TABLET | Freq: Every morning | ORAL | Status: DC

## 2013-08-20 MED ORDER — WARFARIN SODIUM 3 MG PO TABS
3.0000 mg | ORAL_TABLET | Freq: Once | ORAL | Status: AC
  Administered 2013-08-20: 3 mg via ORAL
  Filled 2013-08-20: qty 1

## 2013-08-20 MED ORDER — FLEET ENEMA 7-19 GM/118ML RE ENEM: 1.0000 | ENEMA | Freq: Once | RECTAL | Status: AC | PRN

## 2013-08-20 MED ORDER — BACITRACIN-NEOMYCIN-POLYMYXIN OINTMENT TUBE
1.0000 "application " | TOPICAL_OINTMENT | Freq: Every day | CUTANEOUS | Status: DC | PRN
  Filled 2013-08-20: qty 1

## 2013-08-20 MED ORDER — ONDANSETRON HCL 4 MG PO TABS
4.0000 mg | ORAL_TABLET | Freq: Four times a day (QID) | ORAL | Status: DC | PRN
  Administered 2013-08-23: 4 mg via ORAL
  Filled 2013-08-20: qty 1

## 2013-08-20 MED ORDER — METHOCARBAMOL 500 MG PO TABS
500.0000 mg | ORAL_TABLET | Freq: Three times a day (TID) | ORAL | Status: DC | PRN
  Administered 2013-08-21 – 2013-08-24 (×5): 500 mg via ORAL
  Filled 2013-08-20 (×5): qty 1

## 2013-08-20 MED ORDER — HYDROMORPHONE HCL PF 1 MG/ML IJ SOLN
0.5000 mg | INTRAMUSCULAR | Status: DC | PRN
Start: 2013-08-20 — End: 2013-08-25

## 2013-08-20 MED ORDER — POLYETHYLENE GLYCOL 3350 17 G PO PACK: 17.0000 g | PACK | Freq: Every day | ORAL | Status: DC | PRN

## 2013-08-20 MED ORDER — PANTOPRAZOLE SODIUM 40 MG PO TBEC
40.0000 mg | DELAYED_RELEASE_TABLET | Freq: Every day | ORAL | Status: DC
  Administered 2013-08-20 – 2013-08-25 (×6): 40 mg via ORAL
  Filled 2013-08-20 (×5): qty 1

## 2013-08-20 MED ORDER — INSULIN ASPART 100 UNIT/ML ~~LOC~~ SOLN: 0.0000 [IU] | Freq: Every day | SUBCUTANEOUS | Status: DC

## 2013-08-20 MED ORDER — LINACLOTIDE 145 MCG PO CAPS
145.0000 ug | ORAL_CAPSULE | Freq: Every day | ORAL | Status: DC
  Administered 2013-08-20 – 2013-08-25 (×5): 145 ug via ORAL
  Filled 2013-08-20 (×6): qty 1

## 2013-08-20 MED ORDER — SULFASALAZINE 500 MG PO TABS
500.0000 mg | ORAL_TABLET | Freq: Three times a day (TID) | ORAL | Status: DC
  Administered 2013-08-20 – 2013-08-25 (×15): 500 mg via ORAL
  Filled 2013-08-20 (×16): qty 1

## 2013-08-20 MED ORDER — BISACODYL 10 MG RE SUPP: 10.0000 mg | Freq: Every day | RECTAL | Status: DC | PRN

## 2013-08-20 MED ORDER — DOCUSATE SODIUM 100 MG PO CAPS
100.0000 mg | ORAL_CAPSULE | Freq: Every evening | ORAL | Status: DC
  Administered 2013-08-20 – 2013-08-25 (×6): 100 mg via ORAL
  Filled 2013-08-20 (×6): qty 1

## 2013-08-20 NOTE — ED Notes (Signed)
Granddaughter- [phone] Tammy. Would like to be updated with discharge status, etc.

## 2013-08-20 NOTE — ED Notes (Signed)
Pt presents via [facility] c/o un witnessed fall in bathroom. States she hit her head on the bathtub. Pt also c/o right leg, arm and rib pain. NAD. No obvious deformity. Skin tear noted to upper right arm. Pt reports taking coumadin.

## 2013-08-20 NOTE — ED Notes (Signed)
Pt tolerating PO Fluids without an issue.

## 2013-08-20 NOTE — Progress Notes (Addendum)
CARE MANAGEMENT NOTE 08/20/2013  Patient:  CANDIES, PALM   Account Number:  [id number]  Date Initiated:  08/20/2013  Documentation initiated by:  Laurena Slimmer  Subjective/Objective Assessment:   S/P Mechanical Fall with mutliple fx     Action/Plan:   HH / or SNF   Anticipated DC Date:     Anticipated DC Plan:  [place]         Choice offered to patient / List presented to:  yes           Status of service:   Medicare Important Message given?   (If response is "NO", the following Medicare IM given date fields will be blank) Date Medicare IM given:   Medicare IM given by:   Date Additional Medicare IM given:   Additional Medicare IM given by:    Discharge Disposition:    Per UR Regulation:    If discussed at Long Length of Stay Meetings, dates discussed:    Comments:  ED CM consulted by Leonie Douglas PA-C concerning possible [place] services vs. SNF. Pt presented to [facility] ED by EMS s/p fall. has a past medical history of History of Angina; Hiatal hernia; GERD (gastroesophageal reflux disease); Iron deficiency anemia; Barrett esophagus; Osteoporosis; HLD (hyperlipidemia); Internal hemorrhoids; Diverticulosis; Pain; CAD S/P percutaneous coronary angioplasty (2004); PAD (peripheral artery disease) (1994); HTN (hypertension); COPD (chronic obstructive pulmonary disease); Shortness of breath; Pneumonia; Nocturia; Headache(784.0); Osteoarthritis of both hips; DJD (degenerative joint disease); Rheumatoid arthritis; and H/O blood clots. Met with patient at bedside in the ED. Patient provided permission to discuss care. Payor source is [employer]. Verified information in record. Pt reports falling today in her bathroom with Multiple fractures secondary to fall. Pt unable to ambulate with her walker due to pain. Received several doses of IV Dilaudid in ED. Pt's 80 yo [place] lives with her temporarily while preparing for college. PT/OT evaluation ordered for once pain is controlled.  Pt is agreeable with plan Discussed care goals, patient verbalized wanting [place] services to assist with the return to pre fall  functioning state. Discussed [place] services, patient aware has had services in the past. She mentioned [facility] as her  choice if HH is an option. Discussed plan of care with Dr. Vanita Panda and Leonie Douglas both agreeable to possible plan.  Consult to hospitalistforpain management pending CM And CSW will continue f/u.

## 2013-08-20 NOTE — H&P (Addendum)
PCP:  Hoyle Sauer, MD  Cardiology [facility]  Chief Complaint:  fall  HPI: [name] is a 80 y.o. female   has a past medical history of History of  Angina; Hiatal hernia; GERD (gastroesophageal reflux disease); Iron deficiency anemia; Barrett esophagus; Osteoporosis; HLD (hyperlipidemia); Internal hemorrhoids; Diverticulosis; Pain; CAD S/P percutaneous coronary angioplasty (2004); PAD (peripheral artery disease) (1994); HTN (hypertension); COPD (chronic obstructive pulmonary disease); Shortness of breath; Pneumonia; Nocturia; Headache(784.0); Osteoarthritis of both hips; DJD (degenerative joint disease); Rheumatoid arthritis; and H/O blood clots.   Presented with  Patient had a fall in the bathroom while bending over to pick up some clothes off the floor. She hit her head, right Arm and right chest. CT pelvis showed pubic rami fracture. Patient also has 2 rib fractures. Patient has hx of DVT after leg fracture last year and is currently on coumadin with her dose needing recent adjustment. She reports hitting her head. CT of the head showed no hemorrhage. Patient does endorse right arm pain and she has a abrasion noted  Hospitalist was called for admission for Pubic rami fracture  Review of Systems:    Pertinent positives include:  Arthritis pain  Constitutional:  No weight loss, night sweats, Fevers, chills, fatigue, weight loss  HEENT:  No headaches, Difficulty swallowing,Tooth/dental problems,Sore throat,  No sneezing, itching, ear ache, nasal congestion, post nasal drip,  Cardio-vascular:  No chest pain, Orthopnea, PND, anasarca, dizziness, palpitations.no Bilateral lower extremity swelling  GI:  No heartburn, indigestion, abdominal pain, nausea, vomiting, diarrhea, change in bowel habits, loss of appetite, melena, blood in stool, hematemesis Resp:  no shortness of breath at rest. No dyspnea on exertion, No excess mucus, no productive  cough, No non-productive cough, No coughing up of blood.No change in color of mucus.No wheezing. Skin:  no rash or lesions. No jaundice GU:  no dysuria, change in color of urine, no urgency or frequency. No straining to urinate.  No flank pain.  Musculoskeletal:  No joint pain or no joint swelling. No decreased range of motion. No back pain.  Psych:  No change in mood or affect. No depression or anxiety. No memory loss.  Neuro: no localizing neurological complaints, no tingling, no weakness, no double vision, no gait abnormality, no slurred speech, no confusion  Otherwise ROS are negative except for above, 10 systems were reviewed  Past Medical History: Past Medical History  Diagnosis Date  . History of  Angina     Treated with PCI in 2004; no active Symptoms.  . Hiatal hernia   . GERD (gastroesophageal reflux disease)   . Iron deficiency anemia   . Barrett esophagus   . Osteoporosis   . HLD (hyperlipidemia)     Statin intolerant  . Internal hemorrhoids   . Diverticulosis   . Pain     RIGHT SHOULDER WITH LIMITED ROM--STATES PREVIOUS RT TOTAL SHOULDDER REPLACEMENT-BUT STILL HAS PROBLEMS WITH SHOULDER  . CAD S/P percutaneous coronary angioplasty 2004    PCI-RCA (2.34mm x 12mm Taxus DES)& Cx (2.75 mm x 14 mm Cypher DES)  . PAD (peripheral artery disease) 1994    History of right iliac stenting, fem-fem bypass in 94; aortobifem bypass/ AAA repair - November 2004; latest ABIs of been roughly 1 bilaterally, followed by Dr. Arbie Cookey  . HTN (hypertension)     sometimes  . COPD (chronic obstructive pulmonary disease)     pt. denies having this  . Shortness of breath     MILD- WITH  EXERTION. chronic  . Pneumonia     frequent  . Nocturia   . Headache(784.0)     hx of  . Osteoarthritis of both hips      status post multiple hip operations, recent fall with fracture.    [name] DJD (degenerative joint disease)   . Rheumatoid arthritis   . H/O blood clots     bilateral lower extremity    Past Surgical History  Procedure Laterality Date  . Back surgery      x 2  . Appendectomy      80yrs old  . Total shoulder replacement  2011    right  . Cholecystectomy      several yrs ago  . Cataract extraction  2001, 2002    bilateral  . Total abdominal hysterectomy  1965  . Upper gastrointestinal endoscopy  multiple    w/bx, hiatal hernia, Barretts', esophageal polyp 1x  . Colonoscopy  05/09/2004; 06/08/1999    internal hemorrhoids; diverticulosis  . Rotator cuff repair      bilateral, 2 times on left  . Lumbar spine surgery  October 2012    spinal stenosis, Dr. Ophelia Charter  . Abdominal aortic aneurysm repair  2004    AortoBi-Fem Bypass  . Left carpal tunnel release march 2013    . Total hip arthroplasty  01/26/2012    Procedure: TOTAL HIP ARTHROPLASTY ANTERIOR APPROACH;  Surgeon: Kathryne Hitch, MD;  Location: [facility];  Service: Orthopedics;  Laterality: Left;  Left Total Hip Arthroplasty, Anterior Approach (C-Arm)  . Intramedullary (im) nail intertrochanteric Right 04/12/2012    Procedure: INTRAMEDULLARY (IM) NAIL INTERTROCHANTRIC Right;  Surgeon: Kathryne Hitch, MD;  Location: [facility] OR;  Service: Orthopedics;  Laterality: Right;  . Doppler echocardiography  06/06/2012    EF 50-60%.  Mild LVH, grade one diastolic function; normal PA pressures and CVP  . Lower extremity venous dopplers  08/16/2012    Partially occlusive right femoral vein thrombosis, also noted in right peroneal vein; no thrombosis in left sided veins.  . Nm myoview ltd  April 2014    Bowel artifact, No ischemia or infarction  . Femoral-femoral bypass graft  1994    Following right iliac stenting  . Doppler echocardiography  06/06/2012    Mild LVH, EF 55-60%.  Normal wall motion.  Mildly elevated LVEDP with grade one diastolic function.  Mild LA dilatation.  Normal PA pressures.  . Coronary angioplasty with stent placement  2004    RCA: Taxus DES 2.5 mm x 12 mm; Circumflex: Cypher DES 2.75 mm 14 mm   . Cardiac catheterization  October 2005    Patent RCA stent, 20% ISR of Cx stent; EF 50-55%  . Tubal ligation  1956  . Excision/release bursa hip Right 12/27/2012    Procedure: RIGHT HIP BURSECTOMY AND COMPRESSION SCREW REMOVAL;  Surgeon: Kathryne Hitch, MD;  Location: [facility];  Service: Orthopedics;  Laterality: Right;     Medications: Prior to Admission medications   Medication Sig Start Date End Date Taking? Authorizing Provider  amLODipine (NORVASC) 5 MG tablet Take 5 mg by mouth daily as needed (For systolic blood pressure (SBP) greater than 130.).     Historical Provider, MD  bisacodyl (DULCOLAX) 5 MG EC tablet Take 5 mg by mouth daily as needed for constipation.     Historical Provider, MD  Calcium Carbonate-Vitamin D (CALTRATE 600+D) 600-400 MG-UNIT per tablet Take 1 tablet by mouth every evening.     Historical Provider, MD  Camphor-Menthol-Methyl Sal (  SALONPAS) 1.2-5.7-6.3 % PTCH Place 1 patch onto the skin daily as needed (For pain.).    Historical Provider, MD  cholecalciferol (VITAMIN D) 1000 UNITS tablet Take 2,000 Units by mouth every morning.    Historical Provider, MD  Docosanol (ABREVA) 10 % CREA Apply 1 application topically daily as needed (For sores on mouth.).    Historical Provider, MD  docusate sodium (COLACE) 100 MG capsule Take 100 mg by mouth every evening.    Historical Provider, MD  escitalopram (LEXAPRO) 5 MG tablet Take 5 mg by mouth every evening.     Historical Provider, MD  Flaxseed, Linseed, (FLAX SEED OIL PO) Take 1 tablet by mouth daily at 12 noon.     Historical Provider, MD  hydroxychloroquine (PLAQUENIL) 200 MG tablet Take 200 mg by mouth 2 (two) times daily.     Historical Provider, MD  isosorbide mononitrate (IMDUR) 30 MG 24 hr tablet Take 30 mg by mouth every morning.    Historical Provider, MD  Linaclotide Karlene Einstein) 145 MCG CAPS capsule Take 145 mcg by mouth every morning.     Historical Provider, MD  methocarbamol (ROBAXIN) 500 MG tablet  Take 1 tablet (500 mg total) by mouth 3 (three) times daily as needed (For muscle spasms.). 01/01/13   Richardean Canal, PA-C  Multiple Vitamin (MULTIVITAMIN WITH MINERALS) TABS tablet Take 1 tablet by mouth daily at 12 noon.    Historical Provider, MD  neomycin-bacitracin-polymyxin (NEOSPORIN) ointment Apply 1 application topically daily as needed (For cuts and scratches - has very sensitive skin.).    Historical Provider, MD  omeprazole (PRILOSEC OTC) 20 MG tablet Take 20 mg by mouth every morning.     Historical Provider, MD  polyvinyl alcohol (LIQUIFILM TEARS) 1.4 % ophthalmic solution Place 1 drop into both eyes as needed (Dry eyes.). 04/15/12   Cristal Ford, MD  promethazine (PHENERGAN) 12.5 MG tablet Take 12.5 mg by mouth every 6 (six) hours as needed for nausea (Patient takes with Tramadol).    Historical Provider, MD  sulfaSALAzine (AZULFIDINE) 500 MG tablet Take 500 mg by mouth every evening.    Historical Provider, MD  traMADol (ULTRAM) 50 MG tablet Take 2 tablets (100 mg total) by mouth every 6 (six) hours as needed for moderate pain. 01/01/13   Richardean Canal, PA-C  vitamin C (ASCORBIC ACID) 500 MG tablet Take 500 mg by mouth daily at 12 noon.     Historical Provider, MD  warfarin (COUMADIN) 3 MG tablet Take 2.5-5 mg by mouth See admin instructions. States takes Monday, Wednesday, Friday 2.5 mg and Tuesday, Thursday, Saturday and and Sunday she takes 5 mg. patient unsure about the dose    Historical Provider, MD    Allergies:   Allergies  Allergen Reactions  . Codeine Nausea And Vomiting, Swelling and Rash  . Hydrocodone Nausea And Vomiting, Swelling and Rash    "mouth peeled"  . Morphine And Related Other (See Comments)    hallucinations  . Other Nausea And Vomiting    Most pain meds other than tramadol. Tylenol, Toradol, and Robaxin are ok.    . Penicillins Hives  . Darvocet [Propoxyphene N-Acetaminophen] Hives and Nausea And Vomiting  . Demerol [Meperidine] Nausea And  Vomiting  . Statins Other (See Comments)    INTOLERANT OF STATINS - messes up her legs and has muscle aches  . Tape Other (See Comments)    Needs paper tape - skin very fragile    Social History:  Ambulatory   walker  Lives at home With family  Daughter  [phone]   reports that she quit smoking about 13 years ago. Her smoking use included Cigarettes. She smoked 0.00 packs per day. She has never used smokeless tobacco. She reports that she does not drink alcohol or use illicit drugs.    Family History: family history includes Breast cancer in her mother; Diabetes in her mother; Heart disease in her mother; Prostate cancer in her father. There is no history of Colon cancer.    Physical Exam: Patient Vitals for the past 24 hrs:  BP Pulse Resp SpO2  08/20/13 1732 165/90 mmHg 89 20 99 %  08/20/13 1645 165/72 mmHg - 21 -  08/20/13 1630 159/63 mmHg 92 16 94 %  08/20/13 1431 157/71 mmHg 93 25 100 %  08/20/13 1345 162/66 mmHg 89 - 100 %  08/20/13 1203 150/81 mmHg 83 22 95 %  08/20/13 1200 150/51 mmHg 77 22 92 %  08/20/13 1145 146/56 mmHg 78 17 90 %  08/20/13 1117 134/58 mmHg 79 20 -    1. General:  in No Acute distress 2. Psychological: Alert and   Oriented 3. Head/ENT:   Moist   Mucous Membranes                          Head Non traumatic, neck supple                          Normal Dentition 4. SKIN: normal  Skin turgor,  Skin clean Dry right arm abrasion 5. Heart: Regular rate and rhythm no Murmur, Rub or gallop 6. Lungs:   no wheezes or crackles  Decreased lund sounds 7. Abdomen: Soft, non-tender, Non distended 8. Lower extremities: no clubbing, cyanosis, or edema 9. Neurologically Grossly intact, moving all 4 extremities equally 10. MSK: limited due to pain  body mass index is unknown because there is no weight on file.   Labs on Admission:   Recent Labs  08/20/13 1211  NA 136*  K 3.8  CL 99  CO2 24  GLUCOSE 146*  BUN 7  CREATININE 0.51   CALCIUM 9.0   No results found for this basename: AST, ALT, ALKPHOS, BILITOT, PROT, ALBUMIN,  in the last 72 hours No results found for this basename: LIPASE, AMYLASE,  in the last 72 hours  Recent Labs  08/20/13 1211  WBC 12.9*  HGB 10.9*  HCT 33.3*  MCV 90.7  PLT 163   No results found for this basename: CKTOTAL, CKMB, CKMBINDEX, TROPONINI,  in the last 72 hours No results found for this basename: TSH, T4TOTAL, FREET3, T3FREE, THYROIDAB,  in the last 72 hours No results found for this basename: VITAMINB12, FOLATE, FERRITIN, TIBC, IRON, RETICCTPCT,  in the last 72 hours Lab Results  Component Value Date   HGBA1C 6.8* 04/12/2012    The CrCl is unknown because both a height and weight (above a minimum accepted value) are required for this calculation. ABG No results found for this basename: phart, pco2, po2, hco3, tco2, acidbasedef, o2sat     No results found for this basename: DDIMER     Other results:  I have pearsonaly reviewed this: ECG REPORT  Rate 80  Rhythm: NSR ST&T Change: no ischemic   BNP (last 3 results) No results found for this basename: PROBNP,  in the last 8760 hours  There were no vitals filed for this visit.   Cultures:  Component Value Date/Time   SDES URINE, CATHETERIZED 04/12/2012 0747   SPECREQUEST NONE 04/12/2012 0747   CULT ESCHERICHIA COLI 04/12/2012 0747   REPTSTATUS 04/14/2012 FINAL 04/12/2012 0747     Radiological Exams on Admission: Dg Ribs Unilateral W/chest Right  08/20/2013   CLINICAL DATA:  Pain post trauma  EXAM: RIGHT RIBS AND CHEST - 3+ VIEW  COMPARISON:  April 12, 2012  FINDINGS: Frontal chest as well as bilateral oblique and cone-down lower rib images were obtained. There is no edema or consolidation. Heart size and pulmonary vascularity are normal. Aorta is tortuous with atherosclerotic change in the aorta. No adenopathy. Patient is status post right total shoulder replacement. There is a small hiatal hernia.  There are old  healed fractures of the left lateral eighth and ninth ribs. There is no apparent pneumothorax or effusion. There is a nondisplaced fracture of the antral lateral right fourth rib. There is a slightly displaced fracture of the anterior right sixth rib.  IMPRESSION: Acute rib fractures on the right without pneumothorax or effusion. Old healed rib fractures on the left. Small hiatal hernia present.   Electronically Signed   By: Bretta Bang M.D.   On: 08/20/2013 13:32   Dg Hip Complete Right  08/20/2013   CLINICAL DATA:  Larey Seat.  Right hip pain.  EXAM: RIGHT HIP - COMPLETE 2+ VIEW  COMPARISON:  04/11/2012  FINDINGS: The left hip prosthesis is intact. The right hip hardware is intact. There is a gamma nail and dynamic hip screw transfixing the remote intertrochanteric fracture. No acute bony findings. The bony pelvis appears intact. No definite pelvic fractures. Extensive vascular calcifications are again noted.  IMPRESSION: No acute bony findings.   Electronically Signed   By: Loralie Champagne M.D.   On: 08/20/2013 13:29   Dg Femur Right  08/20/2013   CLINICAL DATA:  Right hip  EXAM: RIGHT FEMUR - 2 VIEW  COMPARISON:  None.  FINDINGS: No acute fracture or dislocation. Moderate osteoarthritis of the right hip. Old healed right intertrochanteric fracture transfixed by an intra medullary nail and interlocking femoral neck screw. No lytic or sclerotic osseous lesion. Peripheral vascular atherosclerotic disease.  IMPRESSION: No acute osseous injury of the right femur.   Electronically Signed   By: Elige Ko   On: 08/20/2013 13:28   Ct Head Wo Contrast  08/20/2013   CLINICAL DATA:  Head injury  EXAM: CT HEAD WITHOUT CONTRAST  CT CERVICAL SPINE WITHOUT CONTRAST  TECHNIQUE: Multidetector CT imaging of the head and cervical spine was performed following the standard protocol without intravenous contrast. Multiplanar CT image reconstructions of the cervical spine were also generated.  COMPARISON:  04/11/2012   FINDINGS: CT HEAD FINDINGS  No skull fracture is noted. Atherosclerotic calcifications of carotid siphon. Mild mucosal thickening bilateral ethmoid air cells. The mastoid air cells are unremarkable.  No intracranial hemorrhage, mass effect or midline shift. Stable atrophy and chronic white matter disease. There is interval old appearing infarct in right occipital lobe axial images 14 and 15. This is of indeterminate age. Clinical correlation is necessary. If recent ischemia suspected further correlation with brain MRI is recommended. Stable atrophy and chronic white matter disease.  CT CERVICAL SPINE FINDINGS  Axial images of the cervical spine shows no acute fracture or subluxation. Computer processed images shows no acute fracture. Again noted disc space flattening at C6-C7 with mild anterior and mild posterior spurring. Stable minimal anterolisthesis about 2 mm C7 on T1 vertebral body. Again noted degenerative changes C1-C2  articulation with pannus formation. Again noted calcified disc protrusion at C3-C4 and C4-C5 level. Mild disc protrusion at C5-C6 level. No prevertebral soft tissue swelling. Diffuse osteopenia. Cervical airway is patent.  IMPRESSION: 1. No acute intracranial abnormality. 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 Clinical correlation is necessary. She who 2. No cervical spine acute fracture or subluxation. Stable degenerative changes as described above.   Electronically Signed   By: Natasha MeadLiviu  Pop M.D.   On: 08/20/2013 13:55   Ct Cervical Spine Wo Contrast  08/20/2013   CLINICAL DATA:  Head injury  EXAM: CT HEAD WITHOUT CONTRAST  CT CERVICAL SPINE WITHOUT CONTRAST  TECHNIQUE: Multidetector CT imaging of the head and cervical spine was performed following the standard protocol without intravenous contrast. Multiplanar CT image reconstructions of the cervical spine were also generated.  COMPARISON:  04/11/2012  FINDINGS: CT HEAD FINDINGS  No skull fracture is noted.  Atherosclerotic calcifications of carotid siphon. Mild mucosal thickening bilateral ethmoid air cells. The mastoid air cells are unremarkable.  No intracranial hemorrhage, mass effect or midline shift. Stable atrophy and chronic white matter disease. There is interval old appearing infarct in right occipital lobe axial images 14 and 15. This is of indeterminate age. Clinical correlation is necessary. If recent ischemia suspected further correlation with brain MRI is recommended. Stable atrophy and chronic white matter disease.  CT CERVICAL SPINE FINDINGS  Axial images of the cervical spine shows no acute fracture or subluxation. Computer processed images shows no acute fracture. Again noted disc space flattening at C6-C7 with mild anterior and mild posterior spurring. Stable minimal anterolisthesis about 2 mm C7 on T1 vertebral body. Again noted degenerative changes C1-C2 articulation with pannus formation. Again noted calcified disc protrusion at C3-C4 and C4-C5 level. Mild disc protrusion at C5-C6 level. No prevertebral soft tissue swelling. Diffuse osteopenia. Cervical airway is patent.  IMPRESSION: 1. No acute intracranial abnormality. 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 Clinical correlation is necessary. She who 2. No cervical spine acute fracture or subluxation. Stable degenerative changes as described above.   Electronically Signed   By: Natasha MeadLiviu  Pop M.D.   On: 08/20/2013 13:55   Ct Hip Right Wo Contrast  08/20/2013   CLINICAL DATA:  Fall.  Right hip pain.  EXAM: CT OF THE RIGHT HIP WITHOUT CONTRAST  TECHNIQUE: Multidetector CT imaging was performed according to the standard protocol. Multiplanar CT image reconstructions were also generated.  COMPARISON:  Plain films right hip earlier this same day.  FINDINGS: Right hip screw and long intra medullary nail for fixation of a remote healed subtrochanteric fracture identified. The right hip is located. No hip fracture is seen. The  patient has an acute right inferior pubic ramus fracture. Buckling of anterior cortex of the high right superior pubic ramus is consistent with nondisplaced fracture. The fracture is not visible on the comparison plain films. No other acute fracture is identified. There appears to be some subcutaneous hematoma over the right hip. Degenerative change lower lumbar spine is noted. Intrapelvic contents demonstrate atherosclerosis but no acute abnormality. The patient is status post hysterectomy.  IMPRESSION: Acute right inferior and high superior pubic rami fractures.  Negative for hip fracture with fixation hardware in place for remote healed fracture.   Electronically Signed   By: Drusilla Kannerhomas  Dalessio M.D.   On: 08/20/2013 16:57    Chart has been reviewed  Assessment/Plan  80 year old female after mechanical fall resulting in rib fractures and pubic rami fracture  admitted for pain control  Present on Admission:  Multiple fractures secondary to fall  -once pain better controlled will have PT OT evaluation. Given pain over right arm will obtain imaging patient has multiple allergies but responded well to low-dose Dilaudid we'll continue  . CAD (coronary artery disease), native coronary artery - PCI to RCA & Circumflex 2004 - currently stable continue home medications  . COPD (chronic obstructive pulmonary disease) patient states it's his COPD has been under good control continue to monitor  . DM (diabetes mellitus), type 2, uncontrolled with complications patient usually not on any medications diet-controlled will monitor blood sugars  . DVT, Right Femoral, "chronic" -  with extension to the peroneal continue Coumadin  . HTN (hypertension) continue home medications Leukocytosis - in the setting of trauma, but will evaluate for  sources of infection. Obtain UA  Prophylaxis: on coumadin  CODE STATUS:  FULL CODE   Other plan as per orders.  I have spent a total of 55 min on this  admission  Carys Malina 08/20/2013, 6:55 PM  [facility]  Pager [phone]   If 7AM-7PM, please contact the day team taking care of the patient  [email or web address]  Password [email or web address]

## 2013-08-20 NOTE — Progress Notes (Signed)
ANTICOAGULATION CONSULT NOTE - Initial Consult  Pharmacy Consult for coumadin Indication: DVT  Allergies  Allergen Reactions  . Codeine Nausea And Vomiting, Swelling and Rash  . Hydrocodone Nausea And Vomiting, Swelling and Rash    "mouth peeled"  . Morphine And Related Other (See Comments)    hallucinations  . Other Nausea And Vomiting    Most pain meds other than tramadol. Tylenol, Toradol, and Robaxin are ok.    . Penicillins Hives  . Darvocet [Propoxyphene N-Acetaminophen] Hives and Nausea And Vomiting  . Demerol [Meperidine] Nausea And Vomiting  . Statins Other (See Comments)    INTOLERANT OF STATINS - messes up her legs and has muscle aches  . Tape Other (See Comments)    Needs paper tape - skin very fragile    Patient Measurements:   Heparin Dosing Weight:   Vital Signs: BP: 117/48 mmHg (07/01 1930) Pulse Rate: 88 (07/01 1930)  Labs:  Recent Labs  08/20/13 1211  HGB 10.9*  HCT 33.3*  PLT 163  LABPROT 22.2*  INR 1.95*  CREATININE 0.51    The CrCl is unknown because both a height and weight (above a minimum accepted value) are required for this calculation.   Medical History: Past Medical History  Diagnosis Date  . History of  Angina     Treated with PCI in 2004; no active Symptoms.  . Hiatal hernia   . GERD (gastroesophageal reflux disease)   . Iron deficiency anemia   . Barrett esophagus   . Osteoporosis   . HLD (hyperlipidemia)     Statin intolerant  . Internal hemorrhoids   . Diverticulosis   . Pain     RIGHT SHOULDER WITH LIMITED ROM--STATES PREVIOUS RT TOTAL SHOULDDER REPLACEMENT-BUT STILL HAS PROBLEMS WITH SHOULDER  . CAD S/P percutaneous coronary angioplasty 2004    PCI-RCA (2.12mm x 39mm Taxus DES)& Cx (2.75 mm x 14 mm Cypher DES)  . PAD (peripheral artery disease) 1994    History of right iliac stenting, fem-fem bypass in 94; aortobifem bypass/ AAA repair - November 2004; latest ABIs of been roughly 1 bilaterally, followed by Dr. Arbie Cookey   . HTN (hypertension)     sometimes  . COPD (chronic obstructive pulmonary disease)     pt. denies having this  . Shortness of breath     MILD- WITH EXERTION. chronic  . Pneumonia     frequent  . Nocturia   . Headache(784.0)     hx of  . Osteoarthritis of both hips      status post multiple hip operations, recent fall with fracture.    [name] DJD (degenerative joint disease)   . Rheumatoid arthritis   . H/O blood clots     bilateral lower extremity    Medications:  Prescriptions prior to admission  Medication Sig Dispense Refill  . amLODipine (NORVASC) 5 MG tablet Take 5 mg by mouth daily as needed (For systolic blood pressure (SBP) greater than 130.).       [name] bisacodyl (DULCOLAX) 5 MG EC tablet Take 5 mg by mouth daily as needed for constipation.       . Calcium Carbonate-Vitamin D (CALTRATE 600+D) 600-400 MG-UNIT per tablet Take 1 tablet by mouth every evening.       . Camphor-Menthol-Methyl Sal (SALONPAS) 1.2-5.7-6.3 % PTCH Place 1 patch onto the skin daily as needed (For pain.).      [name] cholecalciferol (VITAMIN D) 1000 UNITS tablet Take 2,000 Units by mouth every morning.      [name]  Docosanol (ABREVA) 10 % CREA Apply 1 application topically daily as needed (For sores on mouth.).      [name] docusate sodium (COLACE) 100 MG capsule Take 100 mg by mouth every evening.      . escitalopram (LEXAPRO) 5 MG tablet Take 5 mg by mouth every evening.       . Flaxseed, Linseed, (FLAX SEED OIL PO) Take 1 tablet by mouth daily at 12 noon.       . hydroxychloroquine (PLAQUENIL) 200 MG tablet Take 200 mg by mouth 2 (two) times daily.       . isosorbide mononitrate (IMDUR) 30 MG 24 hr tablet Take 30 mg by mouth every morning.      . Linaclotide (LINZESS) 145 MCG CAPS capsule Take 145 mcg by mouth every morning.       . methocarbamol (ROBAXIN) 500 MG tablet Take 1 tablet (500 mg total) by mouth 3 (three) times daily as needed (For muscle spasms.).  40 tablet  0  . Multiple Vitamin (MULTIVITAMIN WITH MINERALS)  TABS tablet Take 1 tablet by mouth daily at 12 noon.      . neomycin-bacitracin-polymyxin (NEOSPORIN) ointment Apply 1 application topically daily as needed (For cuts and scratches - has very sensitive skin.).      [name] omeprazole (PRILOSEC OTC) 20 MG tablet Take 20 mg by mouth every morning.       . polyvinyl alcohol (LIQUIFILM TEARS) 1.4 % ophthalmic solution Place 1 drop into both eyes as needed (Dry eyes.).  15 mL    . promethazine (PHENERGAN) 12.5 MG tablet Take 12.5 mg by mouth every 6 (six) hours as needed for nausea (Patient takes with Tramadol).      [name] sulfaSALAzine (AZULFIDINE) 500 MG tablet Take 500 mg by mouth 3 (three) times daily.       . traMADol (ULTRAM) 50 MG tablet Take 2 tablets (100 mg total) by mouth every 6 (six) hours as needed for moderate pain.  60 tablet  0  . vitamin C (ASCORBIC ACID) 500 MG tablet Take 500 mg by mouth daily at 12 noon.       . warfarin (COUMADIN) 3 MG tablet Take 3 mg by mouth See admin instructions. States takes Monday, Wednesday 4.5 mg Tuesday, Thursday Friday Sat Sun 3 mg       Scheduled:  . [START ON 08/21/2013] cholecalciferol  2,000 Units Oral q morning - 10a  . docusate sodium  100 mg Oral QPM  . escitalopram  5 mg Oral QPM  . hydroxychloroquine  200 mg Oral BID  . insulin aspart  0-5 Units Subcutaneous QHS  . [START ON 08/21/2013] insulin aspart  0-9 Units Subcutaneous TID WC  . [START ON 08/21/2013] isosorbide mononitrate  30 mg Oral q morning - 10a  . [START ON 08/21/2013] Linaclotide  145 mcg Oral q morning - 10a  . [START ON 08/21/2013] omeprazole  40 mg Oral q morning - 10a  . sulfaSALAzine  500 mg Oral TID   Infusions:    Assessment: 80 yo who presented after a fall that resulted in rib and rami fractures. She has been on coumadin for DVT. Her INR is right around 2 and coumadin will be cont while she is here. Wonder if coumadin is still safe for her due to falls.   We'll need to clarify home dose.   Goal of Therapy:  INR 2-3 Monitor  platelets by anticoagulation protocol: Yes   Plan:   Coumadin 3mg  PO x1 Daily INR  Clarify home doses

## 2013-08-20 NOTE — ED Provider Notes (Signed)
CSN: [id number]     Arrival date & time 08/20/13  1113 History   First MD Initiated Contact with Patient 08/20/13 1115     Chief Complaint  Patient presents with  . Fall     (Consider location/radiation/quality/duration/timing/severity/associated sxs/prior Treatment) HPI Pt is an 80yo female brought to ED via EMS from home after trip and fall in bathroom. Pt is on coumadin for remote hx of blood clots in her leg.  Pt reports hitting her head during the fall but no LOC. Pt states she has right upper arm pain from a skin tear, right rib pain, worse with deep breathing and touch, right hip and right upper leg pain.   Pt state it feels like she broke her ribs as she hit her chest/side on the bathtub.  Pain is constant, aching, 8/10 at worst.  Pt also reports hx of fall last year that resulted in her right femur from shattering and required ORIF from Dr. Magnus Ivan.  Pt states she believes he had to attach the leg to her pelvis in her hip.  Pt denies LOC prior to fall, pt states she just tripped.  Denies recent illness. Denies numbness or tingling to arms or legs.   Past Medical History  Diagnosis Date  . History of  Angina     Treated with PCI in 2004; no active Symptoms.  . Hiatal hernia   . GERD (gastroesophageal reflux disease)   . Iron deficiency anemia   . Barrett esophagus   . Osteoporosis   . HLD (hyperlipidemia)     Statin intolerant  . Internal hemorrhoids   . Diverticulosis   . Pain     RIGHT SHOULDER WITH LIMITED ROM--STATES PREVIOUS RT TOTAL SHOULDDER REPLACEMENT-BUT STILL HAS PROBLEMS WITH SHOULDER  . CAD S/P percutaneous coronary angioplasty 2004    PCI-RCA (2.50mm x 12mm Taxus DES)& Cx (2.75 mm x 14 mm Cypher DES)  . PAD (peripheral artery disease) 1994    History of right iliac stenting, fem-fem bypass in 94; aortobifem bypass/ AAA repair - November 2004; latest ABIs of been roughly 1 bilaterally, followed by Dr. Arbie Cookey  . HTN (hypertension)     sometimes  . COPD (chronic  obstructive pulmonary disease)     pt. denies having this  . Shortness of breath     MILD- WITH EXERTION. chronic  . Pneumonia     frequent  . Nocturia   . Headache(784.0)     hx of  . Osteoarthritis of both hips      status post multiple hip operations, recent fall with fracture.    [name] DJD (degenerative joint disease)   . Rheumatoid arthritis   . H/O blood clots     bilateral lower extremity   Past Surgical History  Procedure Laterality Date  . Back surgery      x 2  . Appendectomy      80yrs old  . Total shoulder replacement  2011    right  . Cholecystectomy      several yrs ago  . Cataract extraction  2001, 2002    bilateral  . Total abdominal hysterectomy  1965  . Upper gastrointestinal endoscopy  multiple    w/bx, hiatal hernia, Barretts', esophageal polyp 1x  . Colonoscopy  05/09/2004; 06/08/1999    internal hemorrhoids; diverticulosis  . Rotator cuff repair      bilateral, 2 times on left  . Lumbar spine surgery  October 2012    spinal stenosis, Dr. Ophelia Charter  .  Abdominal aortic aneurysm repair  2004    AortoBi-Fem Bypass  . Left carpal tunnel release march 2013    . Total hip arthroplasty  01/26/2012    Procedure: TOTAL HIP ARTHROPLASTY ANTERIOR APPROACH;  Surgeon: Kathryne Hitch, MD;  Location: [facility];  Service: Orthopedics;  Laterality: Left;  Left Total Hip Arthroplasty, Anterior Approach (C-Arm)  . Intramedullary (im) nail intertrochanteric Right 04/12/2012    Procedure: INTRAMEDULLARY (IM) NAIL INTERTROCHANTRIC Right;  Surgeon: Kathryne Hitch, MD;  Location: [facility];  Service: Orthopedics;  Laterality: Right;  . Doppler echocardiography  06/06/2012    EF 50-60%.  Mild LVH, grade one diastolic function; normal PA pressures and CVP  . Lower extremity venous dopplers  08/16/2012    Partially occlusive right femoral vein thrombosis, also noted in right peroneal vein; no thrombosis in left sided veins.  . Nm myoview ltd  April 2014    Bowel artifact, No  ischemia or infarction  . Femoral-femoral bypass graft  1994    Following right iliac stenting  . Doppler echocardiography  06/06/2012    Mild LVH, EF 55-60%.  Normal wall motion.  Mildly elevated LVEDP with grade one diastolic function.  Mild LA dilatation.  Normal PA pressures.  . Coronary angioplasty with stent placement  2004    RCA: Taxus DES 2.5 mm x 12 mm; Circumflex: Cypher DES 2.75 mm 14 mm  . Cardiac catheterization  October 2005    Patent RCA stent, 20% ISR of Cx stent; EF 50-55%  . Tubal ligation  1956  . Excision/release bursa hip Right 12/27/2012    Procedure: RIGHT HIP BURSECTOMY AND COMPRESSION SCREW REMOVAL;  Surgeon: Kathryne Hitch, MD;  Location: [facility];  Service: Orthopedics;  Laterality: Right;   Family History  Problem Relation Age of Onset  . Prostate cancer Father   . Diabetes Mother   . Colon cancer Neg Hx   . Breast cancer Mother   . Heart disease Mother    History  Substance Use Topics  . Smoking status: Former Smoker    Types: Cigarettes    Quit date: 04/20/2000  . Smokeless tobacco: Never Used  . Alcohol Use: No   OB History   Grav Para Term Preterm Abortions TAB SAB Ect Mult Living                 Review of Systems  Constitutional: Negative for fever, chills and fatigue.  Eyes: Negative for photophobia and visual disturbance.  Respiratory: Negative for cough and shortness of breath.   Cardiovascular: Positive for chest pain ( right ribs). Negative for palpitations and leg swelling.  Gastrointestinal: Negative for nausea, vomiting, abdominal pain, diarrhea and constipation.  Genitourinary: Negative for dysuria, frequency and hematuria.  Musculoskeletal: Positive for arthralgias and myalgias. Negative for back pain, neck pain and neck stiffness.  Skin: Positive for wound ( right upper arm-skin tear). Negative for rash.  Neurological: Negative for dizziness, seizures, syncope, weakness, light-headedness, numbness and headaches.  All other  systems reviewed and are negative.     Allergies  Codeine; Hydrocodone; Morphine and related; Other; Penicillins; Darvocet; Demerol; Statins; and Tape  Home Medications   Prior to Admission medications   Medication Sig Start Date End Date Taking? Authorizing Provider  amLODipine (NORVASC) 5 MG tablet Take 5 mg by mouth daily as needed (For systolic blood pressure (SBP) greater than 130.).     Historical Provider, MD  bisacodyl (DULCOLAX) 5 MG EC tablet Take 5 mg by mouth daily as  needed for constipation.     Historical Provider, MD  Calcium Carbonate-Vitamin D (CALTRATE 600+D) 600-400 MG-UNIT per tablet Take 1 tablet by mouth every evening.     Historical Provider, MD  Camphor-Menthol-Methyl Sal (SALONPAS) 1.2-5.7-6.3 % PTCH Place 1 patch onto the skin daily as needed (For pain.).    Historical Provider, MD  cholecalciferol (VITAMIN D) 1000 UNITS tablet Take 2,000 Units by mouth every morning.    Historical Provider, MD  Docosanol (ABREVA) 10 % CREA Apply 1 application topically daily as needed (For sores on mouth.).    Historical Provider, MD  docusate sodium (COLACE) 100 MG capsule Take 100 mg by mouth every evening.    Historical Provider, MD  escitalopram (LEXAPRO) 5 MG tablet Take 5 mg by mouth every evening.     Historical Provider, MD  Flaxseed, Linseed, (FLAX SEED OIL PO) Take 1 tablet by mouth daily at 12 noon.     Historical Provider, MD  hydroxychloroquine (PLAQUENIL) 200 MG tablet Take 200 mg by mouth 2 (two) times daily.     Historical Provider, MD  isosorbide mononitrate (IMDUR) 30 MG 24 hr tablet Take 30 mg by mouth every morning.    Historical Provider, MD  Linaclotide Karlene Einstein) 145 MCG CAPS capsule Take 145 mcg by mouth every morning.     Historical Provider, MD  methocarbamol (ROBAXIN) 500 MG tablet Take 1 tablet (500 mg total) by mouth 3 (three) times daily as needed (For muscle spasms.). 01/01/13   Richardean Canal, PA-C  Multiple Vitamin (MULTIVITAMIN WITH MINERALS) TABS  tablet Take 1 tablet by mouth daily at 12 noon.    Historical Provider, MD  neomycin-bacitracin-polymyxin (NEOSPORIN) ointment Apply 1 application topically daily as needed (For cuts and scratches - has very sensitive skin.).    Historical Provider, MD  omeprazole (PRILOSEC OTC) 20 MG tablet Take 20 mg by mouth every morning.     Historical Provider, MD  polyvinyl alcohol (LIQUIFILM TEARS) 1.4 % ophthalmic solution Place 1 drop into both eyes as needed (Dry eyes.). 04/15/12   Cristal Ford, MD  promethazine (PHENERGAN) 12.5 MG tablet Take 12.5 mg by mouth every 6 (six) hours as needed for nausea (Patient takes with Tramadol).    Historical Provider, MD  sulfaSALAzine (AZULFIDINE) 500 MG tablet Take 500 mg by mouth 3 (three) times daily.     Historical Provider, MD  traMADol (ULTRAM) 50 MG tablet Take 2 tablets (100 mg total) by mouth every 6 (six) hours as needed for moderate pain. 01/01/13   Richardean Canal, PA-C  vitamin C (ASCORBIC ACID) 500 MG tablet Take 500 mg by mouth daily at 12 noon.     Historical Provider, MD  warfarin (COUMADIN) 3 MG tablet Take 3 mg by mouth See admin instructions. States takes Monday, Wednesday 4.5 mg Tuesday, Thursday Friday Sat Sun 3 mg    Historical Provider, MD   BP 117/48  Pulse 88  Resp 14  SpO2 100% Physical Exam  Nursing note and vitals reviewed. Constitutional: She is oriented to person, place, and time. She appears well-developed and well-nourished. No distress.  Pt lying in exam bed, alert, NAD  HENT:  Head: Normocephalic and atraumatic.  Eyes: Conjunctivae are normal. No scleral icterus.  Neck: Normal range of motion.  Cardiovascular: Normal rate, regular rhythm and normal heart sounds.   Pulses:      Radial pulses are 2+ on the right side.       Dorsalis pedis pulses are 2+ on the right side.  Posterior tibial pulses are 2+ on the right side.  Pulmonary/Chest: Effort normal and breath sounds normal. No respiratory distress. She has no wheezes.  She has no rales. She exhibits tenderness.  No respiratory distress, able to speak in full sentences w/o difficulty. Lungs: CTAB, tenderness along right anterior ribs 4-7 under right breast. No crepitus or flail chest.   Abdominal: Soft. Bowel sounds are normal. She exhibits no distension and no mass. There is no tenderness. There is no rebound and no guarding.  Soft, non-distended, non-tender.   Musculoskeletal: She exhibits tenderness. She exhibits no edema.  Neurological: She is alert and oriented to person, place, and time. She has normal strength. No cranial nerve deficit or sensory deficit. GCS eye subscore is 4. GCS verbal subscore is 5. GCS motor subscore is 6.  Skin: Skin is warm and dry. She is not diaphoretic.  Right upper arm: lateral aspect- 4x5cm skin tear    ED Course  Procedures (including critical care time) Labs Review Labs Reviewed  CBC - Abnormal; Notable for the following:    WBC 12.9 (*)    RBC 3.67 (*)    Hemoglobin 10.9 (*)    HCT 33.3 (*)    All other components within normal limits  BASIC METABOLIC PANEL - Abnormal; Notable for the following:    Sodium 136 (*)    Glucose, Bld 146 (*)    GFR calc non Af Amer 88 (*)    All other components within normal limits  PROTIME-INR - Abnormal; Notable for the following:    Prothrombin Time 22.2 (*)    INR 1.95 (*)    All other components within normal limits    Imaging Review Dg Ribs Unilateral W/chest Right  08/20/2013   CLINICAL DATA:  Pain post trauma  EXAM: RIGHT RIBS AND CHEST - 3+ VIEW  COMPARISON:  April 12, 2012  FINDINGS: Frontal chest as well as bilateral oblique and cone-down lower rib images were obtained. There is no edema or consolidation. Heart size and pulmonary vascularity are normal. Aorta is tortuous with atherosclerotic change in the aorta. No adenopathy. Patient is status post right total shoulder replacement. There is a small hiatal hernia.  There are old healed fractures of the left lateral  eighth and ninth ribs. There is no apparent pneumothorax or effusion. There is a nondisplaced fracture of the antral lateral right fourth rib. There is a slightly displaced fracture of the anterior right sixth rib.  IMPRESSION: Acute rib fractures on the right without pneumothorax or effusion. Old healed rib fractures on the left. Small hiatal hernia present.   Electronically Signed   By: Bretta Bang M.D.   On: 08/20/2013 13:32   Dg Hip Complete Right  08/20/2013   CLINICAL DATA:  Larey Seat.  Right hip pain.  EXAM: RIGHT HIP - COMPLETE 2+ VIEW  COMPARISON:  04/11/2012  FINDINGS: The left hip prosthesis is intact. The right hip hardware is intact. There is a gamma nail and dynamic hip screw transfixing the remote intertrochanteric fracture. No acute bony findings. The bony pelvis appears intact. No definite pelvic fractures. Extensive vascular calcifications are again noted.  IMPRESSION: No acute bony findings.   Electronically Signed   By: Loralie Champagne M.D.   On: 08/20/2013 13:29   Dg Femur Right  08/20/2013   CLINICAL DATA:  Right hip  EXAM: RIGHT FEMUR - 2 VIEW  COMPARISON:  None.  FINDINGS: No acute fracture or dislocation. Moderate osteoarthritis of the right hip. Old healed right  intertrochanteric fracture transfixed by an intra medullary nail and interlocking femoral neck screw. No lytic or sclerotic osseous lesion. Peripheral vascular atherosclerotic disease.  IMPRESSION: No acute osseous injury of the right femur.   Electronically Signed   By: Elige KoHetal  Patel   On: 08/20/2013 13:28   Ct Head Wo Contrast  08/20/2013   CLINICAL DATA:  Head injury  EXAM: CT HEAD WITHOUT CONTRAST  CT CERVICAL SPINE WITHOUT CONTRAST  TECHNIQUE: Multidetector CT imaging of the head and cervical spine was performed following the standard protocol without intravenous contrast. Multiplanar CT image reconstructions of the cervical spine were also generated.  COMPARISON:  04/11/2012  FINDINGS: CT HEAD FINDINGS  No skull fracture  is noted. Atherosclerotic calcifications of carotid siphon. Mild mucosal thickening bilateral ethmoid air cells. The mastoid air cells are unremarkable.  No intracranial hemorrhage, mass effect or midline shift. Stable atrophy and chronic white matter disease. There is interval old appearing infarct in right occipital lobe axial images 14 and 15. This is of indeterminate age. Clinical correlation is necessary. If recent ischemia suspected further correlation with brain MRI is recommended. Stable atrophy and chronic white matter disease.  CT CERVICAL SPINE FINDINGS  Axial images of the cervical spine shows no acute fracture or subluxation. Computer processed images shows no acute fracture. Again noted disc space flattening at C6-C7 with mild anterior and mild posterior spurring. Stable minimal anterolisthesis about 2 mm C7 on T1 vertebral body. Again noted degenerative changes C1-C2 articulation with pannus formation. Again noted calcified disc protrusion at C3-C4 and C4-C5 level. Mild disc protrusion at C5-C6 level. No prevertebral soft tissue swelling. Diffuse osteopenia. Cervical airway is patent.  IMPRESSION: 1. No acute intracranial abnormality. 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 Clinical correlation is necessary. She who 2. No cervical spine acute fracture or subluxation. Stable degenerative changes as described above.   Electronically Signed   By: Natasha MeadLiviu  Pop M.D.   On: 08/20/2013 13:55   Ct Cervical Spine Wo Contrast  08/20/2013   CLINICAL DATA:  Head injury  EXAM: CT HEAD WITHOUT CONTRAST  CT CERVICAL SPINE WITHOUT CONTRAST  TECHNIQUE: Multidetector CT imaging of the head and cervical spine was performed following the standard protocol without intravenous contrast. Multiplanar CT image reconstructions of the cervical spine were also generated.  COMPARISON:  04/11/2012  FINDINGS: CT HEAD FINDINGS  No skull fracture is noted. Atherosclerotic calcifications of carotid siphon. Mild  mucosal thickening bilateral ethmoid air cells. The mastoid air cells are unremarkable.  No intracranial hemorrhage, mass effect or midline shift. Stable atrophy and chronic white matter disease. There is interval old appearing infarct in right occipital lobe axial images 14 and 15. This is of indeterminate age. Clinical correlation is necessary. If recent ischemia suspected further correlation with brain MRI is recommended. Stable atrophy and chronic white matter disease.  CT CERVICAL SPINE FINDINGS  Axial images of the cervical spine shows no acute fracture or subluxation. Computer processed images shows no acute fracture. Again noted disc space flattening at C6-C7 with mild anterior and mild posterior spurring. Stable minimal anterolisthesis about 2 mm C7 on T1 vertebral body. Again noted degenerative changes C1-C2 articulation with pannus formation. Again noted calcified disc protrusion at C3-C4 and C4-C5 level. Mild disc protrusion at C5-C6 level. No prevertebral soft tissue swelling. Diffuse osteopenia. Cervical airway is patent.  IMPRESSION: 1. No acute intracranial abnormality. 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 Clinical correlation is necessary. She who 2. No cervical  spine acute fracture or subluxation. Stable degenerative changes as described above.   Electronically Signed   By: Natasha Mead M.D.   On: 08/20/2013 13:55   Ct Hip Right Wo Contrast  08/20/2013   CLINICAL DATA:  Fall.  Right hip pain.  EXAM: CT OF THE RIGHT HIP WITHOUT CONTRAST  TECHNIQUE: Multidetector CT imaging was performed according to the standard protocol. Multiplanar CT image reconstructions were also generated.  COMPARISON:  Plain films right hip earlier this same day.  FINDINGS: Right hip screw and long intra medullary nail for fixation of a remote healed subtrochanteric fracture identified. The right hip is located. No hip fracture is seen. The patient has an acute right inferior pubic ramus fracture.  Buckling of anterior cortex of the high right superior pubic ramus is consistent with nondisplaced fracture. The fracture is not visible on the comparison plain films. No other acute fracture is identified. There appears to be some subcutaneous hematoma over the right hip. Degenerative change lower lumbar spine is noted. Intrapelvic contents demonstrate atherosclerosis but no acute abnormality. The patient is status post hysterectomy.  IMPRESSION: Acute right inferior and high superior pubic rami fractures.  Negative for hip fracture with fixation hardware in place for remote healed fracture.   Electronically Signed   By: Drusilla Kanner M.D.   On: 08/20/2013 16:57     EKG Interpretation None      MDM   Final diagnoses:  Pubic bone fracture, right, closed, initial encounter  DM (diabetes mellitus), type 2, uncontrolled with complications  Essential hypertension    Pt is an 80yo female on coumadin for remote hx of DVTs brought to ED via EMS for trip and fall in bathroom. C/o right arm, rib, hip and leg pain. Denies LOC.  Lungs: CTAB with right anterior chest wall tenderness. No flail chest.  Abd- soft, non-distended, non-tender.  Right leg is neurovascularly in tact.    INR: 1.95,  CBC- elevated WBC, however no signs or symptoms concerning for infection. BMP: unremarkable  CT head and cervical spine: no acute abnormalities Right ribs: nondisplaced fracture of antral lateral 4th rib, slightly displaced fracture of anterior 6th rib Left ribs: old healed fractures of left lateral 8th and 9th ribs CXR: no apparent pneumothorax or effusion  Right hip, right femur: no acute osseous injury   Pt states she is doing "fair" feels some improvement of pain with 0.5mg  dilaudid, pt requesting to use restroom. Will give 2nd dose of 0.5mg  dilaudid and reevaluate.   3:19 PM  Pt attempted to ambulate with assistance of walker, unable to take more than 3 steps due to severe pain in right hip.  Discussed  with Dr. Jeraldine Loots, will get CT of right hip to check of occult fracture.    5:18 PM  CT right hip: signigicant for acute right inferior and high superior pubic rami fractures.  Discussed findings with pt, pt states she does not want to be placed in a rehab facility for pain management.   5:54 PM Will admit pt for tx of right hip and two rib fractures. PCP- Dr. Felipa Eth.  Case Management has been consulted to help establish home health tx when pt is discharged.    6:12 PM Consulted with Dr. Adela Glimpse, internal medicine, agreed to come evaluate pt for admission.       Junius Finner, PA-C 08/20/13 2008

## 2013-08-21 LAB — COMPREHENSIVE METABOLIC PANEL
ALT: 23 U/L (ref 0–35)
ANION GAP: 14 (ref 5–15)
AST: 30 U/L (ref 0–37)
Albumin: 2.8 g/dL — ABNORMAL LOW (ref 3.5–5.2)
Alkaline Phosphatase: 76 U/L (ref 39–117)
BUN: 7 mg/dL (ref 6–23)
CALCIUM: 8.7 mg/dL (ref 8.4–10.5)
CHLORIDE: 96 meq/L (ref 96–112)
CO2: 23 meq/L (ref 19–32)
Creatinine, Ser: 0.52 mg/dL (ref 0.50–1.10)
GFR calc Af Amer: >90 mL/min (ref >90)
GFR, EST NON AFRICAN AMERICAN: 87 mL/min — AB (ref >90)
Glucose, Bld: 100 mg/dL — ABNORMAL HIGH (ref 70–99)
Potassium: 3.6 mEq/L — ABNORMAL LOW (ref 3.7–5.3)
Sodium: 133 mEq/L — ABNORMAL LOW (ref 137–147)
Total Bilirubin: 0.6 mg/dL (ref 0.3–1.2)
Total Protein: 5.9 g/dL — ABNORMAL LOW (ref 6.0–8.3)

## 2013-08-21 LAB — CBC
HCT: 29.8 % — ABNORMAL LOW (ref 36.0–46.0)
Hemoglobin: 9.7 g/dL — ABNORMAL LOW (ref 12.0–15.0)
MCH: 28.8 pg (ref 26.0–34.0)
MCHC: 32.6 g/dL (ref 30.0–36.0)
MCV: 88.4 fL (ref 78.0–100.0)
PLATELETS: 151 10*3/uL (ref 150–400)
RBC: 3.37 MIL/uL — AB (ref 3.87–5.11)
RDW: 13.9 % (ref 11.5–15.5)
WBC: 7.9 10*3/uL (ref 4.0–10.5)

## 2013-08-21 LAB — MAGNESIUM: Magnesium: 1.7 mg/dL (ref 1.5–2.5)

## 2013-08-21 LAB — PROTIME-INR
INR: 2.42 — ABNORMAL HIGH (ref 0.00–1.49)
Prothrombin Time: 26.3 seconds — ABNORMAL HIGH (ref 11.6–15.2)

## 2013-08-21 LAB — GLUCOSE, CAPILLARY
Glucose-Capillary: 72 mg/dL (ref 70–99)
Glucose-Capillary: 129 mg/dL — ABNORMAL HIGH (ref 70–99)
Glucose-Capillary: 136 mg/dL — ABNORMAL HIGH (ref 70–99)
Glucose-Capillary: 124 mg/dL — ABNORMAL HIGH (ref 70–99)

## 2013-08-21 LAB — HEMOGLOBIN A1C
HEMOGLOBIN A1C: 6.1 % — AB (ref <5.7)
Mean Plasma Glucose: 128 mg/dL — ABNORMAL HIGH (ref <117)

## 2013-08-21 LAB — TSH: TSH: 3.33 u[IU]/mL (ref 0.350–4.500)

## 2013-08-21 LAB — PHOSPHORUS: PHOSPHORUS: 3.3 mg/dL (ref 2.3–4.6)

## 2013-08-21 MED ORDER — WARFARIN SODIUM 2.5 MG PO TABS
2.5000 mg | ORAL_TABLET | Freq: Once | ORAL | Status: AC
  Administered 2013-08-21: 2.5 mg via ORAL
  Filled 2013-08-21: qty 1

## 2013-08-21 MED ORDER — POTASSIUM CHLORIDE CRYS ER 20 MEQ PO TBCR
40.0000 meq | EXTENDED_RELEASE_TABLET | ORAL | Status: AC
  Administered 2013-08-21 (×2): 40 meq via ORAL
  Filled 2013-08-21 (×2): qty 2

## 2013-08-21 MED ORDER — DEXTROSE 5 % IV SOLN
1.0000 g | INTRAVENOUS | Status: AC
  Administered 2013-08-21 – 2013-08-23 (×3): 1 g via INTRAVENOUS
  Filled 2013-08-21 (×3): qty 10

## 2013-08-21 NOTE — Progress Notes (Signed)
ANTICOAGULATION CONSULT NOTE - Follow Up Consult  Pharmacy Consult for Coumadin Indication: DVT (history)  Allergies  Allergen Reactions  . Codeine Nausea And Vomiting, Swelling and Rash  . Hydrocodone Nausea And Vomiting, Swelling and Rash    "mouth peeled"  . Morphine And Related Other (See Comments)    hallucinations  . Other Nausea And Vomiting    Most pain meds other than tramadol. Tylenol, Toradol, and Robaxin are ok.    . Penicillins Hives  . Darvocet [Propoxyphene N-Acetaminophen] Hives and Nausea And Vomiting  . Demerol [Meperidine] Nausea And Vomiting  . Statins Other (See Comments)    INTOLERANT OF STATINS - messes up her legs and has muscle aches  . Tape Other (See Comments)    Needs paper tape - skin very fragile    Patient Measurements:    Vital Signs: Temp: 97.7 F (36.5 C) (07/02 1300) Temp src: Oral (07/02 0652) BP: 134/54 mmHg (07/02 1300) Pulse Rate: 78 (07/02 1300)  Labs:  Recent Labs  08/20/13 1211 08/21/13 0356  HGB 10.9* 9.7*  HCT 33.3* 29.8*  PLT 163 151  LABPROT 22.2* 26.3*  INR 1.95* 2.42*  CREATININE 0.51 0.52     Medications:  Prescriptions prior to admission  Medication Sig Dispense Refill  . Calcium Carbonate-Vitamin D (CALTRATE 600+D) 600-400 MG-UNIT per tablet Take 1 tablet by mouth every evening.       . Camphor-Menthol-Methyl Sal (SALONPAS) 1.2-5.7-6.3 % PTCH Place 1 patch onto the skin daily as needed (For pain.).      [name] cholecalciferol (VITAMIN D) 1000 UNITS tablet Take 2,000 Units by mouth every morning.      . docusate sodium (COLACE) 100 MG capsule Take 100 mg by mouth every evening.      . escitalopram (LEXAPRO) 5 MG tablet Take 5 mg by mouth every evening.       . Flaxseed, Linseed, (FLAX SEED OIL PO) Take 1 tablet by mouth daily at 12 noon.       . hydroxychloroquine (PLAQUENIL) 200 MG tablet Take 200 mg by mouth 2 (two) times daily.       . isosorbide mononitrate (IMDUR) 30 MG 24 hr tablet Take 30 mg by mouth every  morning.      . Linaclotide (LINZESS) 145 MCG CAPS capsule Take 145 mcg by mouth every morning.       . methocarbamol (ROBAXIN) 500 MG tablet Take 1 tablet (500 mg total) by mouth 3 (three) times daily as needed (For muscle spasms.).  40 tablet  0  . Multiple Vitamin (MULTIVITAMIN WITH MINERALS) TABS tablet Take 1 tablet by mouth daily at 12 noon.      . neomycin-bacitracin-polymyxin (NEOSPORIN) ointment Apply 1 application topically daily as needed (For cuts and scratches - has very sensitive skin.).      [name] omeprazole (PRILOSEC OTC) 20 MG tablet Take 20 mg by mouth every morning.       . polyvinyl alcohol (LIQUIFILM TEARS) 1.4 % ophthalmic solution Place 1 drop into both eyes as needed (Dry eyes.).  15 mL    . promethazine (PHENERGAN) 12.5 MG tablet Take 12.5 mg by mouth every 6 (six) hours as needed for nausea (Patient takes with Tramadol).      [name] sulfaSALAzine (AZULFIDINE) 500 MG tablet Take 500 mg by mouth 3 (three) times daily.       . traMADol (ULTRAM) 50 MG tablet Take 2 tablets (100 mg total) by mouth every 6 (six) hours as needed for moderate pain.  60 tablet  0  . vitamin C (ASCORBIC ACID) 500 MG tablet Take 500 mg by mouth daily at 12 noon.       . warfarin (COUMADIN) 3 MG tablet Take 3 mg by mouth See admin instructions. States takes Monday, Wednesday 4.5 mg Tuesday, Thursday Friday Sat Sun 3 mg        Assessment: 80 yo who presented after a fall that resulted in rib and rami fractures. She has been on coumadin for history of DVT. Her INR on admission was 1.95; has increased today to 2.42.   Home dose is currently 3mg  daily except 4.5mg  on Mon/Weds. Per granddaughter, Tammy, pt has INR checked every 2 weeks and dose is adjusted between 4.5mg  2-3 days/week depending on INR. For the last year, INR has been mostly between 1.9-3.2 on outpt checks.  Goal of Therapy:  INR 2-3 Monitor platelets by anticoagulation protocol: Yes   Plan:  Coumadin 2.5mg  PO x 1 tonight Continue daily  INR.  , Pharm.D., BCPS Clinical Pharmacist Pager [phone] 08/21/2013 2:49 PM

## 2013-08-21 NOTE — Evaluation (Signed)
Physical Therapy Evaluation Patient Details Name: ANAY WALTER MRN: [id number] DOB: 06-14-1930 Today's Date: 08/21/2013   History of Present Illness  pt presents post fall resulting in R superior and inferior pubic rami fxs, R UE abrasion, and R shoulder pain.    Clinical Impression  Pt very limited with mobility.  Pt unable to amb at this time despite encouragement, 2/2 to pain.  Pt states her family is looking into getting an aide at home to A pt as pt's great grandson works during the day and is only able to provide homemaking type assistance.  Pt will need 24hr care with HHPT/OT/Aide, a W/C with cushion and a 3-in-1 if she is adamant about d/c to home.  At this time feel safest and most appropriate plan would be to D/C to SNF to maximize independence and decrease burden of care.  Feel this will need to be an on going conversation with pt while on acute.  Will continue to follow.      Follow Up Recommendations SNF    Equipment Recommendations  3in1 (PT);Wheelchair (measurements PT);Wheelchair cushion (measurements PT)    Recommendations for Other Services OT consult     Precautions / Restrictions Precautions Precautions: Fall Restrictions Weight Bearing Restrictions: Yes RLE Weight Bearing: Weight bearing as tolerated      Mobility  Bed Mobility Overal bed mobility: Needs Assistance Bed Mobility: Supine to Sit     Supine to sit: Min assist;HOB elevated     General bed mobility comments: pt needs HOB elevated and use of bed rails to come to sitting with only MinA.   Transfers Overall transfer level: Needs assistance Equipment used: Rolling walker (2 wheeled) Transfers: Sit to/from [employer] Sit to Stand: Min assist Stand pivot transfers: Mod assist       General transfer comment: pt SPT without AD to 3-in-1 with ModA for support and balance.  pt stood from 3-in-1 with MinA and used RW to SPT to recliner.    Ambulation/Gait              General Gait Details: pt declined to amb 2/2 pain in R pelvis.    Stairs            Wheelchair Mobility    Modified Rankin (Stroke Patients Only)       Balance Overall balance assessment: Needs assistance;History of Falls Sitting-balance support: Single extremity supported;Feet supported Sitting balance-Leahy Scale: Poor Sitting balance - Comments: pt needs at least single UE support 2/2 pain in R pelvis.     Standing balance support: Bilateral upper extremity supported Standing balance-Leahy Scale: Poor Standing balance comment: pt needs UEs to maintain standing.                               Pertinent Vitals/Pain R hip/pelvis 7/10 during mobility.      Home Living Family/patient expects to be discharged to:: Private residence Living Arrangements: Other relatives Ivor Costa) Available Help at Discharge: Family;Available PRN/intermittently (Great Grandson works) Type of Home: House Home Access: Stairs to enter Entrance Stairs-Rails: Right;Left;Can reach both Secretary/administrator of Steps: 2 Home Layout: One level Home Equipment: Environmental consultant - 2 wheels;Cane - single point;Walker - 4 wheels      Prior Function Level of Independence: Needs assistance   Gait / Transfers Assistance Needed: Uses RW.    ADL's / Homemaking Assistance Needed: Ivor Costa performs some homemaking and cooking tasks.  Hand Dominance        Extremity/Trunk Assessment   Upper Extremity Assessment: RUE deficits/detail RUE Deficits / Details: pt only able to flex shoulder ~20 degrees and is having difficulty reaching her head.   RUE: Unable to fully assess due to pain       Lower Extremity Assessment: Generalized weakness;RLE deficits/detail RLE Deficits / Details: AROM WFL, but strength limited by pelvic pain.      Cervical / Trunk Assessment: Kyphotic  Communication   Communication: No difficulties  Cognition Arousal/Alertness: Awake/alert Behavior  During Therapy: WFL for tasks assessed/performed Overall Cognitive Status: Within Functional Limits for tasks assessed                      General Comments      Exercises        Assessment/Plan    PT Assessment Patient needs continued PT services  PT Diagnosis Difficulty walking;Acute pain   PT Problem List Decreased strength;Decreased activity tolerance;Decreased balance;Decreased mobility;Decreased knowledge of use of DME;Pain  PT Treatment Interventions DME instruction;Gait training;Stair training;Functional mobility training;Therapeutic activities;Therapeutic exercise;Balance training;Patient/family education   PT Goals (Current goals can be found in the Care Plan section) Acute Rehab PT Goals Patient Stated Goal: Home PT Goal Formulation: With patient Time For Goal Achievement: 09/04/13 Potential to Achieve Goals: Good    Frequency Min 3X/week   Barriers to discharge Decreased caregiver support pt's great grandson works during the day and is unable to provide personal assistance for pt.      Co-evaluation               End of Session Equipment Utilized During Treatment: Gait belt Activity Tolerance: Patient limited by pain Patient left: in chair;with call bell/phone within reach Nurse Communication: Mobility status         Time: 9628-3662 PT Time Calculation (min): 21 min   Charges:   PT Evaluation $Initial PT Evaluation Tier I: 1 Procedure PT Treatments $Therapeutic Activity: 8-22 mins   PT G CodesSunny Schlein, Blanchard [phone] 08/21/2013, 1:53 PM

## 2013-08-21 NOTE — Progress Notes (Signed)
Utilization review completed. Inaaya Vellucci, RN, BSN. 

## 2013-08-21 NOTE — Progress Notes (Signed)
Patient Demographics  [name], is a 80 y.o. female, DOB - 1930/11/27, DJS:[id number]  Admit date - 08/20/2013   Admitting Physician Therisa Doyne, MD  Outpatient Primary MD for the patient is Hoyle Sauer, MD  LOS - 1   Chief Complaint  Patient presents with  . Fall           Subjective:   [name] today has, No headache, No abdominal pain - No Nausea, No new weakness tingling or numbness, No Cough - SOB. Some right-sided pelvic pain and rib cage pain.    Assessment & Plan    1. Mechanical fall with Multiple right-sided rib fracture and pelvic fracture. For now supportive care with pain control, PT to evaluate, advance activity, may require placement which is refusing, discussed with granddaughter was eager for placement as well. Orthopedics Dr. Magnus Ivan to evaluate. Likely no surgery.    2. CAD (coronary artery disease), native coronary artery - PCI to RCA & Circumflex 2004 - currently stable continue home medications which includes indoor along with Coumadin.     3.History of DVT. On Coumadin dosed by pharmacy INR in range.     4. COPD - chronic with no acute issues, no wheezing on exam as needed oxygen and nebulizer treatments.     5. DM (diabetes mellitus), type 2 - not on any medications, check A1c if needed sliding scale.    Lab Results  Component Value Date   HGBA1C 6.8* 04/12/2012    CBG (last 3)   Recent Labs  08/20/13 2152 08/21/13 0642  GLUCAP 111* 72      6.HTN (hypertension) continue home medications - currently on Norvasc and Imdur which will be continued.    7. GERD. On PPI.    8.Leukocytosis due to UTI. Place on Rocephin. Follow cultures      Code Status: Full  Family Communication: Granddaughter over the phone who  requests possible SNF placement  Disposition Plan: To be decided but likely SNF placement if patient agrees   Procedures CT head, CT C-spine, CT pelvis and hip. Chest x-ray and rib series x-ray   Consults Dr Magnus Ivan Ortho   Medications  Scheduled Meds: . amLODipine  5 mg Oral Daily  . cholecalciferol  2,000 Units Oral Daily  . docusate sodium  100 mg Oral QPM  . escitalopram  5 mg Oral QPM  . hydroxychloroquine  200 mg Oral BID  . insulin aspart  0-5 Units Subcutaneous QHS  . insulin aspart  0-9 Units Subcutaneous TID WC  . isosorbide mononitrate  30 mg Oral Daily  . Linaclotide  145 mcg Oral Daily  . pantoprazole  40 mg Oral Daily  . potassium chloride  40 mEq Oral Q4H  . sulfaSALAzine  500 mg Oral TID  . Warfarin - Pharmacist Dosing Inpatient   Does not apply q1800   Continuous Infusions:  PRN Meds:.bisacodyl, bisacodyl, HYDROmorphone (DILAUDID) injection, methocarbamol, neomycin-bacitracin-polymyxin, ondansetron (ZOFRAN) IV, ondansetron, polyethylene glycol, traMADol  DVT Prophylaxis Coumadin  Lab Results  Component Value Date   INR 2.42* 08/21/2013   INR 1.95* 08/20/2013   INR 2.24* 01/01/2013     Lab Results  Component Value Date   PLT 151 08/21/2013    Antibiotics  Anti-infectives   Start     Dose/Rate Route Frequency Ordered Stop   08/20/13 2200  hydroxychloroquine (PLAQUENIL) tablet 200 mg     200 mg Oral 2 times daily 08/20/13 2045            Objective:   Filed Vitals:   08/20/13 1930 08/20/13 2056 08/21/13 0100 08/21/13 0652  BP: 117/48 149/56 141/85 131/70  Pulse: 88 87 78 68  Temp:  98.1 F (36.7 C) 98.4 F (36.9 C) 98.1 F (36.7 C)  TempSrc:  Oral Oral Oral  Resp: 14 16 18 18   SpO2: 100% 98% 98% 99%    Wt Readings from Last 3 Encounters:  12/27/12 52.617 kg (116 lb)  12/27/12 52.617 kg (116 lb)  12/24/12 52.617 kg (116 lb)     Intake/Output Summary (Last 24 hours) at 08/21/13 1001 Last data filed at 08/21/13 0900  Gross per  24 hour  Intake    340 ml  Output    300 ml  Net     40 ml     Physical Exam  Awake Alert, Oriented X 3, No new F.N deficits, Normal affect [place].AT,PERRAL Supple Neck,No JVD, No cervical lymphadenopathy appriciated.  Symmetrical Chest wall movement, Good air movement bilaterally, CTAB RRR,No Gallops,Rubs or new Murmurs, No Parasternal Heave +ve B.Sounds, Abd Soft, No tenderness, No organomegaly appriciated, No rebound - guarding or rigidity. No Cyanosis, Clubbing or edema, No new Rash or bruise      Data Review   Micro Results No results found for this or any previous visit (from the past 240 hour(s)).  Radiology Reports Dg Ribs Unilateral W/chest Right  08/20/2013   CLINICAL DATA:  Pain post trauma  EXAM: RIGHT RIBS AND CHEST - 3+ VIEW  COMPARISON:  April 12, 2012  FINDINGS: Frontal chest as well as bilateral oblique and cone-down lower rib images were obtained. There is no edema or consolidation. Heart size and pulmonary vascularity are normal. Aorta is tortuous with atherosclerotic change in the aorta. No adenopathy. Patient is status post right total shoulder replacement. There is a small hiatal hernia.  There are old healed fractures of the left lateral eighth and ninth ribs. There is no apparent pneumothorax or effusion. There is a nondisplaced fracture of the antral lateral right fourth rib. There is a slightly displaced fracture of the anterior right sixth rib.  IMPRESSION: Acute rib fractures on the right without pneumothorax or effusion. Old healed rib fractures on the left. Small hiatal hernia present.   Electronically Signed   By: Bretta BangWilliam  Woodruff M.D.   On: 08/20/2013 13:32   Dg Shoulder Right  08/20/2013   CLINICAL DATA:  Fall.  Right shoulder pain  EXAM: RIGHT SHOULDER - 2+ VIEW  COMPARISON:  11/10/2009  FINDINGS: Previous right shoulder arthroplasty. The right shoulder is located. There is no fracture or dislocation identified. Postsurgical changes involve the distal  aspect of the clavicle.  IMPRESSION: 1. No acute findings. 2. Prior right shoulder arthroplasty.   Electronically Signed   By: Signa Kellaylor  Stroud M.D.   On: 08/20/2013 20:31   Dg Hip Complete Right  08/20/2013   CLINICAL DATA:  Larey SeatFell.  Right hip pain.  EXAM: RIGHT HIP - COMPLETE 2+ VIEW  COMPARISON:  04/11/2012  FINDINGS: The left hip prosthesis is intact. The right hip hardware is intact. There is a gamma nail and dynamic hip screw transfixing the remote intertrochanteric fracture. No acute bony findings. The bony pelvis appears intact. No definite pelvic fractures. Extensive vascular  calcifications are again noted.  IMPRESSION: No acute bony findings.   Electronically Signed   By: Loralie Champagne M.D.   On: 08/20/2013 13:29   Dg Femur Right  08/20/2013   CLINICAL DATA:  Right hip  EXAM: RIGHT FEMUR - 2 VIEW  COMPARISON:  None.  FINDINGS: No acute fracture or dislocation. Moderate osteoarthritis of the right hip. Old healed right intertrochanteric fracture transfixed by an intra medullary nail and interlocking femoral neck screw. No lytic or sclerotic osseous lesion. Peripheral vascular atherosclerotic disease.  IMPRESSION: No acute osseous injury of the right femur.   Electronically Signed   By: Elige Ko   On: 08/20/2013 13:28   Ct Head Wo Contrast  08/20/2013   CLINICAL DATA:  Head injury  EXAM: CT HEAD WITHOUT CONTRAST  CT CERVICAL SPINE WITHOUT CONTRAST  TECHNIQUE: Multidetector CT imaging of the head and cervical spine was performed following the standard protocol without intravenous contrast. Multiplanar CT image reconstructions of the cervical spine were also generated.  COMPARISON:  04/11/2012  FINDINGS: CT HEAD FINDINGS  No skull fracture is noted. Atherosclerotic calcifications of carotid siphon. Mild mucosal thickening bilateral ethmoid air cells. The mastoid air cells are unremarkable.  No intracranial hemorrhage, mass effect or midline shift. Stable atrophy and chronic white matter disease. There  is interval old appearing infarct in right occipital lobe axial images 14 and 15. This is of indeterminate age. Clinical correlation is necessary. If recent ischemia suspected further correlation with brain MRI is recommended. Stable atrophy and chronic white matter disease.  CT CERVICAL SPINE FINDINGS  Axial images of the cervical spine shows no acute fracture or subluxation. Computer processed images shows no acute fracture. Again noted disc space flattening at C6-C7 with mild anterior and mild posterior spurring. Stable minimal anterolisthesis about 2 mm C7 on T1 vertebral body. Again noted degenerative changes C1-C2 articulation with pannus formation. Again noted calcified disc protrusion at C3-C4 and C4-C5 level. Mild disc protrusion at C5-C6 level. No prevertebral soft tissue swelling. Diffuse osteopenia. Cervical airway is patent.  IMPRESSION: 1. No acute intracranial abnormality. 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 Clinical correlation is necessary. She who 2. No cervical spine acute fracture or subluxation. Stable degenerative changes as described above.   Electronically Signed   By: Natasha Mead M.D.   On: 08/20/2013 13:55   Ct Cervical Spine Wo Contrast  08/20/2013   CLINICAL DATA:  Head injury  EXAM: CT HEAD WITHOUT CONTRAST  CT CERVICAL SPINE WITHOUT CONTRAST  TECHNIQUE: Multidetector CT imaging of the head and cervical spine was performed following the standard protocol without intravenous contrast. Multiplanar CT image reconstructions of the cervical spine were also generated.  COMPARISON:  04/11/2012  FINDINGS: CT HEAD FINDINGS  No skull fracture is noted. Atherosclerotic calcifications of carotid siphon. Mild mucosal thickening bilateral ethmoid air cells. The mastoid air cells are unremarkable.  No intracranial hemorrhage, mass effect or midline shift. Stable atrophy and chronic white matter disease. There is interval old appearing infarct in right occipital lobe axial  images 14 and 15. This is of indeterminate age. Clinical correlation is necessary. If recent ischemia suspected further correlation with brain MRI is recommended. Stable atrophy and chronic white matter disease.  CT CERVICAL SPINE FINDINGS  Axial images of the cervical spine shows no acute fracture or subluxation. Computer processed images shows no acute fracture. Again noted disc space flattening at C6-C7 with mild anterior and mild posterior spurring. Stable minimal anterolisthesis about 2 mm  C7 on T1 vertebral body. Again noted degenerative changes C1-C2 articulation with pannus formation. Again noted calcified disc protrusion at C3-C4 and C4-C5 level. Mild disc protrusion at C5-C6 level. No prevertebral soft tissue swelling. Diffuse osteopenia. Cervical airway is patent.  IMPRESSION: 1. No acute intracranial abnormality. 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 Clinical correlation is necessary. She who 2. No cervical spine acute fracture or subluxation. Stable degenerative changes as described above.   Electronically Signed   By: Natasha Mead M.D.   On: 08/20/2013 13:55   Ct Hip Right Wo Contrast  08/20/2013   CLINICAL DATA:  Fall.  Right hip pain.  EXAM: CT OF THE RIGHT HIP WITHOUT CONTRAST  TECHNIQUE: Multidetector CT imaging was performed according to the standard protocol. Multiplanar CT image reconstructions were also generated.  COMPARISON:  Plain films right hip earlier this same day.  FINDINGS: Right hip screw and long intra medullary nail for fixation of a remote healed subtrochanteric fracture identified. The right hip is located. No hip fracture is seen. The patient has an acute right inferior pubic ramus fracture. Buckling of anterior cortex of the high right superior pubic ramus is consistent with nondisplaced fracture. The fracture is not visible on the comparison plain films. No other acute fracture is identified. There appears to be some subcutaneous hematoma over the  right hip. Degenerative change lower lumbar spine is noted. Intrapelvic contents demonstrate atherosclerosis but no acute abnormality. The patient is status post hysterectomy.  IMPRESSION: Acute right inferior and high superior pubic rami fractures.  Negative for hip fracture with fixation hardware in place for remote healed fracture.   Electronically Signed   By: Drusilla Kanner M.D.   On: 08/20/2013 16:57   Dg Humerus Right  08/20/2013   CLINICAL DATA:  Status post fall with pain in the right shoulder and proximal humerus  EXAM: RIGHT HUMERUS - 2+ VIEW  COMPARISON:  Right shoulder series of August 20, 2013.  FINDINGS: The patient has undergone previous placement of a prosthetic humeral head. The prosthesis and the native bone are intact. There are stable degenerative changes of the [facility] joint and of the glenoid. The humeral shaft and distal humerus are intact where visualized.  IMPRESSION: There is no acute bony abnormality of the right humerus.   Electronically Signed   By: David  [place]   On: 08/20/2013 20:29    CBC  Recent Labs Lab 08/20/13 1211 08/21/13 0356  WBC 12.9* 7.9  HGB 10.9* 9.7*  HCT 33.3* 29.8*  PLT 163 151  MCV 90.7 88.4  MCH 29.7 28.8  MCHC 32.7 32.6  RDW 13.8 13.9    Chemistries   Recent Labs Lab 08/20/13 1211 08/21/13 0356  NA 136* 133*  K 3.8 3.6*  CL 99 96  CO2 24 23  GLUCOSE 146* 100*  BUN 7 7  CREATININE 0.51 0.52  CALCIUM 9.0 8.7  MG  --  1.7  AST  --  30  ALT  --  23  ALKPHOS  --  76  BILITOT  --  0.6   ------------------------------------------------------------------------------------------------------------------ CrCl is unknown because both a height and weight (above a minimum accepted value) are required for this calculation. ------------------------------------------------------------------------------------------------------------------ No results found for this basename: HGBA1C,  in the last 72  hours ------------------------------------------------------------------------------------------------------------------ No results found for this basename: CHOL, HDL, LDLCALC, TRIG, CHOLHDL, LDLDIRECT,  in the last 72 hours ------------------------------------------------------------------------------------------------------------------  Recent Labs  08/21/13 0356  TSH 3.330   ------------------------------------------------------------------------------------------------------------------ No results found  for this basename: VITAMINB12, FOLATE, FERRITIN, TIBC, IRON, RETICCTPCT,  in the last 72 hours  Coagulation profile  Recent Labs Lab 08/20/13 1211 08/21/13 0356  INR 1.95* 2.42*    No results found for this basename: DDIMER,  in the last 72 hours  Cardiac Enzymes No results found for this basename: CK, CKMB, TROPONINI, MYOGLOBIN,  in the last 168 hours ------------------------------------------------------------------------------------------------------------------ No components found with this basename: POCBNP,      Time Spent in minutes  35   SINGH,PRASHANT K M.D on 08/21/2013 at 10:01 AM  Between 7am to 7pm - Pager - [phone]  After 7pm go to [email or web address] - password [email or web address]  And look for the night coverage person covering for me after hours  [facility] Office  [phone]   **Disclaimer: This note may have been dictated with voice recognition software. Similar sounding words can inadvertently be transcribed and this note may contain transcription errors which may not have been corrected upon publication of note.**

## 2013-08-21 NOTE — Progress Notes (Signed)
Patient ID: [name], female   DOB: [birth year], 80 y.o.   MRN: [id number] [name] is well-known to me.  I did review her films this am.  She has right-sided superior/inferior pubic rami fractures.  From an ortho standpoint, she can WBAT with assistance due to her weakness and significant fall risk.  Will likely need SNF placement.  I will stop by this evening to see her.

## 2013-08-22 ENCOUNTER — Encounter (HOSPITAL_COMMUNITY): Payer: Self-pay | Admitting: General Practice

## 2013-08-22 LAB — PROTIME-INR
INR: 2.97 — ABNORMAL HIGH (ref 0.00–1.49)
Prothrombin Time: 30.9 seconds — ABNORMAL HIGH (ref 11.6–15.2)

## 2013-08-22 LAB — POTASSIUM: POTASSIUM: 4.9 meq/L (ref 3.7–5.3)

## 2013-08-22 LAB — GLUCOSE, CAPILLARY
GLUCOSE-CAPILLARY: 176 mg/dL — AB (ref 70–99)
Glucose-Capillary: 128 mg/dL — ABNORMAL HIGH (ref 70–99)
Glucose-Capillary: 131 mg/dL — ABNORMAL HIGH (ref 70–99)
Glucose-Capillary: 121 mg/dL — ABNORMAL HIGH (ref 70–99)

## 2013-08-22 MED ORDER — POLYETHYLENE GLYCOL 3350 17 G PO PACK
17.0000 g | PACK | Freq: Two times a day (BID) | ORAL | Status: DC
  Administered 2013-08-22 – 2013-08-24 (×2): 17 g via ORAL
  Filled 2013-08-22 (×8): qty 1

## 2013-08-22 MED ORDER — BISACODYL 10 MG RE SUPP: 10.0000 mg | Freq: Once | RECTAL | Status: DC

## 2013-08-22 MED ORDER — BISACODYL 5 MG PO TBEC
10.0000 mg | DELAYED_RELEASE_TABLET | Freq: Every day | ORAL | Status: DC
  Administered 2013-08-22: 10 mg via ORAL
  Filled 2013-08-22 (×2): qty 2

## 2013-08-22 MED ORDER — WARFARIN SODIUM 1 MG PO TABS
1.5000 mg | ORAL_TABLET | Freq: Once | ORAL | Status: AC
  Administered 2013-08-22: 1.5 mg via ORAL
  Filled 2013-08-22: qty 1

## 2013-08-22 NOTE — ED Provider Notes (Signed)
  Medical screening examination/treatment/procedure(s) were performed with a  non-physician practitioner and as supervising physician I Also evaluated the patient, coordinating her care.    EKG Interpretation   Date/Time:  Wednesday August 20 2013 11:14:37 EDT Ventricular Rate:  80 PR Interval:  159 QRS Duration: 86 QT Interval:  414 QTC Calculation: 478 R Axis:   147 Text Interpretation:  Sinus rhythm Probable lateral infarct, age  indeterminate ED PHYSICIAN INTERPRETATION AVAILABLE IN [facility]  Confirmed by TEST, Record ([id number]) on 08/22/2013 10:09:34 AM      This patient presenting after a fall with pain in multiple areas on multiple fractures, including ribs, pelvis. She was unable to ambulate, required admission for further evaluation and management.   Gerhard Munch, MD 08/22/13 [phone]

## 2013-08-22 NOTE — Care Management Note (Signed)
CARE MANAGEMENT NOTE 08/22/2013  Patient:  [name], [name]   Account Number:  [id number]  Date Initiated:  08/20/2013  Documentation initiated by:  Michel Bickers  Subjective/Objective Assessment:   S/P Mechanical Fall with mutliple fx     Action/Plan:   HH / or SNF   Anticipated DC Date:  08/23/2013   Anticipated DC Plan:  HOME W HOME HEALTH SERVICES      DC Planning Services  CM consult      [facility] Choice  HOME HEALTH  DURABLE MEDICAL EQUIPMENT   Choice offered to / List presented to:  C-1 Patient   DME arranged  [facility]  3-N-1      DME agency  [facility]     HH arranged  HH-2 PT  HH-3 OT  HH-4 NURSE'S AIDE  HH-6 SOCIAL WORKER      HH agency  [facility]   Status of service:  Completed, signed off Medicare Important Message given?  NA - LOS <3 / Initial given by admissions (If response is "NO", the following Medicare IM given date fields will be blank) Date Medicare IM given:  08/20/2013 Medicare IM given by:   Date Additional Medicare IM given:   Additional Medicare IM given by:    Discharge Disposition:  HOME W HOME HEALTH SERVICES  Per UR Regulation:  Reviewed for med. necessity/level of care/duration of stay

## 2013-08-22 NOTE — Progress Notes (Signed)
Patient granddaughter- Tammy-called re: D/C planning- still trying to find 24 hr assist. Dr Thedore Mins notified -- informed patient D/C home before Monday due to no other assist at home a safety issue. Granddaughter flying home Monday from [place] - called her back to confirm.

## 2013-08-22 NOTE — Progress Notes (Signed)
Physical Therapy Treatment Patient Details Name: [name] MRN: [id number] DOB: 1930-02-28 Today's Date: 08/22/2013    History of Present Illness pt presents post fall resulting in R superior and inferior pubic rami fxs, R UE abrasion, and R shoulder pain.      PT Comments    Patient continues to refuse PTs recommendation of SNF. Patient is wanting to return home with assist from her great grandson. She will be without assistance some during the day as her great grandson works. Spoke with patient regarding hygiene but she insisted that she would be able to do on her own. Shared concerns with Case Manager. Will continue to follow  Follow Up Recommendations  SNF     Equipment Recommendations  3in1 (PT);Wheelchair (measurements PT);Wheelchair cushion (measurements PT)    Recommendations for Other Services       Precautions / Restrictions Precautions Precautions: Fall Restrictions RLE Weight Bearing: Weight bearing as tolerated    Mobility  Bed Mobility Overal bed mobility: Needs Assistance Bed Mobility: Supine to Sit     Supine to sit: Min guard     General bed mobility comments: Cues for techique. Patient did utilize rails. Will need to attempt without rails next session  Transfers Overall transfer level: Needs assistance Equipment used: Rolling walker (2 wheeled)   Sit to Stand: Min guard         General transfer comment: Cues for hand placement. Did not assist physically  Ambulation/Gait Ambulation/Gait assistance: Min assist Ambulation Distance (Feet): 15 Feet Assistive device: Rolling walker (2 wheeled) Gait Pattern/deviations: Step-to pattern;Decreased step length - left;Decreased step length - right   Gait velocity interpretation: <1.8 ft/sec, indicative of risk for recurrent falls General Gait Details: Patient reluctant to ambulate however patient did walk to door. Complaining of pain with WBing.    Stairs            Wheelchair Mobility     Modified Rankin (Stroke Patients Only)       Balance                                    Cognition Arousal/Alertness: Awake/alert Behavior During Therapy: WFL for tasks assessed/performed Overall Cognitive Status: Within Functional Limits for tasks assessed                      Exercises      General Comments        Pertinent Vitals/Pain Increased pain with ambulation but subsided at rest.     Home Living                      Prior Function            PT Goals (current goals can now be found in the care plan section) Progress towards PT goals: Progressing toward goals    Frequency  Min 3X/week    PT Plan Current plan remains appropriate    Co-evaluation             End of Session Equipment Utilized During Treatment: Gait belt Activity Tolerance: Patient limited by pain Patient left: in chair;with call bell/phone within reach     Time: 0841-0905 PT Time Calculation (min): 24 min  Charges:  $Gait Training: 8-22 mins $Therapeutic Activity: 8-22 mins                    G  Codes:      Fredrich Birks 08/22/2013, 10:32 AM 08/22/2013 Fredrich Birks PTA [phone] pager [phone] office

## 2013-08-22 NOTE — Progress Notes (Addendum)
Patient c/o left neck ache , SOB.  Muscle relaxer given, Repositioned,elevated HOB.. O2 saturation = 94 RA. Lungs coarse. Resp 20/min. O2 1l/m applied for comfort.139/43.hr=84. Discomfort increases with turning head. Reported to oncoming RN.

## 2013-08-22 NOTE — Progress Notes (Signed)
States feeling better with breathing.Requests Trammadol- given. Continue to monitor.

## 2013-08-22 NOTE — Progress Notes (Signed)
ANTICOAGULATION CONSULT NOTE - Follow Up Consult  Pharmacy Consult for Coumadin Indication: DVT (history)  Allergies  Allergen Reactions  . Codeine Nausea And Vomiting, Swelling and Rash  . Hydrocodone Nausea And Vomiting, Swelling and Rash    "mouth peeled"  . Morphine And Related Other (See Comments)    hallucinations  . Other Nausea And Vomiting    Most pain meds other than tramadol. Tylenol, Toradol, and Robaxin are ok.    . Penicillins Hives  . Darvocet [Propoxyphene N-Acetaminophen] Hives and Nausea And Vomiting  . Demerol [Meperidine] Nausea And Vomiting  . Statins Other (See Comments)    INTOLERANT OF STATINS - messes up her legs and has muscle aches  . Tape Other (See Comments)    Needs paper tape - skin very fragile    Patient Measurements:    Vital Signs: Temp: 97.9 F (36.6 C) (07/03 0507) Temp src: Oral (07/03 0507) BP: 119/44 mmHg (07/03 0507) Pulse Rate: 78 (07/03 0507)  Labs:  Recent Labs  08/20/13 1211 08/21/13 0356 08/22/13 0551  HGB 10.9* 9.7*  --   HCT 33.3* 29.8*  --   PLT 163 151  --   LABPROT 22.2* 26.3* 30.9*  INR 1.95* 2.42* 2.97*  CREATININE 0.51 0.52  --      Medications:  Prescriptions prior to admission  Medication Sig Dispense Refill  . Calcium Carbonate-Vitamin D (CALTRATE 600+D) 600-400 MG-UNIT per tablet Take 1 tablet by mouth every evening.       . Camphor-Menthol-Methyl Sal (SALONPAS) 1.2-5.7-6.3 % PTCH Place 1 patch onto the skin daily as needed (For pain.).      [name] cholecalciferol (VITAMIN D) 1000 UNITS tablet Take 2,000 Units by mouth every morning.      . docusate sodium (COLACE) 100 MG capsule Take 100 mg by mouth every evening.      . escitalopram (LEXAPRO) 5 MG tablet Take 5 mg by mouth every evening.       . Flaxseed, Linseed, (FLAX SEED OIL PO) Take 1 tablet by mouth daily at 12 noon.       . hydroxychloroquine (PLAQUENIL) 200 MG tablet Take 200 mg by mouth 2 (two) times daily.       . isosorbide mononitrate  (IMDUR) 30 MG 24 hr tablet Take 30 mg by mouth every morning.      . Linaclotide (LINZESS) 145 MCG CAPS capsule Take 145 mcg by mouth every morning.       . methocarbamol (ROBAXIN) 500 MG tablet Take 1 tablet (500 mg total) by mouth 3 (three) times daily as needed (For muscle spasms.).  40 tablet  0  . Multiple Vitamin (MULTIVITAMIN WITH MINERALS) TABS tablet Take 1 tablet by mouth daily at 12 noon.      . neomycin-bacitracin-polymyxin (NEOSPORIN) ointment Apply 1 application topically daily as needed (For cuts and scratches - has very sensitive skin.).      [name] omeprazole (PRILOSEC OTC) 20 MG tablet Take 20 mg by mouth every morning.       . polyvinyl alcohol (LIQUIFILM TEARS) 1.4 % ophthalmic solution Place 1 drop into both eyes as needed (Dry eyes.).  15 mL    . promethazine (PHENERGAN) 12.5 MG tablet Take 12.5 mg by mouth every 6 (six) hours as needed for nausea (Patient takes with Tramadol).      [name] sulfaSALAzine (AZULFIDINE) 500 MG tablet Take 500 mg by mouth 3 (three) times daily.       . traMADol (ULTRAM) 50 MG tablet Take 2  tablets (100 mg total) by mouth every 6 (six) hours as needed for moderate pain.  60 tablet  0  . vitamin C (ASCORBIC ACID) 500 MG tablet Take 500 mg by mouth daily at 12 noon.       . warfarin (COUMADIN) 3 MG tablet Take 3 mg by mouth See admin instructions. States takes Monday, Wednesday 4.5 mg Tuesday, Thursday Friday Sat Sun 3 mg        Assessment: 80 yo who presented after a fall that resulted in rib and rami fractures. She has been on coumadin for history of DVT. Her INR on admission was 1.95; has increased today to 2.97 in two days.   Home dose is currently 3mg  daily except 4.5mg  on Mon/Weds. Per granddaughter, Tammy, pt has INR checked every 2 weeks and dose is adjusted between 4.5mg  2-3 days/week depending on INR. For the last year, INR has been mostly between 1.9-3.2 on outpt checks.  Goal of Therapy:  INR 2-3 Monitor platelets by anticoagulation protocol:  Yes   Plan:  Coumadin 1.5mg  PO x 1 tonight Continue daily INR.  , PharmD.  Clinical Pharmacist Pager [phone]

## 2013-08-22 NOTE — Progress Notes (Signed)
Patient Demographics  [name], is a 80 y.o. female, DOB - 1930/11/09, BPZ:[id number]  Admit date - 08/20/2013   Admitting Physician Therisa Doyne, MD  Outpatient Primary MD for the patient is Hoyle Sauer, MD  LOS - 2   Chief Complaint  Patient presents with  . Fall           Subjective:   [name] today has, No headache, No abdominal pain - No Nausea, No new weakness tingling or numbness, No Cough - SOB. Some right-sided pelvic pain and rib cage pain.    Assessment & Plan    1. Mechanical fall with Multiple right-sided rib fracture and pelvic fracture. For now supportive care with pain control, PT to evaluate, advance activity, may require placement which is refusing, discussed with granddaughter was eager for placement as well. Discussed with Dr. Magnus Ivan no surgical intervention. We'll try to advance activity. Will arrange for home health and equipment likely discharge in the morning.    2. CAD (coronary artery disease), native coronary artery - PCI to RCA & Circumflex 2004 - currently stable continue home medications which includes indoor along with Coumadin.     3.History of DVT. On Coumadin dosed by pharmacy INR in range.     4. COPD - chronic with no acute issues, no wheezing on exam as needed oxygen and nebulizer treatments.     5. DM (diabetes mellitus), type 2 - not on any medications, check A1c if needed sliding scale.    Lab Results  Component Value Date   HGBA1C 6.1* 08/21/2013    CBG (last 3)   Recent Labs  08/21/13 1624 08/21/13 2131 08/22/13 0620  GLUCAP 136* 124* 128*      6.HTN (hypertension) continue home medications - currently on Norvasc and Imdur which will be continued.    7. GERD. On PPI.    8.Leukocytosis due to UTI.  Place on Rocephin. Follow cultures which are pending.      Code Status: Full  Family Communication: Granddaughter over the phone who requests possible SNF placement  Disposition Plan: To be decided but likely SNF placement if patient agrees   Procedures CT head, CT C-spine, CT pelvis and hip. Chest x-ray and rib series x-ray   Consults Dr Magnus Ivan [facility]   Medications  Scheduled Meds: . amLODipine  5 mg Oral Daily  . bisacodyl  10 mg Oral Daily  . bisacodyl  10 mg Rectal Once  . cefTRIAXone (ROCEPHIN)  IV  1 g Intravenous Q24H  . cholecalciferol  2,000 Units Oral Daily  . docusate sodium  100 mg Oral QPM  . escitalopram  5 mg Oral QPM  . hydroxychloroquine  200 mg Oral BID  . insulin aspart  0-5 Units Subcutaneous QHS  . insulin aspart  0-9 Units Subcutaneous TID WC  . isosorbide mononitrate  30 mg Oral Daily  . Linaclotide  145 mcg Oral Daily  . pantoprazole  40 mg Oral Daily  . polyethylene glycol  17 g Oral BID  . sulfaSALAzine  500 mg Oral TID  . Warfarin - Pharmacist Dosing Inpatient   Does not apply q1800   Continuous Infusions:  PRN Meds:.HYDROmorphone (DILAUDID) injection, methocarbamol, neomycin-bacitracin-polymyxin, ondansetron (ZOFRAN) IV, ondansetron, traMADol  DVT Prophylaxis Coumadin  Lab Results  Component Value Date   INR 2.97* 08/22/2013   INR 2.42* 08/21/2013   INR 1.95* 08/20/2013     Lab Results  Component Value Date   PLT 151 08/21/2013    Antibiotics     Anti-infectives   Start     Dose/Rate Route Frequency Ordered Stop   08/21/13 1130  cefTRIAXone (ROCEPHIN) 1 g in dextrose 5 % 50 mL IVPB     1 g 100 mL/hr over 30 Minutes Intravenous Every 24 hours 08/21/13 1007 08/24/13 1129   08/20/13 2200  hydroxychloroquine (PLAQUENIL) tablet 200 mg     200 mg Oral 2 times daily 08/20/13 2045            Objective:   Filed Vitals:   08/21/13 1300 08/21/13 2053 08/22/13 0232 08/22/13 0507  BP: 134/54 126/52  119/44  Pulse: 78 82  78  Temp:  97.7 F (36.5 C) 98.8 F (37.1 C)  97.9 F (36.6 C)  TempSrc:  Oral  Oral  Resp: 18 16  16   SpO2: 99% 90% 97% 90%    Wt Readings from Last 3 Encounters:  12/27/12 52.617 kg (116 lb)  12/27/12 52.617 kg (116 lb)  12/24/12 52.617 kg (116 lb)     Intake/Output Summary (Last 24 hours) at 08/22/13 0902 Last data filed at 08/21/13 1300  Gross per 24 hour  Intake    360 ml  Output      0 ml  Net    360 ml     Physical Exam  Awake Alert, Oriented X 3, No new F.N deficits, Normal affect [place].AT,PERRAL Supple Neck,No JVD, No cervical lymphadenopathy appriciated.  Symmetrical Chest wall movement, Good air movement bilaterally, CTAB RRR,No Gallops,Rubs or new Murmurs, No Parasternal Heave +ve B.Sounds, Abd Soft, No tenderness, No organomegaly appriciated, No rebound - guarding or rigidity. No Cyanosis, Clubbing or edema, No new Rash or bruise      Data Review   Micro Results Recent Results (from the past 240 hour(s))  URINE CULTURE     Status: None   Collection Time    08/20/13 10:20 PM      Result Value Ref Range Status   Specimen Description URINE, CLEAN CATCH   Final   Special Requests NONE   Final   Culture  Setup Time     Final   Value: 08/20/2013 23:04     Performed at [facility] Count PENDING   Incomplete   Culture     Final   Value: Culture reincubated for better growth     Performed at [facility]   Report Status PENDING   Incomplete    Radiology Reports Dg Ribs Unilateral W/chest Right  08/20/2013   CLINICAL DATA:  Pain post trauma  EXAM: RIGHT RIBS AND CHEST - 3+ VIEW  COMPARISON:  April 12, 2012  FINDINGS: Frontal chest as well as bilateral oblique and cone-down lower rib images were obtained. There is no edema or consolidation. Heart size and pulmonary vascularity are normal. Aorta is tortuous with atherosclerotic change in the aorta. No adenopathy. Patient is status post right total shoulder replacement. There is a small hiatal  hernia.  There are old healed fractures of the left lateral eighth and ninth ribs. There is no apparent pneumothorax or effusion. There is a nondisplaced fracture of the antral lateral right fourth rib. There is a slightly displaced fracture of the anterior right sixth rib.  IMPRESSION: Acute rib fractures on  the right without pneumothorax or effusion. Old healed rib fractures on the left. Small hiatal hernia present.   Electronically Signed   By: Bretta BangWilliam  Woodruff M.D.   On: 08/20/2013 13:32   Dg Shoulder Right  08/20/2013   CLINICAL DATA:  Fall.  Right shoulder pain  EXAM: RIGHT SHOULDER - 2+ VIEW  COMPARISON:  11/10/2009  FINDINGS: Previous right shoulder arthroplasty. The right shoulder is located. There is no fracture or dislocation identified. Postsurgical changes involve the distal aspect of the clavicle.  IMPRESSION: 1. No acute findings. 2. Prior right shoulder arthroplasty.   Electronically Signed   By: Signa Kellaylor  Stroud M.D.   On: 08/20/2013 20:31   Dg Hip Complete Right  08/20/2013   CLINICAL DATA:  Larey SeatFell.  Right hip pain.  EXAM: RIGHT HIP - COMPLETE 2+ VIEW  COMPARISON:  04/11/2012  FINDINGS: The left hip prosthesis is intact. The right hip hardware is intact. There is a gamma nail and dynamic hip screw transfixing the remote intertrochanteric fracture. No acute bony findings. The bony pelvis appears intact. No definite pelvic fractures. Extensive vascular calcifications are again noted.  IMPRESSION: No acute bony findings.   Electronically Signed   By: Loralie ChampagneMark  Gallerani M.D.   On: 08/20/2013 13:29   Dg Femur Right  08/20/2013   CLINICAL DATA:  Right hip  EXAM: RIGHT FEMUR - 2 VIEW  COMPARISON:  None.  FINDINGS: No acute fracture or dislocation. Moderate osteoarthritis of the right hip. Old healed right intertrochanteric fracture transfixed by an intra medullary nail and interlocking femoral neck screw. No lytic or sclerotic osseous lesion. Peripheral vascular atherosclerotic disease.  IMPRESSION: No  acute osseous injury of the right femur.   Electronically Signed   By: Elige KoHetal  Patel   On: 08/20/2013 13:28   Ct Head Wo Contrast  08/20/2013   CLINICAL DATA:  Head injury  EXAM: CT HEAD WITHOUT CONTRAST  CT CERVICAL SPINE WITHOUT CONTRAST  TECHNIQUE: Multidetector CT imaging of the head and cervical spine was performed following the standard protocol without intravenous contrast. Multiplanar CT image reconstructions of the cervical spine were also generated.  COMPARISON:  04/11/2012  FINDINGS: CT HEAD FINDINGS  No skull fracture is noted. Atherosclerotic calcifications of carotid siphon. Mild mucosal thickening bilateral ethmoid air cells. The mastoid air cells are unremarkable.  No intracranial hemorrhage, mass effect or midline shift. Stable atrophy and chronic white matter disease. There is interval old appearing infarct in right occipital lobe axial images 14 and 15. This is of indeterminate age. Clinical correlation is necessary. If recent ischemia suspected further correlation with brain MRI is recommended. Stable atrophy and chronic white matter disease.  CT CERVICAL SPINE FINDINGS  Axial images of the cervical spine shows no acute fracture or subluxation. Computer processed images shows no acute fracture. Again noted disc space flattening at C6-C7 with mild anterior and mild posterior spurring. Stable minimal anterolisthesis about 2 mm C7 on T1 vertebral body. Again noted degenerative changes C1-C2 articulation with pannus formation. Again noted calcified disc protrusion at C3-C4 and C4-C5 level. Mild disc protrusion at C5-C6 level. No prevertebral soft tissue swelling. Diffuse osteopenia. Cervical airway is patent.  IMPRESSION: 1. No acute intracranial abnormality. 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 Clinical correlation is necessary. She who 2. No cervical spine acute fracture or subluxation. Stable degenerative changes as described above.   Electronically Signed   By: Natasha MeadLiviu   Pop M.D.   On: 08/20/2013 13:55   Ct Cervical Spine Wo Contrast  08/20/2013   CLINICAL DATA:  Head injury  EXAM: CT HEAD WITHOUT CONTRAST  CT CERVICAL SPINE WITHOUT CONTRAST  TECHNIQUE: Multidetector CT imaging of the head and cervical spine was performed following the standard protocol without intravenous contrast. Multiplanar CT image reconstructions of the cervical spine were also generated.  COMPARISON:  04/11/2012  FINDINGS: CT HEAD FINDINGS  No skull fracture is noted. Atherosclerotic calcifications of carotid siphon. Mild mucosal thickening bilateral ethmoid air cells. The mastoid air cells are unremarkable.  No intracranial hemorrhage, mass effect or midline shift. Stable atrophy and chronic white matter disease. There is interval old appearing infarct in right occipital lobe axial images 14 and 15. This is of indeterminate age. Clinical correlation is necessary. If recent ischemia suspected further correlation with brain MRI is recommended. Stable atrophy and chronic white matter disease.  CT CERVICAL SPINE FINDINGS  Axial images of the cervical spine shows no acute fracture or subluxation. Computer processed images shows no acute fracture. Again noted disc space flattening at C6-C7 with mild anterior and mild posterior spurring. Stable minimal anterolisthesis about 2 mm C7 on T1 vertebral body. Again noted degenerative changes C1-C2 articulation with pannus formation. Again noted calcified disc protrusion at C3-C4 and C4-C5 level. Mild disc protrusion at C5-C6 level. No prevertebral soft tissue swelling. Diffuse osteopenia. Cervical airway is patent.  IMPRESSION: 1. No acute intracranial abnormality. 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 Clinical correlation is necessary. She who 2. No cervical spine acute fracture or subluxation. Stable degenerative changes as described above.   Electronically Signed   By: Natasha Mead M.D.   On: 08/20/2013 13:55   Ct Hip Right Wo  Contrast  08/20/2013   CLINICAL DATA:  Fall.  Right hip pain.  EXAM: CT OF THE RIGHT HIP WITHOUT CONTRAST  TECHNIQUE: Multidetector CT imaging was performed according to the standard protocol. Multiplanar CT image reconstructions were also generated.  COMPARISON:  Plain films right hip earlier this same day.  FINDINGS: Right hip screw and long intra medullary nail for fixation of a remote healed subtrochanteric fracture identified. The right hip is located. No hip fracture is seen. The patient has an acute right inferior pubic ramus fracture. Buckling of anterior cortex of the high right superior pubic ramus is consistent with nondisplaced fracture. The fracture is not visible on the comparison plain films. No other acute fracture is identified. There appears to be some subcutaneous hematoma over the right hip. Degenerative change lower lumbar spine is noted. Intrapelvic contents demonstrate atherosclerosis but no acute abnormality. The patient is status post hysterectomy.  IMPRESSION: Acute right inferior and high superior pubic rami fractures.  Negative for hip fracture with fixation hardware in place for remote healed fracture.   Electronically Signed   By: Drusilla Kanner M.D.   On: 08/20/2013 16:57   Dg Humerus Right  08/20/2013   CLINICAL DATA:  Status post fall with pain in the right shoulder and proximal humerus  EXAM: RIGHT HUMERUS - 2+ VIEW  COMPARISON:  Right shoulder series of August 20, 2013.  FINDINGS: The patient has undergone previous placement of a prosthetic humeral head. The prosthesis and the native bone are intact. There are stable degenerative changes of the [facility] joint and of the glenoid. The humeral shaft and distal humerus are intact where visualized.  IMPRESSION: There is no acute bony abnormality of the right humerus.   Electronically Signed   By: David  [place]   On: 08/20/2013 20:29    CBC  Recent Labs Lab 08/20/13 1211 08/21/13 0356  WBC 12.9* 7.9  HGB 10.9* 9.7*  HCT 33.3* 29.8*   PLT 163 151  MCV 90.7 88.4  MCH 29.7 28.8  MCHC 32.7 32.6  RDW 13.8 13.9    Chemistries   Recent Labs Lab 08/20/13 1211 08/21/13 0356 08/22/13 0551  NA 136* 133*  --   K 3.8 3.6* 4.9  CL 99 96  --   CO2 24 23  --   GLUCOSE 146* 100*  --   BUN 7 7  --   CREATININE 0.51 0.52  --   CALCIUM 9.0 8.7  --   MG  --  1.7  --   AST  --  30  --   ALT  --  23  --   ALKPHOS  --  76  --   BILITOT  --  0.6  --    ------------------------------------------------------------------------------------------------------------------ CrCl is unknown because both a height and weight (above a minimum accepted value) are required for this calculation. ------------------------------------------------------------------------------------------------------------------  Recent Labs  08/21/13 1130  HGBA1C 6.1*   ------------------------------------------------------------------------------------------------------------------ No results found for this basename: CHOL, HDL, LDLCALC, TRIG, CHOLHDL, LDLDIRECT,  in the last 72 hours ------------------------------------------------------------------------------------------------------------------  Recent Labs  08/21/13 0356  TSH 3.330   ------------------------------------------------------------------------------------------------------------------ No results found for this basename: VITAMINB12, FOLATE, FERRITIN, TIBC, IRON, RETICCTPCT,  in the last 72 hours  Coagulation profile  Recent Labs Lab 08/20/13 1211 08/21/13 0356 08/22/13 0551  INR 1.95* 2.42* 2.97*    No results found for this basename: DDIMER,  in the last 72 hours  Cardiac Enzymes No results found for this basename: CK, CKMB, TROPONINI, MYOGLOBIN,  in the last 168 hours ------------------------------------------------------------------------------------------------------------------ No components found with this basename: POCBNP,      Time Spent in minutes   35   SINGH,PRASHANT K M.D on 08/22/2013 at 9:02 AM  Between 7am to 7pm - Pager - [phone]  After 7pm go to [email or web address] - password [email or web address]  And look for the night coverage person covering for me after hours  [facility] Office  [phone]   **Disclaimer: This note may have been dictated with voice recognition software. Similar sounding words can inadvertently be transcribed and this note may contain transcription errors which may not have been corrected upon publication of note.**

## 2013-08-23 LAB — GLUCOSE, CAPILLARY
GLUCOSE-CAPILLARY: 148 mg/dL — AB (ref 70–99)
GLUCOSE-CAPILLARY: 134 mg/dL — AB (ref 70–99)
Glucose-Capillary: 144 mg/dL — ABNORMAL HIGH (ref 70–99)
Glucose-Capillary: 154 mg/dL — ABNORMAL HIGH (ref 70–99)

## 2013-08-23 LAB — PROTIME-INR
INR: 4.15 — ABNORMAL HIGH (ref 0.00–1.49)
Prothrombin Time: 40.1 seconds — ABNORMAL HIGH (ref 11.6–15.2)

## 2013-08-23 MED ORDER — ONDANSETRON HCL 4 MG PO TABS: 4.0000 mg | ORAL_TABLET | Freq: Three times a day (TID) | ORAL | Status: DC | PRN

## 2013-08-23 MED ORDER — POLYETHYLENE GLYCOL 3350 17 G PO PACK: 17.0000 g | PACK | Freq: Every day | ORAL | Status: DC | PRN

## 2013-08-23 MED ORDER — TRAMADOL HCL 50 MG PO TABS: 100.0000 mg | ORAL_TABLET | Freq: Four times a day (QID) | ORAL | Status: DC | PRN

## 2013-08-23 MED ORDER — WARFARIN SODIUM 3 MG PO TABS: 3.0000 mg | ORAL_TABLET | ORAL | Status: DC

## 2013-08-23 MED ORDER — FREESTYLE SYSTEM KIT: 1.0000 | PACK | Freq: Three times a day (TID) | Status: DC

## 2013-08-23 NOTE — Progress Notes (Signed)
ANTICOAGULATION CONSULT NOTE - Follow Up Consult  Pharmacy Consult for Coumadin Indication: DVT (history)  Allergies  Allergen Reactions  . Codeine Nausea And Vomiting, Swelling and Rash  . Hydrocodone Nausea And Vomiting, Swelling and Rash    "mouth peeled"  . Morphine And Related Other (See Comments)    hallucinations  . Other Nausea And Vomiting    Most pain meds other than tramadol. Tylenol, Toradol, and Robaxin are ok.    . Penicillins Hives  . Darvocet [Propoxyphene N-Acetaminophen] Hives and Nausea And Vomiting  . Demerol [Meperidine] Nausea And Vomiting  . Statins Other (See Comments)    INTOLERANT OF STATINS - messes up her legs and has muscle aches  . Tape Other (See Comments)    Needs paper tape - skin very fragile    Patient Measurements:    Vital Signs: Temp: 98 F (36.7 C) (07/04 0440) Temp src: Oral (07/04 0440) BP: 182/62 mmHg (07/04 0440) Pulse Rate: 89 (07/04 0440)  Labs:  Recent Labs  08/20/13 1211 08/21/13 0356 08/22/13 0551 08/23/13 0450  HGB 10.9* 9.7*  --   --   HCT 33.3* 29.8*  --   --   PLT 163 151  --   --   LABPROT 22.2* 26.3* 30.9* 40.1*  INR 1.95* 2.42* 2.97* 4.15*  CREATININE 0.51 0.52  --   --     The CrCl is unknown because both a height and weight (above a minimum accepted value) are required for this calculation.   Medications:  Scheduled:  . amLODipine  5 mg Oral Daily  . bisacodyl  10 mg Oral Daily  . bisacodyl  10 mg Rectal Once  . cefTRIAXone (ROCEPHIN)  IV  1 g Intravenous Q24H  . cholecalciferol  2,000 Units Oral Daily  . docusate sodium  100 mg Oral QPM  . escitalopram  5 mg Oral QPM  . hydroxychloroquine  200 mg Oral BID  . insulin aspart  0-5 Units Subcutaneous QHS  . insulin aspart  0-9 Units Subcutaneous TID WC  . isosorbide mononitrate  30 mg Oral Daily  . Linaclotide  145 mcg Oral Daily  . pantoprazole  40 mg Oral Daily  . polyethylene glycol  17 g Oral BID  . sulfaSALAzine  500 mg Oral TID  .  Warfarin - Pharmacist Dosing Inpatient   Does not apply q1800    Assessment: 80 yo who presented after a fall that resulted in rib and rami fractures. She has been on coumadin for history of DVT. Her INR on admission was 1.95.  INR has steadily increased despite decreased Coumadin doses.  INR today is 4.15.  Home dose is currently 3mg  daily except 4.5mg  on Mon/Weds. Per granddaughter, Tammy, pt has INR checked every 2 weeks and dose is adjusted between 4.5mg  2-3 days/week depending on INR. For the last year, INR has been mostly between 1.9-3.2 on outpt checks.  Goal of Therapy:  INR 2-3 Monitor platelets by anticoagulation protocol: Yes   Plan:  No Coumadin today. Continue daily INR.  , Pharm.D., BCPS Clinical Pharmacist Pager [phone] 08/23/2013 8:32 AM

## 2013-08-23 NOTE — Progress Notes (Signed)
Physical Therapy Treatment Patient Details Name: [name] MRN: [id number] DOB: 1930/08/03 Today's Date: 08/23/2013    History of Present Illness pt presents post fall resulting in R superior and inferior pubic rami fxs, R UE abrasion, and R shoulder pain.      PT Comments    Pt very painful today limiting all mobility.  Pt thought she had taken pain meds, but had actually been her muscle relaxer.  RN made aware and brought pain meds.  Lengthy discussion with pt, pt's sister, pt's granddaughter by phone, and RN about D/C planning as pt does not feel she is ready to go to home.  Discussed that pt needed medical necessity to remain in hospital and that going to a rehab is the other alternative, however pt continues to be adamantly against a rehab stay.  RN paging MD during this time to discuss D/C timeframe.  At this point pt is planning to D/C to sister's home tomorrow.  Pt states too painful to attempt steps, will need to practice single step to enter sister's home.  Will continue to follow.    Follow Up Recommendations  SNF     Equipment Recommendations  3in1 (PT);Wheelchair (measurements PT);Wheelchair cushion (measurements PT)    Recommendations for Other Services       Precautions / Restrictions Precautions Precautions: Fall Restrictions Weight Bearing Restrictions: Yes RLE Weight Bearing: Weight bearing as tolerated    Mobility  Bed Mobility Overal bed mobility: Needs Assistance Bed Mobility: Supine to Sit     Supine to sit: Mod assist     General bed mobility comments: pt with increased pain today requiring A for R LE and to bring trunk up to sitting.    Transfers Overall transfer level: Needs assistance Equipment used: Rolling walker (2 wheeled) Transfers: Sit to/from [employer] Sit to Stand: Min guard Stand pivot transfers: Min assist       General transfer comment: A with management for RW during pivot.    Ambulation/Gait                  Stairs            Wheelchair Mobility    Modified Rankin (Stroke Patients Only)       Balance Overall balance assessment: Needs assistance Sitting-balance support: Single extremity supported   Sitting balance - Comments: pt needs at least single UE support 2/2 pain in R pelvis.     Standing balance support: Bilateral upper extremity supported Standing balance-Leahy Scale: Poor                      Cognition Arousal/Alertness: Awake/alert Behavior During Therapy: WFL for tasks assessed/performed Overall Cognitive Status: Within Functional Limits for tasks assessed                      Exercises      General Comments        Pertinent Vitals/Pain 8/10.  RN arrived with meds.      Home Living                      Prior Function            PT Goals (current goals can now be found in the care plan section) Acute Rehab PT Goals Time For Goal Achievement: 09/04/13 Potential to Achieve Goals: Good Progress towards PT goals: Progressing toward goals    Frequency  Min 3X/week  PT Plan Current plan remains appropriate    Co-evaluation             End of Session   Activity Tolerance: Patient limited by pain Patient left: in chair;with call bell/phone within reach;with family/visitor present     Time: 2620-3559 PT Time Calculation (min): 50 min  Charges:  $Therapeutic Activity: 38-52 mins                    G CodesSunny Schlein, [address] [phone] 08/23/2013, 3:20 PM

## 2013-08-23 NOTE — Progress Notes (Signed)
Patient and family decided to on d/c tomorrow but wanted another INR drawn. Notified Dr. Thedore Mins and order was placed for a repeat INR in AM prior to D/C.

## 2013-08-23 NOTE — Progress Notes (Signed)
Patient and family concerned about INR of 4.15. Patient wanted to stay until Monday. Notified Dr. Thedore Mins to inform him of patient's concern. Dr. Thedore Mins was okay with patient staying until Monday and to inform patient and her family that her insurance may or may not pay for the extra days.

## 2013-08-23 NOTE — Discharge Summary (Addendum)
[name], is a 80 y.o. female  DOB 1931/01/12  MRN [id number].  Admission date:  08/20/2013  Admitting Physician  Toy Baker, MD  Discharge Date:  08/25/2013   Primary MD  Tivis Ringer, MD  Recommendations for primary care physician for things to follow:   Check CBC, BMP and INR in 2-3 days and adjust Coumadin dose as needed.   Follow glycemic control   Admission Diagnosis  DM (diabetes mellitus), type 2, uncontrolled with complications [786.75] Essential hypertension [401.9] Pubic bone fracture, right, closed, initial encounter [808.2]   Discharge Diagnosis  DM (diabetes mellitus), type 2, uncontrolled with complications [449.20] Essential hypertension [401.9] Pubic bone fracture, right, closed, initial encounter [808.2]    Active Problems:   COPD (chronic obstructive pulmonary disease)   HTN (hypertension)   CAD (coronary artery disease), native coronary artery - PCI to RCA & Circumflex 2004   DM (diabetes mellitus), type 2, uncontrolled with complications   DVT, Right Femoral, "chronic" -  with extension to the peroneal   Pubic bone fracture   Closed rib fracture   Fracture of multiple pubic rami      Past Medical History  Diagnosis Date  . History of  Angina     Treated with PCI in 2004; no active Symptoms.  . Hiatal hernia   . GERD (gastroesophageal reflux disease)   . Iron deficiency anemia   . Barrett esophagus   . Osteoporosis   . HLD (hyperlipidemia)     Statin intolerant  . Internal hemorrhoids   . Diverticulosis   . Pain     RIGHT SHOULDER WITH LIMITED ROM--STATES PREVIOUS RT TOTAL SHOULDDER REPLACEMENT-BUT STILL HAS PROBLEMS WITH SHOULDER  . CAD S/P percutaneous coronary angioplasty 2004    PCI-RCA (2.1m x 158mTaxus DES)& Cx (2.75 mm x 14 mm Cypher DES)  . PAD (peripheral artery  disease) 1994    History of right iliac stenting, fem-fem bypass in 94; aortobifem bypass/ AAA repair - November 2004; latest ABIs of been roughly 1 bilaterally, followed by Dr. EaDonnetta Hutching. HTN (hypertension)     sometimes  . Shortness of breath     MILD- WITH EXERTION. chronic  . Nocturia   . DVT (deep venous thrombosis)     BLE  . COPD (chronic obstructive pulmonary disease)   . Pneumonia     "several times"  . History of blood transfusion     "not sure why"  . History of stomach ulcers   . Migraine     "used to have them; not anymore" (08/22/2013)  . Stroke     "showed up on XRAY" (08/22/2013)  . Osteoarthritis of both hips      status post multiple hip operations, recent fall with fracture.    . Marland KitchenJD (degenerative joint disease)   . Rheumatoid arthritis   . Fall from slip, trip, or stumble 08/20/2013    Past Surgical History  Procedure Laterality Date  . Back surgery      x 2  .  Appendectomy  1950  . Total shoulder replacement Right 2011  . Cholecystectomy      several yrs ago  . Cataract extraction w/ intraocular lens  implant, bilateral Bilateral 2001-2002  . Total abdominal hysterectomy  1965  . Upper gastrointestinal endoscopy  multiple    w/bx, hiatal hernia, Barretts', esophageal polyp 1x  . Colonoscopy  05/09/2004; 06/08/1999    internal hemorrhoids; diverticulosis  . Shoulder open rotator cuff repair Bilateral     2 times on left  . Lumbar spine surgery  October 2012    spinal stenosis, Dr. Lorin Mercy  . Abdominal aortic aneurysm repair  2004    AortoBi-Fem Bypass  . Carpal tunnel release Left 04/2011  . Total hip arthroplasty  01/26/2012    Procedure: TOTAL HIP ARTHROPLASTY ANTERIOR APPROACH;  Surgeon: Mcarthur Rossetti, MD;  Location: [facility];  Service: Orthopedics;  Laterality: Left;  Left Total Hip Arthroplasty, Anterior Approach (C-Arm)  . Intramedullary (im) nail intertrochanteric Right 04/12/2012    Procedure: INTRAMEDULLARY (IM) NAIL INTERTROCHANTRIC Right;   Surgeon: Mcarthur Rossetti, MD;  Location: [facility];  Service: Orthopedics;  Laterality: Right;  . Doppler echocardiography  06/06/2012    EF 50-60%.  Mild LVH, grade one diastolic function; normal PA pressures and CVP  . Lower extremity venous dopplers  08/16/2012    Partially occlusive right femoral vein thrombosis, also noted in right peroneal vein; no thrombosis in left sided veins.  . Nm myoview ltd  April 2014    Bowel artifact, No ischemia or infarction  . Femoral-femoral bypass graft  1994    Following right iliac stenting  . Doppler echocardiography  06/06/2012    Mild LVH, EF 55-60%.  Normal wall motion.  Mildly elevated LVEDP with grade one diastolic function.  Mild LA dilatation.  Normal PA pressures.  . Tubal ligation  1956  . Excision/release bursa hip Right 12/27/2012    Procedure: RIGHT HIP BURSECTOMY AND COMPRESSION SCREW REMOVAL;  Surgeon: Mcarthur Rossetti, MD;  Location: [facility];  Service: Orthopedics;  Laterality: Right;  . Joint replacement    . Coronary angioplasty with stent placement  2004    RCA: Taxus DES 2.5 mm x 12 mm; Circumflex: Cypher DES 2.75 mm 14 mm  . Cardiac catheterization  October 2005    Patent RCA stent, 20% ISR of Cx stent; EF 50-55%       History of present illness and  Hospital Course:     Kindly see H&P for history of present illness and admission details, please review complete Labs, Consult reports and Test reports for all details in brief  HPI - this [name] 80 year old female with history of CAD, heart hernia, GERD, and deficiency anemia which is chronic, osteoporosis, dyslipidemia, hip fracture, hypertension, chronic DVT on Coumadin, COPD who lives at home presented to the ER after a mechanical fall in her bathroom after which she started experiencing right-sided pelvic pain and rib pain. Came to the ER where she was diagnosed with pelvic fracture and right-sided rib fractures.   Hospital Course    1. Mechanical fall with Multiple  right-sided rib fracture and pelvic fracture. For now supportive care with pain control, PT to evaluate, advance activity, may require placement which is refusing, discussed with granddaughter was eager for placement as well. Discussed with Dr. Ninfa Linden no surgical intervention. Improved with supportive care.   Increase activity as tolerated with supervision and assistance at home, home PT OT RN range, patient has refused placement. Now going sister's home  who is RN, granddaughter by mouth a knows the plan. She could not make the patient agreeable for SNF placement.    2. CAD (coronary artery disease), native coronary artery - PCI to RCA & Circumflex 2004 - currently stable continue home medications which includes imdur along with Coumadin.      3.History of DVT. On Coumadin , INR was > 4 for 2 days, coumadin was held, now 2.9, will hold coumadin another day then get INR checked by PCP in 1 day and get dose adjusted.     4. COPD - chronic with no acute issues, no wheezing on exam as needed oxygen and nebulizer treatments.     5. Prediabetic. A1c 6.1. Placed on low carb diet, given glucometer for Accu-Cheks for the next few days. To monitor glycemic control. Doubt she will require any medications.      6.HTN (hypertension) continue home medications - on Imdur, was on Norvasc which was discontinued by PCP. Will follow with PCP and get dictations adjusted as needed.     7. GERD. On PPI.      8.Leukocytosis due to UTI. Treated with empiric Rocephin.      Discharge Condition: Stable   Follow UP  Follow-up Information   Follow up with Kim. (Someone from [facility] will contact you concerning start date and time for physical therapy.)    Contact information:   [address] [place] [address] [phone]       Follow up with Tivis Ringer, MD. Schedule an appointment as soon as possible for a visit in 1 day. (INR  rechecked)    Specialty:  Internal Medicine   Contact information:   [facility] [place] [address] [phone]       Follow up with Mcarthur Rossetti, MD. Schedule an appointment as soon as possible for a visit in 1 week.   Specialty:  Orthopedic Surgery   Contact information:   [place] [address] [phone]         Discharge Instructions  and  Discharge Medications         Discharge Instructions   Discharge instructions    Complete by:  As directed   Follow with Primary MD Tivis Ringer, MD in 1 days   Get CBC, CMP, INR,   checked  by Primary MD next visit.    Activity: As tolerated with Full fall precautions use walker/cane & assistance as needed   Disposition Home with HHPT, refused SNF   Diet: Heart Healthy - Low carb  Accuchecks 4 times/day, Once in AM empty stomach and then before each meal. Log in all results and show them to your Prim.MD in 3 days. If any glucose reading is under 80 or above 300 call your Prim MD immidiately. Follow Low glucose instructions for glucose under 80 as instructed.    For Heart failure patients - Check your Weight same time everyday, if you gain over 2 pounds, or you develop in leg swelling, experience more shortness of breath or chest pain, call your Primary MD immediately. Follow Cardiac Low Salt Diet and 1.8 lit/day fluid restriction.   On your next visit with her primary care physician please Get Medicines reviewed and adjusted.  Please request your Prim.MD to go over all Hospital Tests and Procedure/Radiological results at the follow up, please get all Hospital records sent to your Prim MD by signing hospital release before you go home.  If you experience worsening of your admission symptoms, develop shortness of breath, life threatening emergency, suicidal or homicidal thoughts you must seek medical attention immediately by calling 911 or  calling your MD immediately  if symptoms less severe.  You Must read complete instructions/literature along with all the possible adverse reactions/side effects for all the Medicines you take and that have been prescribed to you. Take any new Medicines after you have completely understood and accpet all the possible adverse reactions/side effects.   Do not drive, operating heavy machinery, perform activities at heights, swimming or participation in water activities or provide baby sitting services if your were admitted for syncope or siezures until you have seen by Primary MD or a Neurologist and advised to do so again.  Do not drive when taking Pain medications.    Do not take more than prescribed Pain, Sleep and Anxiety Medications  Special Instructions: If you have smoked or chewed Tobacco  in the last 2 yrs please stop smoking, stop any regular Alcohol  and or any Recreational drug use.  Wear Seat belts while driving.   Please note  You were cared for by a hospitalist during your hospital stay. If you have any questions about your discharge medications or the care you received while you were in the hospital after you are discharged, you can call the unit and asked to speak with the hospitalist on call if the hospitalist that took care of you is not available. Once you are discharged, your primary care physician will handle any further medical issues. Please note that NO REFILLS for any discharge medications will be authorized once you are discharged, as it is imperative that you return to your primary care physician (or establish a relationship with a primary care physician if you do not have one) for your aftercare needs so that they can reassess your need for medications and monitor your lab values.  Follow with Primary MD Tivis Ringer, MD in 1 days   Get CBC, CMP, INR,   checked  by Primary MD next visit.    Activity: As tolerated with Full fall precautions use walker/cane &  assistance as needed   Disposition Home with HHPT, refused SNF   Diet: Heart Healthy - Low carb  Accuchecks 4 times/day, Once in AM empty stomach and then before each meal. Log in all results and show them to your Prim.MD in 3 days. If any glucose reading is under 80 or above 300 call your Prim MD immidiately. Follow Low glucose instructions for glucose under 80 as instructed.    For Heart failure patients - Check your Weight same time everyday, if you gain over 2 pounds, or you develop in leg swelling, experience more shortness of breath or chest pain, call your Primary MD immediately. Follow Cardiac Low Salt Diet and 1.8 lit/day fluid restriction.   On your next visit with her primary care physician please Get Medicines reviewed and adjusted.  Please request your Prim.MD to go over all Hospital Tests and Procedure/Radiological results at the follow up, please get all Hospital records sent to your Prim MD by signing hospital release before you go home.   If you experience worsening of your admission symptoms, develop shortness of breath, life threatening emergency, suicidal or homicidal thoughts you must seek medical attention immediately by calling 911 or calling your MD immediately  if symptoms less severe.  You Must read complete instructions/literature along with all the possible adverse reactions/side effects for all the Medicines you  take and that have been prescribed to you. Take any new Medicines after you have completely understood and accpet all the possible adverse reactions/side effects.   Do not drive, operating heavy machinery, perform activities at heights, swimming or participation in water activities or provide baby sitting services if your were admitted for syncope or siezures until you have seen by Primary MD or a Neurologist and advised to do so again.  Do not drive when taking Pain medications.    Do not take more than prescribed Pain, Sleep and Anxiety  Medications  Special Instructions: If you have smoked or chewed Tobacco  in the last 2 yrs please stop smoking, stop any regular Alcohol  and or any Recreational drug use.  Wear Seat belts while driving.   Please note  You were cared for by a hospitalist during your hospital stay. If you have any questions about your discharge medications or the care you received while you were in the hospital after you are discharged, you can call the unit and asked to speak with the hospitalist on call if the hospitalist that took care of you is not available. Once you are discharged, your primary care physician will handle any further medical issues. Please note that NO REFILLS for any discharge medications will be authorized once you are discharged, as it is imperative that you return to your primary care physician (or establish a relationship with a primary care physician if you do not have one) for your aftercare needs so that they can reassess your need for medications and monitor your lab values.     Discharge patient    Complete by:  As directed      Increase activity slowly    Complete by:  As directed             Medication List         CALTRATE 600+D 600-400 MG-UNIT per tablet  Generic drug:  Calcium Carbonate-Vitamin D  Take 1 tablet by mouth every evening.     cholecalciferol 1000 UNITS tablet  Commonly known as:  VITAMIN D  Take 2,000 Units by mouth every morning.     docusate sodium 100 MG capsule  Commonly known as:  COLACE  Take 100 mg by mouth every evening.     escitalopram 5 MG tablet  Commonly known as:  LEXAPRO  Take 5 mg by mouth every evening.     FLAX SEED OIL PO  Take 1 tablet by mouth daily at 12 noon.     glucose monitoring kit monitoring kit  1 each by Does not apply route 4 (four) times daily - after meals and at bedtime. 1 month Diabetic Testing Supplies for QAC-QHS accuchecks.Any brand OK     hydroxychloroquine 200 MG tablet  Commonly known as:  PLAQUENIL    Take 200 mg by mouth 2 (two) times daily.     isosorbide mononitrate 30 MG 24 hr tablet  Commonly known as:  IMDUR  Take 30 mg by mouth every morning.     LINZESS 145 MCG Caps capsule  Generic drug:  Linaclotide  Take 145 mcg by mouth every morning.     methocarbamol 500 MG tablet  Commonly known as:  ROBAXIN  Take 1 tablet (500 mg total) by mouth 3 (three) times daily as needed (For muscle spasms.).     multivitamin with minerals Tabs tablet  Take 1 tablet by mouth daily at 12 noon.     neomycin-bacitracin-polymyxin ointment  Commonly known  as:  NEOSPORIN  Apply 1 application topically daily as needed (For cuts and scratches - has very sensitive skin.).     omeprazole 20 MG tablet  Commonly known as:  PRILOSEC OTC  Take 20 mg by mouth every morning.     ondansetron 4 MG tablet  Commonly known as:  ZOFRAN  Take 1 tablet (4 mg total) by mouth every 8 (eight) hours as needed for nausea.     polyethylene glycol packet  Commonly known as:  MIRALAX / GLYCOLAX  Take 17 g by mouth daily as needed.     polyvinyl alcohol 1.4 % ophthalmic solution  Commonly known as:  LIQUIFILM TEARS  Place 1 drop into both eyes as needed (Dry eyes.).     promethazine 12.5 MG tablet  Commonly known as:  PHENERGAN  Take 12.5 mg by mouth every 6 (six) hours as needed for nausea (Patient takes with Tramadol).     SALONPAS 1.2-5.7-6.3 % Ptch  Generic drug:  Camphor-Menthol-Methyl Sal  Place 1 patch onto the skin daily as needed (For pain.).     sulfaSALAzine 500 MG tablet  Commonly known as:  AZULFIDINE  Take 500 mg by mouth 3 (three) times daily.     traMADol 50 MG tablet  Commonly known as:  ULTRAM  Take 2 tablets (100 mg total) by mouth every 6 (six) hours as needed for moderate pain.     vitamin C 500 MG tablet  Commonly known as:  ASCORBIC ACID  Take 500 mg by mouth daily at 12 noon.     warfarin 3 MG tablet  Commonly known as:  COUMADIN  Take 1 tablet (3 mg total) by mouth See  admin instructions. States takes Monday, Wednesday 4.5 mg Tuesday, Thursday Friday Sat Sun 3 mg. Get your INR checked by PCP in 1 day.  Start taking on:  08/26/2013          Diet and Activity recommendation: See Discharge Instructions above   Consults obtained - Ortho Ninfa Linden   Major procedures and Radiology Reports - PLEASE review detailed and final reports for all details, in brief -       Dg Ribs Unilateral W/chest Right  08/20/2013   CLINICAL DATA:  Pain post trauma  EXAM: RIGHT RIBS AND CHEST - 3+ VIEW  COMPARISON:  April 12, 2012  FINDINGS: Frontal chest as well as bilateral oblique and cone-down lower rib images were obtained. There is no edema or consolidation. Heart size and pulmonary vascularity are normal. Aorta is tortuous with atherosclerotic change in the aorta. No adenopathy. Patient is status post right total shoulder replacement. There is a small hiatal hernia.  There are old healed fractures of the left lateral eighth and ninth ribs. There is no apparent pneumothorax or effusion. There is a nondisplaced fracture of the antral lateral right fourth rib. There is a slightly displaced fracture of the anterior right sixth rib.  IMPRESSION: Acute rib fractures on the right without pneumothorax or effusion. Old healed rib fractures on the left. Small hiatal hernia present.   Electronically Signed   By: Lowella Grip M.D.   On: 08/20/2013 13:32   Dg Shoulder Right  08/20/2013   CLINICAL DATA:  Fall.  Right shoulder pain  EXAM: RIGHT SHOULDER - 2+ VIEW  COMPARISON:  11/10/2009  FINDINGS: Previous right shoulder arthroplasty. The right shoulder is located. There is no fracture or dislocation identified. Postsurgical changes involve the distal aspect of the clavicle.  IMPRESSION: 1. No acute  findings. 2. Prior right shoulder arthroplasty.   Electronically Signed   By: Kerby Moors M.D.   On: 08/20/2013 20:31   Dg Hip Complete Right  08/20/2013   CLINICAL DATA:  [place].  Right  hip pain.  EXAM: RIGHT HIP - COMPLETE 2+ VIEW  COMPARISON:  04/11/2012  FINDINGS: The left hip prosthesis is intact. The right hip hardware is intact. There is a gamma nail and dynamic hip screw transfixing the remote intertrochanteric fracture. No acute bony findings. The bony pelvis appears intact. No definite pelvic fractures. Extensive vascular calcifications are again noted.  IMPRESSION: No acute bony findings.   Electronically Signed   By: Kalman Jewels M.D.   On: 08/20/2013 13:29   Dg Femur Right  08/20/2013   CLINICAL DATA:  Right hip  EXAM: RIGHT FEMUR - 2 VIEW  COMPARISON:  None.  FINDINGS: No acute fracture or dislocation. Moderate osteoarthritis of the right hip. Old healed right intertrochanteric fracture transfixed by an intra medullary nail and interlocking femoral neck screw. No lytic or sclerotic osseous lesion. Peripheral vascular atherosclerotic disease.  IMPRESSION: No acute osseous injury of the right femur.   Electronically Signed   By: Kathreen Devoid   On: 08/20/2013 13:28   Ct Head Wo Contrast  08/20/2013   CLINICAL DATA:  Head injury  EXAM: CT HEAD WITHOUT CONTRAST  CT CERVICAL SPINE WITHOUT CONTRAST  TECHNIQUE: Multidetector CT imaging of the head and cervical spine was performed following the standard protocol without intravenous contrast. Multiplanar CT image reconstructions of the cervical spine were also generated.  COMPARISON:  04/11/2012  FINDINGS: CT HEAD FINDINGS  No skull fracture is noted. Atherosclerotic calcifications of carotid siphon. Mild mucosal thickening bilateral ethmoid air cells. The mastoid air cells are unremarkable.  No intracranial hemorrhage, mass effect or midline shift. Stable atrophy and chronic white matter disease. There is interval old appearing infarct in right occipital lobe axial images 14 and 15. This is of indeterminate age. Clinical correlation is necessary. If recent ischemia suspected further correlation with brain MRI is recommended. Stable  atrophy and chronic white matter disease.  CT CERVICAL SPINE FINDINGS  Axial images of the cervical spine shows no acute fracture or subluxation. Computer processed images shows no acute fracture. Again noted disc space flattening at C6-C7 with mild anterior and mild posterior spurring. Stable minimal anterolisthesis about 2 mm C7 on T1 vertebral body. Again noted degenerative changes C1-C2 articulation with pannus formation. Again noted calcified disc protrusion at C3-C4 and C4-C5 level. Mild disc protrusion at C5-C6 level. No prevertebral soft tissue swelling. Diffuse osteopenia. Cervical airway is patent.  IMPRESSION: 1. No acute intracranial abnormality. 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 Clinical correlation is necessary. She who 2. No cervical spine acute fracture or subluxation. Stable degenerative changes as described above.   Electronically Signed   By: Lahoma Crocker M.D.   On: 08/20/2013 13:55   Ct Cervical Spine Wo Contrast  08/20/2013   CLINICAL DATA:  Head injury  EXAM: CT HEAD WITHOUT CONTRAST  CT CERVICAL SPINE WITHOUT CONTRAST  TECHNIQUE: Multidetector CT imaging of the head and cervical spine was performed following the standard protocol without intravenous contrast. Multiplanar CT image reconstructions of the cervical spine were also generated.  COMPARISON:  04/11/2012  FINDINGS: CT HEAD FINDINGS  No skull fracture is noted. Atherosclerotic calcifications of carotid siphon. Mild mucosal thickening bilateral ethmoid air cells. The mastoid air cells are unremarkable.  No intracranial hemorrhage, mass effect or midline  shift. Stable atrophy and chronic white matter disease. There is interval old appearing infarct in right occipital lobe axial images 14 and 15. This is of indeterminate age. Clinical correlation is necessary. If recent ischemia suspected further correlation with brain MRI is recommended. Stable atrophy and chronic white matter disease.  CT CERVICAL SPINE  FINDINGS  Axial images of the cervical spine shows no acute fracture or subluxation. Computer processed images shows no acute fracture. Again noted disc space flattening at C6-C7 with mild anterior and mild posterior spurring. Stable minimal anterolisthesis about 2 mm C7 on T1 vertebral body. Again noted degenerative changes C1-C2 articulation with pannus formation. Again noted calcified disc protrusion at C3-C4 and C4-C5 level. Mild disc protrusion at C5-C6 level. No prevertebral soft tissue swelling. Diffuse osteopenia. Cervical airway is patent.  IMPRESSION: 1. No acute intracranial abnormality. 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 Clinical correlation is necessary. She who 2. No cervical spine acute fracture or subluxation. Stable degenerative changes as described above.   Electronically Signed   By: Lahoma Crocker M.D.   On: 08/20/2013 13:55   Ct Hip Right Wo Contrast  08/20/2013   CLINICAL DATA:  Fall.  Right hip pain.  EXAM: CT OF THE RIGHT HIP WITHOUT CONTRAST  TECHNIQUE: Multidetector CT imaging was performed according to the standard protocol. Multiplanar CT image reconstructions were also generated.  COMPARISON:  Plain films right hip earlier this same day.  FINDINGS: Right hip screw and long intra medullary nail for fixation of a remote healed subtrochanteric fracture identified. The right hip is located. No hip fracture is seen. The patient has an acute right inferior pubic ramus fracture. Buckling of anterior cortex of the high right superior pubic ramus is consistent with nondisplaced fracture. The fracture is not visible on the comparison plain films. No other acute fracture is identified. There appears to be some subcutaneous hematoma over the right hip. Degenerative change lower lumbar spine is noted. Intrapelvic contents demonstrate atherosclerosis but no acute abnormality. The patient is status post hysterectomy.  IMPRESSION: Acute right inferior and high superior pubic  rami fractures.  Negative for hip fracture with fixation hardware in place for remote healed fracture.   Electronically Signed   By: Inge Rise M.D.   On: 08/20/2013 16:57   Dg Humerus Right  08/20/2013   CLINICAL DATA:  Status post fall with pain in the right shoulder and proximal humerus  EXAM: RIGHT HUMERUS - 2+ VIEW  COMPARISON:  Right shoulder series of August 20, 2013.  FINDINGS: The patient has undergone previous placement of a prosthetic humeral head. The prosthesis and the native bone are intact. There are stable degenerative changes of the [facility] joint and of the glenoid. The humeral shaft and distal humerus are intact where visualized.  IMPRESSION: There is no acute bony abnormality of the right humerus.   Electronically Signed   By: David  [place]   On: 08/20/2013 20:29    Micro Results      Recent Results (from the past 240 hour(s))  URINE CULTURE     Status: None   Collection Time    08/20/13 10:20 PM      Result Value Ref Range Status   Specimen Description URINE, CLEAN CATCH   Final   Special Requests NONE   Final   Culture  Setup Time     Final   Value: 08/20/2013 23:04     Performed at [facility]  Final   Value: >=100,000 COLONIES/ML     Performed at [facility]   Culture     Final   Value: ESCHERICHIA COLI     Performed at [facility]   Report Status 08/24/2013 FINAL   Final   Organism ID, Bacteria ESCHERICHIA COLI   Final     Today   Subjective:   [name] today has no headache,no chest abdominal pain,no new weakness tingling or numbness, feels much better wants to go home today.    Objective:   Blood pressure 150/65, pulse 89, temperature 98 F (36.7 C), temperature source Oral, resp. rate 18, SpO2 97.00%.   Intake/Output Summary (Last 24 hours) at 08/25/13 0904 Last data filed at 08/24/13 2009  Gross per 24 hour  Intake    360 ml  Output      0 ml  Net    360 ml    Exam Awake Alert, Oriented x 3,  No new F.N deficits, Normal affect [place].AT,PERRAL Supple Neck,No JVD, No cervical lymphadenopathy appriciated.  Symmetrical Chest wall movement, Good air movement bilaterally, CTAB RRR,No Gallops,Rubs or new Murmurs, No Parasternal Heave +ve B.Sounds, Abd Soft, Non tender, No organomegaly appriciated, No rebound -guarding or rigidity. No Cyanosis, Clubbing or edema, No new Rash or bruise  Data Review   CBC w Diff: Lab Results  Component Value Date   WBC 7.9 08/21/2013   HGB 9.7* 08/21/2013   HCT 29.8* 08/21/2013   PLT 151 08/21/2013   LYMPHOPCT 14 04/11/2012   MONOPCT 8 04/11/2012   EOSPCT 2 04/11/2012   BASOPCT 0 04/11/2012    CMP: Lab Results  Component Value Date   NA 133* 08/21/2013   K 4.9 08/22/2013   CL 96 08/21/2013   CO2 23 08/21/2013   BUN 7 08/21/2013   CREATININE 0.52 08/21/2013   PROT 5.9* 08/21/2013   ALBUMIN 2.8* 08/21/2013   BILITOT 0.6 08/21/2013   ALKPHOS 76 08/21/2013   AST 30 08/21/2013   ALT 23 08/21/2013  . Lab Results  Component Value Date   INR 2.97* 08/25/2013   INR 4.40* 08/24/2013   INR 4.15* 08/23/2013      Total Time in preparing paper work, data evaluation and todays exam - 35 minutes  Thurnell Lose M.D on 08/25/2013 at 9:04 AM  [facility] Office  [phone]   **Disclaimer: This note may have been dictated with voice recognition software. Similar sounding words can inadvertently be transcribed and this note may contain transcription errors which may not have been corrected upon publication of note.**

## 2013-08-23 NOTE — Progress Notes (Signed)
Pt's neck pain/ache is positional -  hurts when turns to left.  No more shortness of breath.  Pain med and muscle relaxer given earlier.  No more co/ pain at 2000.

## 2013-08-23 NOTE — Care Management Note (Signed)
:    08/23/13 1:45pm Vance Peper, RN BSN CM Case manager spoke with patient this am concerning discharge plans. She will be going home with her sister-lois Dotson- [address] [place], [place] [address] [phone]. Case manager notified [facility] liason of change.

## 2013-08-24 LAB — GLUCOSE, CAPILLARY
GLUCOSE-CAPILLARY: 149 mg/dL — AB (ref 70–99)
GLUCOSE-CAPILLARY: 156 mg/dL — AB (ref 70–99)
Glucose-Capillary: 123 mg/dL — ABNORMAL HIGH (ref 70–99)
Glucose-Capillary: 94 mg/dL (ref 70–99)

## 2013-08-24 LAB — URINE CULTURE

## 2013-08-24 LAB — PROTIME-INR
INR: 4.4 — ABNORMAL HIGH (ref 0.00–1.49)
Prothrombin Time: 42 seconds — ABNORMAL HIGH (ref 11.6–15.2)

## 2013-08-24 MED ORDER — PHYTONADIONE 1 MG/0.5 ML ORAL SOLUTION
0.5000 mg | Freq: Once | ORAL | Status: AC
  Administered 2013-08-24: 0.5 mg via ORAL
  Filled 2013-08-24: qty 0.5

## 2013-08-24 NOTE — Progress Notes (Signed)
ANTICOAGULATION CONSULT NOTE - Follow Up Consult  Pharmacy Consult for Coumadin Indication: DVT (history)  Allergies  Allergen Reactions  . Codeine Nausea And Vomiting, Swelling and Rash  . Hydrocodone Nausea And Vomiting, Swelling and Rash    "mouth peeled"  . Morphine And Related Other (See Comments)    hallucinations  . Other Nausea And Vomiting    Most pain meds other than tramadol. Tylenol, Toradol, and Robaxin are ok.    . Penicillins Hives  . Darvocet [Propoxyphene N-Acetaminophen] Hives and Nausea And Vomiting  . Demerol [Meperidine] Nausea And Vomiting  . Statins Other (See Comments)    INTOLERANT OF STATINS - messes up her legs and has muscle aches  . Tape Other (See Comments)    Needs paper tape - skin very fragile    Patient Measurements:    Vital Signs: Temp: 99.5 F (37.5 C) (07/05 0529) Temp src: Oral (07/05 0529) BP: 137/50 mmHg (07/05 0529) Pulse Rate: 88 (07/05 0529)  Labs:  Recent Labs  08/22/13 0551 08/23/13 0450 08/24/13 0420  LABPROT 30.9* 40.1* 42.0*  INR 2.97* 4.15* 4.40*    The CrCl is unknown because both a height and weight (above a minimum accepted value) are required for this calculation.   Medications:  Scheduled:  . amLODipine  5 mg Oral Daily  . bisacodyl  10 mg Oral Daily  . bisacodyl  10 mg Rectal Once  . cholecalciferol  2,000 Units Oral Daily  . docusate sodium  100 mg Oral QPM  . escitalopram  5 mg Oral QPM  . hydroxychloroquine  200 mg Oral BID  . insulin aspart  0-5 Units Subcutaneous QHS  . insulin aspart  0-9 Units Subcutaneous TID WC  . isosorbide mononitrate  30 mg Oral Daily  . Linaclotide  145 mcg Oral Daily  . pantoprazole  40 mg Oral Daily  . polyethylene glycol  17 g Oral BID  . sulfaSALAzine  500 mg Oral TID  . Warfarin - Pharmacist Dosing Inpatient   Does not apply q1800    Assessment: 80 yo who presented after a fall that resulted in rib and rami fractures. She has been on coumadin for history of  DVT. Her INR on admission was 1.95.  INR has steadily increased despite decreased Coumadin doses.  INR today is 4.4.  Home dose is currently 3mg  daily except 4.5mg  on Mon/Weds. Per granddaughter, Tammy, pt has INR checked every 2 weeks and dose is adjusted between 4.5mg  2-3 days/week depending on INR. For the last year, INR has been mostly between 1.9-3.2 on outpt checks.  Goal of Therapy:  INR 2-3 Monitor platelets by anticoagulation protocol: Yes   Plan:  No Coumadin today. Continue daily INR. Noted plans for possible discharge today.  I would recommend holding Coumadin and arrange for repeat INR check on Tuesday.  Then the Coumadin clinic can instruct on dose adjustment if needed.  Thursday, Pharm.D., BCPS Clinical Pharmacist Pager [phone] 08/24/2013 8:31 AM

## 2013-08-24 NOTE — Progress Notes (Signed)
Patient Demographics  [name], is a 80 y.o. female, DOB - 1930/07/26, ZOX:[id number]  Admit date - 08/20/2013   Admitting Physician Therisa Doyne, MD  Outpatient Primary MD for the patient is Hoyle Sauer, MD  LOS - 4   Chief Complaint  Patient presents with  . Fall           Subjective:   [name] today has, No headache, No abdominal pain - No Nausea, No new weakness tingling or numbness, No Cough - SOB. Some right-sided pelvic pain and rib cage pain.    Assessment & Plan    1. Mechanical fall with Multiple right-sided rib fracture and pelvic fracture. For now supportive care with pain control, PT to evaluate, advance activity, may require placement which is refusing, discussed with granddaughter was eager for placement as well. Discussed with Dr. Magnus Ivan no surgical intervention. Patient refused placement home health PT and equipment were ordered and arranged.   Patient was discharged on 08/23/2013, however family refusing to take the patient as her INR is 4.4. Note patient is chronically on Coumadin patient and family were explained INR of 4.4 is normal life threatening and can be easily monitored in the outpatient setting by PCP.     2. CAD (coronary artery disease), native coronary artery - PCI to RCA & Circumflex 2004 - currently stable continue home medications which includes indoor along with Coumadin.     3.History of DVT. On Coumadin, INR high, will give her 0.5 mg of oral vitamin K as family uncomfortable with high INR levels as dictated above. Will defer long-term use of Coumadin to PCP in the light of patient's poor balance and falls.    4. COPD - chronic with no acute issues, no wheezing on exam as needed oxygen and nebulizer treatments.     5. DM  (diabetes mellitus), type 2 - not on any medications, check A1c if needed sliding scale.    Lab Results  Component Value Date   HGBA1C 6.1* 08/21/2013    CBG (last 3)   Recent Labs  08/23/13 1642 08/23/13 2235 08/24/13 0642  GLUCAP 144* 154* 123*      6.HTN (hypertension) continue home medications - currently on Norvasc and Imdur which will be continued.    7. GERD. On PPI.    8.Leukocytosis due to UTI. Treated with Rocephin. Follow final cultures.      Code Status: Full  Family Communication: Granddaughter over the phone who requests possible SNF placement  Disposition Plan: To be decided but likely SNF placement if patient agrees   Procedures CT head, CT C-spine, CT pelvis and hip. Chest x-ray and rib series x-ray   Consults Dr Magnus Ivan [facility]   Medications  Scheduled Meds: . amLODipine  5 mg Oral Daily  . bisacodyl  10 mg Oral Daily  . bisacodyl  10 mg Rectal Once  . cholecalciferol  2,000 Units Oral Daily  . docusate sodium  100 mg Oral QPM  . escitalopram  5 mg Oral QPM  . hydroxychloroquine  200 mg Oral BID  . insulin aspart  0-5 Units Subcutaneous QHS  . insulin aspart  0-9 Units Subcutaneous TID WC  . isosorbide mononitrate  30 mg Oral Daily  . Linaclotide  145 mcg Oral Daily  . pantoprazole  40 mg Oral Daily  . polyethylene glycol  17 g Oral BID  . sulfaSALAzine  500 mg Oral TID  . Warfarin - Pharmacist Dosing Inpatient   Does not apply q1800   Continuous Infusions:  PRN Meds:.HYDROmorphone (DILAUDID) injection, methocarbamol, neomycin-bacitracin-polymyxin, ondansetron (ZOFRAN) IV, ondansetron, traMADol  DVT Prophylaxis Coumadin  Lab Results  Component Value Date   INR 4.40* 08/24/2013   INR 4.15* 08/23/2013   INR 2.97* 08/22/2013     Lab Results  Component Value Date   PLT 151 08/21/2013    Antibiotics     Anti-infectives   Start     Dose/Rate Route Frequency Ordered Stop   08/21/13 1130  cefTRIAXone (ROCEPHIN) 1 g in dextrose 5 %  50 mL IVPB     1 g 100 mL/hr over 30 Minutes Intravenous Every 24 hours 08/21/13 1007 08/23/13 1259   08/20/13 2200  hydroxychloroquine (PLAQUENIL) tablet 200 mg     200 mg Oral 2 times daily 08/20/13 2045            Objective:   Filed Vitals:   08/23/13 0440 08/23/13 1601 08/23/13 2105 08/24/13 0529  BP: 182/62 138/47 126/79 137/50  Pulse: 89 86 89 88  Temp: 98 F (36.7 C) 98.2 F (36.8 C) 98.7 F (37.1 C) 99.5 F (37.5 C)  TempSrc: Oral Oral Oral Oral  Resp: 18 18 18 18   SpO2: 97% 98% 91% 90%    Wt Readings from Last 3 Encounters:  12/27/12 52.617 kg (116 lb)  12/27/12 52.617 kg (116 lb)  12/24/12 52.617 kg (116 lb)     Intake/Output Summary (Last 24 hours) at 08/24/13 1010 Last data filed at 08/24/13 0800  Gross per 24 hour  Intake    600 ml  Output      0 ml  Net    600 ml     Physical Exam  Awake Alert, Oriented X 3, No new F.N deficits, Normal affect [place].AT,PERRAL Supple Neck,No JVD, No cervical lymphadenopathy appriciated.  Symmetrical Chest wall movement, Good air movement bilaterally, CTAB RRR,No Gallops,Rubs or new Murmurs, No Parasternal Heave +ve B.Sounds, Abd Soft, No tenderness, No organomegaly appriciated, No rebound - guarding or rigidity. No Cyanosis, Clubbing or edema, No new Rash or bruise      Data Review   Micro Results Recent Results (from the past 240 hour(s))  URINE CULTURE     Status: None   Collection Time    08/20/13 10:20 PM      Result Value Ref Range Status   Specimen Description URINE, CLEAN CATCH   Final   Special Requests NONE   Final   Culture  Setup Time     Final   Value: 08/20/2013 23:04     Performed at [facility]   [place] Count     Final   Value: >=100,000 COLONIES/ML     Performed at [facility]   Culture     Final   Value: GRAM NEGATIVE RODS     Performed at [facility]   Report Status PENDING   Incomplete    Radiology Reports Dg Ribs Unilateral W/chest Right  08/20/2013    CLINICAL DATA:  Pain post trauma  EXAM: RIGHT RIBS AND CHEST - 3+ VIEW  COMPARISON:  April 12, 2012  FINDINGS: Frontal chest as well as bilateral oblique and cone-down lower rib images were obtained. There is no edema or consolidation. Heart size and pulmonary  vascularity are normal. Aorta is tortuous with atherosclerotic change in the aorta. No adenopathy. Patient is status post right total shoulder replacement. There is a small hiatal hernia.  There are old healed fractures of the left lateral eighth and ninth ribs. There is no apparent pneumothorax or effusion. There is a nondisplaced fracture of the antral lateral right fourth rib. There is a slightly displaced fracture of the anterior right sixth rib.  IMPRESSION: Acute rib fractures on the right without pneumothorax or effusion. Old healed rib fractures on the left. Small hiatal hernia present.   Electronically Signed   By: Bretta Bang M.D.   On: 08/20/2013 13:32   Dg Shoulder Right  08/20/2013   CLINICAL DATA:  Fall.  Right shoulder pain  EXAM: RIGHT SHOULDER - 2+ VIEW  COMPARISON:  11/10/2009  FINDINGS: Previous right shoulder arthroplasty. The right shoulder is located. There is no fracture or dislocation identified. Postsurgical changes involve the distal aspect of the clavicle.  IMPRESSION: 1. No acute findings. 2. Prior right shoulder arthroplasty.   Electronically Signed   By: Signa Kell M.D.   On: 08/20/2013 20:31   Dg Hip Complete Right  08/20/2013   CLINICAL DATA:  Larey Seat.  Right hip pain.  EXAM: RIGHT HIP - COMPLETE 2+ VIEW  COMPARISON:  04/11/2012  FINDINGS: The left hip prosthesis is intact. The right hip hardware is intact. There is a gamma nail and dynamic hip screw transfixing the remote intertrochanteric fracture. No acute bony findings. The bony pelvis appears intact. No definite pelvic fractures. Extensive vascular calcifications are again noted.  IMPRESSION: No acute bony findings.   Electronically Signed   By: Loralie Champagne M.D.   On: 08/20/2013 13:29   Dg Femur Right  08/20/2013   CLINICAL DATA:  Right hip  EXAM: RIGHT FEMUR - 2 VIEW  COMPARISON:  None.  FINDINGS: No acute fracture or dislocation. Moderate osteoarthritis of the right hip. Old healed right intertrochanteric fracture transfixed by an intra medullary nail and interlocking femoral neck screw. No lytic or sclerotic osseous lesion. Peripheral vascular atherosclerotic disease.  IMPRESSION: No acute osseous injury of the right femur.   Electronically Signed   By: Elige Ko   On: 08/20/2013 13:28   Ct Head Wo Contrast  08/20/2013   CLINICAL DATA:  Head injury  EXAM: CT HEAD WITHOUT CONTRAST  CT CERVICAL SPINE WITHOUT CONTRAST  TECHNIQUE: Multidetector CT imaging of the head and cervical spine was performed following the standard protocol without intravenous contrast. Multiplanar CT image reconstructions of the cervical spine were also generated.  COMPARISON:  04/11/2012  FINDINGS: CT HEAD FINDINGS  No skull fracture is noted. Atherosclerotic calcifications of carotid siphon. Mild mucosal thickening bilateral ethmoid air cells. The mastoid air cells are unremarkable.  No intracranial hemorrhage, mass effect or midline shift. Stable atrophy and chronic white matter disease. There is interval old appearing infarct in right occipital lobe axial images 14 and 15. This is of indeterminate age. Clinical correlation is necessary. If recent ischemia suspected further correlation with brain MRI is recommended. Stable atrophy and chronic white matter disease.  CT CERVICAL SPINE FINDINGS  Axial images of the cervical spine shows no acute fracture or subluxation. Computer processed images shows no acute fracture. Again noted disc space flattening at C6-C7 with mild anterior and mild posterior spurring. Stable minimal anterolisthesis about 2 mm C7 on T1 vertebral body. Again noted degenerative changes C1-C2 articulation with pannus formation. Again noted calcified disc  protrusion at C3-C4 and  C4-C5 level. Mild disc protrusion at C5-C6 level. No prevertebral soft tissue swelling. Diffuse osteopenia. Cervical airway is patent.  IMPRESSION: 1. No acute intracranial abnormality. 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 Clinical correlation is necessary. She who 2. No cervical spine acute fracture or subluxation. Stable degenerative changes as described above.   Electronically Signed   By: Natasha Mead M.D.   On: 08/20/2013 13:55   Ct Cervical Spine Wo Contrast  08/20/2013   CLINICAL DATA:  Head injury  EXAM: CT HEAD WITHOUT CONTRAST  CT CERVICAL SPINE WITHOUT CONTRAST  TECHNIQUE: Multidetector CT imaging of the head and cervical spine was performed following the standard protocol without intravenous contrast. Multiplanar CT image reconstructions of the cervical spine were also generated.  COMPARISON:  04/11/2012  FINDINGS: CT HEAD FINDINGS  No skull fracture is noted. Atherosclerotic calcifications of carotid siphon. Mild mucosal thickening bilateral ethmoid air cells. The mastoid air cells are unremarkable.  No intracranial hemorrhage, mass effect or midline shift. Stable atrophy and chronic white matter disease. There is interval old appearing infarct in right occipital lobe axial images 14 and 15. This is of indeterminate age. Clinical correlation is necessary. If recent ischemia suspected further correlation with brain MRI is recommended. Stable atrophy and chronic white matter disease.  CT CERVICAL SPINE FINDINGS  Axial images of the cervical spine shows no acute fracture or subluxation. Computer processed images shows no acute fracture. Again noted disc space flattening at C6-C7 with mild anterior and mild posterior spurring. Stable minimal anterolisthesis about 2 mm C7 on T1 vertebral body. Again noted degenerative changes C1-C2 articulation with pannus formation. Again noted calcified disc protrusion at C3-C4 and C4-C5 level. Mild disc protrusion at  C5-C6 level. No prevertebral soft tissue swelling. Diffuse osteopenia. Cervical airway is patent.  IMPRESSION: 1. No acute intracranial abnormality. 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 Clinical correlation is necessary. She who 2. No cervical spine acute fracture or subluxation. Stable degenerative changes as described above.   Electronically Signed   By: Natasha Mead M.D.   On: 08/20/2013 13:55   Ct Hip Right Wo Contrast  08/20/2013   CLINICAL DATA:  Fall.  Right hip pain.  EXAM: CT OF THE RIGHT HIP WITHOUT CONTRAST  TECHNIQUE: Multidetector CT imaging was performed according to the standard protocol. Multiplanar CT image reconstructions were also generated.  COMPARISON:  Plain films right hip earlier this same day.  FINDINGS: Right hip screw and long intra medullary nail for fixation of a remote healed subtrochanteric fracture identified. The right hip is located. No hip fracture is seen. The patient has an acute right inferior pubic ramus fracture. Buckling of anterior cortex of the high right superior pubic ramus is consistent with nondisplaced fracture. The fracture is not visible on the comparison plain films. No other acute fracture is identified. There appears to be some subcutaneous hematoma over the right hip. Degenerative change lower lumbar spine is noted. Intrapelvic contents demonstrate atherosclerosis but no acute abnormality. The patient is status post hysterectomy.  IMPRESSION: Acute right inferior and high superior pubic rami fractures.  Negative for hip fracture with fixation hardware in place for remote healed fracture.   Electronically Signed   By: Drusilla Kanner M.D.   On: 08/20/2013 16:57   Dg Humerus Right  08/20/2013   CLINICAL DATA:  Status post fall with pain in the right shoulder and proximal humerus  EXAM: RIGHT HUMERUS - 2+ VIEW  COMPARISON:  Right shoulder  series of August 20, 2013.  FINDINGS: The patient has undergone previous placement of a prosthetic  humeral head. The prosthesis and the native bone are intact. There are stable degenerative changes of the [facility] joint and of the glenoid. The humeral shaft and distal humerus are intact where visualized.  IMPRESSION: There is no acute bony abnormality of the right humerus.   Electronically Signed   By: David  [place]   On: 08/20/2013 20:29    CBC  Recent Labs Lab 08/20/13 1211 08/21/13 0356  WBC 12.9* 7.9  HGB 10.9* 9.7*  HCT 33.3* 29.8*  PLT 163 151  MCV 90.7 88.4  MCH 29.7 28.8  MCHC 32.7 32.6  RDW 13.8 13.9    Chemistries   Recent Labs Lab 08/20/13 1211 08/21/13 0356 08/22/13 0551  NA 136* 133*  --   K 3.8 3.6* 4.9  CL 99 96  --   CO2 24 23  --   GLUCOSE 146* 100*  --   BUN 7 7  --   CREATININE 0.51 0.52  --   CALCIUM 9.0 8.7  --   MG  --  1.7  --   AST  --  30  --   ALT  --  23  --   ALKPHOS  --  76  --   BILITOT  --  0.6  --    ------------------------------------------------------------------------------------------------------------------ CrCl is unknown because both a height and weight (above a minimum accepted value) are required for this calculation. ------------------------------------------------------------------------------------------------------------------  Recent Labs  08/21/13 1130  HGBA1C 6.1*   ------------------------------------------------------------------------------------------------------------------ No results found for this basename: CHOL, HDL, LDLCALC, TRIG, CHOLHDL, LDLDIRECT,  in the last 72 hours ------------------------------------------------------------------------------------------------------------------ No results found for this basename: TSH, T4TOTAL, FREET3, T3FREE, THYROIDAB,  in the last 72 hours ------------------------------------------------------------------------------------------------------------------ No results found for this basename: VITAMINB12, FOLATE, FERRITIN, TIBC, IRON, RETICCTPCT,  in the last 72  hours  Coagulation profile  Recent Labs Lab 08/20/13 1211 08/21/13 0356 08/22/13 0551 08/23/13 0450 08/24/13 0420  INR 1.95* 2.42* 2.97* 4.15* 4.40*    No results found for this basename: DDIMER,  in the last 72 hours  Cardiac Enzymes No results found for this basename: CK, CKMB, TROPONINI, MYOGLOBIN,  in the last 168 hours ------------------------------------------------------------------------------------------------------------------ No components found with this basename: POCBNP,      Time Spent in minutes  35   Bevan Disney K M.D on 08/24/2013 at 10:10 AM  Between 7am to 7pm - Pager - [phone]  After 7pm go to [email or web address] - password [email or web address]  And look for the night coverage person covering for me after hours  [facility] Office  [phone]   **Disclaimer: This note may have been dictated with voice recognition software. Similar sounding words can inadvertently be transcribed and this note may contain transcription errors which may not have been corrected upon publication of note.**

## 2013-08-24 NOTE — Progress Notes (Signed)
Physical Therapy Treatment Patient Details Name: [name] MRN: [id number] DOB: [birth year] Today's Date: 08/24/2013    History of Present Illness pt presents post fall resulting in R superior and inferior pubic rami fxs, R UE abrasion, and R shoulder pain.      PT Comments    Pt is not safe to return home or D/C to sister's home, however she continue's to be adamant about D/C to sister's home.  Attempted to have pt try single step and pt unable to complete.  Needing to sit after only amb 3' up to step.  Pt needs 24hr care and if she is to D/C to home she will need a W/C with cushion and elevating leg rests.  To get to sister's home she will either need and ambulance transport or family will have to carry her up the step into her sister's home.  Discussed situation with RN.  Continue to feel pt is most appropriate for SNF.  Will continue to follow.    Follow Up Recommendations  SNF     Equipment Recommendations  3in1 (PT);Wheelchair (measurements PT);Wheelchair cushion (measurements PT)    Recommendations for Other Services       Precautions / Restrictions Precautions Precautions: Fall Restrictions Weight Bearing Restrictions: Yes RLE Weight Bearing: Weight bearing as tolerated    Mobility  Bed Mobility Overal bed mobility: Needs Assistance Bed Mobility: Supine to Sit     Supine to sit: Min assist     General bed mobility comments: pt only requiring A with bringing trunk up to sitting today.    Transfers Overall transfer level: Needs assistance Equipment used: Rolling walker (2 wheeled) Transfers: Sit to/from [employer] Sit to Stand: Min guard Stand pivot transfers: Min assist       General transfer comment: continues to need A with RW during pivot and cues for sequencing RW and gait.    Ambulation/Gait Ambulation/Gait assistance: Min assist Ambulation Distance (Feet): 3 Feet (x2) Assistive device: Rolling walker (2 wheeled) Gait  Pattern/deviations: Step-to pattern;Decreased step length - left;Decreased stance time - right;Decreased stride length;Trunk flexed     General Gait Details: Max encouragement for amb.  cues for sequencing RW and gait.     Stairs Stairs: Yes   Stair Management: No rails;Backwards;With walker Number of Stairs: 1 General stair comments: Attempted to have pt try single step as she would to enter her sister's home.  pt needs increased time and step-by-step cueing simply to try to turn towards step and seemed to have difficulty following directions with this.  Before even attempting to step up on step, pt states she is too tired and needed to sit down.  Unable to do a single step today.    Wheelchair Mobility    Modified Rankin (Stroke Patients Only)       Balance Overall balance assessment: Needs assistance Sitting-balance support: Single extremity supported;Feet supported Sitting balance-Leahy Scale: Poor     Standing balance support: Bilateral upper extremity supported Standing balance-Leahy Scale: Poor                      Cognition Arousal/Alertness: Awake/alert Behavior During Therapy: WFL for tasks assessed/performed Overall Cognitive Status: Within Functional Limits for tasks assessed                      Exercises      General Comments        Pertinent Vitals/Pain R ribs, shoulder and hip.  Premedicated.      Home Living                      Prior Function            PT Goals (current goals can now be found in the care plan section) Acute Rehab PT Goals Time For Goal Achievement: 09/04/13 Potential to Achieve Goals: Good Progress towards PT goals: Progressing toward goals    Frequency  Min 3X/week    PT Plan Current plan remains appropriate    Co-evaluation             End of Session Equipment Utilized During Treatment: Gait belt Activity Tolerance: Patient limited by pain Patient left: in chair;with call  bell/phone within reach     Time: 6203-5597 PT Time Calculation (min): 32 min  Charges:  $Gait Training: 8-22 mins $Therapeutic Activity: 8-22 mins                    G CodesSunny [name], [name] [phone] 08/24/2013, 10:39 AM

## 2013-08-25 LAB — GLUCOSE, CAPILLARY
GLUCOSE-CAPILLARY: 106 mg/dL — AB (ref 70–99)
Glucose-Capillary: 108 mg/dL — ABNORMAL HIGH (ref 70–99)
Glucose-Capillary: 99 mg/dL (ref 70–99)

## 2013-08-25 LAB — PROTIME-INR
INR: 2.97 — ABNORMAL HIGH (ref 0.00–1.49)
Prothrombin Time: 30.9 seconds — ABNORMAL HIGH (ref 11.6–15.2)

## 2013-08-25 MED ORDER — WARFARIN SODIUM 1 MG PO TABS
1.0000 mg | ORAL_TABLET | Freq: Once | ORAL | Status: AC
  Administered 2013-08-25: 1 mg via ORAL
  Filled 2013-08-25: qty 1

## 2013-08-25 MED ORDER — WARFARIN SODIUM 3 MG PO TABS: 3.0000 mg | ORAL_TABLET | ORAL | Status: DC

## 2013-08-25 MED ORDER — WHITE PETROLATUM GEL
Status: AC
Start: 2013-08-25 — End: 2013-08-25
  Administered 2013-08-25: 12:00:00
  Filled 2013-08-25: qty 5

## 2013-08-25 NOTE — Progress Notes (Signed)
CSW visited pt room and confirmed pt dc address with pt sister as pt was on the phone. CSW also received consent to contact pt HCPOA Tammy informing her of dc plans. Pt appreciative of assistance. CSW to contact [facility] once medical bed has been delivered to pt home.  Theresia Bough, MSW, LCSW [phone]

## 2013-08-25 NOTE — Discharge Instructions (Signed)
Follow with Primary MD Hoyle Sauer, MD in 1 days   Get CBC, CMP, INR,   checked  by Primary MD next visit.    Activity: As tolerated with Full fall precautions use walker/cane & assistance as needed   Disposition Home with HHPT, refused SNF   Diet: Heart Healthy - Low carb  Accuchecks 4 times/day, Once in AM empty stomach and then before each meal. Log in all results and show them to your Prim.MD in 3 days. If any glucose reading is under 80 or above 300 call your Prim MD immidiately. Follow Low glucose instructions for glucose under 80 as instructed.    For Heart failure patients - Check your Weight same time everyday, if you gain over 2 pounds, or you develop in leg swelling, experience more shortness of breath or chest pain, call your Primary MD immediately. Follow Cardiac Low Salt Diet and 1.8 lit/day fluid restriction.   On your next visit with her primary care physician please Get Medicines reviewed and adjusted.  Please request your Prim.MD to go over all Hospital Tests and Procedure/Radiological results at the follow up, please get all Hospital records sent to your Prim MD by signing hospital release before you go home.   If you experience worsening of your admission symptoms, develop shortness of breath, life threatening emergency, suicidal or homicidal thoughts you must seek medical attention immediately by calling 911 or calling your MD immediately  if symptoms less severe.  You Must read complete instructions/literature along with all the possible adverse reactions/side effects for all the Medicines you take and that have been prescribed to you. Take any new Medicines after you have completely understood and accpet all the possible adverse reactions/side effects.   Do not drive, operating heavy machinery, perform activities at heights, swimming or participation in water activities or provide baby sitting services if your were admitted for syncope or siezures until  you have seen by Primary MD or a Neurologist and advised to do so again.  Do not drive when taking Pain medications.    Do not take more than prescribed Pain, Sleep and Anxiety Medications  Special Instructions: If you have smoked or chewed Tobacco  in the last 2 yrs please stop smoking, stop any regular Alcohol  and or any Recreational drug use.  Wear Seat belts while driving.   Please note  You were cared for by a hospitalist during your hospital stay. If you have any questions about your discharge medications or the care you received while you were in the hospital after you are discharged, you can call the unit and asked to speak with the hospitalist on call if the hospitalist that took care of you is not available. Once you are discharged, your primary care physician will handle any further medical issues. Please note that NO REFILLS for any discharge medications will be authorized once you are discharged, as it is imperative that you return to your primary care physician (or establish a relationship with a primary care physician if you do not have one) for your aftercare needs so that they can reassess your need for medications and monitor your lab values.

## 2013-08-25 NOTE — Progress Notes (Signed)
Patient discharged to sister's home via [facility].  Dressing to skin tear on RUA changed and neosporin applied.  Patient presented with mild SOB upon exertion to [facility] and then transfer to stretcher.  O2 sat 95-98% on room air.  Incentive spirometer given for home use.  Discharge instructions and Rx given to EMS transporter.

## 2013-08-25 NOTE — Progress Notes (Signed)
Patient discharged to home. Discharge instructions and rx explained to family taking care of patient. IV was removed. Non-emergency transportation was called to transport patient home.

## 2013-08-25 NOTE — Progress Notes (Signed)
ANTICOAGULATION CONSULT NOTE - Follow Up Consult  Pharmacy Consult for Coumadin Indication: DVT (history)  Allergies  Allergen Reactions  . Codeine Nausea And Vomiting, Swelling and Rash  . Hydrocodone Nausea And Vomiting, Swelling and Rash    "mouth peeled"  . Morphine And Related Other (See Comments)    hallucinations  . Other Nausea And Vomiting    Most pain meds other than tramadol. Tylenol, Toradol, and Robaxin are ok.    . Penicillins Hives  . Darvocet [Propoxyphene N-Acetaminophen] Hives and Nausea And Vomiting  . Demerol [Meperidine] Nausea And Vomiting  . Statins Other (See Comments)    INTOLERANT OF STATINS - messes up her legs and has muscle aches  . Tape Other (See Comments)    Needs paper tape - skin very fragile    Patient Measurements:    Vital Signs: Temp: 98 F (36.7 C) (07/06 0546) Temp src: Oral (07/06 0546) BP: 146/56 mmHg (07/06 0546) Pulse Rate: 80 (07/06 0546)  Labs:  Recent Labs  08/23/13 0450 08/24/13 0420 08/25/13 0500  LABPROT 40.1* 42.0* 30.9*  INR 4.15* 4.40* 2.97*    The CrCl is unknown because both a height and weight (above a minimum accepted value) are required for this calculation.   Medications:  Scheduled:  . amLODipine  5 mg Oral Daily  . bisacodyl  10 mg Oral Daily  . bisacodyl  10 mg Rectal Once  . cholecalciferol  2,000 Units Oral Daily  . docusate sodium  100 mg Oral QPM  . escitalopram  5 mg Oral QPM  . hydroxychloroquine  200 mg Oral BID  . insulin aspart  0-5 Units Subcutaneous QHS  . insulin aspart  0-9 Units Subcutaneous TID WC  . isosorbide mononitrate  30 mg Oral Daily  . Linaclotide  145 mcg Oral Daily  . pantoprazole  40 mg Oral Daily  . polyethylene glycol  17 g Oral BID  . sulfaSALAzine  500 mg Oral TID  . Warfarin - Pharmacist Dosing Inpatient   Does not apply q1800    Assessment: 80 yo who presented after a fall that resulted in rib and rami fractures. She has been on coumadin for history of  DVT. Her INR on admission was 1.95.  INR has steadily increased despite decreased Coumadin doses.  INR today is 2.97 after Vit K 0.5 mg po given yesterday  Home dose is currently 3mg  daily except 4.5mg  on Mon/Weds. Per granddaughter, Tammy, pt has INR checked every 2 weeks and dose is adjusted between 4.5mg  2-3 days/week depending on INR. For the last year, INR has been mostly between 1.9-3.2 on outpt checks.  Goal of Therapy:  INR 2-3 Monitor platelets by anticoagulation protocol: Yes   Plan:  Coumadin 1 mg today Continue daily INR. Noted plans for possible discharge.  Will need close f/u INR if coumadin is continued.  I would recommend small dose at discharge 1-2 mg daily until INR can be checked.  , Pharm.D Clinical Pharmacist Pager [phone] 08/25/2013 12:33 PM

## 2013-08-27 NOTE — Progress Notes (Signed)
Chaplain Note: Patient requested a Chaplain for prayer. Upon arrival, patient informed Chaplain that she would be discharged within the next few hours. Patient seemed to be in high spirits because she was being released from the hospital, but she also seemed to not be satisfied that she could not return to her home. 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 Chaplain provided a ministry of presence, by listening and creating space for the patient to share her story, fears, and concerns.  Toni Amend, Chaplain

## 2013-10-23 ENCOUNTER — Other Ambulatory Visit: Payer: Self-pay | Admitting: Cardiology

## 2013-10-24 ENCOUNTER — Ambulatory Visit (INDEPENDENT_AMBULATORY_CARE_PROVIDER_SITE_OTHER): Payer: Medicare Other | Admitting: Pharmacist

## 2013-10-24 DIAGNOSIS — Z7901 Long term (current) use of anticoagulants: Secondary | ICD-10-CM

## 2013-10-24 DIAGNOSIS — I82519 Chronic embolism and thrombosis of unspecified femoral vein: Secondary | ICD-10-CM

## 2013-10-24 DIAGNOSIS — I825Y9 Chronic embolism and thrombosis of unspecified deep veins of unspecified proximal lower extremity: Secondary | ICD-10-CM

## 2013-10-24 LAB — POCT INR: INR: 1.6

## 2013-11-04 ENCOUNTER — Ambulatory Visit (INDEPENDENT_AMBULATORY_CARE_PROVIDER_SITE_OTHER): Payer: Medicare Other | Admitting: *Deleted

## 2013-11-04 DIAGNOSIS — Z7901 Long term (current) use of anticoagulants: Secondary | ICD-10-CM

## 2013-11-04 DIAGNOSIS — I82519 Chronic embolism and thrombosis of unspecified femoral vein: Secondary | ICD-10-CM

## 2013-11-04 DIAGNOSIS — I825Y9 Chronic embolism and thrombosis of unspecified deep veins of unspecified proximal lower extremity: Secondary | ICD-10-CM

## 2013-11-04 LAB — POCT INR: INR: 1.6

## 2013-11-14 ENCOUNTER — Ambulatory Visit (INDEPENDENT_AMBULATORY_CARE_PROVIDER_SITE_OTHER): Payer: Medicare Other | Admitting: *Deleted

## 2013-11-14 DIAGNOSIS — Z7901 Long term (current) use of anticoagulants: Secondary | ICD-10-CM

## 2013-11-14 DIAGNOSIS — I82519 Chronic embolism and thrombosis of unspecified femoral vein: Secondary | ICD-10-CM

## 2013-11-14 DIAGNOSIS — I825Y9 Chronic embolism and thrombosis of unspecified deep veins of unspecified proximal lower extremity: Secondary | ICD-10-CM

## 2013-11-14 LAB — POCT INR: INR: 1.9

## 2013-11-24 ENCOUNTER — Other Ambulatory Visit: Payer: Self-pay | Admitting: Cardiology

## 2013-11-24 NOTE — Telephone Encounter (Signed)
Rx was sent to pharmacy electronically. OV 10/27

## 2013-11-28 ENCOUNTER — Ambulatory Visit (INDEPENDENT_AMBULATORY_CARE_PROVIDER_SITE_OTHER): Payer: Medicare Other

## 2013-11-28 DIAGNOSIS — Z7901 Long term (current) use of anticoagulants: Secondary | ICD-10-CM

## 2013-11-28 DIAGNOSIS — I82519 Chronic embolism and thrombosis of unspecified femoral vein: Secondary | ICD-10-CM

## 2013-11-28 LAB — POCT INR: INR: 4.2

## 2013-12-01 ENCOUNTER — Emergency Department (HOSPITAL_COMMUNITY): Payer: Medicare Other

## 2013-12-01 ENCOUNTER — Encounter (HOSPITAL_COMMUNITY): Payer: Self-pay | Admitting: Emergency Medicine

## 2013-12-01 ENCOUNTER — Inpatient Hospital Stay (HOSPITAL_COMMUNITY)
Admission: EM | Admit: 2013-12-01 | Discharge: 2013-12-10 | DRG: 982 | Disposition: A | Discharge status: Home Health | Payer: Medicare Other | Attending: Internal Medicine | Admitting: Internal Medicine

## 2013-12-01 DIAGNOSIS — I82509 Chronic embolism and thrombosis of unspecified deep veins of unspecified lower extremity: Secondary | ICD-10-CM

## 2013-12-01 DIAGNOSIS — E119 Type 2 diabetes mellitus without complications: Secondary | ICD-10-CM | POA: Diagnosis present

## 2013-12-01 DIAGNOSIS — J449 Chronic obstructive pulmonary disease, unspecified: Secondary | ICD-10-CM | POA: Diagnosis present

## 2013-12-01 DIAGNOSIS — R1011 Right upper quadrant pain: Secondary | ICD-10-CM

## 2013-12-01 DIAGNOSIS — S3011XA Contusion of abdominal wall, initial encounter: Secondary | ICD-10-CM | POA: Diagnosis present

## 2013-12-01 DIAGNOSIS — S2231XD Fracture of one rib, right side, subsequent encounter for fracture with routine healing: Secondary | ICD-10-CM

## 2013-12-01 DIAGNOSIS — Z96642 Presence of left artificial hip joint: Secondary | ICD-10-CM | POA: Diagnosis present

## 2013-12-01 DIAGNOSIS — E118 Type 2 diabetes mellitus with unspecified complications: Secondary | ICD-10-CM

## 2013-12-01 DIAGNOSIS — M549 Dorsalgia, unspecified: Secondary | ICD-10-CM

## 2013-12-01 DIAGNOSIS — R109 Unspecified abdominal pain: Secondary | ICD-10-CM | POA: Diagnosis not present

## 2013-12-01 DIAGNOSIS — S301XXS Contusion of abdominal wall, sequela: Secondary | ICD-10-CM

## 2013-12-01 DIAGNOSIS — Z96611 Presence of right artificial shoulder joint: Secondary | ICD-10-CM | POA: Diagnosis present

## 2013-12-01 DIAGNOSIS — S22009A Unspecified fracture of unspecified thoracic vertebra, initial encounter for closed fracture: Secondary | ICD-10-CM

## 2013-12-01 DIAGNOSIS — I10 Essential (primary) hypertension: Secondary | ICD-10-CM | POA: Diagnosis present

## 2013-12-01 DIAGNOSIS — Z9889 Other specified postprocedural states: Secondary | ICD-10-CM

## 2013-12-01 DIAGNOSIS — S301XXA Contusion of abdominal wall, initial encounter: Secondary | ICD-10-CM | POA: Diagnosis present

## 2013-12-01 DIAGNOSIS — K5900 Constipation, unspecified: Secondary | ICD-10-CM | POA: Diagnosis present

## 2013-12-01 DIAGNOSIS — S22000A Wedge compression fracture of unspecified thoracic vertebra, initial encounter for closed fracture: Secondary | ICD-10-CM | POA: Diagnosis present

## 2013-12-01 DIAGNOSIS — M546 Pain in thoracic spine: Secondary | ICD-10-CM

## 2013-12-01 DIAGNOSIS — Z8673 Personal history of transient ischemic attack (TIA), and cerebral infarction without residual deficits: Secondary | ICD-10-CM

## 2013-12-01 DIAGNOSIS — E1165 Type 2 diabetes mellitus with hyperglycemia: Secondary | ICD-10-CM | POA: Diagnosis present

## 2013-12-01 DIAGNOSIS — Z681 Body mass index (BMI) 19 or less, adult: Secondary | ICD-10-CM

## 2013-12-01 DIAGNOSIS — IMO0001 Reserved for inherently not codable concepts without codable children: Secondary | ICD-10-CM | POA: Diagnosis present

## 2013-12-01 DIAGNOSIS — M62838 Other muscle spasm: Secondary | ICD-10-CM | POA: Diagnosis present

## 2013-12-01 DIAGNOSIS — E46 Unspecified protein-calorie malnutrition: Secondary | ICD-10-CM | POA: Diagnosis present

## 2013-12-01 DIAGNOSIS — Z7901 Long term (current) use of anticoagulants: Secondary | ICD-10-CM

## 2013-12-01 DIAGNOSIS — Z9181 History of falling: Secondary | ICD-10-CM

## 2013-12-01 DIAGNOSIS — Z87891 Personal history of nicotine dependence: Secondary | ICD-10-CM

## 2013-12-01 DIAGNOSIS — I82501 Chronic embolism and thrombosis of unspecified deep veins of right lower extremity: Secondary | ICD-10-CM

## 2013-12-01 DIAGNOSIS — E440 Moderate protein-calorie malnutrition: Secondary | ICD-10-CM | POA: Diagnosis present

## 2013-12-01 DIAGNOSIS — IMO0002 Reserved for concepts with insufficient information to code with codable children: Secondary | ICD-10-CM | POA: Diagnosis present

## 2013-12-01 DIAGNOSIS — R1031 Right lower quadrant pain: Secondary | ICD-10-CM

## 2013-12-01 DIAGNOSIS — Z955 Presence of coronary angioplasty implant and graft: Secondary | ICD-10-CM

## 2013-12-01 DIAGNOSIS — I82511 Chronic embolism and thrombosis of right femoral vein: Secondary | ICD-10-CM

## 2013-12-01 DIAGNOSIS — Z9861 Coronary angioplasty status: Secondary | ICD-10-CM

## 2013-12-01 DIAGNOSIS — I2510 Atherosclerotic heart disease of native coronary artery without angina pectoris: Secondary | ICD-10-CM | POA: Diagnosis present

## 2013-12-01 DIAGNOSIS — S32591D Other specified fracture of right pubis, subsequent encounter for fracture with routine healing: Secondary | ICD-10-CM

## 2013-12-01 DIAGNOSIS — M8008XA Age-related osteoporosis with current pathological fracture, vertebra(e), initial encounter for fracture: Secondary | ICD-10-CM | POA: Diagnosis present

## 2013-12-01 DIAGNOSIS — M069 Rheumatoid arthritis, unspecified: Secondary | ICD-10-CM | POA: Diagnosis present

## 2013-12-01 DIAGNOSIS — K219 Gastro-esophageal reflux disease without esophagitis: Secondary | ICD-10-CM | POA: Diagnosis present

## 2013-12-01 DIAGNOSIS — Z86718 Personal history of other venous thrombosis and embolism: Secondary | ICD-10-CM

## 2013-12-01 LAB — URINALYSIS, ROUTINE W REFLEX MICROSCOPIC
BILIRUBIN URINE: NEGATIVE
GLUCOSE, UA: NEGATIVE mg/dL
HGB URINE DIPSTICK: NEGATIVE
Ketones, ur: NEGATIVE mg/dL
LEUKOCYTES UA: NEGATIVE
NITRITE: NEGATIVE
Protein, ur: NEGATIVE mg/dL
Specific Gravity, Urine: 1.011 (ref 1.005–1.030)
UROBILINOGEN UA: 0.2 mg/dL (ref 0.0–1.0)
pH: 7 (ref 5.0–8.0)

## 2013-12-01 LAB — COMPREHENSIVE METABOLIC PANEL
ALBUMIN: 3.5 g/dL (ref 3.5–5.2)
ALK PHOS: 91 U/L (ref 39–117)
ALT: 12 U/L (ref 0–35)
ANION GAP: 13 (ref 5–15)
AST: 22 U/L (ref 0–37)
BILIRUBIN TOTAL: 0.3 mg/dL (ref 0.3–1.2)
BUN: 8 mg/dL (ref 6–23)
CHLORIDE: 96 meq/L (ref 96–112)
CO2: 25 meq/L (ref 19–32)
CREATININE: 0.53 mg/dL (ref 0.50–1.10)
Calcium: 9.2 mg/dL (ref 8.4–10.5)
GFR, EST NON AFRICAN AMERICAN: 87 mL/min — AB (ref >90)
GLUCOSE: 104 mg/dL — AB (ref 70–99)
POTASSIUM: 4.5 meq/L (ref 3.7–5.3)
Sodium: 134 mEq/L — ABNORMAL LOW (ref 137–147)
Total Protein: 6.8 g/dL (ref 6.0–8.3)

## 2013-12-01 LAB — CBC WITH DIFFERENTIAL/PLATELET
Basophils Absolute: 0 10*3/uL (ref 0.0–0.1)
Basophils Relative: 1 % (ref 0–1)
Eosinophils Absolute: 0.4 10*3/uL (ref 0.0–0.7)
Eosinophils Relative: 7 % — ABNORMAL HIGH (ref 0–5)
HCT: 35.4 % — ABNORMAL LOW (ref 36.0–46.0)
HEMOGLOBIN: 11.6 g/dL — AB (ref 12.0–15.0)
LYMPHS ABS: 0.9 10*3/uL (ref 0.7–4.0)
LYMPHS PCT: 16 % (ref 12–46)
MCH: 28.7 pg (ref 26.0–34.0)
MCHC: 32.8 g/dL (ref 30.0–36.0)
MCV: 87.6 fL (ref 78.0–100.0)
MONO ABS: 0.8 10*3/uL (ref 0.1–1.0)
Monocytes Relative: 14 % — ABNORMAL HIGH (ref 3–12)
NEUTROS ABS: 3.5 10*3/uL (ref 1.7–7.7)
NEUTROS PCT: 62 % (ref 43–77)
Platelets: 208 10*3/uL (ref 150–400)
RBC: 4.04 MIL/uL (ref 3.87–5.11)
RDW: 14 % (ref 11.5–15.5)
WBC: 5.6 10*3/uL (ref 4.0–10.5)

## 2013-12-01 LAB — I-STAT TROPONIN, ED: Troponin i, poc: 0.01 ng/mL (ref 0.00–0.08)

## 2013-12-01 LAB — LIPASE, BLOOD: Lipase: 24 U/L (ref 11–59)

## 2013-12-01 MED ORDER — IOHEXOL 300 MG/ML  SOLN: 50.0000 mL | Freq: Once | INTRAMUSCULAR | Status: DC | PRN

## 2013-12-01 NOTE — ED Notes (Signed)
Pt c/o abd pain x 3 weeks, is been treated for compression fracture and cracked ribs. Pt c/o of left upper rib cage pain. Pt c/o lower abd pain with palpitation laying down. Denies n/v, constipation off and on since pain meds started. Pt is on blood thinners.

## 2013-12-01 NOTE — ED Provider Notes (Signed)
CSN: [id number]     Arrival date & time 12/01/13  1652 History   First MD Initiated Contact with Patient 12/01/13 2138     Chief Complaint  Patient presents with  . Abdominal Pain     (Consider location/radiation/quality/duration/timing/severity/associated sxs/prior Treatment) The history is provided by the patient. No language interpreter was used.  [name] is an 80 y/o F with PMhx of angina, iron deficiency, HTN, HLD, COPD, DVT on coumadin, GERD, stroke, RA, OA, DJD presenting to the ED with chest pain with associated shortness of breath and abdominal pain that has been ongoing for the past 4 days. As per granddaughter that accompanies patient - reported that patient was seen in the ED in early July 2015 regarding a fall that resulted in bilateral rib fractures and pubic fracture. As per granddaughter, reported that patient has been experiencing abdominal pain for the past 4 days - patient reported that the pain is all over her abdomen and described as a severe pain. Stated that patient was seen and assessed today at Dr. Trevor Mace office where when he palpated her abdomen she had intense pain and when the patient came up from the examination table had sudden onset of shortness of breath. As per granddaughter, reported that Dr. Ninfa Linden recommended patient to come to the ED. Stated that patient has been using Tramadol and Robaxin without complications. Denied recent fall, injury, blurred vision, sudden loss of vision, nausea, vomiting, headache, dizziness, melena, hematochezia, urinary bowel continence. PCP Dr. Dagmar Hait  Past Medical History  Diagnosis Date  . History of  Angina     Treated with PCI in 2004; no active Symptoms.  . Hiatal hernia   . GERD (gastroesophageal reflux disease)   . Iron deficiency anemia   . Barrett esophagus   . Osteoporosis   . HLD (hyperlipidemia)     Statin intolerant  . Internal hemorrhoids   . Diverticulosis   . Pain     RIGHT SHOULDER WITH LIMITED  ROM--STATES PREVIOUS RT TOTAL SHOULDDER REPLACEMENT-BUT STILL HAS PROBLEMS WITH SHOULDER  . CAD S/P percutaneous coronary angioplasty 2004    PCI-RCA (2.34m x 138mTaxus DES)& Cx (2.75 mm x 14 mm Cypher DES)  . PAD (peripheral artery disease) 1994    History of right iliac stenting, fem-fem bypass in 94; aortobifem bypass/ AAA repair - November 2004; latest ABIs of been roughly 1 bilaterally, followed by Dr. EaDonnetta Hutching. HTN (hypertension)     sometimes  . Shortness of breath     MILD- WITH EXERTION. chronic  . Nocturia   . DVT (deep venous thrombosis)     BLE  . COPD (chronic obstructive pulmonary disease)   . Pneumonia     "several times"  . History of blood transfusion     "not sure why"  . History of stomach ulcers   . Migraine     "used to have them; not anymore" (08/22/2013)  . Stroke     "showed up on XRAY" (08/22/2013)  . Osteoarthritis of both hips      status post multiple hip operations, recent fall with fracture.    . Marland KitchenJD (degenerative joint disease)   . Rheumatoid arthritis   . Fall from slip, trip, or stumble 08/20/2013   Past Surgical History  Procedure Laterality Date  . Back surgery      x 2  . Appendectomy  1950  . Total shoulder replacement Right 2011  . Cholecystectomy      several yrs ago  .  Cataract extraction w/ intraocular lens  implant, bilateral Bilateral 2001-2002  . Total abdominal hysterectomy  1965  . Upper gastrointestinal endoscopy  multiple    w/bx, hiatal hernia, Barretts', esophageal polyp 1x  . Colonoscopy  05/09/2004; 06/08/1999    internal hemorrhoids; diverticulosis  . Shoulder open rotator cuff repair Bilateral     2 times on left  . Lumbar spine surgery  October 2012    spinal stenosis, Dr. Lorin Mercy  . Abdominal aortic aneurysm repair  2004    AortoBi-Fem Bypass  . Carpal tunnel release Left 04/2011  . Total hip arthroplasty  01/26/2012    Procedure: TOTAL HIP ARTHROPLASTY ANTERIOR APPROACH;  Surgeon: Mcarthur Rossetti, MD;  Location: [facility];  Service: Orthopedics;  Laterality: Left;  Left Total Hip Arthroplasty, Anterior Approach (C-Arm)  . Intramedullary (im) nail intertrochanteric Right 04/12/2012    Procedure: INTRAMEDULLARY (IM) NAIL INTERTROCHANTRIC Right;  Surgeon: Mcarthur Rossetti, MD;  Location: [facility];  Service: Orthopedics;  Laterality: Right;  . Doppler echocardiography  06/06/2012    EF 50-60%.  Mild LVH, grade one diastolic function; normal PA pressures and CVP  . Lower extremity venous dopplers  08/16/2012    Partially occlusive right femoral vein thrombosis, also noted in right peroneal vein; no thrombosis in left sided veins.  . Nm myoview ltd  April 2014    Bowel artifact, No ischemia or infarction  . Femoral-femoral bypass graft  1994    Following right iliac stenting  . Doppler echocardiography  06/06/2012    Mild LVH, EF 55-60%.  Normal wall motion.  Mildly elevated LVEDP with grade one diastolic function.  Mild LA dilatation.  Normal PA pressures.  . Tubal ligation  1956  . Excision/release bursa hip Right 12/27/2012    Procedure: RIGHT HIP BURSECTOMY AND COMPRESSION SCREW REMOVAL;  Surgeon: Mcarthur Rossetti, MD;  Location: [facility];  Service: Orthopedics;  Laterality: Right;  . Joint replacement    . Coronary angioplasty with stent placement  2004    RCA: Taxus DES 2.5 mm x 12 mm; Circumflex: Cypher DES 2.75 mm 14 mm  . Cardiac catheterization  October 2005    Patent RCA stent, 20% ISR of Cx stent; EF 50-55%   Family History  Problem Relation Age of Onset  . Prostate cancer Father   . Diabetes Mother   . Colon cancer Neg Hx   . Breast cancer Mother   . Heart disease Mother    History  Substance Use Topics  . Smoking status: Former Smoker -- 1.00 packs/day for 50 years    Types: Cigarettes    Quit date: 04/20/2000  . Smokeless tobacco: Never Used  . Alcohol Use: No   OB History   Grav Para Term Preterm Abortions TAB SAB Ect Mult Living                 Review of Systems    Constitutional: Negative for fever and chills.  Respiratory: Positive for shortness of breath. Negative for chest tightness.   Cardiovascular: Positive for chest pain.  Gastrointestinal: Positive for abdominal pain. Negative for nausea, vomiting and diarrhea.  Musculoskeletal: Positive for back pain. Negative for neck pain and neck stiffness.  Neurological: Negative for weakness, numbness and headaches.      Allergies  Codeine; Hydrocodone; Morphine and related; Other; Penicillins; Darvocet; Demerol; Statins; and Tape  Home Medications   Prior to Admission medications   Medication Sig Start Date End Date Taking? Authorizing Provider  Calcium Carbonate-Vitamin D (  CALTRATE 600+D) 600-400 MG-UNIT per tablet Take 1 tablet by mouth every evening.    Yes Historical Provider, MD  Camphor-Menthol-Methyl Sal (SALONPAS) 1.2-5.7-6.3 % PTCH Place 1 patch onto the skin daily as needed (For pain.).   Yes Historical Provider, MD  cholecalciferol (VITAMIN D) 1000 UNITS tablet Take 2,000 Units by mouth every morning.   Yes Historical Provider, MD  docusate sodium (COLACE) 100 MG capsule Take 100 mg by mouth 2 (two) times daily.    Yes Historical Provider, MD  escitalopram (LEXAPRO) 5 MG tablet Take 5 mg by mouth every evening.    Yes Historical Provider, MD  Flaxseed, Linseed, (FLAX SEED OIL PO) Take 1 tablet by mouth daily at 12 noon.    Yes Historical Provider, MD  hydroxychloroquine (PLAQUENIL) 200 MG tablet Take 200 mg by mouth 2 (two) times daily.    Yes Historical Provider, MD  isosorbide mononitrate (IMDUR) 30 MG 24 hr tablet Take 30 mg by mouth every morning.   Yes Historical Provider, MD  Linaclotide Rolan Lipa) 145 MCG CAPS capsule Take 290 mcg by mouth every morning.    Yes Historical Provider, MD  magnesium citrate SOLN Take 1 Bottle by mouth once.   Yes Historical Provider, MD  methocarbamol (ROBAXIN) 500 MG tablet Take 1 tablet (500 mg total) by mouth 3 (three) times daily as needed (For  muscle spasms.). 01/01/13  Yes Erskine Emery, PA-C  Multiple Vitamin (MULTIVITAMIN WITH MINERALS) TABS tablet Take 1 tablet by mouth daily at 12 noon.   Yes Historical Provider, MD  omeprazole (PRILOSEC OTC) 20 MG tablet Take 20 mg by mouth every morning.    Yes Historical Provider, MD  polyvinyl alcohol (LIQUIFILM TEARS) 1.4 % ophthalmic solution Place 1 drop into both eyes as needed (Dry eyes.). 04/15/12  Yes Srikar Janna Arch, MD  promethazine (PHENERGAN) 12.5 MG tablet Take 12.5 mg by mouth every 6 (six) hours as needed for nausea (Patient takes with Tramadol).   Yes Historical Provider, MD  sulfaSALAzine (AZULFIDINE) 500 MG tablet Take 500 mg by mouth 3 (three) times daily.    Yes Historical Provider, MD  traMADol (ULTRAM) 50 MG tablet Take 2 tablets (100 mg total) by mouth every 6 (six) hours as needed for moderate pain. 08/23/13  Yes Thurnell Lose, MD  warfarin (COUMADIN) 3 MG tablet Take 1 tablet (3 mg total) by mouth See admin instructions. States takes Monday, Wednesday 4.5 mg Tuesday, Thursday Friday Sat Sun 3 mg. Get your INR checked by PCP in 1 day. 08/26/13  Yes Thurnell Lose, MD  glucose monitoring kit (FREESTYLE) monitoring kit 1 each by Does not apply route 4 (four) times daily - after meals and at bedtime. 1 month Diabetic Testing Supplies for QAC-QHS accuchecks.Any brand OK 08/23/13   Thurnell Lose, MD   BP 167/84  Pulse 81  Temp(Src) 97.8 F (36.6 C) (Oral)  Resp 20  SpO2 92% Physical Exam  Nursing note and vitals reviewed. Constitutional: She is oriented to person, place, and time. She appears well-developed and well-nourished. No distress.  HENT:  Head: Normocephalic and atraumatic.  Mouth/Throat: Oropharynx is clear and moist. No oropharyngeal exudate.  Eyes: Conjunctivae and EOM are normal. Pupils are equal, round, and reactive to light. Right eye exhibits no discharge. Left eye exhibits no discharge.  Neck: Normal range of motion. Neck supple. No tracheal deviation  present.  Cardiovascular: Normal rate, regular rhythm and normal heart sounds.  Exam reveals no friction rub.   No murmur heard. Pulses:  Radial pulses are 2+ on the right side, and 2+ on the left side.       Dorsalis pedis pulses are 2+ on the right side, and 2+ on the left side.  Cap refill less than 3 seconds Negative swelling or pitting edema identified to the lower extremities bilaterally  Pulmonary/Chest: Effort normal and breath sounds normal. No respiratory distress. She has no wheezes. She has no rales.   She exhibits tenderness.  Patient is able to speak in full sentences without difficulty Negative use of accessory muscles Negative stridor  Discomfort upon palpation to the posterior aspects of the wrists bilaterally. Negative crepitus upon palpation. Negative tenting of the clavicles bilaterally.   Abdominal: Soft. Normal appearance and bowel sounds are normal. She exhibits no distension. There is generalized tenderness. There is no rebound and no guarding.  Negative abdominal distention Bowel sounds normoactive in all 4 quadrants Abdomen soft upon palpation Diffuse tenderness upon palpation Negative peritoneal signs Negative rigidity or guarding noted  Musculoskeletal: Normal range of motion. She exhibits tenderness.  Full ROM to upper and lower extremities without difficulty noted, negative ataxia noted.  Lymphadenopathy:    She has no cervical adenopathy.  Neurological: She is alert and oriented to person, place, and time. No cranial nerve deficit. She exhibits normal muscle tone. Coordination normal.  Cranial nerves III-XII grossly intact Strength 5+/5+ to upper and lower extremities bilaterally with resistance applied, equal distribution noted Equal grip strength bilaterally Negative facial droop Negative slurred speech Negative aphasia Patient able to follow commands well Patient is able to answer questions appropriately Sensation intact Negative arm drift    Skin: Skin is warm and dry. No rash noted. She is not diaphoretic. No erythema.  Psychiatric: She has a normal mood and affect. Her behavior is normal. Thought content normal.    ED Course  Procedures (including critical care time)  2:57 AM Patient seen and assessed by Dr. Suzie Portela - patient continues to have pain. As per physician, reported that patient has increased abdominal pain and patient continues to breath heavily. As per physician, recommended patient to be admitted.   2:59 AM This provider spoke with Dr. Caryl Bis - discussed case, labs, imaging, vitals, ED course. Patient to be admitted to Telemetry.   Results for orders placed during the hospital encounter of 12/01/13  CBC WITH DIFFERENTIAL      Result Value Ref Range   WBC 5.6  4.0 - 10.5 K/uL   RBC 4.04  3.87 - 5.11 MIL/uL   Hemoglobin 11.6 (*) 12.0 - 15.0 g/dL   HCT 35.4 (*) 36.0 - 46.0 %   MCV 87.6  78.0 - 100.0 fL   MCH 28.7  26.0 - 34.0 pg   MCHC 32.8  30.0 - 36.0 g/dL   RDW 14.0  11.5 - 15.5 %   Platelets 208  150 - 400 K/uL   Neutrophils Relative % 62  43 - 77 %   Neutro Abs 3.5  1.7 - 7.7 K/uL   Lymphocytes Relative 16  12 - 46 %   Lymphs Abs 0.9  0.7 - 4.0 K/uL   Monocytes Relative 14 (*) 3 - 12 %   Monocytes Absolute 0.8  0.1 - 1.0 K/uL   Eosinophils Relative 7 (*) 0 - 5 %   Eosinophils Absolute 0.4  0.0 - 0.7 K/uL   Basophils Relative 1  0 - 1 %   Basophils Absolute 0.0  0.0 - 0.1 K/uL  COMPREHENSIVE METABOLIC PANEL  Result Value Ref Range   Sodium 134 (*) 137 - 147 mEq/L   Potassium 4.5  3.7 - 5.3 mEq/L   Chloride 96  96 - 112 mEq/L   CO2 25  19 - 32 mEq/L   Glucose, Bld 104 (*) 70 - 99 mg/dL   BUN 8  6 - 23 mg/dL   Creatinine, Ser 0.53  0.50 - 1.10 mg/dL   Calcium 9.2  8.4 - 10.5 mg/dL   Total Protein 6.8  6.0 - 8.3 g/dL   Albumin 3.5  3.5 - 5.2 g/dL   AST 22  0 - 37 U/L   ALT 12  0 - 35 U/L   Alkaline Phosphatase 91  39 - 117 U/L   Total Bilirubin 0.3  0.3 - 1.2 mg/dL   GFR calc non Af  Amer 87 (*) >90 mL/min   GFR calc Af Amer >90  >90 mL/min   Anion gap 13  5 - 15  URINALYSIS, ROUTINE W REFLEX MICROSCOPIC      Result Value Ref Range   Color, Urine YELLOW  YELLOW   APPearance CLEAR  CLEAR   Specific Gravity, Urine 1.011  1.005 - 1.030   pH 7.0  5.0 - 8.0   Glucose, UA NEGATIVE  NEGATIVE mg/dL   Hgb urine dipstick NEGATIVE  NEGATIVE   Bilirubin Urine NEGATIVE  NEGATIVE   Ketones, ur NEGATIVE  NEGATIVE mg/dL   Protein, ur NEGATIVE  NEGATIVE mg/dL   Urobilinogen, UA 0.2  0.0 - 1.0 mg/dL   Nitrite NEGATIVE  NEGATIVE   Leukocytes, UA NEGATIVE  NEGATIVE  LIPASE, BLOOD      Result Value Ref Range   Lipase 24  11 - 59 U/L  PROTIME-INR      Result Value Ref Range   Prothrombin Time 25.0 (*) 11.6 - 15.2 seconds   INR 2.24 (*) 0.00 - 1.49  I-STAT TROPOININ, ED      Result Value Ref Range   Troponin i, poc 0.01  0.00 - 0.08 ng/mL   Comment 3             Labs Review Labs Reviewed  CBC WITH DIFFERENTIAL - Abnormal; Notable for the following:    Hemoglobin 11.6 (*)    HCT 35.4 (*)    Monocytes Relative 14 (*)    Eosinophils Relative 7 (*)    All other components within normal limits  COMPREHENSIVE METABOLIC PANEL - Abnormal; Notable for the following:    Sodium 134 (*)    Glucose, Bld 104 (*)    GFR calc non Af Amer 87 (*)    All other components within normal limits  PROTIME-INR - Abnormal; Notable for the following:    Prothrombin Time 25.0 (*)    INR 2.24 (*)    All other components within normal limits  URINALYSIS, ROUTINE W REFLEX MICROSCOPIC  LIPASE, BLOOD  I-STAT TROPOININ, ED  I-STAT CG4 LACTIC ACID, ED    Imaging Review Ct Angio Chest Pe W/cm &/or Wo Cm  12/02/2013   CLINICAL DATA:  Abdominal pain. Left upper rib cage pain. Initial encounter.  EXAM: CT ANGIOGRAPHY CHEST  CT ABDOMEN AND PELVIS WITH CONTRAST  TECHNIQUE: Multidetector CT imaging of the chest was performed using the standard protocol during bolus administration of intravenous contrast.  Multiplanar CT image reconstructions and MIPs were obtained to evaluate the vascular anatomy. Multidetector CT imaging of the abdomen and pelvis was performed using the standard protocol during bolus administration of intravenous contrast.  CONTRAST:  118m OMNIPAQUE IOHEXOL 350 MG/ML SOLN  COMPARISON:  None.  FINDINGS: CTA CHEST FINDINGS  THORACIC INLET/BODY WALL:  15 mm thyroid nodule in the inferior right lobe. This is likely incidental based on size and patient age.  No acute findings.  MEDIASTINUM:  Normal heart size. No pericardial effusion. Diffuse atherosclerosis, including the coronary arteries. Status post coronary stenting. Negative for acute aortic finding (as permitted by non opacification of the mid thoracic aorta). Negative for pulmonary embolism. Moderate sliding-type hiatal hernia without obstruction.  LUNG WINDOWS:  Subpleural reticulation which is likely chronic. There is no edema, pneumonia, effusion, or pneumothorax. No suspicious pulmonary nodules.  OSSEOUS:  There is a T9 compression fracture with height loss greater than 75%. Although an acute fracture line is not visible, there is surrounding relatively low-density perispinal fat infiltration - likely edema or hemorrhage. No definite bowing of the fracture, enhancing soft tissue, or sclerosis suggestive of pathologic fracture. No subluxation.  Remote T7 compression fracture status post bone augmentation.  Remote right-sided rib fractures.  Right glenohumeral arthroplasty with anterior subluxation of humeral prosthesis, commonly encounter.  CT ABDOMEN and PELVIS FINDINGS  BODY WALL: Unremarkable.  ABDOMEN/PELVIS:  Liver: No focal abnormality.  Biliary: Cholecystectomy.  Pancreas: Unremarkable.  Spleen: Granulomatous changes. Heterogeneous enhancement of the inferior spleen resolves on delayed imaging. No splenic mass.  Adrenals: Unremarkable.  Kidneys and ureters: No hydronephrosis or stone.  Bladder: Moderately distended.  Reproductive: Low  pelvic floor. Status post hysterectomy. No adnexal mass.  Bowel: Moderate sliding-type hiatal hernia. No bowel obstruction. No pericecal inflammation.  Retroperitoneum: No mass or adenopathy.  Peritoneum: No ascites or pneumoperitoneum.  Vascular: Extensive atherosclerosis, status post aorto bi femoral bypass. The graft is patent. No acute vascular findings.  OSSEOUS: Left total hip arthroplasty. Remote proximal right femur ORIF. No adverse findings. Remote insufficiency fractures of the right sacral ala and inferior pubic ramus. No acute fracture identified. L4-5 laminotomy.  Review of the MIP images confirms the above findings.  IMPRESSION: 1. Recent T9 compression fracture with severe height loss but no bony retropulsion. 2. Negative for pulmonary embolism. 3. No acute intra-abdominal findings.   Electronically Signed   By: JJorje GuildM.D.   On: 12/02/2013 01:23   Ct Abdomen Pelvis W Contrast  12/02/2013   CLINICAL DATA:  Abdominal pain. Left upper rib cage pain. Initial encounter.  EXAM: CT ANGIOGRAPHY CHEST  CT ABDOMEN AND PELVIS WITH CONTRAST  TECHNIQUE: Multidetector CT imaging of the chest was performed using the standard protocol during bolus administration of intravenous contrast. Multiplanar CT image reconstructions and MIPs were obtained to evaluate the vascular anatomy. Multidetector CT imaging of the abdomen and pelvis was performed using the standard protocol during bolus administration of intravenous contrast.  CONTRAST:  1033mOMNIPAQUE IOHEXOL 350 MG/ML SOLN  COMPARISON:  None.  FINDINGS: CTA CHEST FINDINGS  THORACIC INLET/BODY WALL:  15 mm thyroid nodule in the inferior right lobe. This is likely incidental based on size and patient age.  No acute findings.  MEDIASTINUM:  Normal heart size. No pericardial effusion. Diffuse atherosclerosis, including the coronary arteries. Status post coronary stenting. Negative for acute aortic finding (as permitted by non opacification of the mid  thoracic aorta). Negative for pulmonary embolism. Moderate sliding-type hiatal hernia without obstruction.  LUNG WINDOWS:  Subpleural reticulation which is likely chronic. There is no edema, pneumonia, effusion, or pneumothorax. No suspicious pulmonary nodules.  OSSEOUS:  There is a T9 compression fracture with height loss greater than 75%. Although an  acute fracture line is not visible, there is surrounding relatively low-density perispinal fat infiltration - likely edema or hemorrhage. No definite bowing of the fracture, enhancing soft tissue, or sclerosis suggestive of pathologic fracture. No subluxation.  Remote T7 compression fracture status post bone augmentation.  Remote right-sided rib fractures.  Right glenohumeral arthroplasty with anterior subluxation of humeral prosthesis, commonly encounter.  CT ABDOMEN and PELVIS FINDINGS  BODY WALL: Unremarkable.  ABDOMEN/PELVIS:  Liver: No focal abnormality.  Biliary: Cholecystectomy.  Pancreas: Unremarkable.  Spleen: Granulomatous changes. Heterogeneous enhancement of the inferior spleen resolves on delayed imaging. No splenic mass.  Adrenals: Unremarkable.  Kidneys and ureters: No hydronephrosis or stone.  Bladder: Moderately distended.  Reproductive: Low pelvic floor. Status post hysterectomy. No adnexal mass.  Bowel: Moderate sliding-type hiatal hernia. No bowel obstruction. No pericecal inflammation.  Retroperitoneum: No mass or adenopathy.  Peritoneum: No ascites or pneumoperitoneum.  Vascular: Extensive atherosclerosis, status post aorto bi femoral bypass. The graft is patent. No acute vascular findings.  OSSEOUS: Left total hip arthroplasty. Remote proximal right femur ORIF. No adverse findings. Remote insufficiency fractures of the right sacral ala and inferior pubic ramus. No acute fracture identified. L4-5 laminotomy.  Review of the MIP images confirms the above findings.  IMPRESSION: 1. Recent T9 compression fracture with severe height loss but no bony  retropulsion. 2. Negative for pulmonary embolism. 3. No acute intra-abdominal findings.   Electronically Signed   By: Jorje Guild M.D.   On: 12/02/2013 01:23     EKG Interpretation   Date/Time:  Monday December 01 2013 17:07:46 EDT Ventricular Rate:  75 PR Interval:  147 QRS Duration: 88 QT Interval:  400 QTC Calculation: 447 R Axis:   -16 Text Interpretation:  Sinus rhythm Ventricular premature complex  Borderline left axis deviation No significant change since last tracing  Confirmed by POLLINA  MD, CHRISTOPHER [phone]) on 12/01/2013 5:22:35 PM      MDM   Final diagnoses:  Compression fracture of thoracic vertebra, closed, initial encounter  Closed rib fracture, right, with routine healing, subsequent encounter  Pubic ramus fracture, right, with routine healing, subsequent encounter    Medications  sodium chloride 0.9 % bolus 500 mL (500 mLs Intravenous New Bag/Given 12/02/13 0030)  iohexol (OMNIPAQUE) 350 MG/ML injection 100 mL (100 mLs Intravenous Contrast Given 12/02/13 0040)  traMADol (ULTRAM) tablet 50 mg (50 mg Oral Given 12/02/13 0132)   Filed Vitals:   12/01/13 1701 12/01/13 2011 12/01/13 2135 12/02/13 0137  BP: 134/72 166/71 162/61 167/84  Pulse: 77 77  81  Temp: 97.8 F (36.6 C)     TempSrc: Oral     Resp: 18 19 22 20   SpO2: 100% 99% 100% 92%    EKG noted normal sinus rhythm with a heart rate of 75 beats per minute-premature ventricular complexes identified-no acute significant change since last tracing. I-STAT troponin negative elevation. INR mildly elevated at 2.24. CBC unremarkable. CMP unremarkable. Lipase negative elevation. Urinalysis negative for infection. CT angiogram of chest negative for pulmonary embolism. CT noted recent T9 compression fracture with severe height loss. CT abdomen and pelvis with contrast negative for acute intra-abdominal findings. Negative acute abnormalities noted. Negative aortic dissection. INR appears to be therapeutic. T9  compression fracture noted - patient does have history of osteoporosis, suspicion to be spontaneous possibily. Patient able to ambulate without difficulty or signs of respiratory distress. Patient seen and assessed by attending physician, Dr. Suzie Portela - overnight physician - after imaging. Recommended admission of patient to pain control.  Discussed case with [facility] - patient to be admitted for pain control and recent T9 compression fracture. Discussed admission plan with patient who agreed to plan of care - so does granddaughter. Patient stable for transfer.   Jamse Mead, PA-C 12/02/13 1225

## 2013-12-01 NOTE — ED Provider Notes (Signed)
Medical screening examination/treatment/procedure(s) were conducted as a shared visit with non-physician practitioner(s) and myself.  I personally evaluated the patient during the encounter.   EKG Interpretation   Date/Time:  Monday December 01 2013 17:07:46 EDT Ventricular Rate:  75 PR Interval:  147 QRS Duration: 88 QT Interval:  400 QTC Calculation: 447 R Axis:   -16 Text Interpretation:  Sinus rhythm Ventricular premature complex  Borderline left axis deviation No significant change since last tracing  Confirmed by POLLINA  MD, CHRISTOPHER [phone]) on 12/01/2013 5:22:27 PM     80 year old female with 3-4 days of sharp stabbing constant pleuritic somewhat positional nonexertional chest pain with shortness of breath without fever without cough but with diffuse abdominal pain in a patient on Coumadin for prior DVT with mild diffuse abdominal tenderness without rebound. Moderate risk Wells PE so CTA chest ordered.  Hurman Horn, MD 12/21/13 [phone]

## 2013-12-02 ENCOUNTER — Encounter (HOSPITAL_COMMUNITY): Payer: Self-pay | Admitting: Radiology

## 2013-12-02 DIAGNOSIS — Z8673 Personal history of transient ischemic attack (TIA), and cerebral infarction without residual deficits: Secondary | ICD-10-CM | POA: Diagnosis not present

## 2013-12-02 DIAGNOSIS — I82501 Chronic embolism and thrombosis of unspecified deep veins of right lower extremity: Secondary | ICD-10-CM | POA: Diagnosis not present

## 2013-12-02 DIAGNOSIS — J449 Chronic obstructive pulmonary disease, unspecified: Secondary | ICD-10-CM | POA: Diagnosis present

## 2013-12-02 DIAGNOSIS — S301XXA Contusion of abdominal wall, initial encounter: Secondary | ICD-10-CM | POA: Diagnosis present

## 2013-12-02 DIAGNOSIS — S22000A Wedge compression fracture of unspecified thoracic vertebra, initial encounter for closed fracture: Secondary | ICD-10-CM | POA: Diagnosis not present

## 2013-12-02 DIAGNOSIS — Z9181 History of falling: Secondary | ICD-10-CM | POA: Diagnosis not present

## 2013-12-02 DIAGNOSIS — M069 Rheumatoid arthritis, unspecified: Secondary | ICD-10-CM | POA: Diagnosis present

## 2013-12-02 DIAGNOSIS — R109 Unspecified abdominal pain: Secondary | ICD-10-CM | POA: Diagnosis present

## 2013-12-02 DIAGNOSIS — Z955 Presence of coronary angioplasty implant and graft: Secondary | ICD-10-CM | POA: Diagnosis not present

## 2013-12-02 DIAGNOSIS — Z86718 Personal history of other venous thrombosis and embolism: Secondary | ICD-10-CM | POA: Diagnosis not present

## 2013-12-02 DIAGNOSIS — S301XXS Contusion of abdominal wall, sequela: Secondary | ICD-10-CM | POA: Diagnosis not present

## 2013-12-02 DIAGNOSIS — K5900 Constipation, unspecified: Secondary | ICD-10-CM | POA: Diagnosis present

## 2013-12-02 DIAGNOSIS — E119 Type 2 diabetes mellitus without complications: Secondary | ICD-10-CM | POA: Diagnosis present

## 2013-12-02 DIAGNOSIS — I10 Essential (primary) hypertension: Secondary | ICD-10-CM | POA: Diagnosis present

## 2013-12-02 DIAGNOSIS — M8008XA Age-related osteoporosis with current pathological fracture, vertebra(e), initial encounter for fracture: Secondary | ICD-10-CM | POA: Diagnosis present

## 2013-12-02 DIAGNOSIS — Z7901 Long term (current) use of anticoagulants: Secondary | ICD-10-CM | POA: Diagnosis not present

## 2013-12-02 DIAGNOSIS — K219 Gastro-esophageal reflux disease without esophagitis: Secondary | ICD-10-CM | POA: Diagnosis present

## 2013-12-02 DIAGNOSIS — Z96642 Presence of left artificial hip joint: Secondary | ICD-10-CM | POA: Diagnosis present

## 2013-12-02 DIAGNOSIS — R1031 Right lower quadrant pain: Secondary | ICD-10-CM | POA: Diagnosis not present

## 2013-12-02 DIAGNOSIS — Z96611 Presence of right artificial shoulder joint: Secondary | ICD-10-CM | POA: Diagnosis present

## 2013-12-02 DIAGNOSIS — Z681 Body mass index (BMI) 19 or less, adult: Secondary | ICD-10-CM | POA: Diagnosis not present

## 2013-12-02 DIAGNOSIS — M62838 Other muscle spasm: Secondary | ICD-10-CM | POA: Diagnosis present

## 2013-12-02 DIAGNOSIS — I2510 Atherosclerotic heart disease of native coronary artery without angina pectoris: Secondary | ICD-10-CM | POA: Diagnosis present

## 2013-12-02 DIAGNOSIS — E440 Moderate protein-calorie malnutrition: Secondary | ICD-10-CM | POA: Diagnosis present

## 2013-12-02 DIAGNOSIS — Z87891 Personal history of nicotine dependence: Secondary | ICD-10-CM | POA: Diagnosis not present

## 2013-12-02 LAB — CBC
HEMATOCRIT: 33.9 % — AB (ref 36.0–46.0)
Hemoglobin: 11.2 g/dL — ABNORMAL LOW (ref 12.0–15.0)
MCH: 28.6 pg (ref 26.0–34.0)
MCHC: 33 g/dL (ref 30.0–36.0)
MCV: 86.7 fL (ref 78.0–100.0)
Platelets: 204 10*3/uL (ref 150–400)
RBC: 3.91 MIL/uL (ref 3.87–5.11)
RDW: 14 % (ref 11.5–15.5)
WBC: 5.7 10*3/uL (ref 4.0–10.5)

## 2013-12-02 LAB — BASIC METABOLIC PANEL
ANION GAP: 11 (ref 5–15)
BUN: 5 mg/dL — ABNORMAL LOW (ref 6–23)
CALCIUM: 8.9 mg/dL (ref 8.4–10.5)
CO2: 25 meq/L (ref 19–32)
Chloride: 99 mEq/L (ref 96–112)
Creatinine, Ser: 0.47 mg/dL — ABNORMAL LOW (ref 0.50–1.10)
GFR calc Af Amer: >90 mL/min (ref >90)
GFR calc non Af Amer: >90 mL/min (ref >90)
Glucose, Bld: 114 mg/dL — ABNORMAL HIGH (ref 70–99)
POTASSIUM: 3.9 meq/L (ref 3.7–5.3)
SODIUM: 135 meq/L — AB (ref 137–147)

## 2013-12-02 LAB — PROTIME-INR
INR: 2.09 — ABNORMAL HIGH (ref 0.00–1.49)
INR: 1.98 — ABNORMAL HIGH (ref 0.00–1.49)
INR: 2.24 — AB (ref 0.00–1.49)
Prothrombin Time: 22.7 s — ABNORMAL HIGH (ref 11.6–15.2)
Prothrombin Time: 23.6 s — ABNORMAL HIGH (ref 11.6–15.2)
Prothrombin Time: 25 seconds — ABNORMAL HIGH (ref 11.6–15.2)

## 2013-12-02 LAB — I-STAT CG4 LACTIC ACID, ED: Lactic Acid, Venous: 0.86 mmol/L (ref 0.5–2.2)

## 2013-12-02 LAB — TROPONIN I
Troponin I: <0.3 ng/mL (ref <0.30)
Troponin I: <0.3 ng/mL (ref <0.30)
Troponin I: <0.3 ng/mL (ref <0.30)

## 2013-12-02 MED ORDER — BOOST PLUS PO LIQD
237.0000 mL | ORAL | Status: DC
  Administered 2013-12-03 – 2013-12-08 (×4): 237 mL via ORAL
  Filled 2013-12-02 (×7): qty 237

## 2013-12-02 MED ORDER — TRAMADOL HCL 50 MG PO TABS
50.0000 mg | ORAL_TABLET | Freq: Once | ORAL | Status: AC
  Administered 2013-12-02: 50 mg via ORAL
  Filled 2013-12-02: qty 1

## 2013-12-02 MED ORDER — HEPARIN BOLUS VIA INFUSION
1500.0000 [IU] | Freq: Once | INTRAVENOUS | Status: AC
  Administered 2013-12-02: 1500 [IU] via INTRAVENOUS
  Filled 2013-12-02: qty 1500

## 2013-12-02 MED ORDER — ISOSORBIDE MONONITRATE ER 30 MG PO TB24
30.0000 mg | ORAL_TABLET | Freq: Every morning | ORAL | Status: DC
  Administered 2013-12-02 – 2013-12-09 (×8): 30 mg via ORAL
  Filled 2013-12-02 (×9): qty 1

## 2013-12-02 MED ORDER — WARFARIN SODIUM 4 MG PO TABS: 4.5000 mg | ORAL_TABLET | ORAL | Status: DC

## 2013-12-02 MED ORDER — BISACODYL 10 MG RE SUPP: 10.0000 mg | Freq: Every day | RECTAL | Status: DC | PRN

## 2013-12-02 MED ORDER — HYDROXYCHLOROQUINE SULFATE 200 MG PO TABS
200.0000 mg | ORAL_TABLET | Freq: Two times a day (BID) | ORAL | Status: DC
  Administered 2013-12-02 – 2013-12-10 (×17): 200 mg via ORAL
  Filled 2013-12-02 (×18): qty 1

## 2013-12-02 MED ORDER — SULFASALAZINE 500 MG PO TABS
500.0000 mg | ORAL_TABLET | Freq: Three times a day (TID) | ORAL | Status: DC
  Administered 2013-12-02 – 2013-12-10 (×23): 500 mg via ORAL
  Filled 2013-12-02 (×27): qty 1

## 2013-12-02 MED ORDER — ADULT MULTIVITAMIN W/MINERALS CH
1.0000 | ORAL_TABLET | Freq: Every day | ORAL | Status: DC
  Administered 2013-12-02 – 2013-12-10 (×9): 1 via ORAL
  Filled 2013-12-02 (×9): qty 1

## 2013-12-02 MED ORDER — METHOCARBAMOL 500 MG PO TABS
500.0000 mg | ORAL_TABLET | Freq: Four times a day (QID) | ORAL | Status: DC | PRN
  Administered 2013-12-02 – 2013-12-06 (×6): 500 mg via ORAL
  Filled 2013-12-02 (×6): qty 1

## 2013-12-02 MED ORDER — MAGNESIUM CITRATE PO SOLN
1.0000 | Freq: Once | ORAL | Status: AC
  Administered 2013-12-02: 1 via ORAL

## 2013-12-02 MED ORDER — BACLOFEN 10 MG PO TABS
10.0000 mg | ORAL_TABLET | Freq: Three times a day (TID) | ORAL | Status: DC
  Administered 2013-12-02 – 2013-12-04 (×6): 10 mg via ORAL
  Filled 2013-12-02 (×8): qty 1

## 2013-12-02 MED ORDER — HYDROMORPHONE HCL 1 MG/ML IJ SOLN: 0.5000 mg | Freq: Once | INTRAMUSCULAR | Status: DC

## 2013-12-02 MED ORDER — OMEPRAZOLE MAGNESIUM 20 MG PO TBEC
20.0000 mg | DELAYED_RELEASE_TABLET | Freq: Every morning | ORAL | Status: DC
  Administered 2013-12-02 – 2013-12-10 (×9): 20 mg via ORAL
  Filled 2013-12-02 (×10): qty 1

## 2013-12-02 MED ORDER — WARFARIN SODIUM 3 MG PO TABS: 3.0000 mg | ORAL_TABLET | ORAL | Status: DC

## 2013-12-02 MED ORDER — POLYVINYL ALCOHOL 1.4 % OP SOLN
1.0000 [drp] | OPHTHALMIC | Status: DC | PRN
  Administered 2013-12-07 – 2013-12-09 (×3): 1 [drp] via OPHTHALMIC
  Filled 2013-12-02 (×2): qty 15

## 2013-12-02 MED ORDER — SODIUM CHLORIDE 0.9 % IJ SOLN
3.0000 mL | Freq: Two times a day (BID) | INTRAMUSCULAR | Status: DC
  Administered 2013-12-02 – 2013-12-10 (×9): 3 mL via INTRAVENOUS

## 2013-12-02 MED ORDER — SODIUM CHLORIDE 0.9 % IV BOLUS (SEPSIS)
500.0000 mL | Freq: Once | INTRAVENOUS | Status: AC
  Administered 2013-12-02: 500 mL via INTRAVENOUS

## 2013-12-02 MED ORDER — WARFARIN SODIUM 3 MG PO TABS
3.0000 mg | ORAL_TABLET | ORAL | Status: DC
  Filled 2013-12-02: qty 1

## 2013-12-02 MED ORDER — ONDANSETRON HCL 4 MG PO TABS: 4.0000 mg | ORAL_TABLET | Freq: Four times a day (QID) | ORAL | Status: DC | PRN

## 2013-12-02 MED ORDER — PROMETHAZINE HCL 25 MG PO TABS: 12.5000 mg | ORAL_TABLET | Freq: Four times a day (QID) | ORAL | Status: DC | PRN

## 2013-12-02 MED ORDER — SODIUM CHLORIDE 0.9 % IV SOLN
INTRAVENOUS | Status: DC
  Administered 2013-12-02 – 2013-12-07 (×5): via INTRAVENOUS

## 2013-12-02 MED ORDER — ESCITALOPRAM OXALATE 5 MG PO TABS
5.0000 mg | ORAL_TABLET | Freq: Every evening | ORAL | Status: DC
Start: 2013-12-02 — End: 2013-12-10
  Administered 2013-12-02 – 2013-12-08 (×7): 5 mg via ORAL
  Filled 2013-12-02 (×9): qty 1

## 2013-12-02 MED ORDER — TRAMADOL HCL 50 MG PO TABS
100.0000 mg | ORAL_TABLET | Freq: Four times a day (QID) | ORAL | Status: DC | PRN
  Administered 2013-12-02 – 2013-12-04 (×3): 100 mg via ORAL
  Filled 2013-12-02 (×4): qty 2

## 2013-12-02 MED ORDER — IOHEXOL 350 MG/ML SOLN
100.0000 mL | Freq: Once | INTRAVENOUS | Status: AC | PRN
  Administered 2013-12-02: 100 mL via INTRAVENOUS

## 2013-12-02 MED ORDER — HEPARIN (PORCINE) IN NACL 100-0.45 UNIT/ML-% IJ SOLN
800.0000 [IU]/h | INTRAMUSCULAR | Status: DC
  Administered 2013-12-02: 800 [IU]/h via INTRAVENOUS
  Filled 2013-12-02: qty 250

## 2013-12-02 MED ORDER — WARFARIN - PHARMACIST DOSING INPATIENT: Freq: Every day | Status: DC

## 2013-12-02 MED ORDER — LINACLOTIDE 145 MCG PO CAPS
290.0000 ug | ORAL_CAPSULE | Freq: Every morning | ORAL | Status: DC
  Administered 2013-12-02 – 2013-12-07 (×6): 290 ug via ORAL
  Filled 2013-12-02 (×6): qty 2

## 2013-12-02 MED ORDER — ONDANSETRON HCL 4 MG/2ML IJ SOLN: 4.0000 mg | Freq: Four times a day (QID) | INTRAMUSCULAR | Status: DC | PRN

## 2013-12-02 MED ORDER — SENNA 8.6 MG PO TABS
1.0000 | ORAL_TABLET | Freq: Two times a day (BID) | ORAL | Status: DC
Start: 2013-12-02 — End: 2013-12-10
  Administered 2013-12-02 – 2013-12-10 (×13): 8.6 mg via ORAL
  Filled 2013-12-02 (×14): qty 1

## 2013-12-02 MED ORDER — DOCUSATE SODIUM 100 MG PO CAPS
100.0000 mg | ORAL_CAPSULE | Freq: Two times a day (BID) | ORAL | Status: DC
  Administered 2013-12-02 – 2013-12-07 (×11): 100 mg via ORAL
  Filled 2013-12-02 (×12): qty 1

## 2013-12-02 NOTE — Progress Notes (Signed)
INITIAL NUTRITION ASSESSMENT  DOCUMENTATION CODES Per approved criteria  -Not Applicable   INTERVENTION: -Recommend Boost Plus once daily -Encouraged intake to improve weakness and recovery post compression fracture -Encouraged fluid and fiber intake to assist with constipation -RD to continue to monitor  NUTRITION DIAGNOSIS: Inadequate oral intake related to abd pain/constipation as evidenced by decreased PO intake for one week.   Goal: Pt to meet >/= 90% of their estimated nutrition needs    Monitor: Total protein/energy intake, labs, weights, GI profile  Reason for Assessment: MST  80 y.o. female  Admitting Dx: <principal problem not specified>  ASSESSMENT: 80 y/o F with PMhx of angina, iron deficiency, HTN, HLD, COPD, DVT on coumadin, GERD, stroke, RA, OA, DJD presented to [facility] ED with main concern of several days duration of persistent abd discomfort, associated with cramping, poor oral intake, 5/10 in severity, with no specific alleviating or aggravating factors  -Pt reported poor PO intake for one week d/t constipation, abd pain, and medications -Diet recall indicated pt consuming two meals daily and one Boost supplement daily. Lives with sister, who assists in preparing meals and obtaining supplements for pt.  -Endorsed weight loss, pt reported 7 lbs in several weeks; however pt's daughter noted it may have been < 5 lbs. Previous medical records indicates pt's usual body weight around 116-118 lb (3 lb weight loss, non-severe for time frame) -Appetite improving during admit, consumed 100% of breakfast. Receiving bowel regimen to assist with constipation and abd pain relief. Requesting Boost supplement; however would like to receive it tomorrow.  -Encouraged fluid and fiber for regular BMs; also promoted nutrition for assistance in T9 compression fracture recovery  Height: Ht Readings from Last 1 Encounters:  12/02/13 5\' 3"  (1.6 m)    Weight: Wt Readings from Last 1  Encounters:  12/02/13 113 lb 11.2 oz (51.574 kg)    Ideal Body Weight: 115  % Ideal Body Weight: 98%  Wt Readings from Last 10 Encounters:  12/02/13 113 lb 11.2 oz (51.574 kg)  12/27/12 116 lb (52.617 kg)  12/27/12 116 lb (52.617 kg)  12/24/12 116 lb (52.617 kg)  12/12/12 116 lb 6.4 oz (52.799 kg)  09/11/12 116 lb 12.8 oz (52.98 kg)  07/02/12 116 lb 9.6 oz (52.889 kg)  06/25/12 116 lb 9.6 oz (52.889 kg)  06/21/12 116 lb 9.6 oz (52.889 kg)  06/06/12 118 lb (53.524 kg)    Usual Body Weight: 116 lb  % Usual Body Weight: 97%  BMI:  Body mass index is 20.15 kg/(m^2).  Estimated Nutritional Needs: Kcal: 1300-1500 Protein: 60-70 gram Fluid: 1500 ml daily  Skin: WDL  Diet Order: General  EDUCATION NEEDS: -Education needs addressed   Intake/Output Summary (Last 24 hours) at 12/02/13 1241 Last data filed at 12/02/13 0900  Gross per 24 hour  Intake    360 ml  Output      0 ml  Net    360 ml    Last BM: 10/12   Labs:   Recent Labs Lab 12/01/13 1723 12/02/13 0640  NA 134* 135*  K 4.5 3.9  CL 96 99  CO2 25 25  BUN 8 5*  CREATININE 0.53 0.47*  CALCIUM 9.2 8.9  GLUCOSE 104* 114*    CBG (last 3)  No results found for this basename: GLUCAP,  in the last 72 hours  Scheduled Meds: . baclofen  10 mg Oral TID  . docusate sodium  100 mg Oral BID  . escitalopram  5 mg Oral QPM  .  hydroxychloroquine  200 mg Oral BID  . isosorbide mononitrate  30 mg Oral q morning - 10a  . Linaclotide  290 mcg Oral q morning - 10a  . multivitamin with minerals  1 tablet Oral Q1200  . omeprazole  20 mg Oral q morning - 10a  . senna  1 tablet Oral BID  . sodium chloride  3 mL Intravenous Q12H  . sulfaSALAzine  500 mg Oral TID  . warfarin  3 mg Oral Once per day on Sun Tue Thu Fri Sat  . [START ON 12/03/2013] warfarin  4.5 mg Oral Once per day on Mon Wed  . Warfarin - Pharmacist Dosing Inpatient   Does not apply q1800    Continuous Infusions: . sodium chloride 75 mL/hr at  12/02/13 0526    Past Medical History  Diagnosis Date  . History of  Angina     Treated with PCI in 2004; no active Symptoms.  . Hiatal hernia   . GERD (gastroesophageal reflux disease)   . Iron deficiency anemia   . Barrett esophagus   . Osteoporosis   . HLD (hyperlipidemia)     Statin intolerant  . Internal hemorrhoids   . Diverticulosis   . Pain     RIGHT SHOULDER WITH LIMITED ROM--STATES PREVIOUS RT TOTAL SHOULDDER REPLACEMENT-BUT STILL HAS PROBLEMS WITH SHOULDER  . CAD S/P percutaneous coronary angioplasty 2004    PCI-RCA (2.66mm x 23mm Taxus DES)& Cx (2.75 mm x 14 mm Cypher DES)  . PAD (peripheral artery disease) 1994    History of right iliac stenting, fem-fem bypass in 94; aortobifem bypass/ AAA repair - November 2004; latest ABIs of been roughly 1 bilaterally, followed by Dr. Arbie Cookey  . HTN (hypertension)     sometimes  . Shortness of breath     MILD- WITH EXERTION. chronic  . Nocturia   . DVT (deep venous thrombosis)     BLE  . COPD (chronic obstructive pulmonary disease)   . Pneumonia     "several times"  . History of blood transfusion     "not sure why"  . History of stomach ulcers   . Migraine     "used to have them; not anymore" (08/22/2013)  . Stroke     "showed up on XRAY" (08/22/2013)  . Osteoarthritis of both hips      status post multiple hip operations, recent fall with fracture.    [name] DJD (degenerative joint disease)   . Rheumatoid arthritis   . Fall from slip, trip, or stumble 08/20/2013    Past Surgical History  Procedure Laterality Date  . Back surgery      x 2  . Appendectomy  1950  . Total shoulder replacement Right 2011  . Cholecystectomy      several yrs ago  . Cataract extraction w/ intraocular lens  implant, bilateral Bilateral 2001-2002  . Total abdominal hysterectomy  1965  . Upper gastrointestinal endoscopy  multiple    w/bx, hiatal hernia, Barretts', esophageal polyp 1x  . Colonoscopy  05/09/2004; 06/08/1999    internal hemorrhoids;  diverticulosis  . Shoulder open rotator cuff repair Bilateral     2 times on left  . Lumbar spine surgery  October 2012    spinal stenosis, Dr. Ophelia Charter  . Abdominal aortic aneurysm repair  2004    AortoBi-Fem Bypass  . Carpal tunnel release Left 04/2011  . Total hip arthroplasty  01/26/2012    Procedure: TOTAL HIP ARTHROPLASTY ANTERIOR APPROACH;  Surgeon: Kathryne Hitch, MD;  Location: [facility];  Service: Orthopedics;  Laterality: Left;  Left Total Hip Arthroplasty, Anterior Approach (C-Arm)  . Intramedullary (im) nail intertrochanteric Right 04/12/2012    Procedure: INTRAMEDULLARY (IM) NAIL INTERTROCHANTRIC Right;  Surgeon: Kathryne Hitch, MD;  Location: [facility] OR;  Service: Orthopedics;  Laterality: Right;  . Doppler echocardiography  06/06/2012    EF 50-60%.  Mild LVH, grade one diastolic function; normal PA pressures and CVP  . Lower extremity venous dopplers  08/16/2012    Partially occlusive right femoral vein thrombosis, also noted in right peroneal vein; no thrombosis in left sided veins.  . Nm myoview ltd  April 2014    Bowel artifact, No ischemia or infarction  . Femoral-femoral bypass graft  1994    Following right iliac stenting  . Doppler echocardiography  06/06/2012    Mild LVH, EF 55-60%.  Normal wall motion.  Mildly elevated LVEDP with grade one diastolic function.  Mild LA dilatation.  Normal PA pressures.  . Tubal ligation  1956  . Excision/release bursa hip Right 12/27/2012    Procedure: RIGHT HIP BURSECTOMY AND COMPRESSION SCREW REMOVAL;  Surgeon: Kathryne Hitch, MD;  Location: [facility];  Service: Orthopedics;  Laterality: Right;  . Joint replacement    . Coronary angioplasty with stent placement  2004    RCA: Taxus DES 2.5 mm x 12 mm; Circumflex: Cypher DES 2.75 mm 14 mm  . Cardiac catheterization  October 2005    Patent RCA stent, 20% ISR of Cx stent; EF 50-55%    Lloyd Huger MS RD LDN Clinical Dietitian Pager:[phone]

## 2013-12-02 NOTE — Progress Notes (Signed)
I have seen and discussed VP procedure with the patient. She would like to think it over and talk about it with her granddaughter prior to a decision. She may want to take pain medication and see if the pain improved over time rather then a VP. IR PA will see patient 10/14 to check on her decision.    Pattricia Boss PA-C Interventional Radiology  12/02/13 3:48 PM

## 2013-12-02 NOTE — Progress Notes (Signed)
[facility] PROGRESS NOTE  [name] ZDG:[id number] DOB: 09-24-1930 DOA: 12/01/2013 PCP: Hoyle Sauer, MD  Assessment/Plan: Abdominal pain Symptoms likely appears to be muscular with tenderness around the rectus abdominus muscle Pain control with when necessary tramadol. Added baclofen for muscle spasm. And antiemetics  Recent T9 compression fracture Evaluated by IR and pending final recommendations. PT eval Continue with pain control Recommend INR to be less than 1.5. Patient has been on Coumadin for right leg DVT seen July 2014. We'll place on IV heparin. 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  Chest discomfort on admission Asymptomatic at present. Serial troponin negative. Will discontinue telemetry  Rheumatoid arthritis Continue home medications  Protein calorie malnutrition Nutrition supplements as recommended   Code Status: Full code Family Communication: None at bedside Disposition Plan: Pending IR and PT evaluation . possibly in 1-2 days   Consultants:  IR  Procedures:  None  Antibiotics:  None  HPI/Subjective: Patient seen and examined. Reports her abdominal pain to be slightly better.  Objective: Filed Vitals:   12/02/13 0710  BP: 169/70  Pulse:   Temp:   Resp:     Intake/Output Summary (Last 24 hours) at 12/02/13 1408 Last data filed at 12/02/13 0900  Gross per 24 hour  Intake    360 ml  Output      0 ml  Net    360 ml   Filed Weights   12/02/13 0525  Weight: 51.574 kg (113 lb 11.2 oz)    Exam:   General:  Elderly thin built female in no acute distress  HEENT: Moist oral mucosa  Chest: Clear to auscultation bilaterally  CVS: Normal S1 and S2, no murmurs  Abdomen: Tender to palpation over mid abdomen mainly along the right rectus abdominous muscle  Extremities: low  back tenderness,  no edema    Data Reviewed: Basic Metabolic  Panel:  Recent Labs Lab 12/01/13 1723 12/02/13 0640  NA 134* 135*  K 4.5 3.9  CL 96 99  CO2 25 25  GLUCOSE 104* 114*  BUN 8 5*  CREATININE 0.53 0.47*  CALCIUM 9.2 8.9   Liver Function Tests:  Recent Labs Lab 12/01/13 1723  AST 22  ALT 12  ALKPHOS 91  BILITOT 0.3  PROT 6.8  ALBUMIN 3.5    Recent Labs Lab 12/01/13 1730  LIPASE 24   No results found for this basename: AMMONIA,  in the last 168 hours CBC:  Recent Labs Lab 12/01/13 1723 12/02/13 0640  WBC 5.6 5.7  NEUTROABS 3.5  --   HGB 11.6* 11.2*  HCT 35.4* 33.9*  MCV 87.6 86.7  PLT 208 204   Cardiac Enzymes:  Recent Labs Lab 12/02/13 0640 12/02/13 0956  TROPONINI <0.30 <0.30   BNP (last 3 results) No results found for this basename: PROBNP,  in the last 8760 hours CBG: No results found for this basename: GLUCAP,  in the last 168 hours  No results found for this or any previous visit (from the past 240 hour(s)).   Studies: Ct Angio Chest Pe W/cm &/or Wo Cm  12/02/2013   CLINICAL DATA:  Abdominal pain. Left upper rib cage pain. Initial encounter.  EXAM: CT ANGIOGRAPHY CHEST  CT ABDOMEN AND PELVIS WITH CONTRAST  TECHNIQUE: Multidetector CT imaging of the chest was performed using the standard protocol during bolus administration of intravenous contrast. Multiplanar CT image reconstructions and MIPs were obtained to evaluate the  vascular anatomy. Multidetector CT imaging of the abdomen and pelvis was performed using the standard protocol during bolus administration of intravenous contrast.  CONTRAST:  OMNIPAQUE IOHEXOL 350 MG/ML SOLN  COMPARISON:  None.  FINDINGS: CTA CHEST FINDINGS  THORACIC INLET/BODY WALL:  15 mm thyroid nodule in the inferior right lobe. This is likely incidental based on size and patient age.  No acute findings.  MEDIASTINUM:  Normal heart size. No pericardial effusion. Diffuse atherosclerosis, including the coronary arteries. Status post coronary stenting. Negative for acute  aortic finding (as permitted by non opacification of the mid thoracic aorta). Negative for pulmonary embolism. Moderate sliding-type hiatal hernia without obstruction.  LUNG WINDOWS:  Subpleural reticulation which is likely chronic. There is no edema, pneumonia, effusion, or pneumothorax. No suspicious pulmonary nodules.  OSSEOUS:  There is a T9 compression fracture with height loss greater than 75%. Although an acute fracture line is not visible, there is surrounding relatively low-density perispinal fat infiltration - likely edema or hemorrhage. No definite bowing of the fracture, enhancing soft tissue, or sclerosis suggestive of pathologic fracture. No subluxation.  Remote T7 compression fracture status post bone augmentation.  Remote right-sided rib fractures.  Right glenohumeral arthroplasty with anterior subluxation of humeral prosthesis, commonly encounter.  CT ABDOMEN and PELVIS FINDINGS  BODY WALL: Unremarkable.  ABDOMEN/PELVIS:  Liver: No focal abnormality.  Biliary: Cholecystectomy.  Pancreas: Unremarkable.  Spleen: Granulomatous changes. Heterogeneous enhancement of the inferior spleen resolves on delayed imaging. No splenic mass.  Adrenals: Unremarkable.  Kidneys and ureters: No hydronephrosis or stone.  Bladder: Moderately distended.  Reproductive: Low pelvic floor. Status post hysterectomy. No adnexal mass.  Bowel: Moderate sliding-type hiatal hernia. No bowel obstruction. No pericecal inflammation.  Retroperitoneum: No mass or adenopathy.  Peritoneum: No ascites or pneumoperitoneum.  Vascular: Extensive atherosclerosis, status post aorto bi femoral bypass. The graft is patent. No acute vascular findings.  OSSEOUS: Left total hip arthroplasty. Remote proximal right femur ORIF. No adverse findings. Remote insufficiency fractures of the right sacral ala and inferior pubic ramus. No acute fracture identified. L4-5 laminotomy.  Review of the MIP images confirms the above findings.  IMPRESSION: 1. Recent  T9 compression fracture with severe height loss but no bony retropulsion. 2. Negative for pulmonary embolism. 3. No acute intra-abdominal findings.   Electronically Signed   By: Tiburcio Pea M.D.   On: 12/02/2013 01:23   Ct Abdomen Pelvis W Contrast  12/02/2013   CLINICAL DATA:  Abdominal pain. Left upper rib cage pain. Initial encounter.  EXAM: CT ANGIOGRAPHY CHEST  CT ABDOMEN AND PELVIS WITH CONTRAST  TECHNIQUE: Multidetector CT imaging of the chest was performed using the standard protocol during bolus administration of intravenous contrast. Multiplanar CT image reconstructions and MIPs were obtained to evaluate the vascular anatomy. Multidetector CT imaging of the abdomen and pelvis was performed using the standard protocol during bolus administration of intravenous contrast.  CONTRAST:  OMNIPAQUE IOHEXOL 350 MG/ML SOLN  COMPARISON:  None.  FINDINGS: CTA CHEST FINDINGS  THORACIC INLET/BODY WALL:  15 mm thyroid nodule in the inferior right lobe. This is likely incidental based on size and patient age.  No acute findings.  MEDIASTINUM:  Normal heart size. No pericardial effusion. Diffuse atherosclerosis, including the coronary arteries. Status post coronary stenting. Negative for acute aortic finding (as permitted by non opacification of the mid thoracic aorta). Negative for pulmonary embolism. Moderate sliding-type hiatal hernia without obstruction.  LUNG WINDOWS:  Subpleural reticulation which is likely chronic. There is no edema, pneumonia, effusion,  or pneumothorax. No suspicious pulmonary nodules.  OSSEOUS:  There is a T9 compression fracture with height loss greater than 75%. Although an acute fracture line is not visible, there is surrounding relatively low-density perispinal fat infiltration - likely edema or hemorrhage. No definite bowing of the fracture, enhancing soft tissue, or sclerosis suggestive of pathologic fracture. No subluxation.  Remote T7 compression fracture status post bone  augmentation.  Remote right-sided rib fractures.  Right glenohumeral arthroplasty with anterior subluxation of humeral prosthesis, commonly encounter.  CT ABDOMEN and PELVIS FINDINGS  BODY WALL: Unremarkable.  ABDOMEN/PELVIS:  Liver: No focal abnormality.  Biliary: Cholecystectomy.  Pancreas: Unremarkable.  Spleen: Granulomatous changes. Heterogeneous enhancement of the inferior spleen resolves on delayed imaging. No splenic mass.  Adrenals: Unremarkable.  Kidneys and ureters: No hydronephrosis or stone.  Bladder: Moderately distended.  Reproductive: Low pelvic floor. Status post hysterectomy. No adnexal mass.  Bowel: Moderate sliding-type hiatal hernia. No bowel obstruction. No pericecal inflammation.  Retroperitoneum: No mass or adenopathy.  Peritoneum: No ascites or pneumoperitoneum.  Vascular: Extensive atherosclerosis, status post aorto bi femoral bypass. The graft is patent. No acute vascular findings.  OSSEOUS: Left total hip arthroplasty. Remote proximal right femur ORIF. No adverse findings. Remote insufficiency fractures of the right sacral ala and inferior pubic ramus. No acute fracture identified. L4-5 laminotomy.  Review of the MIP images confirms the above findings.  IMPRESSION: 1. Recent T9 compression fracture with severe height loss but no bony retropulsion. 2. Negative for pulmonary embolism. 3. No acute intra-abdominal findings.   Electronically Signed   By: Tiburcio Pea M.D.   On: 12/02/2013 01:23    Scheduled Meds: . baclofen  10 mg Oral TID  . docusate sodium  100 mg Oral BID  . escitalopram  5 mg Oral QPM  . hydroxychloroquine  200 mg Oral BID  . isosorbide mononitrate  30 mg Oral q morning - 10a  . [START ON 12/03/2013] lactose free nutrition  237 mL Oral Q24H  . Linaclotide  290 mcg Oral q morning - 10a  . multivitamin with minerals  1 tablet Oral Q1200  . omeprazole  20 mg Oral q morning - 10a  . senna  1 tablet Oral BID  . sodium chloride  3 mL Intravenous Q12H  .  sulfaSALAzine  500 mg Oral TID  . warfarin  3 mg Oral Once per day on Sun Tue Thu Fri Sat  . [START ON 12/03/2013] warfarin  4.5 mg Oral Once per day on Mon Wed  . Warfarin - Pharmacist Dosing Inpatient   Does not apply q1800   Continuous Infusions: . sodium chloride 75 mL/hr at 12/02/13 0526      Time spent: 25 minutes    Kyngston Pickelsimer  [facility] Pager [phone]. If 7PM-7AM, please contact night-coverage at [email or web address], password [facility] 12/02/2013, 2:08 PM  LOS: 1 day

## 2013-12-02 NOTE — Progress Notes (Signed)
ANTICOAGULATION CONSULT NOTE - Initial Consult  Pharmacy Consult for warfarin Indication: DVT  Allergies  Allergen Reactions  . Codeine Nausea And Vomiting, Swelling and Rash  . Hydrocodone Nausea And Vomiting, Swelling and Rash    "mouth peeled"  . Morphine And Related Other (See Comments)    hallucinations  . Other Nausea And Vomiting    Most pain meds! other than tramadol. Tylenol, Toradol, and Robaxin are ok.    . Penicillins Hives  . Darvocet [Propoxyphene N-Acetaminophen] Hives and Nausea And Vomiting  . Demerol [Meperidine] Nausea And Vomiting  . Statins Other (See Comments)    INTOLERANT OF STATINS - messes up her legs and has muscle aches  . Tape Other (See Comments)    Needs paper tape - skin very fragile    Patient Measurements:   Heparin Dosing Weight:   Vital Signs: Temp: 97.8 F (36.6 C) (10/12 1701) Temp Source: Oral (10/12 1701) BP: 167/84 mmHg (10/13 0137) Pulse Rate: 81 (10/13 0137)  Labs:  Recent Labs  12/01/13 1723 12/01/13 2359  HGB 11.6*  --   HCT 35.4*  --   PLT 208  --   LABPROT  --  25.0*  INR  --  2.24*  CREATININE 0.53  --     The CrCl is unknown because both a height and weight (above a minimum accepted value) are required for this calculation.   Medical History: Past Medical History  Diagnosis Date  . History of  Angina     Treated with PCI in 2004; no active Symptoms.  . Hiatal hernia   . GERD (gastroesophageal reflux disease)   . Iron deficiency anemia   . Barrett esophagus   . Osteoporosis   . HLD (hyperlipidemia)     Statin intolerant  . Internal hemorrhoids   . Diverticulosis   . Pain     RIGHT SHOULDER WITH LIMITED ROM--STATES PREVIOUS RT TOTAL SHOULDDER REPLACEMENT-BUT STILL HAS PROBLEMS WITH SHOULDER  . CAD S/P percutaneous coronary angioplasty 2004    PCI-RCA (2.85mm x 69mm Taxus DES)& Cx (2.75 mm x 14 mm Cypher DES)  . PAD (peripheral artery disease) 1994    History of right iliac stenting, fem-fem bypass in  94; aortobifem bypass/ AAA repair - November 2004; latest ABIs of been roughly 1 bilaterally, followed by Dr. Arbie Cookey  . HTN (hypertension)     sometimes  . Shortness of breath     MILD- WITH EXERTION. chronic  . Nocturia   . DVT (deep venous thrombosis)     BLE  . COPD (chronic obstructive pulmonary disease)   . Pneumonia     "several times"  . History of blood transfusion     "not sure why"  . History of stomach ulcers   . Migraine     "used to have them; not anymore" (08/22/2013)  . Stroke     "showed up on XRAY" (08/22/2013)  . Osteoarthritis of both hips      status post multiple hip operations, recent fall with fracture.    [name] DJD (degenerative joint disease)   . Rheumatoid arthritis   . Fall from slip, trip, or stumble 08/20/2013    Medications:   (Not in a hospital admission) Scheduled:  . warfarin  3 mg Oral Once per day on Sun Tue Thu Fri Sat  . [START ON 12/03/2013] warfarin  4.5 mg Oral Once per day on Mon Wed  . Warfarin - Pharmacist Dosing Inpatient   Does not apply [phone]  Assessment: Patient with chronic warfarin for DVT.  INR at goal on admit.    Goal of Therapy:  INR 2-3    Plan:  Warfarin dosing based on home dosing prior to admission. Daily INR.  [name], [name] 12/02/2013,4:12 AM

## 2013-12-02 NOTE — Progress Notes (Signed)
ANTICOAGULATION CONSULT NOTE - Initial Consult  Pharmacy Consult for heparin Indication: VTE treatment  Allergies  Allergen Reactions  . Codeine Nausea And Vomiting, Swelling and Rash  . Hydrocodone Nausea And Vomiting, Swelling and Rash    "mouth peeled"  . Morphine And Related Other (See Comments)    hallucinations  . Other Nausea And Vomiting    Most pain meds! other than tramadol. Tylenol, Toradol, and Robaxin are ok.    . Penicillins Hives  . Darvocet [Propoxyphene N-Acetaminophen] Hives and Nausea And Vomiting  . Demerol [Meperidine] Nausea And Vomiting  . Statins Other (See Comments)    INTOLERANT OF STATINS - messes up her legs and has muscle aches  . Tape Other (See Comments)    Needs paper tape - skin very fragile    Patient Measurements: Height: 5\' 3"  (160 cm) Weight: 113 lb 11.2 oz (51.574 kg) IBW/kg (Calculated) : 52.4   Vital Signs: Temp: 98.3 F (36.8 C) (10/13 2034) Temp Source: Oral (10/13 2034) BP: 146/63 mmHg (10/13 2034) Pulse Rate: 72 (10/13 2034)  Labs:  Recent Labs  12/01/13 1723 12/01/13 2359 12/02/13 0640 12/02/13 0956 12/02/13 1555 12/02/13 2104  HGB 11.6*  --  11.2*  --   --   --   HCT 35.4*  --  33.9*  --   --   --   PLT 208  --  204  --   --   --   LABPROT  --  25.0* 23.6*  --   --  22.7*  INR  --  2.24* 2.09*  --   --  1.98*  CREATININE 0.53  --  0.47*  --   --   --   TROPONINI  --   --  <0.30 <0.30 <0.30  --     Estimated Creatinine Clearance: 44.9 ml/min (by C-G formula based on Cr of 0.47).   Medical History: Past Medical History  Diagnosis Date  . History of  Angina     Treated with PCI in 2004; no active Symptoms.  . Hiatal hernia   . GERD (gastroesophageal reflux disease)   . Iron deficiency anemia   . Barrett esophagus   . Osteoporosis   . HLD (hyperlipidemia)     Statin intolerant  . Internal hemorrhoids   . Diverticulosis   . Pain     RIGHT SHOULDER WITH LIMITED ROM--STATES PREVIOUS RT TOTAL SHOULDDER  REPLACEMENT-BUT STILL HAS PROBLEMS WITH SHOULDER  . CAD S/P percutaneous coronary angioplasty 2004    PCI-RCA (2.110mm x 57mm Taxus DES)& Cx (2.75 mm x 14 mm Cypher DES)  . PAD (peripheral artery disease) 1994    History of right iliac stenting, fem-fem bypass in 94; aortobifem bypass/ AAA repair - November 2004; latest ABIs of been roughly 1 bilaterally, followed by Dr. December 2004  . HTN (hypertension)     sometimes  . Shortness of breath     MILD- WITH EXERTION. chronic  . Nocturia   . DVT (deep venous thrombosis)     BLE  . COPD (chronic obstructive pulmonary disease)   . Pneumonia     "several times"  . History of blood transfusion     "not sure why"  . History of stomach ulcers   . Migraine     "used to have them; not anymore" (08/22/2013)  . Stroke     "showed up on XRAY" (08/22/2013)  . Osteoarthritis of both hips      status post multiple hip operations, recent fall with  fracture.    [name] DJD (degenerative joint disease)   . Rheumatoid arthritis   . Fall from slip, trip, or stumble 08/20/2013    Medications:  Scheduled:  . baclofen  10 mg Oral TID  . docusate sodium  100 mg Oral BID  . escitalopram  5 mg Oral QPM  . heparin  1,500 Units Intravenous Once  . hydroxychloroquine  200 mg Oral BID  . isosorbide mononitrate  30 mg Oral q morning - 10a  . [START ON 12/03/2013] lactose free nutrition  237 mL Oral Q24H  . Linaclotide  290 mcg Oral q morning - 10a  . multivitamin with minerals  1 tablet Oral Q1200  . omeprazole  20 mg Oral q morning - 10a  . senna  1 tablet Oral BID  . sodium chloride  3 mL Intravenous Q12H  . sulfaSALAzine  500 mg Oral TID   Infusions:  . sodium chloride 75 mL/hr at 12/02/13 0526  . heparin      Assessment: HPI:  80 y/o F with PMhx of angina, iron deficiency, HTN, HLD, COPD, DVT on coumadin, GERD, stroke, RA, OA, DJD presented to [facility] ED with main concern of several days duration of persistent abd discomfort, associated with cramping, poor oral  intake, 5/10 in severity, with no specific alleviating or aggravating factors.  Pharmacy consulted to start heparin when INR <2.0  Goal of Therapy:  Heparin level 0.3-0.7 units/ml :Monitor platelets by anticoagulation protocol:yes   Plan:   Heparin bolus 1500 units then 800 units/hour  Check heparin level in 8 hours  Daily CBC  Arley Phenix RPh 12/02/2013, 10:04 PM Pager [phone]

## 2013-12-02 NOTE — Progress Notes (Signed)
IR PA aware of request for VP for T9 compression fracture, images reviewed by Dr. Corliss Skains and approved for VP. IR PA to see and evaluate patient's severity and location of pain in order to be candidate for VP. INR will need to be < 1.5 for procedure and insurance approval for procedure is pending.   Pattricia Boss PA-C Interventional Radiology  12/02/13  9:27 AM

## 2013-12-02 NOTE — H&P (Signed)
[facility] History and Physical  [name] YIF:[id number] DOB: [birth year] DOA: 12/01/2013  Referring physician: ED physician PCP: Tivis Ringer, MD   Chief Complaint: abd pain   HPI:  80 y/o F with PMhx of angina, iron deficiency, HTN, HLD, COPD, DVT on coumadin, GERD, stroke, RA, OA, DJD presented to [facility] ED with main concern of several days duration of persistent abd discomfort, associated with cramping, poor oral intake, 5/10 in severity, with no specific alleviating or aggravating factors. Pt reports she saw Dr. Karolee Ohs earlier prior to this admission who recommended going to ED due to intensity of the pain. Pt explains that tramadol and robaxin are not helping. Pt reports she was last hospitalized in July 2015 regarding a fall that resulted in bilateral rib fractures and pubic fracture.  PT reports no fevers, no chills, no urinary concerns. She did have some chest discomfort earlier but says this is now gone.   In ED, pt noted to be hemodynamically stable, with no clear source of abd pain. [facility] asked to admit for further evaluation.   Assessment and Plan: Active Problems: Abd pain - unclear etiology, ? From constipation - will place on bowel regimen - provide analgesia and antiemetics as needed Recent T9 compression fracture - IR consult for consideration of kyphoplasty  - PT evaluation once pt able to tolerate  Chest discomfort - monitor on tele - no chest pain at this time - cycle CE's  Underweight, BMI < 19 PCM, moderate  - advance diet as pt able to tolerate  RA - continue home medical regimen History of DVT - coumadin per pharmacy    Radiological Exams on Admission: Ct Angio Chest Pe W/cm &/or Wo Cm  12/02/2013   CLINICAL DATA:  Abdominal pain. Left upper rib cage pain. Initial encounter.  EXAM: CT ANGIOGRAPHY CHEST  CT ABDOMEN AND PELVIS WITH CONTRAST  TECHNIQUE: Multidetector CT imaging of the chest was performed using the standard protocol during  bolus administration of intravenous contrast. Multiplanar CT image reconstructions and MIPs were obtained to evaluate the vascular anatomy. Multidetector CT imaging of the abdomen and pelvis was performed using the standard protocol during bolus administration of intravenous contrast.  CONTRAST:  187m OMNIPAQUE IOHEXOL 350 MG/ML SOLN  COMPARISON:  None.  FINDINGS: CTA CHEST FINDINGS  THORACIC INLET/BODY WALL:  15 mm thyroid nodule in the inferior right lobe. This is likely incidental based on size and patient age.  No acute findings.  MEDIASTINUM:  Normal heart size. No pericardial effusion. Diffuse atherosclerosis, including the coronary arteries. Status post coronary stenting. Negative for acute aortic finding (as permitted by non opacification of the mid thoracic aorta). Negative for pulmonary embolism. Moderate sliding-type hiatal hernia without obstruction.  LUNG WINDOWS:  Subpleural reticulation which is likely chronic. There is no edema, pneumonia, effusion, or pneumothorax. No suspicious pulmonary nodules.  OSSEOUS:  There is a T9 compression fracture with height loss greater than 75%. Although an acute fracture line is not visible, there is surrounding relatively low-density perispinal fat infiltration - likely edema or hemorrhage. No definite bowing of the fracture, enhancing soft tissue, or sclerosis suggestive of pathologic fracture. No subluxation.  Remote T7 compression fracture status post bone augmentation.  Remote right-sided rib fractures.  Right glenohumeral arthroplasty with anterior subluxation of humeral prosthesis, commonly encounter.  CT ABDOMEN and PELVIS FINDINGS  BODY WALL: Unremarkable.  ABDOMEN/PELVIS:  Liver: No focal abnormality.  Biliary: Cholecystectomy.  Pancreas: Unremarkable.  Spleen: Granulomatous changes. Heterogeneous enhancement of the inferior spleen resolves  on delayed imaging. No splenic mass.  Adrenals: Unremarkable.  Kidneys and ureters: No hydronephrosis or stone.   Bladder: Moderately distended.  Reproductive: Low pelvic floor. Status post hysterectomy. No adnexal mass.  Bowel: Moderate sliding-type hiatal hernia. No bowel obstruction. No pericecal inflammation.  Retroperitoneum: No mass or adenopathy.  Peritoneum: No ascites or pneumoperitoneum.  Vascular: Extensive atherosclerosis, status post aorto bi femoral bypass. The graft is patent. No acute vascular findings.  OSSEOUS: Left total hip arthroplasty. Remote proximal right femur ORIF. No adverse findings. Remote insufficiency fractures of the right sacral ala and inferior pubic ramus. No acute fracture identified. L4-5 laminotomy.  Review of the MIP images confirms the above findings.  IMPRESSION: 1. Recent T9 compression fracture with severe height loss but no bony retropulsion. 2. Negative for pulmonary embolism. 3. No acute intra-abdominal findings.   Electronically Signed   By: Jorje Guild M.D.   On: 12/02/2013 01:23   Ct Abdomen Pelvis W Contrast  12/02/2013   CLINICAL DATA:  Abdominal pain. Left upper rib cage pain. Initial encounter.  EXAM: CT ANGIOGRAPHY CHEST  CT ABDOMEN AND PELVIS WITH CONTRAST  TECHNIQUE: Multidetector CT imaging of the chest was performed using the standard protocol during bolus administration of intravenous contrast. Multiplanar CT image reconstructions and MIPs were obtained to evaluate the vascular anatomy. Multidetector CT imaging of the abdomen and pelvis was performed using the standard protocol during bolus administration of intravenous contrast.  CONTRAST:  18m OMNIPAQUE IOHEXOL 350 MG/ML SOLN  COMPARISON:  None.  FINDINGS: CTA CHEST FINDINGS  THORACIC INLET/BODY WALL:  15 mm thyroid nodule in the inferior right lobe. This is likely incidental based on size and patient age.  No acute findings.  MEDIASTINUM:  Normal heart size. No pericardial effusion. Diffuse atherosclerosis, including the coronary arteries. Status post coronary stenting. Negative for acute aortic finding (as  permitted by non opacification of the mid thoracic aorta). Negative for pulmonary embolism. Moderate sliding-type hiatal hernia without obstruction.  LUNG WINDOWS:  Subpleural reticulation which is likely chronic. There is no edema, pneumonia, effusion, or pneumothorax. No suspicious pulmonary nodules.  OSSEOUS:  There is a T9 compression fracture with height loss greater than 75%. Although an acute fracture line is not visible, there is surrounding relatively low-density perispinal fat infiltration - likely edema or hemorrhage. No definite bowing of the fracture, enhancing soft tissue, or sclerosis suggestive of pathologic fracture. No subluxation.  Remote T7 compression fracture status post bone augmentation.  Remote right-sided rib fractures.  Right glenohumeral arthroplasty with anterior subluxation of humeral prosthesis, commonly encounter.  CT ABDOMEN and PELVIS FINDINGS  BODY WALL: Unremarkable.  ABDOMEN/PELVIS:  Liver: No focal abnormality.  Biliary: Cholecystectomy.  Pancreas: Unremarkable.  Spleen: Granulomatous changes. Heterogeneous enhancement of the inferior spleen resolves on delayed imaging. No splenic mass.  Adrenals: Unremarkable.  Kidneys and ureters: No hydronephrosis or stone.  Bladder: Moderately distended.  Reproductive: Low pelvic floor. Status post hysterectomy. No adnexal mass.  Bowel: Moderate sliding-type hiatal hernia. No bowel obstruction. No pericecal inflammation.  Retroperitoneum: No mass or adenopathy.  Peritoneum: No ascites or pneumoperitoneum.  Vascular: Extensive atherosclerosis, status post aorto bi femoral bypass. The graft is patent. No acute vascular findings.  OSSEOUS: Left total hip arthroplasty. Remote proximal right femur ORIF. No adverse findings. Remote insufficiency fractures of the right sacral ala and inferior pubic ramus. No acute fracture identified. L4-5 laminotomy.  Review of the MIP images confirms the above findings.  IMPRESSION: 1. Recent T9 compression  fracture with severe height loss  but no bony retropulsion. 2. Negative for pulmonary embolism. 3. No acute intra-abdominal findings.   Electronically Signed   By: Jorje Guild M.D.   On: 12/02/2013 01:23     Code Status: Full Family Communication: Pt at bedside Disposition Plan: Admit for further evaluation     Review of Systems:  Constitutional: Negative for diaphoresis.  HENT: Negative for hearing loss, ear pain, nosebleeds, congestion, sore throat, neck pain, tinnitus and ear discharge.   Eyes: Negative for blurred vision, double vision, photophobia, pain, discharge and redness.  Respiratory: Negative for cough, hemoptysis, shortness of breath, wheezing and stridor.   Cardiovascular: Negative for palpitations, orthopnea, claudication and leg swelling.  Gastrointestinal: Negative for heartburn, constipation, blood in stool and melena.  Genitourinary: Negative for dysuria, urgency, frequency, hematuria and flank pain.  Musculoskeletal: Negative for falls.  Skin: Negative for itching and rash.  Neurological: Negative for dizziness and weakness. Endo/Heme/Allergies: Negative for environmental allergies and polydipsia. Does not bruise/bleed easily.  Psychiatric/Behavioral: Negative for suicidal ideas. The patient is not nervous/anxious.      Past Medical History  Diagnosis Date  . History of  Angina     Treated with PCI in 2004; no active Symptoms.  . Hiatal hernia   . GERD (gastroesophageal reflux disease)   . Iron deficiency anemia   . Barrett esophagus   . Osteoporosis   . HLD (hyperlipidemia)     Statin intolerant  . Internal hemorrhoids   . Diverticulosis   . Pain     RIGHT SHOULDER WITH LIMITED ROM--STATES PREVIOUS RT TOTAL SHOULDDER REPLACEMENT-BUT STILL HAS PROBLEMS WITH SHOULDER  . CAD S/P percutaneous coronary angioplasty 2004    PCI-RCA (2.73m x 117mTaxus DES)& Cx (2.75 mm x 14 mm Cypher DES)  . PAD (peripheral artery disease) 1994    History of right iliac  stenting, fem-fem bypass in 94; aortobifem bypass/ AAA repair - November 2004; latest ABIs of been roughly 1 bilaterally, followed by Dr. EaDonnetta Hutching. HTN (hypertension)     sometimes  . Shortness of breath     MILD- WITH EXERTION. chronic  . Nocturia   . DVT (deep venous thrombosis)     BLE  . COPD (chronic obstructive pulmonary disease)   . Pneumonia     "several times"  . History of blood transfusion     "not sure why"  . History of stomach ulcers   . Migraine     "used to have them; not anymore" (08/22/2013)  . Stroke     "showed up on XRAY" (08/22/2013)  . Osteoarthritis of both hips      status post multiple hip operations, recent fall with fracture.    . Marland KitchenJD (degenerative joint disease)   . Rheumatoid arthritis   . Fall from slip, trip, or stumble 08/20/2013    Past Surgical History  Procedure Laterality Date  . Back surgery      x 2  . Appendectomy  1950  . Total shoulder replacement Right 2011  . Cholecystectomy      several yrs ago  . Cataract extraction w/ intraocular lens  implant, bilateral Bilateral 2001-2002  . Total abdominal hysterectomy  1965  . Upper gastrointestinal endoscopy  multiple    w/bx, hiatal hernia, Barretts', esophageal polyp 1x  . Colonoscopy  05/09/2004; 06/08/1999    internal hemorrhoids; diverticulosis  . Shoulder open rotator cuff repair Bilateral     2 times on left  . Lumbar spine surgery  October 2012  spinal stenosis, Dr. Lorin Mercy  . Abdominal aortic aneurysm repair  2004    AortoBi-Fem Bypass  . Carpal tunnel release Left 04/2011  . Total hip arthroplasty  01/26/2012    Procedure: TOTAL HIP ARTHROPLASTY ANTERIOR APPROACH;  Surgeon: Mcarthur Rossetti, MD;  Location: [facility];  Service: Orthopedics;  Laterality: Left;  Left Total Hip Arthroplasty, Anterior Approach (C-Arm)  . Intramedullary (im) nail intertrochanteric Right 04/12/2012    Procedure: INTRAMEDULLARY (IM) NAIL INTERTROCHANTRIC Right;  Surgeon: Mcarthur Rossetti, MD;   Location: [place];  Service: Orthopedics;  Laterality: Right;  . Doppler echocardiography  06/06/2012    EF 50-60%.  Mild LVH, grade one diastolic function; normal PA pressures and CVP  . Lower extremity venous dopplers  08/16/2012    Partially occlusive right femoral vein thrombosis, also noted in right peroneal vein; no thrombosis in left sided veins.  . Nm myoview ltd  April 2014    Bowel artifact, No ischemia or infarction  . Femoral-femoral bypass graft  1994    Following right iliac stenting  . Doppler echocardiography  06/06/2012    Mild LVH, EF 55-60%.  Normal wall motion.  Mildly elevated LVEDP with grade one diastolic function.  Mild LA dilatation.  Normal PA pressures.  . Tubal ligation  1956  . Excision/release bursa hip Right 12/27/2012    Procedure: RIGHT HIP BURSECTOMY AND COMPRESSION SCREW REMOVAL;  Surgeon: Mcarthur Rossetti, MD;  Location: [facility];  Service: Orthopedics;  Laterality: Right;  . Joint replacement    . Coronary angioplasty with stent placement  2004    RCA: Taxus DES 2.5 mm x 12 mm; Circumflex: Cypher DES 2.75 mm 14 mm  . Cardiac catheterization  October 2005    Patent RCA stent, 20% ISR of Cx stent; EF 50-55%    Social History:  reports that she quit smoking about 13 years ago. Her smoking use included Cigarettes. She has a 50 pack-year smoking history. She has never used smokeless tobacco. She reports that she does not drink alcohol or use illicit drugs.  Allergies  Allergen Reactions  . Codeine Nausea And Vomiting, Swelling and Rash  . Hydrocodone Nausea And Vomiting, Swelling and Rash    "mouth peeled"  . Morphine And Related Other (See Comments)    hallucinations  . Other Nausea And Vomiting    Most pain meds! other than tramadol. Tylenol, Toradol, and Robaxin are ok.    . Penicillins Hives  . Darvocet [Propoxyphene N-Acetaminophen] Hives and Nausea And Vomiting  . Demerol [Meperidine] Nausea And Vomiting  . Statins Other (See Comments)     INTOLERANT OF STATINS - messes up her legs and has muscle aches  . Tape Other (See Comments)    Needs paper tape - skin very fragile    Family History  Problem Relation Age of Onset  . Prostate cancer Father   . Diabetes Mother   . Colon cancer Neg Hx   . Breast cancer Mother   . Heart disease Mother     Prior to Admission medications   Medication Sig Start Date End Date Taking? Authorizing Provider  Calcium Carbonate-Vitamin D (CALTRATE 600+D) 600-400 MG-UNIT per tablet Take 1 tablet by mouth every evening.    Yes Historical Provider, MD  Camphor-Menthol-Methyl Sal (SALONPAS) 1.2-5.7-6.3 % PTCH Place 1 patch onto the skin daily as needed (For pain.).   Yes Historical Provider, MD  cholecalciferol (VITAMIN D) 1000 UNITS tablet Take 2,000 Units by mouth every morning.   Yes Historical  Provider, MD  docusate sodium (COLACE) 100 MG capsule Take 100 mg by mouth 2 (two) times daily.    Yes Historical Provider, MD  escitalopram (LEXAPRO) 5 MG tablet Take 5 mg by mouth every evening.    Yes Historical Provider, MD  Flaxseed, Linseed, (FLAX SEED OIL PO) Take 1 tablet by mouth daily at 12 noon.    Yes Historical Provider, MD  hydroxychloroquine (PLAQUENIL) 200 MG tablet Take 200 mg by mouth 2 (two) times daily.    Yes Historical Provider, MD  isosorbide mononitrate (IMDUR) 30 MG 24 hr tablet Take 30 mg by mouth every morning.   Yes Historical Provider, MD  Linaclotide Rolan Lipa) 145 MCG CAPS capsule Take 290 mcg by mouth every morning.    Yes Historical Provider, MD  magnesium citrate SOLN Take 1 Bottle by mouth once.   Yes Historical Provider, MD  methocarbamol (ROBAXIN) 500 MG tablet Take 1 tablet (500 mg total) by mouth 3 (three) times daily as needed (For muscle spasms.). 01/01/13  Yes Erskine Emery, PA-C  Multiple Vitamin (MULTIVITAMIN WITH MINERALS) TABS tablet Take 1 tablet by mouth daily at 12 noon.   Yes Historical Provider, MD  omeprazole (PRILOSEC OTC) 20 MG tablet Take 20 mg by mouth  every morning.    Yes Historical Provider, MD  polyvinyl alcohol (LIQUIFILM TEARS) 1.4 % ophthalmic solution Place 1 drop into both eyes as needed (Dry eyes.). 04/15/12  Yes Srikar Janna Arch, MD  promethazine (PHENERGAN) 12.5 MG tablet Take 12.5 mg by mouth every 6 (six) hours as needed for nausea (Patient takes with Tramadol).   Yes Historical Provider, MD  sulfaSALAzine (AZULFIDINE) 500 MG tablet Take 500 mg by mouth 3 (three) times daily.    Yes Historical Provider, MD  traMADol (ULTRAM) 50 MG tablet Take 2 tablets (100 mg total) by mouth every 6 (six) hours as needed for moderate pain. 08/23/13  Yes Thurnell Lose, MD  warfarin (COUMADIN) 3 MG tablet Take 1 tablet (3 mg total) by mouth See admin instructions. States takes Monday, Wednesday 4.5 mg Tuesday, Thursday Friday Sat Sun 3 mg. Get your INR checked by PCP in 1 day. 08/26/13  Yes Thurnell Lose, MD  glucose monitoring kit (FREESTYLE) monitoring kit 1 each by Does not apply route 4 (four) times daily - after meals and at bedtime. 1 month Diabetic Testing Supplies for QAC-QHS accuchecks.Any brand OK 08/23/13   Thurnell Lose, MD    Physical Exam: Filed Vitals:   12/01/13 1701 12/01/13 2011 12/01/13 2135 12/02/13 0137  BP: 134/72 166/71 162/61 167/84  Pulse: 77 77  81  Temp: 97.8 F (36.6 C)     TempSrc: Oral     Resp: 18 19 22 20   SpO2: 100% 99% 100% 92%    Physical Exam  Constitutional: Appears well-developed and well-nourished. No distress.  HENT: Normocephalic. External right and left ear normal. Dry MM Eyes: Conjunctivae and EOM are normal. PERRLA, no scleral icterus.  Neck: Normal ROM. Neck supple. No JVD. No tracheal deviation. No thyromegaly.  CVS: RRR, S1/S2 +,no gallops, no carotid bruit.  Pulmonary: Effort and breath sounds normal, no stridor, rhonchi, wheezes, rales.  Abdominal: Soft. BS +,  no distension, tenderness in epigastric area, no rebound or guarding.  Musculoskeletal: Normal range of motion. No edema and no  tenderness.  Lymphadenopathy: No lymphadenopathy noted, cervical, inguinal. Neuro: Alert. Normal reflexes, muscle tone coordination. No cranial nerve deficit. Skin: Skin is warm and dry. No rash noted. Not diaphoretic. No erythema.  No pallor.  Psychiatric: Normal mood and affect. Behavior, judgment, thought content normal.   Labs on Admission:  Basic Metabolic Panel:  Recent Labs Lab 12/01/13 1723  NA 134*  K 4.5  CL 96  CO2 25  GLUCOSE 104*  BUN 8  CREATININE 0.53  CALCIUM 9.2   Liver Function Tests:  Recent Labs Lab 12/01/13 1723  AST 22  ALT 12  ALKPHOS 91  BILITOT 0.3  PROT 6.8  ALBUMIN 3.5    Recent Labs Lab 12/01/13 1730  LIPASE 24   No results found for this basename: AMMONIA,  in the last 168 hours CBC:  Recent Labs Lab 12/01/13 1723  WBC 5.6  NEUTROABS 3.5  HGB 11.6*  HCT 35.4*  MCV 87.6  PLT 208    EKG: Normal sinus rhythm, no ST/T wave changes  Faye Ramsay, MD  [facility] Pager [phone]  If 7PM-7AM, please contact night-coverage [email or web address] Password [email or web address] 12/02/2013, 3:42 AM

## 2013-12-02 NOTE — ED Notes (Signed)
Pt walked using walker w/o complication.

## 2013-12-03 ENCOUNTER — Encounter (HOSPITAL_COMMUNITY): Payer: Self-pay | Admitting: Radiology

## 2013-12-03 LAB — PROTIME-INR
INR: 1.9 — ABNORMAL HIGH (ref 0.00–1.49)
Prothrombin Time: 22 seconds — ABNORMAL HIGH (ref 11.6–15.2)

## 2013-12-03 LAB — HEPARIN LEVEL (UNFRACTIONATED)
HEPARIN UNFRACTIONATED: 0.27 [IU]/mL — AB (ref 0.30–0.70)
Heparin Unfractionated: 0.14 IU/mL — ABNORMAL LOW (ref 0.30–0.70)
Heparin Unfractionated: 0.22 IU/mL — ABNORMAL LOW (ref 0.30–0.70)

## 2013-12-03 LAB — CBC
HCT: 30.7 % — ABNORMAL LOW (ref 36.0–46.0)
Hemoglobin: 10.2 g/dL — ABNORMAL LOW (ref 12.0–15.0)
MCH: 29.2 pg (ref 26.0–34.0)
MCHC: 33.2 g/dL (ref 30.0–36.0)
MCV: 88 fL (ref 78.0–100.0)
Platelets: 172 10*3/uL (ref 150–400)
RBC: 3.49 MIL/uL — ABNORMAL LOW (ref 3.87–5.11)
RDW: 14.1 % (ref 11.5–15.5)
WBC: 4.4 10*3/uL (ref 4.0–10.5)

## 2013-12-03 LAB — URINE CULTURE

## 2013-12-03 MED ORDER — IPRATROPIUM-ALBUTEROL 0.5-2.5 (3) MG/3ML IN SOLN
3.0000 mL | Freq: Four times a day (QID) | RESPIRATORY_TRACT | Status: DC
  Administered 2013-12-03: 3 mL via RESPIRATORY_TRACT
  Filled 2013-12-03: qty 3

## 2013-12-03 MED ORDER — IPRATROPIUM-ALBUTEROL 0.5-2.5 (3) MG/3ML IN SOLN
3.0000 mL | Freq: Three times a day (TID) | RESPIRATORY_TRACT | Status: DC
  Administered 2013-12-03 – 2013-12-10 (×16): 3 mL via RESPIRATORY_TRACT
  Filled 2013-12-03 (×20): qty 3

## 2013-12-03 MED ORDER — ALBUTEROL SULFATE (2.5 MG/3ML) 0.083% IN NEBU: 2.5000 mg | INHALATION_SOLUTION | RESPIRATORY_TRACT | Status: DC | PRN

## 2013-12-03 MED ORDER — HEPARIN (PORCINE) IN NACL 100-0.45 UNIT/ML-% IJ SOLN
1000.0000 [IU]/h | INTRAMUSCULAR | Status: DC
  Administered 2013-12-03: 1000 [IU]/h via INTRAVENOUS
  Filled 2013-12-03: qty 250

## 2013-12-03 MED ORDER — LIDOCAINE 5 % EX PTCH
2.0000 | MEDICATED_PATCH | CUTANEOUS | Status: DC
  Administered 2013-12-03 – 2013-12-06 (×4): 2 via TRANSDERMAL
  Filled 2013-12-03 (×5): qty 2

## 2013-12-03 MED ORDER — HEPARIN (PORCINE) IN NACL 100-0.45 UNIT/ML-% IJ SOLN
900.0000 [IU]/h | INTRAMUSCULAR | Status: DC
  Administered 2013-12-03: 900 [IU]/h via INTRAVENOUS
  Filled 2013-12-03 (×2): qty 250

## 2013-12-03 NOTE — Progress Notes (Signed)
[facility] PROGRESS NOTE  [name] DQQ:[id number] DOB: Jun 29, 1930 DOA: 12/01/2013 PCP: Hoyle Sauer, MD  Assessment/Plan: Abdominal pain -Symptoms likely appears to be muscular with tenderness around the rectus abdominus muscle -Pain control with when necessary tramadol. Added baclofen for muscle spasm. -And antiemetics -She still appears to be in significant pain, we'll add them patch, and warm compress, may need IV pain medication if continues to have significant pain.  Recent T9 compression fracture Evaluated by IR and being planned for vertebroplasty 10/16. PT eval Continue with pain control Recommend INR to be less than 1.5. Patient has been on Coumadin for right leg DVT seen July 2014. We'll place on IV heparin. 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  Chest discomfort on admission Asymptomatic at present. Serial troponin negative. Will discontinue telemetry  Rheumatoid arthritis Continue home medications  Protein calorie malnutrition Nutrition supplements as recommended   Code Status: Full code Family Communication: None at bedside Disposition Plan: Pending IR and PT evaluation . possibly in 1-2 days   Consultants:  IR  Procedures:  None  Antibiotics:  None  HPI/Subjective: Patient seen and examined. Reports her abdominal pain to be slightly better.  Objective: Filed Vitals:   12/03/13 1426  BP: 148/73  Pulse: 73  Temp: 98 F (36.7 C)  Resp: 20    Intake/Output Summary (Last 24 hours) at 12/03/13 1634 Last data filed at 12/03/13 1600  Gross per 24 hour  Intake 1760.35 ml  Output   1100 ml  Net 660.35 ml   Filed Weights   12/02/13 0525 12/03/13 0436  Weight: 51.574 kg (113 lb 11.2 oz) 53.162 kg (117 lb 3.2 oz)    Exam:   General:  Elderly thin built female in no acute distress  HEENT: Moist oral mucosa  Chest: Clear to auscultation  bilaterally  CVS: Normal S1 and S2, no murmurs  Abdomen: Tender to palpation over mid abdomen mainly along the right rectus abdominous muscle  Extremities: low  back tenderness,  no edema    Data Reviewed: Basic Metabolic Panel:  Recent Labs Lab 12/01/13 1723 12/02/13 0640  NA 134* 135*  K 4.5 3.9  CL 96 99  CO2 25 25  GLUCOSE 104* 114*  BUN 8 5*  CREATININE 0.53 0.47*  CALCIUM 9.2 8.9   Liver Function Tests:  Recent Labs Lab 12/01/13 1723  AST 22  ALT 12  ALKPHOS 91  BILITOT 0.3  PROT 6.8  ALBUMIN 3.5    Recent Labs Lab 12/01/13 1730  LIPASE 24   No results found for this basename: AMMONIA,  in the last 168 hours CBC:  Recent Labs Lab 12/01/13 1723 12/02/13 0640 12/03/13 0540  WBC 5.6 5.7 4.4  NEUTROABS 3.5  --   --   HGB 11.6* 11.2* 10.2*  HCT 35.4* 33.9* 30.7*  MCV 87.6 86.7 88.0  PLT 208 204 172   Cardiac Enzymes:  Recent Labs Lab 12/02/13 0640 12/02/13 0956 12/02/13 1555  TROPONINI <0.30 <0.30 <0.30   BNP (last 3 results) No results found for this basename: PROBNP,  in the last 8760 hours CBG: No results found for this basename: GLUCAP,  in the last 168 hours  No results found for this or any previous visit (from the past 240 hour(s)).   Studies: Ct Angio Chest Pe W/cm &/or Wo Cm  12/02/2013   CLINICAL DATA:  Abdominal pain. Left upper rib cage pain. Initial encounter.  EXAM: CT ANGIOGRAPHY CHEST  CT ABDOMEN AND PELVIS WITH CONTRAST  TECHNIQUE: Multidetector CT imaging of the chest was performed using the standard protocol during bolus administration of intravenous contrast. Multiplanar CT image reconstructions and MIPs were obtained to evaluate the vascular anatomy. Multidetector CT imaging of the abdomen and pelvis was performed using the standard protocol during bolus administration of intravenous contrast.  CONTRAST:  OMNIPAQUE IOHEXOL 350 MG/ML SOLN  COMPARISON:  None.  FINDINGS: CTA CHEST FINDINGS  THORACIC INLET/BODY  WALL:  15 mm thyroid nodule in the inferior right lobe. This is likely incidental based on size and patient age.  No acute findings.  MEDIASTINUM:  Normal heart size. No pericardial effusion. Diffuse atherosclerosis, including the coronary arteries. Status post coronary stenting. Negative for acute aortic finding (as permitted by non opacification of the mid thoracic aorta). Negative for pulmonary embolism. Moderate sliding-type hiatal hernia without obstruction.  LUNG WINDOWS:  Subpleural reticulation which is likely chronic. There is no edema, pneumonia, effusion, or pneumothorax. No suspicious pulmonary nodules.  OSSEOUS:  There is a T9 compression fracture with height loss greater than 75%. Although an acute fracture line is not visible, there is surrounding relatively low-density perispinal fat infiltration - likely edema or hemorrhage. No definite bowing of the fracture, enhancing soft tissue, or sclerosis suggestive of pathologic fracture. No subluxation.  Remote T7 compression fracture status post bone augmentation.  Remote right-sided rib fractures.  Right glenohumeral arthroplasty with anterior subluxation of humeral prosthesis, commonly encounter.  CT ABDOMEN and PELVIS FINDINGS  BODY WALL: Unremarkable.  ABDOMEN/PELVIS:  Liver: No focal abnormality.  Biliary: Cholecystectomy.  Pancreas: Unremarkable.  Spleen: Granulomatous changes. Heterogeneous enhancement of the inferior spleen resolves on delayed imaging. No splenic mass.  Adrenals: Unremarkable.  Kidneys and ureters: No hydronephrosis or stone.  Bladder: Moderately distended.  Reproductive: Low pelvic floor. Status post hysterectomy. No adnexal mass.  Bowel: Moderate sliding-type hiatal hernia. No bowel obstruction. No pericecal inflammation.  Retroperitoneum: No mass or adenopathy.  Peritoneum: No ascites or pneumoperitoneum.  Vascular: Extensive atherosclerosis, status post aorto bi femoral bypass. The graft is patent. No acute vascular findings.   OSSEOUS: Left total hip arthroplasty. Remote proximal right femur ORIF. No adverse findings. Remote insufficiency fractures of the right sacral ala and inferior pubic ramus. No acute fracture identified. L4-5 laminotomy.  Review of the MIP images confirms the above findings.  IMPRESSION: 1. Recent T9 compression fracture with severe height loss but no bony retropulsion. 2. Negative for pulmonary embolism. 3. No acute intra-abdominal findings.   Electronically Signed   By: Tiburcio Pea M.D.   On: 12/02/2013 01:23   Ct Abdomen Pelvis W Contrast  12/02/2013   CLINICAL DATA:  Abdominal pain. Left upper rib cage pain. Initial encounter.  EXAM: CT ANGIOGRAPHY CHEST  CT ABDOMEN AND PELVIS WITH CONTRAST  TECHNIQUE: Multidetector CT imaging of the chest was performed using the standard protocol during bolus administration of intravenous contrast. Multiplanar CT image reconstructions and MIPs were obtained to evaluate the vascular anatomy. Multidetector CT imaging of the abdomen and pelvis was performed using the standard protocol during bolus administration of intravenous contrast.  CONTRAST:  OMNIPAQUE IOHEXOL 350 MG/ML SOLN  COMPARISON:  None.  FINDINGS: CTA CHEST FINDINGS  THORACIC INLET/BODY WALL:  15 mm thyroid nodule in the inferior right lobe. This is likely incidental based on size and patient age.  No acute findings.  MEDIASTINUM:  Normal heart size. No pericardial effusion. Diffuse atherosclerosis, including the coronary arteries. Status post coronary  stenting. Negative for acute aortic finding (as permitted by non opacification of the mid thoracic aorta). Negative for pulmonary embolism. Moderate sliding-type hiatal hernia without obstruction.  LUNG WINDOWS:  Subpleural reticulation which is likely chronic. There is no edema, pneumonia, effusion, or pneumothorax. No suspicious pulmonary nodules.  OSSEOUS:  There is a T9 compression fracture with height loss greater than 75%. Although an acute fracture  line is not visible, there is surrounding relatively low-density perispinal fat infiltration - likely edema or hemorrhage. No definite bowing of the fracture, enhancing soft tissue, or sclerosis suggestive of pathologic fracture. No subluxation.  Remote T7 compression fracture status post bone augmentation.  Remote right-sided rib fractures.  Right glenohumeral arthroplasty with anterior subluxation of humeral prosthesis, commonly encounter.  CT ABDOMEN and PELVIS FINDINGS  BODY WALL: Unremarkable.  ABDOMEN/PELVIS:  Liver: No focal abnormality.  Biliary: Cholecystectomy.  Pancreas: Unremarkable.  Spleen: Granulomatous changes. Heterogeneous enhancement of the inferior spleen resolves on delayed imaging. No splenic mass.  Adrenals: Unremarkable.  Kidneys and ureters: No hydronephrosis or stone.  Bladder: Moderately distended.  Reproductive: Low pelvic floor. Status post hysterectomy. No adnexal mass.  Bowel: Moderate sliding-type hiatal hernia. No bowel obstruction. No pericecal inflammation.  Retroperitoneum: No mass or adenopathy.  Peritoneum: No ascites or pneumoperitoneum.  Vascular: Extensive atherosclerosis, status post aorto bi femoral bypass. The graft is patent. No acute vascular findings.  OSSEOUS: Left total hip arthroplasty. Remote proximal right femur ORIF. No adverse findings. Remote insufficiency fractures of the right sacral ala and inferior pubic ramus. No acute fracture identified. L4-5 laminotomy.  Review of the MIP images confirms the above findings.  IMPRESSION: 1. Recent T9 compression fracture with severe height loss but no bony retropulsion. 2. Negative for pulmonary embolism. 3. No acute intra-abdominal findings.   Electronically Signed   By: Tiburcio Pea M.D.   On: 12/02/2013 01:23    Scheduled Meds: . baclofen  10 mg Oral TID  . docusate sodium  100 mg Oral BID  . escitalopram  5 mg Oral QPM  . hydroxychloroquine  200 mg Oral BID  . ipratropium-albuterol  3 mL Nebulization TID   . isosorbide mononitrate  30 mg Oral q morning - 10a  . lactose free nutrition  237 mL Oral Q24H  . lidocaine  2 patch Transdermal Q24H  . Linaclotide  290 mcg Oral q morning - 10a  . multivitamin with minerals  1 tablet Oral Q1200  . omeprazole  20 mg Oral q morning - 10a  . senna  1 tablet Oral BID  . sodium chloride  3 mL Intravenous Q12H  . sulfaSALAzine  500 mg Oral TID   Continuous Infusions: . sodium chloride 75 mL/hr at 12/03/13 1043  . heparin 900 Units/hr (12/03/13 0609)      Time spent: 25 minutes    [facility], Julienne Vogler  [facility] Pager [phone]. If 7PM-7AM, please contact night-coverage at [email or web address], password [facility] 12/03/2013, 4:34 PM  LOS: 2 days

## 2013-12-03 NOTE — Progress Notes (Addendum)
ANTICOAGULATION CONSULT NOTE  Pharmacy Consult for heparin Indication: VTE treatment  Allergies  Allergen Reactions  . Codeine Nausea And Vomiting, Swelling and Rash  . Hydrocodone Nausea And Vomiting, Swelling and Rash    "mouth peeled"  . Morphine And Related Other (See Comments)    hallucinations  . Other Nausea And Vomiting    Most pain meds! other than tramadol. Tylenol, Toradol, and Robaxin are ok.    . Penicillins Hives  . Darvocet [Propoxyphene N-Acetaminophen] Hives and Nausea And Vomiting  . Demerol [Meperidine] Nausea And Vomiting  . Statins Other (See Comments)    INTOLERANT OF STATINS - messes up her legs and has muscle aches  . Tape Other (See Comments)    Needs paper tape - skin very fragile    Patient Measurements: Height: 5\' 3"  (160 cm) Weight: 117 lb 3.2 oz (53.162 kg) IBW/kg (Calculated) : 52.4   Vital Signs: Temp: 98 F (36.7 C) (10/14 1426) Temp Source: Oral (10/14 1426) BP: 148/73 mmHg (10/14 1426) Pulse Rate: 73 (10/14 1426)  Labs:  Recent Labs  12/01/13 1723  12/02/13 0640 12/02/13 0956 12/02/13 1555 12/02/13 2104 12/03/13 0540 12/03/13 1459  HGB 11.6*  --  11.2*  --   --   --  10.2*  --   HCT 35.4*  --  33.9*  --   --   --  30.7*  --   PLT 208  --  204  --   --   --  172  --   LABPROT  --   < > 23.6*  --   --  22.7* 22.0*  --   INR  --   < > 2.09*  --   --  1.98* 1.90*  --   HEPARINUNFRC  --   --   --   --   --   --  0.14* 0.27*  CREATININE 0.53  --  0.47*  --   --   --   --   --   TROPONINI  --   --  <0.30 <0.30 <0.30  --   --   --   < > = values in this interval not displayed.  Estimated Creatinine Clearance: 45.6 ml/min (by C-G formula based on Cr of 0.47).  Assessment: HPI:  80 y/o F with PMhx of angina, iron deficiency, HTN, HLD, COPD, DVT on coumadin, GERD, stroke, RA, OA, DJD presented to [facility] ED with main concern of several days duration of persistent abd discomfort, associated with cramping, poor oral intake, 5/10 in  severity, with no specific alleviating or aggravating factors.  Pharmacy consulted to start heparin when INR <2.0, heparin started 10/13 2200 with plans for IR to perform vertebroplasty when INR < 1.5  Today, 10/14  Heparin level, not therapeutic x 2 .  Current heparin level = 0.27 on 900 units/hr  CBC: Hgb = 10.2 - trending down, pltc WNL  Latest INR = 1.98  No bleeding documented   Spoke with RN, states infusion interrupted frequently d/t location of IV, IV site changed at 13:00  Goal of Therapy:  Heparin level 0.3-0.7 units/ml :Monitor platelets by anticoagulation protocol:yes   Plan:   Due to interruptions in infusion, will recheck level tonight at 20:00  Shoot for lower end heparin level goal (0.3-0.5) while INR is 1.5 - 1.99  Daily CBC, heparin level  11/14, PharmD, BCPS.   Pager: Juliette Alcide 12/03/2013 4:24 PM

## 2013-12-03 NOTE — H&P (Signed)
Reason for Consult: Compression fracture, uncontrolled back pain. Chief Complaint: Chief Complaint  Patient presents with  . Abdominal Pain   Referring Physician(s): TRH  History of Present Illness: [name] is a 80 y.o. female presented with abdominal pain, back pain, decreased appetite. The patient states her back pain has been going on for 3 weeks after falling on the bed she currently rates it 8/10, her pain is uncontrolled with tramadol and Robaxin q 6 hours. She was living by herself and using a walker and performing ADL's on her own, however recently she has been staying with her sister. CT imaging revealed compression fracture thoracic level 9. IR received consult for vertebroplasty, She has previously had lower back VP that have been successful with pain relief. She is on coumadin for a LE DVT and was switched to IV heparin for the VP.  CT was reviewed by Dr. Estanislado Pandy and patient was scheduled to have VP at [facility] on 10/15 however INR still high and patient's family would not like the patient to be transferred to [facility] for procedure and would prefer to wait and have procedure here at [facility] Friday if possible. She denies any known complications to sedation and only complaint today is back pain and mid epigastric pain. She denies any active bleeding, nausea or vomiting. She denies any active chest pain or shortness of breath.   Past Medical History  Diagnosis Date  . History of  Angina     Treated with PCI in 2004; no active Symptoms.  . Hiatal hernia   . GERD (gastroesophageal reflux disease)   . Iron deficiency anemia   . Barrett esophagus   . Osteoporosis   . HLD (hyperlipidemia)     Statin intolerant  . Internal hemorrhoids   . Diverticulosis   . Pain     RIGHT SHOULDER WITH LIMITED ROM--STATES PREVIOUS RT TOTAL SHOULDDER REPLACEMENT-BUT STILL HAS PROBLEMS WITH SHOULDER  . CAD S/P percutaneous coronary angioplasty 2004    PCI-RCA (2.20m x 12mTaxus DES)& Cx (2.75 mm x 14 mm  Cypher DES)  . PAD (peripheral artery disease) 1994    History of right iliac stenting, fem-fem bypass in 94; aortobifem bypass/ AAA repair - November 2004; latest ABIs of been roughly 1 bilaterally, followed by Dr. EaDonnetta Hutching. HTN (hypertension)     sometimes  . Shortness of breath     MILD- WITH EXERTION. chronic  . Nocturia   . DVT (deep venous thrombosis)     BLE  . COPD (chronic obstructive pulmonary disease)   . Pneumonia     "several times"  . History of blood transfusion     "not sure why"  . History of stomach ulcers   . Migraine     "used to have them; not anymore" (08/22/2013)  . Stroke     "showed up on XRAY" (08/22/2013)  . Osteoarthritis of both hips      status post multiple hip operations, recent fall with fracture.    . Marland KitchenJD (degenerative joint disease)   . Rheumatoid arthritis   . Fall from slip, trip, or stumble 08/20/2013    Past Surgical History  Procedure Laterality Date  . Back surgery      x 2  . Appendectomy  1950  . Total shoulder replacement Right 2011  . Cholecystectomy      several yrs ago  . Cataract extraction w/ intraocular lens  implant, bilateral Bilateral 2001-2002  . Total abdominal hysterectomy  1965  .  Upper gastrointestinal endoscopy  multiple    w/bx, hiatal hernia, Barretts', esophageal polyp 1x  . Colonoscopy  05/09/2004; 06/08/1999    internal hemorrhoids; diverticulosis  . Shoulder open rotator cuff repair Bilateral     2 times on left  . Lumbar spine surgery  October 2012    spinal stenosis, Dr. Lorin Mercy  . Abdominal aortic aneurysm repair  2004    AortoBi-Fem Bypass  . Carpal tunnel release Left 04/2011  . Total hip arthroplasty  01/26/2012    Procedure: TOTAL HIP ARTHROPLASTY ANTERIOR APPROACH;  Surgeon: Mcarthur Rossetti, MD;  Location: [facility];  Service: Orthopedics;  Laterality: Left;  Left Total Hip Arthroplasty, Anterior Approach (C-Arm)  . Intramedullary (im) nail intertrochanteric Right 04/12/2012    Procedure: INTRAMEDULLARY  (IM) NAIL INTERTROCHANTRIC Right;  Surgeon: Mcarthur Rossetti, MD;  Location: [place];  Service: Orthopedics;  Laterality: Right;  . Doppler echocardiography  06/06/2012    EF 50-60%.  Mild LVH, grade one diastolic function; normal PA pressures and CVP  . Lower extremity venous dopplers  08/16/2012    Partially occlusive right femoral vein thrombosis, also noted in right peroneal vein; no thrombosis in left sided veins.  . Nm myoview ltd  April 2014    Bowel artifact, No ischemia or infarction  . Femoral-femoral bypass graft  1994    Following right iliac stenting  . Doppler echocardiography  06/06/2012    Mild LVH, EF 55-60%.  Normal wall motion.  Mildly elevated LVEDP with grade one diastolic function.  Mild LA dilatation.  Normal PA pressures.  . Tubal ligation  1956  . Excision/release bursa hip Right 12/27/2012    Procedure: RIGHT HIP BURSECTOMY AND COMPRESSION SCREW REMOVAL;  Surgeon: Mcarthur Rossetti, MD;  Location: [facility];  Service: Orthopedics;  Laterality: Right;  . Joint replacement    . Coronary angioplasty with stent placement  2004    RCA: Taxus DES 2.5 mm x 12 mm; Circumflex: Cypher DES 2.75 mm 14 mm  . Cardiac catheterization  October 2005    Patent RCA stent, 20% ISR of Cx stent; EF 50-55%    Allergies: Codeine; Hydrocodone; Morphine and related; Other; Penicillins; Darvocet; Demerol; Statins; and Tape  Medications: Prior to Admission medications   Medication Sig Start Date End Date Taking? Authorizing Provider  Calcium Carbonate-Vitamin D (CALTRATE 600+D) 600-400 MG-UNIT per tablet Take 1 tablet by mouth every evening.    Yes Historical Provider, MD  Camphor-Menthol-Methyl Sal (SALONPAS) 1.2-5.7-6.3 % PTCH Place 1 patch onto the skin daily as needed (For pain.).   Yes Historical Provider, MD  cholecalciferol (VITAMIN D) 1000 UNITS tablet Take 2,000 Units by mouth every morning.   Yes Historical Provider, MD  docusate sodium (COLACE) 100 MG capsule Take 100 mg  by mouth 2 (two) times daily.    Yes Historical Provider, MD  escitalopram (LEXAPRO) 5 MG tablet Take 5 mg by mouth every evening.    Yes Historical Provider, MD  Flaxseed, Linseed, (FLAX SEED OIL PO) Take 1 tablet by mouth daily at 12 noon.    Yes Historical Provider, MD  hydroxychloroquine (PLAQUENIL) 200 MG tablet Take 200 mg by mouth 2 (two) times daily.    Yes Historical Provider, MD  isosorbide mononitrate (IMDUR) 30 MG 24 hr tablet Take 30 mg by mouth every morning.   Yes Historical Provider, MD  Linaclotide Rolan Lipa) 145 MCG CAPS capsule Take 290 mcg by mouth every morning.    Yes Historical Provider, MD  magnesium citrate SOLN Take  1 Bottle by mouth once.   Yes Historical Provider, MD  methocarbamol (ROBAXIN) 500 MG tablet Take 1 tablet (500 mg total) by mouth 3 (three) times daily as needed (For muscle spasms.). 01/01/13  Yes Erskine Emery, PA-C  Multiple Vitamin (MULTIVITAMIN WITH MINERALS) TABS tablet Take 1 tablet by mouth daily at 12 noon.   Yes Historical Provider, MD  omeprazole (PRILOSEC OTC) 20 MG tablet Take 20 mg by mouth every morning.    Yes Historical Provider, MD  polyvinyl alcohol (LIQUIFILM TEARS) 1.4 % ophthalmic solution Place 1 drop into both eyes as needed (Dry eyes.). 04/15/12  Yes Srikar Janna Arch, MD  promethazine (PHENERGAN) 12.5 MG tablet Take 12.5 mg by mouth every 6 (six) hours as needed for nausea (Patient takes with Tramadol).   Yes Historical Provider, MD  sulfaSALAzine (AZULFIDINE) 500 MG tablet Take 500 mg by mouth 3 (three) times daily.    Yes Historical Provider, MD  traMADol (ULTRAM) 50 MG tablet Take 2 tablets (100 mg total) by mouth every 6 (six) hours as needed for moderate pain. 08/23/13  Yes Thurnell Lose, MD  warfarin (COUMADIN) 3 MG tablet Take 1 tablet (3 mg total) by mouth See admin instructions. States takes Monday, Wednesday 4.5 mg Tuesday, Thursday Friday Sat Sun 3 mg. Get your INR checked by PCP in 1 day. 08/26/13  Yes Thurnell Lose, MD  glucose  monitoring kit (FREESTYLE) monitoring kit 1 each by Does not apply route 4 (four) times daily - after meals and at bedtime. 1 month Diabetic Testing Supplies for QAC-QHS accuchecks.Any brand OK 08/23/13   Thurnell Lose, MD    Family History  Problem Relation Age of Onset  . Prostate cancer Father   . Diabetes Mother   . Colon cancer Neg Hx   . Breast cancer Mother   . Heart disease Mother     History   Social History  . Marital Status: Widowed    Spouse Name: N/A    Number of Children: N/A  . Years of Education: N/A   Occupational History  . retired    Social History Main Topics  . Smoking status: Former Smoker -- 1.00 packs/day for 50 years    Types: Cigarettes    Quit date: 04/20/2000  . Smokeless tobacco: Never Used  . Alcohol Use: No  . Drug Use: No  . Sexual Activity: No   Other Topics Concern  . None   Social History Narrative    She is currently living back at home after a brief stay at [facility] for postoperative rehab.  Currently doing physical therapy.       She is widowed, and has no living children.  One of her granddaughters is with her.   She has 2 grandchildren and 1 great-grandchild. Does not smoke, does not drink.     Review of Systems: A 12 point ROS discussed and pertinent positives are indicated in the HPI above.  All other systems are negative.  Review of Systems  Vital Signs: BP 148/73  Pulse 73  Temp(Src) 98 F (36.7 C) (Oral)  Resp 20  Ht 5' 3"  (1.6 m)  Wt 117 lb 3.2 oz (53.162 kg)  BMI 20.77 kg/m2  SpO2 98%  Physical Exam General: A&Ox3, NAD Heart: RRR without M/G/R Lungs: CTA bilaterally  Abd: Soft, tenderness in epigastric region.  Back: Thoracic region TTP  Imaging: Ct Angio Chest Pe W/cm &/or Wo Cm  12/02/2013   CLINICAL DATA:  Abdominal pain. Left  upper rib cage pain. Initial encounter.  EXAM: CT ANGIOGRAPHY CHEST  CT ABDOMEN AND PELVIS WITH CONTRAST  TECHNIQUE: Multidetector CT imaging of the chest was performed  using the standard protocol during bolus administration of intravenous contrast. Multiplanar CT image reconstructions and MIPs were obtained to evaluate the vascular anatomy. Multidetector CT imaging of the abdomen and pelvis was performed using the standard protocol during bolus administration of intravenous contrast.  CONTRAST:  168m OMNIPAQUE IOHEXOL 350 MG/ML SOLN  COMPARISON:  None.  FINDINGS: CTA CHEST FINDINGS  THORACIC INLET/BODY WALL:  15 mm thyroid nodule in the inferior right lobe. This is likely incidental based on size and patient age.  No acute findings.  MEDIASTINUM:  Normal heart size. No pericardial effusion. Diffuse atherosclerosis, including the coronary arteries. Status post coronary stenting. Negative for acute aortic finding (as permitted by non opacification of the mid thoracic aorta). Negative for pulmonary embolism. Moderate sliding-type hiatal hernia without obstruction.  LUNG WINDOWS:  Subpleural reticulation which is likely chronic. There is no edema, pneumonia, effusion, or pneumothorax. No suspicious pulmonary nodules.  OSSEOUS:  There is a T9 compression fracture with height loss greater than 75%. Although an acute fracture line is not visible, there is surrounding relatively low-density perispinal fat infiltration - likely edema or hemorrhage. No definite bowing of the fracture, enhancing soft tissue, or sclerosis suggestive of pathologic fracture. No subluxation.  Remote T7 compression fracture status post bone augmentation.  Remote right-sided rib fractures.  Right glenohumeral arthroplasty with anterior subluxation of humeral prosthesis, commonly encounter.  CT ABDOMEN and PELVIS FINDINGS  BODY WALL: Unremarkable.  ABDOMEN/PELVIS:  Liver: No focal abnormality.  Biliary: Cholecystectomy.  Pancreas: Unremarkable.  Spleen: Granulomatous changes. Heterogeneous enhancement of the inferior spleen resolves on delayed imaging. No splenic mass.  Adrenals: Unremarkable.  Kidneys and  ureters: No hydronephrosis or stone.  Bladder: Moderately distended.  Reproductive: Low pelvic floor. Status post hysterectomy. No adnexal mass.  Bowel: Moderate sliding-type hiatal hernia. No bowel obstruction. No pericecal inflammation.  Retroperitoneum: No mass or adenopathy.  Peritoneum: No ascites or pneumoperitoneum.  Vascular: Extensive atherosclerosis, status post aorto bi femoral bypass. The graft is patent. No acute vascular findings.  OSSEOUS: Left total hip arthroplasty. Remote proximal right femur ORIF. No adverse findings. Remote insufficiency fractures of the right sacral ala and inferior pubic ramus. No acute fracture identified. L4-5 laminotomy.  Review of the MIP images confirms the above findings.  IMPRESSION: 1. Recent T9 compression fracture with severe height loss but no bony retropulsion. 2. Negative for pulmonary embolism. 3. No acute intra-abdominal findings.   Electronically Signed   By: JJorje GuildM.D.   On: 12/02/2013 01:23   Ct Abdomen Pelvis W Contrast  12/02/2013   CLINICAL DATA:  Abdominal pain. Left upper rib cage pain. Initial encounter.  EXAM: CT ANGIOGRAPHY CHEST  CT ABDOMEN AND PELVIS WITH CONTRAST  TECHNIQUE: Multidetector CT imaging of the chest was performed using the standard protocol during bolus administration of intravenous contrast. Multiplanar CT image reconstructions and MIPs were obtained to evaluate the vascular anatomy. Multidetector CT imaging of the abdomen and pelvis was performed using the standard protocol during bolus administration of intravenous contrast.  CONTRAST:  1082mOMNIPAQUE IOHEXOL 350 MG/ML SOLN  COMPARISON:  None.  FINDINGS: CTA CHEST FINDINGS  THORACIC INLET/BODY WALL:  15 mm thyroid nodule in the inferior right lobe. This is likely incidental based on size and patient age.  No acute findings.  MEDIASTINUM:  Normal heart size. No pericardial effusion. Diffuse atherosclerosis,  including the coronary arteries. Status post coronary stenting.  Negative for acute aortic finding (as permitted by non opacification of the mid thoracic aorta). Negative for pulmonary embolism. Moderate sliding-type hiatal hernia without obstruction.  LUNG WINDOWS:  Subpleural reticulation which is likely chronic. There is no edema, pneumonia, effusion, or pneumothorax. No suspicious pulmonary nodules.  OSSEOUS:  There is a T9 compression fracture with height loss greater than 75%. Although an acute fracture line is not visible, there is surrounding relatively low-density perispinal fat infiltration - likely edema or hemorrhage. No definite bowing of the fracture, enhancing soft tissue, or sclerosis suggestive of pathologic fracture. No subluxation.  Remote T7 compression fracture status post bone augmentation.  Remote right-sided rib fractures.  Right glenohumeral arthroplasty with anterior subluxation of humeral prosthesis, commonly encounter.  CT ABDOMEN and PELVIS FINDINGS  BODY WALL: Unremarkable.  ABDOMEN/PELVIS:  Liver: No focal abnormality.  Biliary: Cholecystectomy.  Pancreas: Unremarkable.  Spleen: Granulomatous changes. Heterogeneous enhancement of the inferior spleen resolves on delayed imaging. No splenic mass.  Adrenals: Unremarkable.  Kidneys and ureters: No hydronephrosis or stone.  Bladder: Moderately distended.  Reproductive: Low pelvic floor. Status post hysterectomy. No adnexal mass.  Bowel: Moderate sliding-type hiatal hernia. No bowel obstruction. No pericecal inflammation.  Retroperitoneum: No mass or adenopathy.  Peritoneum: No ascites or pneumoperitoneum.  Vascular: Extensive atherosclerosis, status post aorto bi femoral bypass. The graft is patent. No acute vascular findings.  OSSEOUS: Left total hip arthroplasty. Remote proximal right femur ORIF. No adverse findings. Remote insufficiency fractures of the right sacral ala and inferior pubic ramus. No acute fracture identified. L4-5 laminotomy.  Review of the MIP images confirms the above findings.   IMPRESSION: 1. Recent T9 compression fracture with severe height loss but no bony retropulsion. 2. Negative for pulmonary embolism. 3. No acute intra-abdominal findings.   Electronically Signed   By: Jorje Guild M.D.   On: 12/02/2013 01:23    Labs:  CBC:  Recent Labs  08/21/13 0356 12/01/13 1723 12/02/13 0640 12/03/13 0540  WBC 7.9 5.6 5.7 4.4  HGB 9.7* 11.6* 11.2* 10.2*  HCT 29.8* 35.4* 33.9* 30.7*  PLT 151 208 204 172    COAGS:  Recent Labs  12/24/12 1420  12/01/13 2359 12/02/13 0640 12/02/13 2104 12/03/13 0540  INR 1.42  < > 2.24* 2.09* 1.98* 1.90*  APTT 43*  --   --   --   --   --   < > = values in this interval not displayed.  BMP:  Recent Labs  08/20/13 1211 08/21/13 0356 08/22/13 0551 12/01/13 1723 12/02/13 0640  NA 136* 133*  --  134* 135*  K 3.8 3.6* 4.9 4.5 3.9  CL 99 96  --  96 99  CO2 24 23  --  25 25  GLUCOSE 146* 100*  --  104* 114*  BUN 7 7  --  8 5*  CALCIUM 9.0 8.7  --  9.2 8.9  CREATININE 0.51 0.52  --  0.53 0.47*  GFRNONAA 88* 87*  --  87* >90  GFRAA >90 >90  --  >90 >90    LIVER FUNCTION TESTS:  Recent Labs  08/21/13 0356 12/01/13 1723  BILITOT 0.6 0.3  AST 30 22  ALT 23 12  ALKPHOS 76 91  PROT 5.9* 6.8  ALBUMIN 2.8* 3.5   Assessment and Plan: Abdominal pain unclear etiology, TRH following muscular?  Back pain x 3 weeks after falling on bed, pain uncontrolled with tramadol and Robaxin q 6 hours.  Compression fracture thoracic level 9 Request for consult for vertebroplasty, CT reviewed by Dr. Estanislado Pandy and patient was scheduled to have VP at [facility] on 10/15 however INR still high and patient's family would not like the patient to be transferred to [facility] for procedure and would prefer to wait and have procedure here at [facility] Friday if possible.   History of DVT on coumadin, this has been held INR 1.9 today, will need < 1.5 on IV heparin Risks and Benefits discussed with the patient and her family, they are concerned about her  having to lay flat on her stomach with her current abdominal pain and would like to continue to think the procedure over and IR will check again Friday 10/16. All of the patient's questions were answered.   Thank you for this interesting consult.  I greatly enjoyed meeting [name] and look forward to participating in their care.   I spent a total of 40 minutes face to face in clinical consultation, greater than 50% of which was counseling/coordinating care  Signed: Hedy Jacob 12/03/2013, 4:22 PM

## 2013-12-03 NOTE — Evaluation (Signed)
Physical Therapy Evaluation Patient Details Name: [name] MRN: [id number] DOB: June 15, 1930 Today's Date: 12/03/2013   History of Present Illness  80 y/o Fadmitted 12/01/13 with PMhx of angina, iron deficiency, HTN, HLD, COPD, DVT on coumadin, GERD, stroke, RA, OA, DJD presented to [facility] ED with main concern of several days duration of persistent abd discomfort, associated with cramping, poor oral intake. [name] Recent T9 compression fracture, being concidered for Kyphoplasty.  Clinical Impression  Pt reports she is to have the kyphoplasty thursday.not noted in the chart yet. Patient will benefit from PT  For mobility and /dc planning after procedure.     Follow Up Recommendations Home health PT;Supervision/Assistance - 24 hour    Equipment Recommendations  None recommended by PT    Recommendations for Other Services OT consult     Precautions / Restrictions Precautions Precautions: Fall;Back      Mobility  Bed Mobility Overal bed mobility: Needs Assistance Bed Mobility: Rolling Rolling: Min assist         General bed mobility comments: cues for rolling, back precautions  Transfers Overall transfer level: Needs assistance Equipment used: Rolling walker (2 wheeled) Transfers: Sit to/from Stand Sit to Stand: +2 physical assistance         General transfer comment: mod assist for stability, multimodal cues for safety  Ambulation/Gait                Stairs            Wheelchair Mobility    Modified Rankin (Stroke Patients Only)       Balance Overall balance assessment: Needs assistance Sitting-balance support: No upper extremity supported;Feet supported Sitting balance-Leahy Scale: Fair Sitting balance - Comments: limited for back and abd. pain                                     Pertinent Vitals/Pain Pain Assessment: 0-10 Pain Score: 8  Pain Location: in mid  abdomen, none lying in bed Pain Descriptors / Indicators:  Cramping;Sharp Pain Intervention(s): Monitored during session;Limited activity within patient's tolerance;Premedicated before session;Repositioned    Home Living Family/patient expects to be discharged to:: Private residence Living Arrangements: Other relatives (sister) Available Help at Discharge: Family;Available PRN/intermittently Type of Home: House Home Access: Level entry       Home Equipment: Walker - 2 wheels;Cane - single point;Walker - 4 wheels      Prior Function Level of Independence: Independent with assistive device(s)         Comments: in house     Hand Dominance        Extremity/Trunk Assessment   Upper Extremity Assessment:  (limited elevation with and reaching due to pain in abd.)           Lower Extremity Assessment: Generalized weakness      Cervical / Trunk Assessment: Kyphotic  Communication   Communication: No difficulties  Cognition Arousal/Alertness: Awake/alert   Overall Cognitive Status: Within Functional Limits for tasks assessed                      General Comments      Exercises        Assessment/Plan    PT Assessment Patient needs continued PT services  PT Diagnosis Difficulty walking;Acute pain   PT Problem List Decreased strength;Decreased activity tolerance;Decreased balance;Decreased mobility;Decreased safety awareness;Decreased knowledge of use of DME;Decreased cognition;Pain  PT Treatment Interventions DME  instruction;Gait training;Functional mobility training;Therapeutic activities;Therapeutic exercise   PT Goals (Current goals can be found in the Care Plan section) Acute Rehab PT Goals Patient Stated Goal: to  go home after the cement is put in my spine PT Goal Formulation: With patient Time For Goal Achievement: 12/17/13 Potential to Achieve Goals: Good    Frequency Min 3X/week   Barriers to discharge        Co-evaluation               End of Session   Activity Tolerance: Patient  limited by pain Patient left: in chair;with call bell/phone within reach;with chair alarm set Nurse Communication: Mobility status         Time: 1200-1223 PT Time Calculation (min): 23 min   Charges:   PT Evaluation $Initial PT Evaluation Tier I: 1 Procedure PT Treatments $Therapeutic Activity: 23-37 mins   PT G Codes:          Rada Hay 12/03/2013, 1:10 PM Blanchard Kelch PT [phone]

## 2013-12-03 NOTE — Progress Notes (Signed)
ANTICOAGULATION CONSULT NOTE  Pharmacy Consult for heparin Indication: VTE treatment  Allergies  Allergen Reactions  . Codeine Nausea And Vomiting, Swelling and Rash  . Hydrocodone Nausea And Vomiting, Swelling and Rash    "mouth peeled"  . Morphine And Related Other (See Comments)    hallucinations  . Other Nausea And Vomiting    Most pain meds! other than tramadol. Tylenol, Toradol, and Robaxin are ok.    . Penicillins Hives  . Darvocet [Propoxyphene N-Acetaminophen] Hives and Nausea And Vomiting  . Demerol [Meperidine] Nausea And Vomiting  . Statins Other (See Comments)    INTOLERANT OF STATINS - messes up her legs and has muscle aches  . Tape Other (See Comments)    Needs paper tape - skin very fragile    Patient Measurements: Height: 5\' 3"  (160 cm) Weight: 117 lb 3.2 oz (53.162 kg) IBW/kg (Calculated) : 52.4   Vital Signs: Temp: 98 F (36.7 C) (10/14 1426) Temp Source: Oral (10/14 1426) BP: 148/73 mmHg (10/14 1426) Pulse Rate: 73 (10/14 1426)  Labs:  Recent Labs  12/01/13 1723  12/02/13 0640 12/02/13 0956 12/02/13 1555 12/02/13 2104 12/03/13 0540 12/03/13 1459 12/03/13 1937  HGB 11.6*  --  11.2*  --   --   --  10.2*  --   --   HCT 35.4*  --  33.9*  --   --   --  30.7*  --   --   PLT 208  --  204  --   --   --  172  --   --   LABPROT  --   < > 23.6*  --   --  22.7* 22.0*  --   --   INR  --   < > 2.09*  --   --  1.98* 1.90*  --   --   HEPARINUNFRC  --   --   --   --   --   --  0.14* 0.27* 0.22*  CREATININE 0.53  --  0.47*  --   --   --   --   --   --   TROPONINI  --   --  <0.30 <0.30 <0.30  --   --   --   --   < > = values in this interval not displayed.  Estimated Creatinine Clearance: 45.6 ml/min (by C-G formula based on Cr of 0.47).  Assessment: HPI:  80 y/o F with PMhx of angina, iron deficiency, HTN, HLD, COPD, DVT on coumadin, GERD, stroke, RA, OA, DJD presented to [facility] ED with main concern of several days duration of persistent abd discomfort,  associated with cramping, poor oral intake, 5/10 in severity, with no specific alleviating or aggravating factors.  Pharmacy consulted to start heparin when INR <2.0, heparin started 10/13 2200 with plans for IR to perform vertebroplasty when INR < 1.5  Today, 10/14  Heparin level, not therapeutic x 2 .  Current heparin level = 0.22 (down from earlier level of 0.27) on 900 units/hr. RN relayed that heparin gtt had been frequently intefrupted earlier in day, thus reason for level recheck.    CBC: Hgb = 10.2 - trending down, pltc WNL  Latest INR = 1.98  No bleeding documented   Spoke with RN, states infusion interrupted frequently d/t location of IV, IV site changed at 13:00  Goal of Therapy:  Heparin level 0.3-0.7 units/ml :Monitor platelets by anticoagulation protocol:yes   Plan:   Increase heparin rate to 1000 units/hr  Shoot for lower end heparin level goal (0.3-0.5) while INR is 1.5 - 1.99  Daily CBC, heparin level  Juliette Alcide, PharmD, BCPS.   Pager: [phone] 12/03/2013 8:35 PM

## 2013-12-03 NOTE — Progress Notes (Signed)
ANTICOAGULATION CONSULT NOTE - Follow Up Consult  Pharmacy Consult for Heparin Indication: VTE Treatment  Allergies  Allergen Reactions  . Codeine Nausea And Vomiting, Swelling and Rash  . Hydrocodone Nausea And Vomiting, Swelling and Rash    "mouth peeled"  . Morphine And Related Other (See Comments)    hallucinations  . Other Nausea And Vomiting    Most pain meds! other than tramadol. Tylenol, Toradol, and Robaxin are ok.    . Penicillins Hives  . Darvocet [Propoxyphene N-Acetaminophen] Hives and Nausea And Vomiting  . Demerol [Meperidine] Nausea And Vomiting  . Statins Other (See Comments)    INTOLERANT OF STATINS - messes up her legs and has muscle aches  . Tape Other (See Comments)    Needs paper tape - skin very fragile    Patient Measurements: Height: 5\' 3"  (160 cm) Weight: 117 lb 3.2 oz (53.162 kg) IBW/kg (Calculated) : 52.4 Heparin Dosing Weight:   Vital Signs: Temp: 97.6 F (36.4 C) (10/14 0436) Temp Source: Oral (10/14 0436) BP: 168/73 mmHg (10/14 0436) Pulse Rate: 71 (10/14 0436)  Labs:  Recent Labs  12/01/13 1723  12/02/13 0640 12/02/13 0956 12/02/13 1555 12/02/13 2104 12/03/13 0540  HGB 11.6*  --  11.2*  --   --   --  10.2*  HCT 35.4*  --  33.9*  --   --   --  30.7*  PLT 208  --  204  --   --   --  172  LABPROT  --   < > 23.6*  --   --  22.7* 22.0*  INR  --   < > 2.09*  --   --  1.98* 1.90*  HEPARINUNFRC  --   --   --   --   --   --  0.14*  CREATININE 0.53  --  0.47*  --   --   --   --   TROPONINI  --   --  <0.30 <0.30 <0.30  --   --   < > = values in this interval not displayed.  Estimated Creatinine Clearance: 45.6 ml/min (by C-G formula based on Cr of 0.47).   Medications:  Infusions:  . sodium chloride 75 mL/hr at 12/02/13 0526  . heparin      Assessment: Patient with low heparin level.  No issues per RN.  Goal of Therapy:  Heparin level 0.3-0.7 units/ml Monitor platelets by anticoagulation protocol: Yes   Plan:  Increase  heparin to 900 units/hr Recheck level at 1500.  12/04/13, [name] 12/03/2013,6:09 AM

## 2013-12-04 LAB — CBC
HEMATOCRIT: 30.9 % — AB (ref 36.0–46.0)
Hemoglobin: 10.1 g/dL — ABNORMAL LOW (ref 12.0–15.0)
MCH: 29.2 pg (ref 26.0–34.0)
MCHC: 32.7 g/dL (ref 30.0–36.0)
MCV: 89.3 fL (ref 78.0–100.0)
Platelets: 180 10*3/uL (ref 150–400)
RBC: 3.46 MIL/uL — ABNORMAL LOW (ref 3.87–5.11)
RDW: 14.1 % (ref 11.5–15.5)
WBC: 4.8 10*3/uL (ref 4.0–10.5)

## 2013-12-04 LAB — PROTIME-INR
INR: 1.5 — ABNORMAL HIGH (ref 0.00–1.49)
Prothrombin Time: 18.2 seconds — ABNORMAL HIGH (ref 11.6–15.2)

## 2013-12-04 LAB — HEPARIN LEVEL (UNFRACTIONATED)
Heparin Unfractionated: 0.29 IU/mL — ABNORMAL LOW (ref 0.30–0.70)
Heparin Unfractionated: 0.49 IU/mL (ref 0.30–0.70)

## 2013-12-04 MED ORDER — HEPARIN (PORCINE) IN NACL 100-0.45 UNIT/ML-% IJ SOLN
1000.0000 [IU]/h | INTRAMUSCULAR | Status: DC
  Administered 2013-12-04: 1000 [IU]/h via INTRAVENOUS
  Filled 2013-12-04: qty 250

## 2013-12-04 MED ORDER — HYDROMORPHONE HCL 1 MG/ML IJ SOLN
1.0000 mg | INTRAMUSCULAR | Status: DC | PRN
  Administered 2013-12-04 – 2013-12-05 (×2): 1 mg via INTRAVENOUS
  Filled 2013-12-04 (×2): qty 1

## 2013-12-04 MED ORDER — HYDROCODONE-ACETAMINOPHEN 5-325 MG PO TABS: 1.0000 | ORAL_TABLET | ORAL | Status: DC | PRN

## 2013-12-04 MED ORDER — ACETAMINOPHEN-CODEINE #3 300-30 MG PO TABS: 1.0000 | ORAL_TABLET | ORAL | Status: DC | PRN

## 2013-12-04 MED ORDER — HEPARIN (PORCINE) IN NACL 100-0.45 UNIT/ML-% IJ SOLN
1100.0000 [IU]/h | INTRAMUSCULAR | Status: DC
  Administered 2013-12-04: 1100 [IU]/h via INTRAVENOUS
  Filled 2013-12-04 (×2): qty 250

## 2013-12-04 MED ORDER — TRAMADOL HCL 50 MG PO TABS
50.0000 mg | ORAL_TABLET | Freq: Four times a day (QID) | ORAL | Status: DC | PRN
  Administered 2013-12-05 – 2013-12-06 (×3): 50 mg via ORAL
  Filled 2013-12-04 (×3): qty 1

## 2013-12-04 MED ORDER — ACETAMINOPHEN 500 MG PO TABS
1000.0000 mg | ORAL_TABLET | Freq: Three times a day (TID) | ORAL | Status: DC
  Administered 2013-12-04 – 2013-12-05 (×3): 1000 mg via ORAL
  Filled 2013-12-04 (×5): qty 2

## 2013-12-04 NOTE — Progress Notes (Signed)
[name] WGY:[id number] DOB: 1930-04-01 DOA: 12/01/2013 PCP: Hoyle Sauer, MD  Brief narrative: 80 y/o ? h/o R sided chronic DTt's on Lifelong coumadin, CAd s/p PCI RCA 2004, COPD, Prediabetes base don A1c 08/2013, Htn, GERD, Diverticular disease, Rh Arthritis on plaquenil, Barrett's esophagus, PAD, Fe def anemia-Hip surgery 03/2012 with removal painful Hardware R hip, HLDwith statin inteolerance, Abdominal aortic aneurysm, prior L4-5 stenosis admitted 12/02/13 with  H/o fall and resulting back pain 3 weeks pta.  She does have some difficulty remembering when she fell off and on one side but thinks it was in her right. She was admitted and found to have significant abdominal pain potentially secondary to rectus abdominus bruising with a hematoma palpable in the right rectus abdominis IR was consulted for evaluation of her T9/T10 pain and potential vertebroplasty but patient has been able to lay on to stomach because of rectus abdominis pain  Past medical history-As per Problem list Chart reviewed as below- Reviewed  Consultants:  Interventional radiology  Procedures:  CT scan abdomen pelvis, CT angiogram abdomen pelvis  Antibiotics:  None   Subjective  10/10 him no pain. Difficulty to ambulate or move with this No nausea no vomiting No blurred or double vision Tolerating diet Multiple stools yesterday but is on a home cathartic   Objective    Interim History: Reviewed  Telemetry: Sinus rhythm/sinus tach   Objective: Filed Vitals:   12/03/13 2116 12/04/13 0358 12/04/13 0547 12/04/13 0740  BP: 140/52 181/63    Pulse: 73 74    Temp: 98.1 F (36.7 C) 98.4 F (36.9 C)    TempSrc: Oral Oral    Resp: 18 18    Height:      Weight:   52.4 kg (115 lb 8.3 oz)   SpO2: 95% 100%  98%    Intake/Output Summary (Last 24 hours) at 12/04/13 7793 Last data filed at 12/04/13 0759  Gross per 24 hour  Intake    675 ml  Output   1775 ml  Net  -1100 ml     Exam:  General: Alert frail pleasant Caucasian female, mildly confused with regards to sequence of events Cardiovascular: S1-S2 no murmur rub or gallop Respiratory: Clinically clear no added sound Abdomen: Tender under the middle abdomen just below the 12th rib right side there seems to be an ovoid swelling which is discrete in the abdomen and tender to touch Skin see above Neuro 5/5 power throughout, sensory grossly throughout normal  Data Reviewed: Basic Metabolic Panel:  Recent Labs Lab 12/01/13 1723 12/02/13 0640  NA 134* 135*  K 4.5 3.9  CL 96 99  CO2 25 25  GLUCOSE 104* 114*  BUN 8 5*  CREATININE 0.53 0.47*  CALCIUM 9.2 8.9   Liver Function Tests:  Recent Labs Lab 12/01/13 1723  AST 22  ALT 12  ALKPHOS 91  BILITOT 0.3  PROT 6.8  ALBUMIN 3.5    Recent Labs Lab 12/01/13 1730  LIPASE 24   No results found for this basename: AMMONIA,  in the last 168 hours CBC:  Recent Labs Lab 12/01/13 1723 12/02/13 0640 12/03/13 0540 12/04/13 0345  WBC 5.6 5.7 4.4 4.8  NEUTROABS 3.5  --   --   --   HGB 11.6* 11.2* 10.2* 10.1*  HCT 35.4* 33.9* 30.7* 30.9*  MCV 87.6 86.7 88.0 89.3  PLT 208 204 172 180   Cardiac Enzymes:  Recent Labs Lab 12/02/13 0640 12/02/13 0956 12/02/13 1555  TROPONINI <0.30 <0.30 <0.30  BNP: No components found with this basename: POCBNP,  CBG: No results found for this basename: GLUCAP,  in the last 168 hours  Recent Results (from the past 240 hour(s))  URINE CULTURE     Status: None   Collection Time    12/01/13 10:23 PM      Result Value Ref Range Status   Specimen Description URINE, CLEAN CATCH   Final   Special Requests NONE   Final   Culture  Setup Time     Final   Value: 12/03/2013 01:27     Performed at [facility] Count     Final   Value: >=100,000 COLONIES/ML     Performed at [facility]   Culture     Final   Value: Multiple bacterial morphotypes present, none predominant. Suggest  appropriate recollection if clinically indicated.     Performed at [facility]   Report Status 12/03/2013 FINAL   Final     Studies:              All Imaging reviewed and is as per above notation   Scheduled Meds: . baclofen  10 mg Oral TID  . docusate sodium  100 mg Oral BID  . escitalopram  5 mg Oral QPM  . hydroxychloroquine  200 mg Oral BID  . ipratropium-albuterol  3 mL Nebulization TID  . isosorbide mononitrate  30 mg Oral q morning - 10a  . lactose free nutrition  237 mL Oral Q24H  . lidocaine  2 patch Transdermal Q24H  . Linaclotide  290 mcg Oral q morning - 10a  . multivitamin with minerals  1 tablet Oral Q1200  . omeprazole  20 mg Oral q morning - 10a  . senna  1 tablet Oral BID  . sodium chloride  3 mL Intravenous Q12H  . sulfaSALAzine  500 mg Oral TID   Continuous Infusions: . sodium chloride 75 mL/hr at 12/04/13 0701  . heparin 1,100 Units/hr (12/04/13 0615)     Assessment/Plan:  1. Rectus abdominis hematoma-DDX Spigelian hernia-I am not sure if this is just a hematoma.  I reviewed the films with Dr. Michaell Cowing who will clinically assess her.  Appreciate the input in advance.  Patient now on IV dilaudid 1 mg q. 3 when necessary and Vicodin one to 2 every 4 when necessary [mulitple medication intolerances] for pain control so we'll discontinue baclofen but can continue methocarbamol 500 every 6 when necessary 2. R sided Chronic DVT's-On Heparin Gtt for protection  .IR procedure tentatively planned for 10/16-if pain not controlled, will need to defer this procedure till as an OP.  INr 1.5 today.  Saline lock IV 3. CAD s/p PCI 2004-only on Coumadin monotherapy. From last cardiology visit does not need to be on antiplatelet agent . Continue Imdur 30 mg every morning 4. COPD-former smoker quit 2002 -continue duo neb 3 times a day  5. Barrett's esophagus and history diverticulitis -outpatient followup-currently stable 6. Rheumatoid arthritis -continue Plaquenil 200  twice a day , sulfasalazine 500 3 times a day 7. Constipation-continue Dulcolax suppository, Colace 100 twice a day, Linzess 290 every morning 8. Reflux-continue omeprazole 20 every morning  9. Probable bipolar 1-continue Lexapro 5 mg every afternoon 10. Multiple orthopedic issues-hip surgery 03/2012, prior L4-5 stenosis-currently stable. He management with IV pain meds for current issue  11. Diabetes mellitus -not currently on controlling medications.  Monitor a.m. blood sugar  12. Hypertension-see #3    Code Status: Full  Family Communication:  Likely will need intensive therapy subsequent to discharge-KLives at a care facility run by sister where she can get therapy Disposition Plan: inpt   Pleas Koch, MD  [facility] Pager [phone] 12/04/2013, 9:29 AM    LOS: 3 days

## 2013-12-04 NOTE — Consult Note (Signed)
Pleasant but fragile elderly woman fully anticoagulated for history of DVT.  Has a tender mobile mass on anterior abdominal wall a long right subcostal ridge.  No area of fluctuance.  History of trip and fall on stomach 3 weeks ago.  She says this is the best she has felt in 3 weeks.  However she has rib fractures and spine fractures and other pains.  Has a lot of chronic pain and discomfort.  To my examination, she is an abdominal wall mass somewhat fixed.  Mildly tender.  No phlegmon or inflammation.  No cellulitis.  Correllates with slightly thicker rectus muscle.  I suspect she has a small rectus wall hematoma.  Discussed with Dr. Rito Ehrlich with radiology.  She agrees.  This should gradually resolve over time.  Heat.  Lidoderm patches.  Tylenol RTC.  Tramadol.  It is safe to position the patient on this as tolerated.  Defer to orthopedics and primary service on its more intense analgesia/sedation as needed to help treat her spinal issues.  Ardeth Sportsman, M.D., F.A.C.S. Gastrointestinal and Minimally Invasive Surgery [facility] [address] [place], [place] [address] [phone] Main / Paging

## 2013-12-04 NOTE — Progress Notes (Signed)
ANTICOAGULATION CONSULT NOTE - Follow Up Consult  Pharmacy Consult for Heparin Indication: VTE treatment  Allergies  Allergen Reactions  . Codeine Nausea And Vomiting, Swelling and Rash  . Hydrocodone Nausea And Vomiting, Swelling and Rash    "mouth peeled"  . Morphine And Related Other (See Comments)    hallucinations  . Other Nausea And Vomiting    Most pain meds! other than tramadol. Tylenol, Toradol, and Robaxin are ok.    . Penicillins Hives  . Darvocet [Propoxyphene N-Acetaminophen] Hives and Nausea And Vomiting  . Demerol [Meperidine] Nausea And Vomiting  . Statins Other (See Comments)    INTOLERANT OF STATINS - messes up her legs and has muscle aches  . Tape Other (See Comments)    Needs paper tape - skin very fragile    Patient Measurements: Height: 5\' 3"  (160 cm) Weight: 115 lb 8.3 oz (52.4 kg) IBW/kg (Calculated) : 52.4  Vital Signs: Temp: 98.4 F (36.9 C) (10/15 0358) Temp Source: Oral (10/15 0358) BP: 173/80 mmHg (10/15 0931) Pulse Rate: 74 (10/15 0358)  Labs:  Recent Labs  12/01/13 1723  12/02/13 0640 12/02/13 0956 12/02/13 1555 12/02/13 2104  12/03/13 0540 12/03/13 1459 12/03/13 1937 12/04/13 0345 12/04/13 0350  HGB 11.6*  --  11.2*  --   --   --   --  10.2*  --   --  10.1*  --   HCT 35.4*  --  33.9*  --   --   --   --  30.7*  --   --  30.9*  --   PLT 208  --  204  --   --   --   --  172  --   --  180  --   LABPROT  --   < > 23.6*  --   --  22.7*  --  22.0*  --   --  18.2*  --   INR  --   < > 2.09*  --   --  1.98*  --  1.90*  --   --  1.50*  --   HEPARINUNFRC  --   --   --   --   --   --   < > 0.14* 0.27* 0.22*  --  0.29*  CREATININE 0.53  --  0.47*  --   --   --   --   --   --   --   --   --   TROPONINI  --   --  <0.30 <0.30 <0.30  --   --   --   --   --   --   --   < > = values in this interval not displayed.  Estimated Creatinine Clearance: 45.6 ml/min (by C-G formula based on Cr of 0.47).   Infusions:  . sodium chloride 10 mL/hr at  12/04/13 1057  . heparin 1,100 Units/hr (12/04/13 0615)    Assessment: 89 yoF with PMhx of angina, iron deficiency, HTN, HLD, COPD, DVT on coumadin, GERD, stroke, RA, OA, DJD presented to [facility] ED with main concern of several days duration of persistent abd discomfort, associated with cramping, poor oral intake, 5/10 in severity, with no specific alleviating or aggravating factors. Pharmacy consulted to start heparin when INR <2.0, heparin started 10/13 2200 with plans for IR to perform vertebroplasty when INR < 1.5   Today, 10/15  Heparin level:  0.49, therapeutic on heparin at 1100 units/hr.  CBC: Hgb = 10.1 (trending down), pltc  WNL   Latest INR = 1.5  RN reports no bleeding or complications.  No surgical plans yet.  Goal of Therapy:  Heparin level 0.3-0.7 units/ml Monitor platelets by anticoagulation protocol: Yes   Plan:   Continue heparin IV infusion at 1100 units/hr  Heparin level in 8 hours to confirm therapeutic level  Daily heparin level and CBC  Continue to monitor H&H and platelets  Follow up plan for resuming warfarin PO when appropriate.   Lynann Beaver PharmD, BCPS Pager [phone] 12/04/2013 3:03 PM

## 2013-12-04 NOTE — Progress Notes (Signed)
Pt refused her 14:00 neb tx. Pt in no distress at this time.

## 2013-12-04 NOTE — Progress Notes (Signed)
I have spoke with Patient and her daughter today and they would like to hold off on any back procedure until her abdominal pain is further investigated and improved because she will not be able to lay on the procedure table prone. I have also d/w Dr. Deanne Coffer the radiologist who is available this Friday at [facility] per patient's request and would be performing the VP/KP and he would recommend a MRI prior to procedure for concern at T10 as well as T9, this was discussed with the patient and her daughter today. They wish to hold off on MRI or any procedure until abdominal pain is further investigated.   Pattricia Boss PA-C Interventional Radiology  12/04/13  9:21 AM

## 2013-12-04 NOTE — Progress Notes (Signed)
ANTICOAGULATION CONSULT NOTE - Follow Up Consult  Pharmacy Consult for Heparin Indication: VTE Treatment  Allergies  Allergen Reactions  . Codeine Nausea And Vomiting, Swelling and Rash  . Hydrocodone Nausea And Vomiting, Swelling and Rash    "mouth peeled"  . Morphine And Related Other (See Comments)    hallucinations  . Other Nausea And Vomiting    Most pain meds! other than tramadol. Tylenol, Toradol, and Robaxin are ok.    . Penicillins Hives  . Darvocet [Propoxyphene N-Acetaminophen] Hives and Nausea And Vomiting  . Demerol [Meperidine] Nausea And Vomiting  . Statins Other (See Comments)    INTOLERANT OF STATINS - messes up her legs and has muscle aches  . Tape Other (See Comments)    Needs paper tape - skin very fragile    Patient Measurements: Height: 5\' 3"  (160 cm) Weight: 117 lb 3.2 oz (53.162 kg) IBW/kg (Calculated) : 52.4 Heparin Dosing Weight:   Vital Signs: Temp: 98.4 F (36.9 C) (10/15 0358) Temp Source: Oral (10/15 0358) BP: 181/63 mmHg (10/15 0358) Pulse Rate: 74 (10/15 0358)  Labs:  Recent Labs  12/01/13 1723  12/02/13 0640 12/02/13 0956 12/02/13 1555 12/02/13 2104  12/03/13 0540 12/03/13 1459 12/03/13 1937 12/04/13 0345 12/04/13 0350  HGB 11.6*  --  11.2*  --   --   --   --  10.2*  --   --  10.1*  --   HCT 35.4*  --  33.9*  --   --   --   --  30.7*  --   --  30.9*  --   PLT 208  --  204  --   --   --   --  172  --   --  180  --   LABPROT  --   < > 23.6*  --   --  22.7*  --  22.0*  --   --  18.2*  --   INR  --   < > 2.09*  --   --  1.98*  --  1.90*  --   --  1.50*  --   HEPARINUNFRC  --   --   --   --   --   --   < > 0.14* 0.27* 0.22*  --  0.29*  CREATININE 0.53  --  0.47*  --   --   --   --   --   --   --   --   --   TROPONINI  --   --  <0.30 <0.30 <0.30  --   --   --   --   --   --   --   < > = values in this interval not displayed.  Estimated Creatinine Clearance: 45.6 ml/min (by C-G formula based on Cr of 0.47).   Medications:   Infusions:  . sodium chloride 75 mL/hr at 12/03/13 1725  . heparin      Assessment: Patient with still low heparin level, but very close to goal.  No issues per RN.  Goal of Therapy:  Heparin level 0.3-0.7 units/ml Monitor platelets by anticoagulation protocol: Yes   Plan:  Increase heparin to 1100 units/hr Recheck level at 1400  [address] [place] 12/04/2013,5:15 AM

## 2013-12-04 NOTE — Progress Notes (Signed)
Spoke with pt concerning Home Health. Pt [facility] was selected. Will need HHRN/PT orders. Thanks.

## 2013-12-04 NOTE — Consult Note (Signed)
[facility] Consult Note  [name] May 14, 1930  [id number].    Requesting MD: Dr. Verlon Au Chief Complaint/Reason for Consult: Abdominal pain  HPI:  80 y/o F with PMhx of angina, iron deficiency, HTN, HLD, COPD, DVT on coumadin, GERD, stroke, RA, OA, DJD presented to [facility] ED with worsening persistent abdominal discomfort, associated with cramping, poor oral intake, 5/10 in severity, with no specific alleviating or aggravating factors.  She says the pain started 3 weeks ago after she fell on her bed.  She hit her right hip on the hospital bed rails (they were in the down position) and her chest and abdomen fell on the bed.  The pain is in the right upper abdomen below the ribs and it radiates around to her right flank.  She c/o back pain over her T9 compression fracture.  She denies bruising to the skin over this area after the fall.  She's having loose BM's and flatus.  No N/V.  She has tried tramadol and robaxin without relief. Pt reports she was last hospitalized in July 2015 regarding a fall that resulted in bilateral rib fractures and pubic fracture.  PT reports no fevers, no chills, no urinary incontinence, no CP/SOB.    She has been found to have a recent T9 compression fracture and is pending vertebroplasty.  Prior to proceeding with this procedure triad hospitalist wanted [place] to evaluate the abdominal pain to ensure she didn't have a hernia.     ROS: All systems reviewed and otherwise negative except for as above  Family History  Problem Relation Age of Onset  . Prostate cancer Father   . Diabetes Mother   . Colon cancer Neg Hx   . Breast cancer Mother   . Heart disease Mother     Past Medical History  Diagnosis Date  . History of  Angina     Treated with PCI in 2004; no active Symptoms.  . Hiatal hernia   . GERD (gastroesophageal reflux disease)   . Iron deficiency anemia   . Barrett esophagus   . Osteoporosis   . HLD (hyperlipidemia)     Statin intolerant   . Internal hemorrhoids   . Diverticulosis   . Pain     RIGHT SHOULDER WITH LIMITED ROM--STATES PREVIOUS RT TOTAL SHOULDDER REPLACEMENT-BUT STILL HAS PROBLEMS WITH SHOULDER  . CAD S/P percutaneous coronary angioplasty 2004    PCI-RCA (2.31m x 142mTaxus DES)& Cx (2.75 mm x 14 mm Cypher DES)  . PAD (peripheral artery disease) 1994    History of right iliac stenting, fem-fem bypass in 94; aortobifem bypass/ AAA repair - November 2004; latest ABIs of been roughly 1 bilaterally, followed by Dr. EaDonnetta Hutching. HTN (hypertension)     sometimes  . Shortness of breath     MILD- WITH EXERTION. chronic  . Nocturia   . DVT (deep venous thrombosis)     BLE  . COPD (chronic obstructive pulmonary disease)   . Pneumonia     "several times"  . History of blood transfusion     "not sure why"  . History of stomach ulcers   . Migraine     "used to have them; not anymore" (08/22/2013)  . Stroke     "showed up on XRAY" (08/22/2013)  . Osteoarthritis of both hips      status post multiple hip operations, recent fall with fracture.    . Marland KitchenJD (degenerative joint disease)   . Rheumatoid arthritis   . Fall from  slip, trip, or stumble 08/20/2013    Past Surgical History  Procedure Laterality Date  . Back surgery      x 2  . Appendectomy  1950  . Total shoulder replacement Right 2011  . Cholecystectomy      several yrs ago  . Cataract extraction w/ intraocular lens  implant, bilateral Bilateral 2001-2002  . Total abdominal hysterectomy  1965  . Upper gastrointestinal endoscopy  multiple    w/bx, hiatal hernia, Barretts', esophageal polyp 1x  . Colonoscopy  05/09/2004; 06/08/1999    internal hemorrhoids; diverticulosis  . Shoulder open rotator cuff repair Bilateral     2 times on left  . Lumbar spine surgery  October 2012    spinal stenosis, Dr. Lorin Mercy  . Abdominal aortic aneurysm repair  2004    AortoBi-Fem Bypass  . Carpal tunnel release Left 04/2011  . Total hip arthroplasty  01/26/2012    Procedure:  TOTAL HIP ARTHROPLASTY ANTERIOR APPROACH;  Surgeon: Mcarthur Rossetti, MD;  Location: [facility];  Service: Orthopedics;  Laterality: Left;  Left Total Hip Arthroplasty, Anterior Approach (C-Arm)  . Intramedullary (im) nail intertrochanteric Right 04/12/2012    Procedure: INTRAMEDULLARY (IM) NAIL INTERTROCHANTRIC Right;  Surgeon: Mcarthur Rossetti, MD;  Location: [place];  Service: Orthopedics;  Laterality: Right;  . Doppler echocardiography  06/06/2012    EF 50-60%.  Mild LVH, grade one diastolic function; normal PA pressures and CVP  . Lower extremity venous dopplers  08/16/2012    Partially occlusive right femoral vein thrombosis, also noted in right peroneal vein; no thrombosis in left sided veins.  . Nm myoview ltd  April 2014    Bowel artifact, No ischemia or infarction  . Femoral-femoral bypass graft  1994    Following right iliac stenting  . Doppler echocardiography  06/06/2012    Mild LVH, EF 55-60%.  Normal wall motion.  Mildly elevated LVEDP with grade one diastolic function.  Mild LA dilatation.  Normal PA pressures.  . Tubal ligation  1956  . Excision/release bursa hip Right 12/27/2012    Procedure: RIGHT HIP BURSECTOMY AND COMPRESSION SCREW REMOVAL;  Surgeon: Mcarthur Rossetti, MD;  Location: [facility];  Service: Orthopedics;  Laterality: Right;  . Joint replacement    . Coronary angioplasty with stent placement  2004    RCA: Taxus DES 2.5 mm x 12 mm; Circumflex: Cypher DES 2.75 mm 14 mm  . Cardiac catheterization  October 2005    Patent RCA stent, 20% ISR of Cx stent; EF 50-55%    Social History:  reports that she quit smoking about 13 years ago. Her smoking use included Cigarettes. She has a 50 pack-year smoking history. She has never used smokeless tobacco. She reports that she does not drink alcohol or use illicit drugs.  Allergies:  Allergies  Allergen Reactions  . Codeine Nausea And Vomiting, Swelling and Rash  . Hydrocodone Nausea And Vomiting, Swelling and Rash     "mouth peeled"  . Morphine And Related Other (See Comments)    hallucinations  . Other Nausea And Vomiting    Most pain meds! other than tramadol. Tylenol, Toradol, and Robaxin are ok.    . Penicillins Hives  . Darvocet [Propoxyphene N-Acetaminophen] Hives and Nausea And Vomiting  . Demerol [Meperidine] Nausea And Vomiting  . Statins Other (See Comments)    INTOLERANT OF STATINS - messes up her legs and has muscle aches  . Tape Other (See Comments)    Needs paper tape - skin very  fragile    Medications Prior to Admission  Medication Sig Dispense Refill  . Calcium Carbonate-Vitamin D (CALTRATE 600+D) 600-400 MG-UNIT per tablet Take 1 tablet by mouth every evening.       . Camphor-Menthol-Methyl Sal (SALONPAS) 1.2-5.7-6.3 % PTCH Place 1 patch onto the skin daily as needed (For pain.).      [name] cholecalciferol (VITAMIN D) 1000 UNITS tablet Take 2,000 Units by mouth every morning.      . docusate sodium (COLACE) 100 MG capsule Take 100 mg by mouth 2 (two) times daily.       [name] escitalopram (LEXAPRO) 5 MG tablet Take 5 mg by mouth every evening.       . Flaxseed, Linseed, (FLAX SEED OIL PO) Take 1 tablet by mouth daily at 12 noon.       . hydroxychloroquine (PLAQUENIL) 200 MG tablet Take 200 mg by mouth 2 (two) times daily.       . isosorbide mononitrate (IMDUR) 30 MG 24 hr tablet Take 30 mg by mouth every morning.      . Linaclotide (LINZESS) 145 MCG CAPS capsule Take 290 mcg by mouth every morning.       . magnesium citrate SOLN Take 1 Bottle by mouth once.      . methocarbamol (ROBAXIN) 500 MG tablet Take 1 tablet (500 mg total) by mouth 3 (three) times daily as needed (For muscle spasms.).  40 tablet  0  . Multiple Vitamin (MULTIVITAMIN WITH MINERALS) TABS tablet Take 1 tablet by mouth daily at 12 noon.      [name] omeprazole (PRILOSEC OTC) 20 MG tablet Take 20 mg by mouth every morning.       . polyvinyl alcohol (LIQUIFILM TEARS) 1.4 % ophthalmic solution Place 1 drop into both eyes as  needed (Dry eyes.).  15 mL    . promethazine (PHENERGAN) 12.5 MG tablet Take 12.5 mg by mouth every 6 (six) hours as needed for nausea (Patient takes with Tramadol).      [name] sulfaSALAzine (AZULFIDINE) 500 MG tablet Take 500 mg by mouth 3 (three) times daily.       . traMADol (ULTRAM) 50 MG tablet Take 2 tablets (100 mg total) by mouth every 6 (six) hours as needed for moderate pain.  30 tablet  0  . warfarin (COUMADIN) 3 MG tablet Take 1 tablet (3 mg total) by mouth See admin instructions. States takes Monday, Wednesday 4.5 mg Tuesday, Thursday Friday Sat Sun 3 mg. Get your INR checked by PCP in 1 day.      [name] glucose monitoring kit (FREESTYLE) monitoring kit 1 each by Does not apply route 4 (four) times daily - after meals and at bedtime. 1 month Diabetic Testing Supplies for QAC-QHS accuchecks.Any brand OK  1 each  1    Blood pressure 173/80, pulse 74, temperature 98.4 F (36.9 C), temperature source Oral, resp. rate 18, height _0  (1.6 m), weight 115 lb 8.3 oz (52.4 kg), SpO2 98.00%. Physical Exam: General: pleasant, WD/WN white female who is laying in bed in NAD HEENT: head is normocephalic, atraumatic.  Sclera are noninjected.  PERRL.  Ears and nose without any masses or lesions.  Mouth is pink and moist Heart: regular, rate, and rhythm.  No obvious murmurs, gallops, or rubs noted.  Palpable pedal pulses bilaterally Lungs: CTAB, no wheezes, rhonchi, or rales noted.  Respiratory effort nonlabored Abd: soft, ND, +BS, no masses, hernias, or organomegaly, soft tissue bulge in the right mid abdomen semi-lunar line just  superior to the level of the umbilicus, bulge is not reducible, somewhat movable from side to side.    MS: all 4 extremities are symmetrical with no cyanosis, clubbing, or edema. Skin: warm and dry with no masses, lesions, or rashes Psych: A&Ox3 with an appropriate affect.   Results for orders placed during the hospital encounter of 12/01/13 (from the past 48 hour(s))  TROPONIN I      Status: None   Collection Time    12/02/13  3:55 PM      Result Value Ref Range   Troponin I <0.30  <0.30 ng/mL   Comment:            Due to the release kinetics of cTnI,     a negative result within the first hours     of the onset of symptoms does not rule out     myocardial infarction with certainty.     If myocardial infarction is still suspected,     repeat the test at appropriate intervals.  PROTIME-INR     Status: Abnormal   Collection Time    12/02/13  9:04 PM      Result Value Ref Range   Prothrombin Time 22.7 (*) 11.6 - 15.2 seconds   INR 1.98 (*) 0.00 - 1.49  PROTIME-INR     Status: Abnormal   Collection Time    12/03/13  5:40 AM      Result Value Ref Range   Prothrombin Time 22.0 (*) 11.6 - 15.2 seconds   INR 1.90 (*) 0.00 - 1.49  HEPARIN LEVEL (UNFRACTIONATED)     Status: Abnormal   Collection Time    12/03/13  5:40 AM      Result Value Ref Range   Heparin Unfractionated 0.14 (*) 0.30 - 0.70 IU/mL   Comment:            IF HEPARIN RESULTS ARE BELOW     EXPECTED VALUES, AND PATIENT     DOSAGE HAS BEEN CONFIRMED,     SUGGEST FOLLOW UP TESTING     OF ANTITHROMBIN III LEVELS.  CBC     Status: Abnormal   Collection Time    12/03/13  5:40 AM      Result Value Ref Range   WBC 4.4  4.0 - 10.5 K/uL   RBC 3.49 (*) 3.87 - 5.11 MIL/uL   Hemoglobin 10.2 (*) 12.0 - 15.0 g/dL   HCT 30.7 (*) 36.0 - 46.0 %   MCV 88.0  78.0 - 100.0 fL   MCH 29.2  26.0 - 34.0 pg   MCHC 33.2  30.0 - 36.0 g/dL   RDW 14.1  11.5 - 15.5 %   Platelets 172  150 - 400 K/uL  HEPARIN LEVEL (UNFRACTIONATED)     Status: Abnormal   Collection Time    12/03/13  2:59 PM      Result Value Ref Range   Heparin Unfractionated 0.27 (*) 0.30 - 0.70 IU/mL   Comment:            IF HEPARIN RESULTS ARE BELOW     EXPECTED VALUES, AND PATIENT     DOSAGE HAS BEEN CONFIRMED,     SUGGEST FOLLOW UP TESTING     OF ANTITHROMBIN III LEVELS.  HEPARIN LEVEL (UNFRACTIONATED)     Status: Abnormal   Collection Time     12/03/13  7:37 PM      Result Value Ref Range   Heparin Unfractionated 0.22 (*) 0.30 - 0.70 IU/mL  Comment:            IF HEPARIN RESULTS ARE BELOW     EXPECTED VALUES, AND PATIENT     DOSAGE HAS BEEN CONFIRMED,     SUGGEST FOLLOW UP TESTING     OF ANTITHROMBIN III LEVELS.  PROTIME-INR     Status: Abnormal   Collection Time    12/04/13  3:45 AM      Result Value Ref Range   Prothrombin Time 18.2 (*) 11.6 - 15.2 seconds   INR 1.50 (*) 0.00 - 1.49  CBC     Status: Abnormal   Collection Time    12/04/13  3:45 AM      Result Value Ref Range   WBC 4.8  4.0 - 10.5 K/uL   RBC 3.46 (*) 3.87 - 5.11 MIL/uL   Hemoglobin 10.1 (*) 12.0 - 15.0 g/dL   HCT 30.9 (*) 36.0 - 46.0 %   MCV 89.3  78.0 - 100.0 fL   MCH 29.2  26.0 - 34.0 pg   MCHC 32.7  30.0 - 36.0 g/dL   RDW 14.1  11.5 - 15.5 %   Platelets 180  150 - 400 K/uL  HEPARIN LEVEL (UNFRACTIONATED)     Status: Abnormal   Collection Time    12/04/13  3:50 AM      Result Value Ref Range   Heparin Unfractionated 0.29 (*) 0.30 - 0.70 IU/mL   Comment:            IF HEPARIN RESULTS ARE BELOW     EXPECTED VALUES, AND PATIENT     DOSAGE HAS BEEN CONFIRMED,     SUGGEST FOLLOW UP TESTING     OF ANTITHROMBIN III LEVELS.   No results found.    Assessment/Plan Right rectus abdominus pain:  DD:  Traumatic rectus hematoma, lipoma, seroma, referred pain from thoracic spine, or spigelian hernia T9 compression fracture pending vertebroplasty Anticoagulation for h/o DVT on coumadin H/o CAD, COPD, Barretts esophagus, diverticulitis, RA, constipation, GERD, probably bipolar type 1, multiple orthopedic issures, diabetes mellitus, and HTN  Plan: 1.  Continue conservative management with rest, ice/heat, lidoderm patches, muscle relaxer's, and pain control 2.  Await Dr. Clyda Greener evaluation, but I do not see a hernia on exam or on CT.  She is not obstructed, she is having good bowel function in the form of BM's and flatus, no N/V.   3.  SCD's and  heparin for DVT proph 4.  Ambulate as able to aid in recovery and prevent deconditioning   Coralie Keens, [facility] Surgery 12/04/2013, 11:40 AM Pager: [phone]

## 2013-12-04 NOTE — Progress Notes (Signed)
Physical Therapy Treatment Patient Details Name: [name] MRN: [id number] DOB: 1930/11/08 Today's Date: 12/04/2013    History of Present Illness 81 y/o Fadmitted 12/01/13 with PMhx of angina, iron deficiency, HTN, HLD, COPD, DVT on coumadin, GERD, stroke, RA, OA, DJD presented to [facility] ED with main concern of several days duration of persistent abd discomfort, associated with cramping, poor oral intake. [name] Recent T9 compression fracture, being concidered for Kyphoplasty.Evaluated for abdominal pain and thought to be possible rectus hematoma.     PT Comments    Patient continues to c/o abdominal pain. Noted GI consult  Suggested rectus hematoma. Per family member, patient lives in  [place] "family Home" type  Setting and plans to return there with HHPT.  Follow Up Recommendations  Home health PT;Supervision/Assistance - 24 hour     Equipment Recommendations  None recommended by PT    Recommendations for Other Services OT consult     Precautions / Restrictions Precautions Precautions: Fall;Back    Mobility  Bed Mobility Overal bed mobility: Needs Assistance Bed Mobility: Rolling;Sidelying to Sit;Sit to Supine Rolling: Min assist Sidelying to sit: Min assist Supine to sit: Min assist Sit to supine: Min assist   General bed mobility comments: cues for rolling, back precautions, support of trunk to upright position to decrease pain in abdomen  Transfers Overall transfer level: Needs assistance Equipment used: 1 person hand held assist Transfers: Sit to/from [employer] Sit to Stand: Mod assist Stand pivot transfers: Mod assist       General transfer comment: assisted to recliner, pt not comfortable  and had medication for pain, family felt best for pt. to return to bed.  Ambulation/Gait                 Stairs            Wheelchair Mobility    Modified Rankin (Stroke Patients Only)       Balance                                     Cognition Arousal/Alertness: Awake/alert Behavior During Therapy: Anxious Overall Cognitive Status: Within Functional Limits for tasks assessed                      Exercises      General Comments        Pertinent Vitals/Pain Pain Score: 8  Pain Location: R to mid abdomen Pain Descriptors / Indicators: Aching;Cramping    Home Living                 Additional Comments: Lives in  "family group Home" setting run by pt's sister.    Prior Function            PT Goals (current goals can now be found in the care plan section) Progress towards PT goals: Not progressing toward goals - comment (continues to have abd. pain > back)    Frequency  Min 3X/week    PT Plan Current plan remains appropriate    Co-evaluation             End of Session   Activity Tolerance: Patient limited by pain Patient left: in bed;with call bell/phone within reach;with family/visitor present     Time: 1123-1140 PT Time Calculation (min): 17 min  Charges:  $Therapeutic Activity: 8-22 mins  G Codes:      Rada Hay 12/04/2013, 3:36 PM Blanchard Kelch PT [phone]

## 2013-12-04 NOTE — Progress Notes (Signed)
ANTICOAGULATION CONSULT NOTE - Follow Up Consult  Pharmacy Consult for Heparin Indication: VTE treatment  Allergies  Allergen Reactions  . Codeine Nausea And Vomiting, Swelling and Rash  . Hydrocodone Nausea And Vomiting, Swelling and Rash    "mouth peeled"  . Morphine And Related Other (See Comments)    hallucinations  . Other Nausea And Vomiting    Most pain meds! other than tramadol. Tylenol, Toradol, and Robaxin are ok.    . Penicillins Hives  . Demerol [Meperidine] Nausea And Vomiting  . Propoxyphene Hives and Nausea And Vomiting  . Statins Other (See Comments)    INTOLERANT OF STATINS - messes up her legs and has muscle aches  . Tape Other (See Comments)    Needs paper tape - skin very fragile    Patient Measurements: Height: 5\' 3"  (160 cm) Weight: 115 lb 8.3 oz (52.4 kg) IBW/kg (Calculated) : 52.4  Vital Signs: Temp: 98.4 F (36.9 C) (10/15 1359) Temp Source: Oral (10/15 1359) BP: 125/52 mmHg (10/15 1359) Pulse Rate: 72 (10/15 1359)  Labs:  Recent Labs  12/02/13 0640 12/02/13 0956 12/02/13 1555 12/02/13 2104 12/03/13 0540  12/03/13 1937 12/04/13 0345 12/04/13 0350 12/04/13 1350  HGB 11.2*  --   --   --  10.2*  --   --  10.1*  --   --   HCT 33.9*  --   --   --  30.7*  --   --  30.9*  --   --   PLT 204  --   --   --  172  --   --  180  --   --   LABPROT 23.6*  --   --  22.7* 22.0*  --   --  18.2*  --   --   INR 2.09*  --   --  1.98* 1.90*  --   --  1.50*  --   --   HEPARINUNFRC  --   --   --   --  0.14*  < > 0.22*  --  0.29* 0.49  CREATININE 0.47*  --   --   --   --   --   --   --   --   --   TROPONINI <0.30 <0.30 <0.30  --   --   --   --   --   --   --   < > = values in this interval not displayed.  Estimated Creatinine Clearance: 45.6 ml/min (by C-G formula based on Cr of 0.47).   Infusions:  . sodium chloride 10 mL/hr at 12/04/13 1057    Assessment: 89 yoF with PMhx of angina, iron deficiency, HTN, HLD, COPD, DVT on coumadin, GERD, stroke,  RA, OA, DJD presented to [facility] ED with main concern of several days duration of persistent abd discomfort, associated with cramping, poor oral intake, 5/10 in severity, with no specific alleviating or aggravating factors. Pharmacy consulted to start heparin when INR <2.0, heparin started 10/13 2200 with plans for IR to perform vertebroplasty when INR < 1.5   Today, 10/15  Heparin level:  0.49, therapeutic on heparin at 1100 units/hr.  CBC: Hgb = 10.1 (trending down), pltc WNL   Latest INR = 1.5  RN reports no bleeding or complications.  No surgical plans yet.  Goal of Therapy:  Dr 11/15 would like goal to be at lower range (Heparin level 0.3-0.5 units/ml) due to presence of hematoma.  Monitor platelets by anticoagulation protocol: Yes  Plan:   Decrease heparin IV infusion to 1000 units/hr  Heparin level in 8 hours to confirm lower therapeutic level  Daily heparin level and CBC  Continue to monitor H&H and platelets   Loma Boston, PharmD Pager: [phone] 12/04/2013 6:55 PM

## 2013-12-04 NOTE — Progress Notes (Signed)
Patients granddaughter at bedside at this time and wanted to let staff know that she chooses [facility] for patient when she leaves. Please call granddaughter with all updates related to care. POA papers in chart. Will continue to monitor patient.Setzer, Don Broach

## 2013-12-05 ENCOUNTER — Inpatient Hospital Stay (HOSPITAL_COMMUNITY): Payer: Medicare Other

## 2013-12-05 LAB — PROTIME-INR
INR: 1.35 (ref 0.00–1.49)
Prothrombin Time: 16.8 seconds — ABNORMAL HIGH (ref 11.6–15.2)

## 2013-12-05 LAB — HEPARIN LEVEL (UNFRACTIONATED): HEPARIN UNFRACTIONATED: 0.28 [IU]/mL — AB (ref 0.30–0.70)

## 2013-12-05 MED ORDER — HYDRALAZINE HCL 20 MG/ML IJ SOLN
5.0000 mg | Freq: Four times a day (QID) | INTRAMUSCULAR | Status: DC | PRN
  Administered 2013-12-05 – 2013-12-08 (×2): 5 mg via INTRAVENOUS
  Filled 2013-12-05 (×2): qty 1

## 2013-12-05 MED ORDER — HEPARIN (PORCINE) IN NACL 100-0.45 UNIT/ML-% IJ SOLN
1050.0000 [IU]/h | INTRAMUSCULAR | Status: DC
Start: 2013-12-05 — End: 2013-12-05
  Filled 2013-12-05: qty 250

## 2013-12-05 MED ORDER — ACETAMINOPHEN 325 MG PO TABS
650.0000 mg | ORAL_TABLET | Freq: Four times a day (QID) | ORAL | Status: DC | PRN
  Filled 2013-12-05: qty 2

## 2013-12-05 MED ORDER — HYDROCODONE-ACETAMINOPHEN 10-325 MG PO TABS: 1.0000 | ORAL_TABLET | ORAL | Status: DC | PRN

## 2013-12-05 MED ORDER — LISINOPRIL 10 MG PO TABS
10.0000 mg | ORAL_TABLET | Freq: Every day | ORAL | Status: DC
  Administered 2013-12-05 – 2013-12-09 (×5): 10 mg via ORAL
  Filled 2013-12-05 (×7): qty 1

## 2013-12-05 MED ORDER — WARFARIN SODIUM 5 MG PO TABS
5.0000 mg | ORAL_TABLET | Freq: Once | ORAL | Status: AC
  Administered 2013-12-05: 5 mg via ORAL
  Filled 2013-12-05: qty 1

## 2013-12-05 MED ORDER — WARFARIN - PHARMACIST DOSING INPATIENT: Freq: Every day | Status: DC

## 2013-12-05 MED ORDER — HYDROMORPHONE HCL 2 MG PO TABS: 2.0000 mg | ORAL_TABLET | ORAL | Status: DC | PRN

## 2013-12-05 MED ORDER — DICLOFENAC EPOLAMINE 1.3 % TD PTCH
1.0000 | MEDICATED_PATCH | Freq: Two times a day (BID) | TRANSDERMAL | Status: DC
  Administered 2013-12-05 – 2013-12-10 (×10): 1 via TRANSDERMAL
  Filled 2013-12-05 (×22): qty 1

## 2013-12-05 NOTE — Progress Notes (Signed)
PT Cancellation Note  ___Treatment cancelled today due to medical issues with patient which prohibited therapy  _X_ Treatment cancelled today due to patient receiving procedure or test ......... Out of room MRI  ___ Treatment cancelled today due to patient's refusal to participate   ___ Treatment cancelled today due to  Felecia Shelling  PTA [facility]  Acute  Rehab Pager      [phone]

## 2013-12-05 NOTE — Progress Notes (Signed)
ANTICOAGULATION CONSULT NOTE - Follow Up Consult  Pharmacy Consult for Warfarin Indication: VTE treatment  Allergies  Allergen Reactions  . Codeine Nausea And Vomiting, Swelling and Rash  . Hydrocodone Nausea And Vomiting, Swelling and Rash    "mouth peeled"  . Morphine And Related Other (See Comments)    hallucinations  . Other Nausea And Vomiting    Most pain meds! other than tramadol. Tylenol, Toradol, and Robaxin are ok.    . Penicillins Hives  . Demerol [Meperidine] Nausea And Vomiting  . Propoxyphene Hives and Nausea And Vomiting  . Statins Other (See Comments)    INTOLERANT OF STATINS - messes up her legs and has muscle aches  . Tape Other (See Comments)    Needs paper tape - skin very fragile    Patient Measurements: Height: 5\' 3"  (160 cm) Weight: 118 lb 14.4 oz (53.933 kg) IBW/kg (Calculated) : 52.4  Vital Signs: Temp: 97.9 F (36.6 C) (10/16 0513) Temp Source: Oral (10/16 0513) BP: 164/52 mmHg (10/16 0758) Pulse Rate: 71 (10/16 0513)  Labs:  Recent Labs  12/02/13 1555  12/03/13 0540  12/04/13 0345 12/04/13 0350 12/04/13 1350 12/05/13 0255  HGB  --   --  10.2*  --  10.1*  --   --   --   HCT  --   --  30.7*  --  30.9*  --   --   --   PLT  --   --  172  --  180  --   --   --   LABPROT  --   < > 22.0*  --  18.2*  --   --  16.8*  INR  --   < > 1.90*  --  1.50*  --   --  1.35  HEPARINUNFRC  --   --  0.14*  < >  --  0.29* 0.49 0.28*  TROPONINI <0.30  --   --   --   --   --   --   --   < > = values in this interval not displayed.  Estimated Creatinine Clearance: 45.6 ml/min (by C-G formula based on Cr of 0.47).   Infusions:  . sodium chloride 10 mL/hr at 12/04/13 1057    Assessment: 73 yoF presented to ED on 10/12 with abdominal pain, cramping, poor PO intake.  PMH includes angina, iron deficiency, HTN, HLD, COPD, DVT on warfarin, GERD, stroke, RA, OA, DJD.  Warfarin dose PTA was 3mg  daily, except 4.5mg  on Mon/Wed; therapeutic INR on admission.   Warfarin held for possible procedure.  Pharmacy consulted to start heparin when INR <2.0.  With no further plans for procedure, pharmacy is consulted to resume warfarin dosing on 10/16.  Today, 10/16  CBC: Hgb = 10.2 (low, stable) and Plt WNL.     INR = 1.35  RN reports no bleeding or complications.  Heparin level < 0.1 as Heparin drip was d/c by MD.  Per MD, do NOT bridge with parenteral anticoagulation d/t hematoma, despite subtherapeutic INR.  Goal of Therapy:  Heparin level 0.3-0.7 units/ml Monitor platelets by anticoagulation protocol: Yes   Plan:   Warfarin 5mg  PO today.  Boosted dose today after holding x 3 days  Daily INR and CBC  Continue to monitor H&H and platelets   11/16 PharmD, BCPS Pager [phone] 12/05/2013 11:26 AM

## 2013-12-05 NOTE — Care Management Note (Signed)
    Page 1 of 1   12/05/2013     4:11:49 PM CARE MANAGEMENT NOTE 12/05/2013  Patient:  [name], [name]   Account Number:  [id number]  Date Initiated:  12/02/2013  Documentation initiated by:  Ezekiel Ina  Subjective/Objective Assessment:   Pt admitted with cco compression fx T9     Action/Plan:   from home   Anticipated DC Date:  12/06/2013   Anticipated DC Plan:  HOME W HOME HEALTH SERVICES      DC Planning Services  CM consult      Choice offered to / List presented to:  C-1 Patient        HH arranged  HH-2 PT      [facility] agency  [facility]   Status of service:  In process, will continue to follow Medicare Important Message given?  YES (If response is "NO", the following Medicare IM given date fields will be blank) Date Medicare IM given:  12/04/2013 Medicare IM given by:  Ezekiel Ina Date Additional Medicare IM given:   Additional Medicare IM given by:    Discharge Disposition:    Per UR Regulation:  Reviewed for med. necessity/level of care/duration of stay  If discussed at Long Length of Stay Meetings, dates discussed:    Comments:  12/04/13 MMcGibboney, RN, BSN Spoke with pt concerning Home Health. Pt [facility] was selected. Will need HHRN/PT orders. Thanks.   12/02/13 MMcGibboney, RN, BSN Chart reviewed.

## 2013-12-05 NOTE — Progress Notes (Signed)
ANTICOAGULATION CONSULT NOTE - Follow Up Consult  Pharmacy Consult for Heparin Indication: VTE Treatment  Allergies  Allergen Reactions  . Codeine Nausea And Vomiting, Swelling and Rash  . Hydrocodone Nausea And Vomiting, Swelling and Rash    "mouth peeled"  . Morphine And Related Other (See Comments)    hallucinations  . Other Nausea And Vomiting    Most pain meds! other than tramadol. Tylenol, Toradol, and Robaxin are ok.    . Penicillins Hives  . Demerol [Meperidine] Nausea And Vomiting  . Propoxyphene Hives and Nausea And Vomiting  . Statins Other (See Comments)    INTOLERANT OF STATINS - messes up her legs and has muscle aches  . Tape Other (See Comments)    Needs paper tape - skin very fragile    Patient Measurements: Height: 5\' 3"  (160 cm) Weight: 118 lb 14.4 oz (53.933 kg) IBW/kg (Calculated) : 52.4 Heparin Dosing Weight:   Vital Signs: Temp: 97.9 F (36.6 C) (10/16 0513) Temp Source: Oral (10/16 0513) BP: 189/72 mmHg (10/16 0513) Pulse Rate: 71 (10/16 0513)  Labs:  Recent Labs  12/02/13 0640 12/02/13 0956 12/02/13 1555  12/03/13 0540  12/04/13 0345 12/04/13 0350 12/04/13 1350 12/05/13 0255  HGB 11.2*  --   --   --  10.2*  --  10.1*  --   --   --   HCT 33.9*  --   --   --  30.7*  --  30.9*  --   --   --   PLT 204  --   --   --  172  --  180  --   --   --   LABPROT 23.6*  --   --   < > 22.0*  --  18.2*  --   --  16.8*  INR 2.09*  --   --   < > 1.90*  --  1.50*  --   --  1.35  HEPARINUNFRC  --   --   --   --  0.14*  < >  --  0.29* 0.49 0.28*  CREATININE 0.47*  --   --   --   --   --   --   --   --   --   TROPONINI <0.30 <0.30 <0.30  --   --   --   --   --   --   --   < > = values in this interval not displayed.  Estimated Creatinine Clearance: 45.6 ml/min (by C-G formula based on Cr of 0.47).   Medications:  Infusions:  . sodium chloride 10 mL/hr at 12/04/13 1057  . heparin      Assessment: Patient with heparin level below goal (adjusted to  0.3-0.5), no issues per RN.  Goal of Therapy:  Heparin level 0.3-0.5 units/ml Monitor platelets by anticoagulation protocol: Yes   Plan:  Increase heparin to 1050 units/hr Recheck level at 1400.  12/06/13, [name] Crowford 12/05/2013,5:48 AM

## 2013-12-05 NOTE — Progress Notes (Signed)
[name] TDD:[id number] DOB: [birth year] DOA: 12/01/2013 PCP: Hoyle Sauer, MD  Brief narrative: 80 y/o ? h/o R sided chronic DTt's on Lifelong coumadin, CAd s/p PCI RCA 2004, COPD, Prediabetes base don A1c 08/2013, Htn, GERD, Diverticular disease, Rh Arthritis on plaquenil, Barrett's esophagus, PAD, Fe def anemia-Hip surgery 03/2012 with removal painful Hardware R hip, HLDwith statin inteolerance, Abdominal aortic aneurysm, prior L4-5 stenosis admitted 12/02/13 with  H/o fall and resulting back pain 3 weeks pta.  She does have some difficulty remembering when she fell off and on one side but thinks it was in her right. She was admitted and found to have significant abdominal pain potentially secondary to rectus abdominus bruising with a hematoma palpable in the right rectus abdominis IR was consulted for evaluation of her T9/T10 pain and potential vertebroplasty but patient has been able to lay on to stomach because of rectus abdominis pain  Past medical history-As per Problem list Chart reviewed as below- Reviewed  Consultants:  Interventional radiology  Procedures:  CT scan abdomen pelvis, CT angiogram abdomen pelvis  Antibiotics:  None   Subjective   Pain continues. States pain is about 9/10. Able to move her a little better than previously but not able to lay on abdomen for procedure Tolerating some diet No confusion or other noted issues   Objective    Interim History: Reviewed  Telemetry: Sinus rhythm/sinus tach   Objective: Filed Vitals:   12/05/13 0730 12/05/13 0734 12/05/13 0758 12/05/13 1523  BP: 199/86  164/52 137/69  Pulse:    73  Temp:    98.6 F (37 C)  TempSrc:    Oral  Resp:    20  Height:      Weight:      SpO2:  100%  95%    Intake/Output Summary (Last 24 hours) at 12/05/13 1558 Last data filed at 12/05/13 1300  Gross per 24 hour  Intake 2493.94 ml  Output      0 ml  Net 2493.94 ml    Exam:  General: Alert frail pleasant  Caucasian female, mildly confused with regards to sequence of events Cardiovascular: S1-S2 no murmur rub or gallop Respiratory: Clinically clear no added sound Abdomen: Tender under the middle abdomen just below the 12th rib right side there seems to be an ovoid swelling which is discrete in the abdomen and tender to touch Skin see above Neuro 5/5 power throughout, sensory grossly throughout normal  Data Reviewed: Basic Metabolic Panel:  Recent Labs Lab 12/01/13 1723 12/02/13 0640  NA 134* 135*  K 4.5 3.9  CL 96 99  CO2 25 25  GLUCOSE 104* 114*  BUN 8 5*  CREATININE 0.53 0.47*  CALCIUM 9.2 8.9   Liver Function Tests:  Recent Labs Lab 12/01/13 1723  AST 22  ALT 12  ALKPHOS 91  BILITOT 0.3  PROT 6.8  ALBUMIN 3.5    Recent Labs Lab 12/01/13 1730  LIPASE 24   No results found for this basename: AMMONIA,  in the last 168 hours CBC:  Recent Labs Lab 12/01/13 1723 12/02/13 0640 12/03/13 0540 12/04/13 0345  WBC 5.6 5.7 4.4 4.8  NEUTROABS 3.5  --   --   --   HGB 11.6* 11.2* 10.2* 10.1*  HCT 35.4* 33.9* 30.7* 30.9*  MCV 87.6 86.7 88.0 89.3  PLT 208 204 172 180   Cardiac Enzymes:  Recent Labs Lab 12/02/13 0640 12/02/13 0956 12/02/13 1555  TROPONINI <0.30 <0.30 <0.30   BNP: No components  found with this basename: POCBNP,  CBG: No results found for this basename: GLUCAP,  in the last 168 hours  Recent Results (from the past 240 hour(s))  URINE CULTURE     Status: None   Collection Time    12/01/13 10:23 PM      Result Value Ref Range Status   Specimen Description URINE, CLEAN CATCH   Final   Special Requests NONE   Final   Culture  Setup Time     Final   Value: 12/03/2013 01:27     Performed at [facility] Count     Final   Value: >=100,000 COLONIES/ML     Performed at [facility]   Culture     Final   Value: Multiple bacterial morphotypes present, none predominant. Suggest appropriate recollection if clinically  indicated.     Performed at [facility]   Report Status 12/03/2013 FINAL   Final     Studies:              All Imaging reviewed and is as per above notation   Scheduled Meds: . diclofenac  1 patch Transdermal BID  . docusate sodium  100 mg Oral BID  . escitalopram  5 mg Oral QPM  . hydroxychloroquine  200 mg Oral BID  . ipratropium-albuterol  3 mL Nebulization TID  . isosorbide mononitrate  30 mg Oral q morning - 10a  . lactose free nutrition  237 mL Oral Q24H  . lidocaine  2 patch Transdermal Q24H  . Linaclotide  290 mcg Oral q morning - 10a  . lisinopril  10 mg Oral Daily  . multivitamin with minerals  1 tablet Oral Q1200  . omeprazole  20 mg Oral q morning - 10a  . senna  1 tablet Oral BID  . sodium chloride  3 mL Intravenous Q12H  . sulfaSALAzine  500 mg Oral TID  . warfarin  5 mg Oral ONCE-1800  . Warfarin - Pharmacist Dosing Inpatient   Does not apply q1800   Continuous Infusions: . sodium chloride 10 mL/hr at 12/04/13 1057     Assessment/Plan:  1. Rectus abdominis hematoma-  Patient now transitioned from multiple medications as multiple intolerances to current regimen of Tylenol 650 every 6 when necessary, Dilaudid by mouth 2 mg every 4 hours, tramadol 50-100 every 6 when necessary.  Add K pad, diclofenac patch to see if this helps  2. R sided Chronic DVT's-transitioned from heparin to Coumadin, and no bridging as risk for hematoma expansion 3. CAD s/p PCI 2004-only on Coumadin monotherapy. From last cardiology visit does not need to be on antiplatelet agent . Continue Imdur 30 mg every morning 4. COPD-former smoker quit 2002 -continue duo neb 3 times a day  5. Barrett's esophagus and history diverticulitis -outpatient followup-currently stable 6. Rheumatoid arthritis -continue Plaquenil 200 twice a day , sulfasalazine 500 3 times a day 7. Constipation-continue Dulcolax suppository, Colace 100 twice a day, Linzess 290 every morning 8. Reflux-continue  omeprazole 20 every morning  9. Probable bipolar 1-continue Lexapro 5 mg every afternoon 10. Multiple orthopedic issues-hip surgery 03/2012, prior L4-5 stenosis-currently stable. management as above 11. Diabetes mellitus -not currently on controlling medications.    12. Hypertension-see #3    Code Status: Full Family Communication:  Discussed with patient's daughter Disposition Plan: inpt-the patient's pain control is closer to 5 can discharge and hopefully followup as an outpatient with interventional radiology   Pleas Koch, MD  [facility] Pager [phone] 12/05/2013, 3:58 PM    LOS: 4 days

## 2013-12-06 LAB — PROTIME-INR
INR: 1.25 (ref 0.00–1.49)
PROTHROMBIN TIME: 15.8 s — AB (ref 11.6–15.2)

## 2013-12-06 LAB — HEPARIN LEVEL (UNFRACTIONATED): HEPARIN UNFRACTIONATED: 0.24 [IU]/mL — AB (ref 0.30–0.70)

## 2013-12-06 MED ORDER — WARFARIN SODIUM 5 MG PO TABS: 5.0000 mg | ORAL_TABLET | Freq: Every day | ORAL | Status: DC

## 2013-12-06 MED ORDER — TRAMADOL HCL 50 MG PO TABS: 100.0000 mg | ORAL_TABLET | Freq: Four times a day (QID) | ORAL | Status: DC | PRN

## 2013-12-06 MED ORDER — SORBITOL 70 % SOLN
20.0000 mL | Freq: Two times a day (BID) | Status: DC
  Administered 2013-12-06: 20 mL via ORAL
  Filled 2013-12-06 (×5): qty 30

## 2013-12-06 MED ORDER — HEPARIN (PORCINE) IN NACL 100-0.45 UNIT/ML-% IJ SOLN
1150.0000 [IU]/h | INTRAMUSCULAR | Status: DC
  Administered 2013-12-06: 1050 [IU]/h via INTRAVENOUS
  Administered 2013-12-06 – 2013-12-07 (×2): 1150 [IU]/h via INTRAVENOUS
  Filled 2013-12-06 (×3): qty 250

## 2013-12-06 MED ORDER — TRAMADOL HCL 50 MG PO TABS
50.0000 mg | ORAL_TABLET | Freq: Four times a day (QID) | ORAL | Status: DC | PRN
  Administered 2013-12-08 – 2013-12-10 (×3): 100 mg via ORAL
  Filled 2013-12-06 (×3): qty 2

## 2013-12-06 MED ORDER — DICLOFENAC EPOLAMINE 1.3 % TD PTCH: 1.0000 | MEDICATED_PATCH | Freq: Two times a day (BID) | TRANSDERMAL | Status: DC

## 2013-12-06 MED ORDER — WARFARIN SODIUM 3 MG PO TABS
3.0000 mg | ORAL_TABLET | Freq: Once | ORAL | Status: DC
  Filled 2013-12-06: qty 1

## 2013-12-06 MED ORDER — LISINOPRIL 10 MG PO TABS: 10.0000 mg | ORAL_TABLET | Freq: Every day | ORAL | Status: DC

## 2013-12-06 MED ORDER — HYDROMORPHONE HCL 2 MG PO TABS: 2.0000 mg | ORAL_TABLET | ORAL | Status: DC | PRN

## 2013-12-06 MED ORDER — POLYETHYLENE GLYCOL 3350 17 G PO PACK
17.0000 g | PACK | Freq: Every day | ORAL | Status: DC
  Administered 2013-12-06 – 2013-12-10 (×3): 17 g via ORAL
  Filled 2013-12-06 (×5): qty 1

## 2013-12-06 MED ORDER — WARFARIN SODIUM 4 MG PO TABS
4.0000 mg | ORAL_TABLET | Freq: Once | ORAL | Status: AC
  Administered 2013-12-06: 4 mg via ORAL
  Filled 2013-12-06 (×2): qty 1

## 2013-12-06 MED ORDER — POLYETHYLENE GLYCOL 3350 17 G PO PACK: 17.0000 g | PACK | Freq: Every day | ORAL | Status: AC

## 2013-12-06 NOTE — Discharge Summary (Signed)
Progress note  Recommendations for Outpatient Follow-up:  1. Patient will need consideration of vertebroplasty as an outpatient-please note she is on chronic Coumadin which will need to be addressed and adjusted prior to planned procedure if the patient and family elected to have this done in the future--patient was not convinced that she wanted this done on discharge from hospital. 2. Please adjust Coumadin dosage on 10/20 depending on INR level done on 10/20 3. I will copy of this note to Dr. Magnus Ivan of orthopedics who as an outpatient had been trying to manage her back pain--she will need physical therapy to assist with mobilization as an outpatient 4. Patient will be given 75 tablets of Dilaudid and 75 tablets of tramadol to help with back pain and also of Flector patch.  She has notorious intolerances to pain management but this regimen seemed to help. 5. She will need a CBC and basic metabolic panel  Discharge Diagnoses:  Active Problems:   Compression fracture   Abdominal pain   Discharge Condition: Fair  Diet recommendation: Vitamin K controlled  Filed Weights   12/04/13 0547 12/05/13 0513 12/06/13 0513  Weight: 52.4 kg (115 lb 8.3 oz) 53.933 kg (118 lb 14.4 oz) 54.023 kg (119 lb 1.6 oz)    History of present illness:  80 y/o ? h/o R sided chronic DTt's on Lifelong coumadin, CAd s/p PCI RCA 2004, COPD, Prediabetes base don A1c 08/2013, Htn, GERD, Diverticular disease, Rh Arthritis on plaquenil, Barrett's esophagus, PAD, Fe def anemia-Hip surgery 03/2012 with removal painful Hardware R hip, HLD with statin inteolerance, Abdominal aortic aneurysm, prior L4-5 stenosis admitted 12/02/13 with H/o fall and resulting back pain 3 weeks pta.  She does have some difficulty remembering when she fell off and on one side but thinks it was in her right.  She was admitted and found to have significant abdominal pain potentially secondary to rectus abdominus bruising with a hematoma palpable in  the right rectus abdominis  IR was consulted for evaluation of her T9/T10 pain and potential vertebroplasty but patient has not been able to lay on to stomach because of rectus abdominis pain which was 10/10 and did not improve during hospital stay General surgery was consulted as the differential could have been a spigelian hernia and CT scan of the abdomen was over read and this was indeed a right rectus muscle hematoma See below for further details     Hospital Course:    1. Rectus abdominis hematoma- Patient now transitioned from multiple medications as multiple intolerances to current regimen of Tylenol 650 every 6 when necessary, Dilaudid by mouth 2 mg every 4 hours, tramadol 50-100 every 6 when necessary. She was also placed on diclofenac patch and her pain was tolerable to 5/10 on day of discharge. I had a long discussion with her and her discharge and patient does not think that she personally wishes to have this procedure done, given risks involved and needing to be bridged from Coumadin to heparin and Coumadin again. 2. R sided Chronic DVT's-will need to be on chronic Coumadin. INR on discharge to be closer to 2 range prior to discharge 3. CAD s/p PCI 2004-only on Coumadin monotherapy. From last cardiology visit does not need to be on antiplatelet agent . Continue Imdur 30 mg every morning 4. COPD-former smoker quit 2002 -continue duo neb 3 times a day  5. Barrett's esophagus and history diverticulitis -outpatient followup-currently stable 6. Rheumatoid arthritis -continue Plaquenil 200 twice a day , sulfasalazine 500 3  times a day 7. Constipation-continue Dulcolax suppository, Colace 100 twice a day, Linzess 290 every morning--no stool today so will add some sorbitol 10/70 8. Reflux-continue omeprazole 20 every morning  9. Probable bipolar 1-continue Lexapro 5 mg every afternoon 10. Multiple orthopedic issues-hip surgery 03/2012, prior L4-5 stenosis-currently stable. management as  above 11. Diabetes mellitus -not currently on controlling medications.  12. Hypertension-see #3   Consultants:  Interventional radiology Procedures:  CT scan abdomen pelvis, CT angiogram abdomen pelvis General surgery Antibiotics:  None  Discharge Exam: Filed Vitals:   12/06/13 0513  BP: 162/65  Pulse: 76  Temp: 98.3 F (36.8 C)  Resp: 18   Alert pleasant doing fair pain is 5/10 in abdomen but cannot answer much Has not passed stool for 2-3 days No shortness of breath No blurred or double vision No falls but has not been out of bed  General: EOMI NCAT Cardiovascular:  S1-S2 no murmur or gallop Respiratory:  Clinically clear  Discharge Instructions You were cared for by a hospitalist during your hospital stay. If you have any questions about your discharge medications or the care you received while you were in the hospital after you are discharged, you can call the unit and asked to speak with the hospitalist on call if the hospitalist that took care of you is not available. Once you are discharged, your primary care physician will handle any further medical issues. Please note that NO REFILLS for any discharge medications will be authorized once you are discharged, as it is imperative that you return to your primary care physician (or establish a relationship with a primary care physician if you do not have one) for your aftercare needs so that they can reassess your need for medications and monitor your lab values.  Discharge Instructions   Diet - low sodium heart healthy    Complete by:  As directed      Increase activity slowly    Complete by:  As directed           Current Discharge Medication List    START taking these medications   Details  diclofenac (FLECTOR) 1.3 % PTCH Place 1 patch onto the skin 2 (two) times daily. Qty: 30 patch, Refills: 0    HYDROmorphone (DILAUDID) 2 MG tablet Take 1 tablet (2 mg total) by mouth every 4 (four) hours as needed for severe  pain. Qty: 75 tablet, Refills: 0    lisinopril (PRINIVIL,ZESTRIL) 10 MG tablet Take 1 tablet (10 mg total) by mouth daily. Qty: 30 tablet, Refills: 0    polyethylene glycol (MIRALAX / GLYCOLAX) packet Take 17 g by mouth daily. Qty: 14 each, Refills: 0      CONTINUE these medications which have CHANGED   Details  traMADol (ULTRAM) 50 MG tablet Take 2 tablets (100 mg total) by mouth every 6 (six) hours as needed for moderate pain. Qty: 75 tablet, Refills: 0    warfarin (COUMADIN) 5 MG tablet Take 1 tablet (5 mg total) by mouth daily. Qty: 4 tablet, Refills: 0      CONTINUE these medications which have NOT CHANGED   Details  Calcium Carbonate-Vitamin D (CALTRATE 600+D) 600-400 MG-UNIT per tablet Take 1 tablet by mouth every evening.     Camphor-Menthol-Methyl Sal (SALONPAS) 1.2-5.7-6.3 % PT[facility] Place 1 patch onto the skin daily as needed (For pain.).    cholecalciferol (VITAMIN D) 1000 UNITS tablet Take 2,000 Units by mouth every morning.    docusate sodium (COLACE) 100 MG capsule Take  100 mg by mouth 2 (two) times daily.     escitalopram (LEXAPRO) 5 MG tablet Take 5 mg by mouth every evening.     Flaxseed, Linseed, (FLAX SEED OIL PO) Take 1 tablet by mouth daily at 12 noon.     hydroxychloroquine (PLAQUENIL) 200 MG tablet Take 200 mg by mouth 2 (two) times daily.     isosorbide mononitrate (IMDUR) 30 MG 24 hr tablet Take 30 mg by mouth every morning.    Linaclotide (LINZESS) 145 MCG CAPS capsule Take 290 mcg by mouth every morning.     magnesium citrate SOLN Take 1 Bottle by mouth once.    methocarbamol (ROBAXIN) 500 MG tablet Take 1 tablet (500 mg total) by mouth 3 (three) times daily as needed (For muscle spasms.). Qty: 40 tablet, Refills: 0    Multiple Vitamin (MULTIVITAMIN WITH MINERALS) TABS tablet Take 1 tablet by mouth daily at 12 noon.    omeprazole (PRILOSEC OTC) 20 MG tablet Take 20 mg by mouth every morning.     promethazine (PHENERGAN) 12.5 MG tablet Take 12.5  mg by mouth every 6 (six) hours as needed for nausea (Patient takes with Tramadol).    sulfaSALAzine (AZULFIDINE) 500 MG tablet Take 500 mg by mouth 3 (three) times daily.     glucose monitoring kit (FREESTYLE) monitoring kit 1 each by Does not apply route 4 (four) times daily - after meals and at bedtime. 1 month Diabetic Testing Supplies for QAC-QHS accuchecks.Any brand OK Qty: 1 each, Refills: 1      STOP taking these medications     polyvinyl alcohol (LIQUIFILM TEARS) 1.4 % ophthalmic solution        Allergies  Allergen Reactions  . Codeine Nausea And Vomiting, Swelling and Rash  . Hydrocodone Nausea And Vomiting, Swelling and Rash    "mouth peeled"  . Morphine And Related Other (See Comments)    hallucinations  . Other Nausea And Vomiting    Most pain meds! other than tramadol. Tylenol, Toradol, and Robaxin are ok.    . Penicillins Hives  . Demerol [Meperidine] Nausea And Vomiting  . Propoxyphene Hives and Nausea And Vomiting  . Statins Other (See Comments)    INTOLERANT OF STATINS - messes up her legs and has muscle aches  . Tape Other (See Comments)    Needs paper tape - skin very fragile      The results of significant diagnostics from this hospitalization (including imaging, microbiology, ancillary and laboratory) are listed below for reference.    Significant Diagnostic Studies: Ct Angio Chest Pe W/cm &/or Wo Cm  12/02/2013   CLINICAL DATA:  Abdominal pain. Left upper rib cage pain. Initial encounter.  EXAM: CT ANGIOGRAPHY CHEST  CT ABDOMEN AND PELVIS WITH CONTRAST  TECHNIQUE: Multidetector CT imaging of the chest was performed using the standard protocol during bolus administration of intravenous contrast. Multiplanar CT image reconstructions and MIPs were obtained to evaluate the vascular anatomy. Multidetector CT imaging of the abdomen and pelvis was performed using the standard protocol during bolus administration of intravenous contrast.  CONTRAST:  120mL  OMNIPAQUE IOHEXOL 350 MG/ML SOLN  COMPARISON:  None.  FINDINGS: CTA CHEST FINDINGS  THORACIC INLET/BODY WALL:  15 mm thyroid nodule in the inferior right lobe. This is likely incidental based on size and patient age.  No acute findings.  MEDIASTINUM:  Normal heart size. No pericardial effusion. Diffuse atherosclerosis, including the coronary arteries. Status post coronary stenting. Negative for acute aortic finding (as permitted by non  opacification of the mid thoracic aorta). Negative for pulmonary embolism. Moderate sliding-type hiatal hernia without obstruction.  LUNG WINDOWS:  Subpleural reticulation which is likely chronic. There is no edema, pneumonia, effusion, or pneumothorax. No suspicious pulmonary nodules.  OSSEOUS:  There is a T9 compression fracture with height loss greater than 75%. Although an acute fracture line is not visible, there is surrounding relatively low-density perispinal fat infiltration - likely edema or hemorrhage. No definite bowing of the fracture, enhancing soft tissue, or sclerosis suggestive of pathologic fracture. No subluxation.  Remote T7 compression fracture status post bone augmentation.  Remote right-sided rib fractures.  Right glenohumeral arthroplasty with anterior subluxation of humeral prosthesis, commonly encounter.  CT ABDOMEN and PELVIS FINDINGS  BODY WALL: Unremarkable.  ABDOMEN/PELVIS:  Liver: No focal abnormality.  Biliary: Cholecystectomy.  Pancreas: Unremarkable.  Spleen: Granulomatous changes. Heterogeneous enhancement of the inferior spleen resolves on delayed imaging. No splenic mass.  Adrenals: Unremarkable.  Kidneys and ureters: No hydronephrosis or stone.  Bladder: Moderately distended.  Reproductive: Low pelvic floor. Status post hysterectomy. No adnexal mass.  Bowel: Moderate sliding-type hiatal hernia. No bowel obstruction. No pericecal inflammation.  Retroperitoneum: No mass or adenopathy.  Peritoneum: No ascites or pneumoperitoneum.  Vascular: Extensive  atherosclerosis, status post aorto bi femoral bypass. The graft is patent. No acute vascular findings.  OSSEOUS: Left total hip arthroplasty. Remote proximal right femur ORIF. No adverse findings. Remote insufficiency fractures of the right sacral ala and inferior pubic ramus. No acute fracture identified. L4-5 laminotomy.  Review of the MIP images confirms the above findings.  IMPRESSION: 1. Recent T9 compression fracture with severe height loss but no bony retropulsion. 2. Negative for pulmonary embolism. 3. No acute intra-abdominal findings.   Electronically Signed   By: Jorje Guild M.D.   On: 12/02/2013 01:23   Mr Thoracic Spine Wo Contrast  12/05/2013   CLINICAL DATA:  Fall 3 weeks ago. T9 fracture. Subsequent encounter  EXAM: MRI THORACIC SPINE WITHOUT CONTRAST  TECHNIQUE: Multiplanar, multisequence MR imaging of the thoracic spine was performed. No intravenous contrast was administered.  COMPARISON:  CT of the chest 12/02/2013.  FINDINGS: Normal signal is present throughout the thoracic spinal cord to the conus medullaris which terminates at L1-2, within normal limits.  Vertebral augmentation is evident at T7. There is a new burst fracture scratch the there is a new compression fracture at T9 with diffuse marrow edema. There slight retropulsion of bone which effaces the ventral CSF at this level. A fracture is noted along the anterior inferior endplate of T8 as well. A hemangioma is present posteriorly within the T8 vertebral body. An inferior endplate fracture at T6 is chronic.  Facet hypertrophy and exaggerated kyphosis lead to mild central and bilateral foraminal narrowing at T8-9, right greater than left.  T9-10: Retropulsed bone and facet hypertrophy results in moderate right and mild left foraminal stenosis.  T10-11: A shallow left paramedian disc protrusion is present without significant stenosis.  Slight retrolisthesis and uncovering of a broad-based protrusion is present at L1-2. Mild  subarticular stenosis is evident.  IMPRESSION: 1. New osteoporotic compression fracture at T9 with slight retropulsion of bone resulting and mild central canal stenosis. There is diffuse edema throughout the vertebral body. 2. Acute/subacute anterior inferior endplate compression fracture at T8. 3. Mild central and bilateral foraminal narrowing at T8-9. 4. Moderate right and mild left foraminal stenosis at T9-10. 5. Shallow left paramedian disc protrusion at T10-11 without significant stenosis. 6. Uncovering broad-based disc protrusion with mild subarticular narrowing  bilaterally at L1-2.   Electronically Signed   By: Lawrence Santiago M.D.   On: 12/05/2013 15:45   Ct Abdomen Pelvis W Contrast  12/02/2013   CLINICAL DATA:  Abdominal pain. Left upper rib cage pain. Initial encounter.  EXAM: CT ANGIOGRAPHY CHEST  CT ABDOMEN AND PELVIS WITH CONTRAST  TECHNIQUE: Multidetector CT imaging of the chest was performed using the standard protocol during bolus administration of intravenous contrast. Multiplanar CT image reconstructions and MIPs were obtained to evaluate the vascular anatomy. Multidetector CT imaging of the abdomen and pelvis was performed using the standard protocol during bolus administration of intravenous contrast.  CONTRAST:  120mL OMNIPAQUE IOHEXOL 350 MG/ML SOLN  COMPARISON:  None.  FINDINGS: CTA CHEST FINDINGS  THORACIC INLET/BODY WALL:  15 mm thyroid nodule in the inferior right lobe. This is likely incidental based on size and patient age.  No acute findings.  MEDIASTINUM:  Normal heart size. No pericardial effusion. Diffuse atherosclerosis, including the coronary arteries. Status post coronary stenting. Negative for acute aortic finding (as permitted by non opacification of the mid thoracic aorta). Negative for pulmonary embolism. Moderate sliding-type hiatal hernia without obstruction.  LUNG WINDOWS:  Subpleural reticulation which is likely chronic. There is no edema, pneumonia, effusion, or  pneumothorax. No suspicious pulmonary nodules.  OSSEOUS:  There is a T9 compression fracture with height loss greater than 75%. Although an acute fracture line is not visible, there is surrounding relatively low-density perispinal fat infiltration - likely edema or hemorrhage. No definite bowing of the fracture, enhancing soft tissue, or sclerosis suggestive of pathologic fracture. No subluxation.  Remote T7 compression fracture status post bone augmentation.  Remote right-sided rib fractures.  Right glenohumeral arthroplasty with anterior subluxation of humeral prosthesis, commonly encounter.  CT ABDOMEN and PELVIS FINDINGS  BODY WALL: Unremarkable.  ABDOMEN/PELVIS:  Liver: No focal abnormality.  Biliary: Cholecystectomy.  Pancreas: Unremarkable.  Spleen: Granulomatous changes. Heterogeneous enhancement of the inferior spleen resolves on delayed imaging. No splenic mass.  Adrenals: Unremarkable.  Kidneys and ureters: No hydronephrosis or stone.  Bladder: Moderately distended.  Reproductive: Low pelvic floor. Status post hysterectomy. No adnexal mass.  Bowel: Moderate sliding-type hiatal hernia. No bowel obstruction. No pericecal inflammation.  Retroperitoneum: No mass or adenopathy.  Peritoneum: No ascites or pneumoperitoneum.  Vascular: Extensive atherosclerosis, status post aorto bi femoral bypass. The graft is patent. No acute vascular findings.  OSSEOUS: Left total hip arthroplasty. Remote proximal right femur ORIF. No adverse findings. Remote insufficiency fractures of the right sacral ala and inferior pubic ramus. No acute fracture identified. L4-5 laminotomy.  Review of the MIP images confirms the above findings.  IMPRESSION: 1. Recent T9 compression fracture with severe height loss but no bony retropulsion. 2. Negative for pulmonary embolism. 3. No acute intra-abdominal findings.   Electronically Signed   By: Jorje Guild M.D.   On: 12/02/2013 01:23    Microbiology: Recent Results (from the past 240  hour(s))  URINE CULTURE     Status: None   Collection Time    12/01/13 10:23 PM      Result Value Ref Range Status   Specimen Description URINE, CLEAN CATCH   Final   Special Requests NONE   Final   Culture  Setup Time     Final   Value: 12/03/2013 01:27     Performed at [facility]     Final   Value: >=100,000 COLONIES/ML     Performed at [facility]  Final   Value: Multiple bacterial morphotypes present, none predominant. Suggest appropriate recollection if clinically indicated.     Performed at [facility]   Report Status 12/03/2013 FINAL   Final     Labs: Basic Metabolic Panel:  Recent Labs Lab 12/01/13 1723 12/02/13 0640  NA 134* 135*  K 4.5 3.9  CL 96 99  CO2 25 25  GLUCOSE 104* 114*  BUN 8 5*  CREATININE 0.53 0.47*  CALCIUM 9.2 8.9   Liver Function Tests:  Recent Labs Lab 12/01/13 1723  AST 22  ALT 12  ALKPHOS 91  BILITOT 0.3  PROT 6.8  ALBUMIN 3.5    Recent Labs Lab 12/01/13 1730  LIPASE 24   No results found for this basename: AMMONIA,  in the last 168 hours CBC:  Recent Labs Lab 12/01/13 1723 12/02/13 0640 12/03/13 0540 12/04/13 0345  WBC 5.6 5.7 4.4 4.8  NEUTROABS 3.5  --   --   --   HGB 11.6* 11.2* 10.2* 10.1*  HCT 35.4* 33.9* 30.7* 30.9*  MCV 87.6 86.7 88.0 89.3  PLT 208 204 172 180   Cardiac Enzymes:  Recent Labs Lab 12/02/13 0640 12/02/13 0956 12/02/13 1555  TROPONINI <0.30 <0.30 <0.30   BNP: BNP (last 3 results) No results found for this basename: PROBNP,  in the last 8760 hours CBG: No results found for this basename: GLUCAP,  in the last 168 hours     Signed:  Nita Sells  [facility] 12/06/2013, 8:40 AM

## 2013-12-06 NOTE — Progress Notes (Signed)
Physical Therapy Treatment Patient Details Name: [name] MRN: [id number] DOB: 05-01-1930 Today's Date: 12/06/2013    History of Present Illness 80 y/o Fadmitted 12/01/13 with PMhx of angina, iron deficiency, HTN, HLD, COPD, DVT on coumadin, GERD, stroke, RA, OA, DJD presented to [facility] ED with main concern of several days duration of persistent abd discomfort, associated with cramping, poor oral intake. [name] Recent T9 compression fracture, being concidered for Kyphoplasty.    PT Comments    Patient is up , just had her bath, ambulated x 160' with RWand c/o of pain in arms, not back not abdomen/  Follow Up Recommendations  Home health PT;Supervision/Assistance - 24 hour     Equipment Recommendations  None recommended by PT    Recommendations for Other Services       Precautions / Restrictions Precautions Precautions: Fall;Back    Mobility  Bed Mobility Overal bed mobility: Needs Assistance         Sit to supine: Min guard;HOB elevated   General bed mobility comments: pt is moving much better, no assist, encouraged to go to side, tended to just go to her back, able to get legs onto bed.  Transfers Overall transfer level: Needs assistance Equipment used: Rolling walker (2 wheeled)   Sit to Stand: Supervision            Ambulation/Gait Ambulation/Gait assistance: Min guard Ambulation Distance (Feet): 160 Feet Assistive device: Rolling walker (2 wheeled) Gait Pattern/deviations: Step-through pattern     General Gait Details: gait is quite steady and functional today.   Stairs            Wheelchair Mobility    Modified Rankin (Stroke Patients Only)       Balance           Standing balance support: During functional activity;No upper extremity supported Standing balance-Leahy Scale: Good Standing balance comment: standing inn Bathroom for ADL's with CNA, no loss of balance                    Cognition Arousal/Alertness:  Awake/alert                          Exercises      General Comments        Pertinent Vitals/Pain Pain Score: 2  Pain Location: arms    Home Living                      Prior Function            PT Goals (current goals can now be found in the care plan section) Progress towards PT goals: Progressing toward goals    Frequency  Min 3X/week    PT Plan Current plan remains appropriate    Co-evaluation             End of Session   Activity Tolerance: Patient tolerated treatment well Patient left: in bed;with call bell/phone within reach;with bed alarm set     Time: 6010-9323 PT Time Calculation (min): 18 min  Charges:  $Gait Training: 8-22 mins                    G Codes:      Rada Hay 12/06/2013, 4:06 PM Blanchard Kelch PT [phone]

## 2013-12-06 NOTE — Progress Notes (Addendum)
ANTICOAGULATION CONSULT NOTE - Follow Up Consult  Pharmacy Consult for Warfarin Indication: history of DVT  Allergies  Allergen Reactions  . Codeine Nausea And Vomiting, Swelling and Rash  . Hydrocodone Nausea And Vomiting, Swelling and Rash    "mouth peeled"  . Morphine And Related Other (See Comments)    hallucinations  . Other Nausea And Vomiting    Most pain meds! other than tramadol. Tylenol, Toradol, and Robaxin are ok.    . Penicillins Hives  . Demerol [Meperidine] Nausea And Vomiting  . Propoxyphene Hives and Nausea And Vomiting  . Statins Other (See Comments)    INTOLERANT OF STATINS - messes up her legs and has muscle aches  . Tape Other (See Comments)    Needs paper tape - skin very fragile    Patient Measurements: Height: 5\' 3"  (160 cm) Weight: 119 lb 1.6 oz (54.023 kg) IBW/kg (Calculated) : 52.4  Vital Signs: Temp: 98.3 F (36.8 C) (10/17 0513) Temp Source: Oral (10/17 0513) BP: 162/65 mmHg (10/17 0513) Pulse Rate: 76 (10/17 0513)  Labs:  Recent Labs  12/04/13 0345  12/04/13 1350 12/05/13 0255 12/05/13 1352 12/06/13 0421  HGB 10.1*  --   --   --   --   --   HCT 30.9*  --   --   --   --   --   PLT 180  --   --   --   --   --   LABPROT 18.2*  --   --  16.8*  --  15.8*  INR 1.50*  --   --  1.35  --  1.25  HEPARINUNFRC  --   < > 0.49 0.28* <0.10*  --   < > = values in this interval not displayed.  Estimated Creatinine Clearance: 45.6 ml/min (by C-G formula based on Cr of 0.47).   Infusions:  . sodium chloride 10 mL/hr at 12/04/13 1057    Assessment: 38 yoF presented to ED on 10/12 with abdominal pain, cramping, poor PO intake.  PMH includes angina, iron deficiency, HTN, HLD, COPD, DVT on warfarin, GERD, stroke, RA, OA, DJD.  Warfarin dose PTA was 3mg  daily, except 4.5mg  on Mon/Wed; therapeutic INR on admission.  Warfarin held for possible procedure.  Pharmacy consulted to start heparin when INR <2.0.  With no further plans for procedure, pharmacy  is consulted to resume warfarin dosing on 10/16.  Today, 10/17  CBC: 10/16 - Hgb = 10.2 (low, stable) and Plt WNL.     INR = 1.25  RN reports no bleeding or complications.  + hematoma of R rectus abdominis following fall.   Heparin gtt stopped 10/16 per MD - do NOT bridge with parenteral anticoagulation d/t hematoma, despite subtherapeutic INR.  Started diclofenac patch 10/16  Goal of Therapy:  Heparin level 0.3-0.5 units/ml Monitor platelets by anticoagulation protocol: Yes   Plan:   Warfarin 4mg  PO today (booster dose of 5mg  given 10/16 as was off warfarin x 3 days)  Daily INR  Continue to monitor H&H and platelets  11/16, PharmD, BCPS.   Pager: 12/06/2013 7:32 AM  Addendum:   Orders to re-start heparin gtt per Dr. 11/16.  He wants INR to be > 1.8 prior to discharge. Use lower end heparin level goal.  Wants to proceed although heparin stopped 10/16 for concerns of hematoma expansion, restart and monitor.  Plan:  Based on previous heparin rates and level, resume heparin gtt at 1050 units/hr  Check 8h heparin level  Daily heparin level and CBC  Juliette Alcide, PharmD, BCPS.   Pager: [phone] 12/06/2013 9:32 AM

## 2013-12-06 NOTE — Progress Notes (Signed)
ANTICOAGULATION CONSULT NOTE - Follow Up Consult  Pharmacy Consult for Heparin Indication: history of DVT  Labs:  Recent Labs  12/04/13 0345  12/05/13 0255 12/05/13 1352 12/06/13 0421 12/06/13 1843  HGB 10.1*  --   --   --   --   --   HCT 30.9*  --   --   --   --   --   PLT 180  --   --   --   --   --   LABPROT 18.2*  --  16.8*  --  15.8*  --   INR 1.50*  --  1.35  --  1.25  --   HEPARINUNFRC  --   < > 0.28* <0.10*  --  0.24*  < > = values in this interval not displayed.  Estimated Creatinine Clearance: 45.6 ml/min (by C-G formula based on Cr of 0.47).   Infusions:  . sodium chloride 10 mL/hr at 12/04/13 1057  . heparin 1,050 Units/hr (12/06/13 1044)    Assessment: Heparin level is slightly below goal range on 1050units/hr. No bleeding reported/documented.  Goal of Therapy:  Heparin level 0.3-0.5 units/ml Monitor platelets by anticoagulation protocol: Yes   Plan:   Increase heparin conservatively to 1150units/hr.  F/u heparin level with am labs.  Charolotte Eke, PharmD, pager [phone]. 12/06/2013,7:23 PM.

## 2013-12-07 LAB — CBC WITH DIFFERENTIAL/PLATELET
BASOS PCT: 0 % (ref 0–1)
Basophils Absolute: 0 10*3/uL (ref 0.0–0.1)
EOS ABS: 0.4 10*3/uL (ref 0.0–0.7)
EOS PCT: 9 % — AB (ref 0–5)
HEMATOCRIT: 28.4 % — AB (ref 36.0–46.0)
Hemoglobin: 9.6 g/dL — ABNORMAL LOW (ref 12.0–15.0)
LYMPHS PCT: 19 % (ref 12–46)
Lymphs Abs: 0.9 10*3/uL (ref 0.7–4.0)
MCH: 29.5 pg (ref 26.0–34.0)
MCHC: 33.8 g/dL (ref 30.0–36.0)
MCV: 87.4 fL (ref 78.0–100.0)
MONO ABS: 0.5 10*3/uL (ref 0.1–1.0)
Monocytes Relative: 12 % (ref 3–12)
Neutro Abs: 2.7 10*3/uL (ref 1.7–7.7)
Neutrophils Relative %: 60 % (ref 43–77)
PLATELETS: 193 10*3/uL (ref 150–400)
RBC: 3.25 MIL/uL — ABNORMAL LOW (ref 3.87–5.11)
RDW: 14.6 % (ref 11.5–15.5)
WBC: 4.5 10*3/uL (ref 4.0–10.5)

## 2013-12-07 LAB — RENAL FUNCTION PANEL
ALBUMIN: 2.8 g/dL — AB (ref 3.5–5.2)
ANION GAP: 11 (ref 5–15)
BUN: 5 mg/dL — AB (ref 6–23)
CHLORIDE: 105 meq/L (ref 96–112)
CO2: 23 mEq/L (ref 19–32)
Calcium: 8.8 mg/dL (ref 8.4–10.5)
Creatinine, Ser: 0.46 mg/dL — ABNORMAL LOW (ref 0.50–1.10)
GFR calc non Af Amer: >90 mL/min (ref >90)
GLUCOSE: 102 mg/dL — AB (ref 70–99)
POTASSIUM: 3.4 meq/L — AB (ref 3.7–5.3)
Phosphorus: 3.8 mg/dL (ref 2.3–4.6)
SODIUM: 139 meq/L (ref 137–147)

## 2013-12-07 LAB — HEPARIN LEVEL (UNFRACTIONATED)
HEPARIN UNFRACTIONATED: 0.34 [IU]/mL (ref 0.30–0.70)
Heparin Unfractionated: 0.28 IU/mL — ABNORMAL LOW (ref 0.30–0.70)
Heparin Unfractionated: 0.42 IU/mL (ref 0.30–0.70)

## 2013-12-07 LAB — PROTIME-INR
INR: 1.61 — ABNORMAL HIGH (ref 0.00–1.49)
Prothrombin Time: 19.3 seconds — ABNORMAL HIGH (ref 11.6–15.2)

## 2013-12-07 MED ORDER — PHYTONADIONE 5 MG PO TABS
2.5000 mg | ORAL_TABLET | Freq: Once | ORAL | Status: AC
  Administered 2013-12-07: 2.5 mg via ORAL
  Filled 2013-12-07 (×2): qty 1

## 2013-12-07 MED ORDER — SORBITOL 70 % SOLN
20.0000 mL | Freq: Every day | Status: DC | PRN
  Filled 2013-12-07: qty 30

## 2013-12-07 MED ORDER — HEPARIN (PORCINE) IN NACL 100-0.45 UNIT/ML-% IJ SOLN
1250.0000 [IU]/h | INTRAMUSCULAR | Status: DC
  Administered 2013-12-08: 1250 [IU]/h via INTRAVENOUS
  Filled 2013-12-07 (×2): qty 250

## 2013-12-07 MED ORDER — WARFARIN SODIUM 4 MG PO TABS
4.0000 mg | ORAL_TABLET | Freq: Once | ORAL | Status: DC
  Filled 2013-12-07: qty 1

## 2013-12-07 MED ORDER — METHOCARBAMOL 500 MG PO TABS: 500.0000 mg | ORAL_TABLET | Freq: Three times a day (TID) | ORAL | Status: DC | PRN

## 2013-12-07 NOTE — Progress Notes (Signed)
ANTICOAGULATION CONSULT NOTE - Follow Up Consult  Pharmacy Consult Heparin Indication: history of DVT  Allergies  Allergen Reactions  . Codeine Nausea And Vomiting, Swelling and Rash  . Hydrocodone Nausea And Vomiting, Swelling and Rash    "mouth peeled"  . Morphine And Related Other (See Comments)    hallucinations  . Other Nausea And Vomiting    Most pain meds! other than tramadol. Tylenol, Toradol, and Robaxin are ok.    . Penicillins Hives  . Demerol [Meperidine] Nausea And Vomiting  . Propoxyphene Hives and Nausea And Vomiting  . Statins Other (See Comments)    INTOLERANT OF STATINS - messes up her legs and has muscle aches  . Tape Other (See Comments)    Needs paper tape - skin very fragile    Patient Measurements: Height: 5\' 3"  (160 cm) Weight: 116 lb 2.9 oz (52.7 kg) IBW/kg (Calculated) : 52.4 Heparin dosing weight = 52kg  Vital Signs: Temp: 98.3 F (36.8 C) (10/18 1427) Temp Source: Oral (10/18 1427) BP: 125/56 mmHg (10/18 1427) Pulse Rate: 86 (10/18 1427)  Labs:  Recent Labs  12/05/13 0255  12/06/13 0421  12/07/13 0510 12/07/13 1151 12/07/13 2114  HGB  --   --   --   --  9.6*  --   --   HCT  --   --   --   --  28.4*  --   --   PLT  --   --   --   --  193  --   --   LABPROT 16.8*  --  15.8*  --  19.3*  --   --   INR 1.35  --  1.25  --  1.61*  --   --   HEPARINUNFRC 0.28*  < >  --   < > 0.34 0.28* 0.42  CREATININE  --   --   --   --  0.46*  --   --   < > = values in this interval not displayed.  Estimated Creatinine Clearance: 45.6 ml/min (by C-G formula based on Cr of 0.46).   Infusions:  . sodium chloride 10 mL/hr at 12/07/13 1010  . heparin 1,250 Units/hr (12/07/13 1249)    Assessment: 44 yoF presented to ED on 10/12 with abdominal pain, cramping, poor PO intake.  PMH includes angina, iron deficiency, HTN, HLD, COPD, DVT on warfarin, GERD, stroke, RA, OA, DJD.  Warfarin dose PTA was 3mg  daily, except 4.5mg  on Mon/Wed; therapeutic INR on  admission.  Warfarin held for possible procedure.  Pharmacy consulted to start heparin when INR <2.0.  With no further plans for procedure, pharmacy is consulted to resume warfarin dosing on 10/16.  Today, 10/18  Heparin level = 0.34 (therapeutic) on 1150 units/hr  INR = 1.61  CBC: 10/16 - Hgb = 9.6 (low, stable) and Plt WNL.     RN reports no bleeding or complications.  + hematoma of R rectus abdominis following fall.   Heparin gtt stopped 10/16 per MD - do NOT bridge with parenteral anticoagulation d/t hematoma, MD changed mind 10/17 and asked for pharmacy to dose until INR > 1.8. Shooting for lower end heparin level goal (per d/w MD)  Started diclofenac patch 10/16  2114 HL= 0.42,  no problems reported per RN  IR to see patient on Monday and more than likely schedule procedure for Tuesday.   Goal of Therapy:  Heparin level 0.3-0.5 units/ml Monitor platelets by anticoagulation protocol: Yes   Plan:  Continue heparin drip @ 1250 units/hr  Daily INR, CBC, and heparin level  Continue to monitor H&H and platelets  Per RN IR will talk with patient on Monday and procedure (vertebroplasty) will be Tuesday.   Lorenza Evangelist 12/07/2013 10:35 PM

## 2013-12-07 NOTE — Progress Notes (Signed)
[name] ZES:[id number] DOB: [birth year] DOA: 12/01/2013 PCP: Hoyle Sauer, MD  Brief narrative: 80 y/o ? h/o R sided chronic DTt's on Lifelong coumadin, CAd s/p PCI RCA 2004, COPD, Prediabetes base don A1c 08/2013, Htn, GERD, Diverticular disease, Rh Arthritis on plaquenil, Barrett's esophagus, PAD, Fe def anemia-Hip surgery 03/2012 with removal painful Hardware R hip, HLDwith statin inteolerance, Abdominal aortic aneurysm, prior L4-5 stenosis admitted 12/02/13 with  H/o fall and resulting back pain 3 weeks pta.  She does have some difficulty remembering when she fell off and on one side but thinks it was in her right. She was admitted and found to have significant abdominal pain potentially secondary to rectus abdominus bruising with a hematoma palpable in the right rectus abdominis IR was consulted for evaluation of her T9/T10 pain and potential vertebroplasty but patient has been able to lay on to stomach because of rectus abdominis pain.  eventually her abd pain was better with slow resolution of the abdominal pain and IR was re-consulted to see her 12/08/13  Past medical history-As per Problem list Chart reviewed as below- Reviewed  Consultants:  Interventional radiology  Procedures:  CT scan abdomen pelvis, CT angiogram abdomen pelvis  Antibiotics:  None   Subjective   Pain 3/10 Looks and feels much better Daughter noticing a little tremulousness Eating hamburger at beside   Objective    Interim History: Reviewed  Telemetry: Sinus rhythm/sinus tach   Objective: Filed Vitals:   12/06/13 2126 12/07/13 0349 12/07/13 0752 12/07/13 1427  BP: 148/58 180/73  125/56  Pulse: 85 79  86  Temp: 98.2 F (36.8 C) 98.2 F (36.8 C)  98.3 F (36.8 C)  TempSrc: Oral Oral  Oral  Resp: 20 20  18   Height:      Weight:  52.7 kg (116 lb 2.9 oz)    SpO2: 100% 100% 100% 98%    Intake/Output Summary (Last 24 hours) at 12/07/13 1542 Last data filed at 12/07/13 1044  Gross per 24 hour  Intake    120 ml  Output      0 ml  Net    120 ml    Exam:  General: Alert frail pleasant Caucasian female.  AAAx4 Cardiovascular: S1-S2 no murmur rub or gallop Respiratory: Clinically clear no added sound Abdomen: less tender 12th rib right side  Skin see above Neuro 5/5 power throughout, sensory grossly throughout normal  Data Reviewed: Basic Metabolic Panel:  Recent Labs Lab 12/01/13 1723 12/02/13 0640 12/07/13 0510  NA 134* 135* 139  K 4.5 3.9 3.4*  CL 96 99 105  CO2 25 25 23   GLUCOSE 104* 114* 102*  BUN 8 5* 5*  CREATININE 0.53 0.47* 0.46*  CALCIUM 9.2 8.9 8.8  PHOS  --   --  3.8   Liver Function Tests:  Recent Labs Lab 12/01/13 1723 12/07/13 0510  AST 22  --   ALT 12  --   ALKPHOS 91  --   BILITOT 0.3  --   PROT 6.8  --   ALBUMIN 3.5 2.8*    Recent Labs Lab 12/01/13 1730  LIPASE 24   No results found for this basename: AMMONIA,  in the last 168 hours CBC:  Recent Labs Lab 12/01/13 1723 12/02/13 0640 12/03/13 0540 12/04/13 0345 12/07/13 0510  WBC 5.6 5.7 4.4 4.8 4.5  NEUTROABS 3.5  --   --   --  2.7  HGB 11.6* 11.2* 10.2* 10.1* 9.6*  HCT 35.4* 33.9* 30.7* 30.9* 28.4*  MCV 87.6 86.7 88.0 89.3 87.4  PLT 208 204 172 180 193   Cardiac Enzymes:  Recent Labs Lab 12/02/13 0640 12/02/13 0956 12/02/13 1555  TROPONINI <0.30 <0.30 <0.30   BNP: No components found with this basename: POCBNP,  CBG: No results found for this basename: GLUCAP,  in the last 168 hours  Recent Results (from the past 240 hour(s))  URINE CULTURE     Status: None   Collection Time    12/01/13 10:23 PM      Result Value Ref Range Status   Specimen Description URINE, CLEAN CATCH   Final   Special Requests NONE   Final   Culture  Setup Time     Final   Value: 12/03/2013 01:27     Performed at [facility] Count     Final   Value: >=100,000 COLONIES/ML     Performed at [facility]   Culture     Final   Value:  Multiple bacterial morphotypes present, none predominant. Suggest appropriate recollection if clinically indicated.     Performed at [facility]   Report Status 12/03/2013 FINAL   Final     Studies:              All Imaging reviewed and is as per above notation   Scheduled Meds: . diclofenac  1 patch Transdermal BID  . docusate sodium  100 mg Oral BID  . escitalopram  5 mg Oral QPM  . hydroxychloroquine  200 mg Oral BID  . ipratropium-albuterol  3 mL Nebulization TID  . isosorbide mononitrate  30 mg Oral q morning - 10a  . lactose free nutrition  237 mL Oral Q24H  . lidocaine  2 patch Transdermal Q24H  . Linaclotide  290 mcg Oral q morning - 10a  . lisinopril  10 mg Oral Daily  . multivitamin with minerals  1 tablet Oral Q1200  . omeprazole  20 mg Oral q morning - 10a  . phytonadione  2.5 mg Oral Once  . polyethylene glycol  17 g Oral Daily  . senna  1 tablet Oral BID  . sodium chloride  3 mL Intravenous Q12H  . sorbitol  20 mL Oral BID PC  . sulfaSALAzine  500 mg Oral TID   Continuous Infusions: . sodium chloride 10 mL/hr at 12/07/13 1010  . heparin 1,250 Units/hr (12/07/13 1249)     Assessment/Plan:  1. Rectus abdominis hematoma-  Patient now transitioned from multiple medications as multiple intolerances to current regimen of Tylenol 650 every 6 when necessary, Dilaudid by mouth 2 mg every 4 hours, tramadol 50-100 every 6 when necessary.  Add K pad, diclofenac patch to see if this helps.  D/c lidoderm patch 2. R sided Chronic DVT's-reverse INR 1.6 with Vit K 2.5. X 1 dose.  Continue heparin gtt until seen for re-consideration of T9,T10 vertebroplasty-Discussed with Dr. Lowella Dandy 10/18 and patient might get procedure 10/20  3. Tremulousness-d/c Robaxin, lidoderm-monitor in am 4. CAD s/p PCI 2004-only on Coumadin monotherapy. From last cardiology visit does not need to be on antiplatelet agent . Continue Imdur 30 mg every morning 5. COPD-former smoker quit 2002 -continue  duo neb 3 times a day  6. Barrett's esophagus and history diverticulitis -outpatient followup-currently stable 7. Rheumatoid arthritis -continue Plaquenil 200 twice a day , sulfasalazine 500 3 times a day 8. Constipation-d/c Dulcolax suppository,d/c  Colace 100 twice a day, d/c Linzess 290 every morning.  Continua Senna/miralax- Good effect  with Sorbitol-scale back to once daily and give on d/c home. 9. Reflux-continue omeprazole 20 every morning  10. Probable bipolar 1-continue Lexapro 5 mg every afternoon 11. Multiple orthopedic issues-hip surgery 03/2012, prior L4-5 stenosis-currently stable. management as above 12. Diabetes mellitus -not currently on controlling medications.    13. Hypertension-see #3    Code Status: Full Family Communication:  Discussed with patient's daughter Disposition Plan: inpt-pending Ir procedure   Pleas Koch, MD  [facility] Pager [phone] 12/07/2013, 3:42 PM    LOS: 6 days

## 2013-12-07 NOTE — Progress Notes (Addendum)
ANTICOAGULATION CONSULT NOTE - Follow Up Consult  Pharmacy Consult for Warfarin/heparin Indication: history of DVT  Allergies  Allergen Reactions  . Codeine Nausea And Vomiting, Swelling and Rash  . Hydrocodone Nausea And Vomiting, Swelling and Rash    "mouth peeled"  . Morphine And Related Other (See Comments)    hallucinations  . Other Nausea And Vomiting    Most pain meds! other than tramadol. Tylenol, Toradol, and Robaxin are ok.    . Penicillins Hives  . Demerol [Meperidine] Nausea And Vomiting  . Propoxyphene Hives and Nausea And Vomiting  . Statins Other (See Comments)    INTOLERANT OF STATINS - messes up her legs and has muscle aches  . Tape Other (See Comments)    Needs paper tape - skin very fragile    Patient Measurements: Height: 5\' 3"  (160 cm) Weight: 116 lb 2.9 oz (52.7 kg) IBW/kg (Calculated) : 52.4 Heparin dosing weight = 52kg  Vital Signs: Temp: 98.2 F (36.8 C) (10/18 0349) Temp Source: Oral (10/18 0349) BP: 180/73 mmHg (10/18 0349) Pulse Rate: 79 (10/18 0349)  Labs:  Recent Labs  12/05/13 0255 12/05/13 1352 12/06/13 0421 12/06/13 1843 12/07/13 0510  HGB  --   --   --   --  9.6*  HCT  --   --   --   --  28.4*  PLT  --   --   --   --  193  LABPROT 16.8*  --  15.8*  --  19.3*  INR 1.35  --  1.25  --  1.61*  HEPARINUNFRC 0.28* <0.10*  --  0.24* 0.34  CREATININE  --   --   --   --  0.46*    Estimated Creatinine Clearance: 45.6 ml/min (by C-G formula based on Cr of 0.46).   Infusions:  . sodium chloride 10 mL/hr at 12/04/13 1057  . heparin 1,150 Units/hr (12/06/13 1924)    Assessment: 66 yoF presented to ED on 10/12 with abdominal pain, cramping, poor PO intake.  PMH includes angina, iron deficiency, HTN, HLD, COPD, DVT on warfarin, GERD, stroke, RA, OA, DJD.  Warfarin dose PTA was 3mg  daily, except 4.5mg  on Mon/Wed; therapeutic INR on admission.  Warfarin held for possible procedure.  Pharmacy consulted to start heparin when INR <2.0.   With no further plans for procedure, pharmacy is consulted to resume warfarin dosing on 10/16.  Today, 10/18  Heparin level = 0.34 (therapeutic) on 1150 units/hr  INR = 1.61  CBC: 10/16 - Hgb = 9.6 (low, stable) and Plt WNL.     RN reports no bleeding or complications.  + hematoma of R rectus abdominis following fall.   Heparin gtt stopped 10/16 per MD - do NOT bridge with parenteral anticoagulation d/t hematoma, MD changed mind 10/17 and asked for pharmacy to dose until INR > 1.8. Shooting for lower end heparin level goal (per d/w MD)  Started diclofenac patch 10/16  Goal of Therapy:  Heparin level 0.3-0.5 units/ml Monitor platelets by anticoagulation protocol: Yes   Plan:   Continue heparin 1150 units/hr, check confirmatory level this afternoon   Warfarin 4mg  PO today, anticipate INR will be > 1.8 in am  Daily INR, CBC, and heparin level  Continue to monitor H&H and platelets  At discharge, suggest resuming home dose of warfarin - 3mg  daily except 4.5mg  on MW  11/17, PharmD, BCPS.   Pager: [phone] 12/07/2013 7:42 AM   Addendum:   Confirmatory heparin level = 0.28 (slightly subtherapeutic)  on 1150 units/hr. No issue with infusion per RN  Goal heparin level is 0.3-0.5 per d/w MD  A/P:   Increase rate to 1250 units/hr  Check 8h heparin level  Juliette Alcide, PharmD, BCPS.   Pager: [phone] 12/07/2013 12:47 PM  Addendum:  Received page from Dr. Mahala Menghini, patient wishes to proceed with vertebroplasty.  Stop warfarin dose for tonight.  May give low dose vit K to increase chance INR acceptable for procedure.  Plan:  D/c warfarin orders/protocol - f/u restart warfarin post-procedure  Follow-up stop time for heparin gtt - likely wont be until tom when IR can schedule   Daily INR  Juliette Alcide, PharmD, BCPS.   Pager: [phone] 12/07/2013 3:28 PM

## 2013-12-08 LAB — CBC
HEMATOCRIT: 30.1 % — AB (ref 36.0–46.0)
Hemoglobin: 10 g/dL — ABNORMAL LOW (ref 12.0–15.0)
MCH: 29.8 pg (ref 26.0–34.0)
MCHC: 33.2 g/dL (ref 30.0–36.0)
MCV: 89.6 fL (ref 78.0–100.0)
Platelets: 180 10*3/uL (ref 150–400)
RBC: 3.36 MIL/uL — ABNORMAL LOW (ref 3.87–5.11)
RDW: 14.8 % (ref 11.5–15.5)
WBC: 4.8 10*3/uL (ref 4.0–10.5)

## 2013-12-08 LAB — PROTIME-INR
INR: 1.49 (ref 0.00–1.49)
Prothrombin Time: 18.2 seconds — ABNORMAL HIGH (ref 11.6–15.2)

## 2013-12-08 LAB — HEPARIN LEVEL (UNFRACTIONATED)
Heparin Unfractionated: 0.55 IU/mL (ref 0.30–0.70)
Heparin Unfractionated: 0.57 IU/mL (ref 0.30–0.70)

## 2013-12-08 MED ORDER — VANCOMYCIN HCL IN DEXTROSE 1-5 GM/200ML-% IV SOLN
1000.0000 mg | INTRAVENOUS | Status: AC
  Administered 2013-12-09: 1000 mg via INTRAVENOUS
  Filled 2013-12-08: qty 200

## 2013-12-08 MED ORDER — METHOCARBAMOL 500 MG PO TABS
500.0000 mg | ORAL_TABLET | Freq: Three times a day (TID) | ORAL | Status: DC
  Administered 2013-12-08 – 2013-12-10 (×6): 500 mg via ORAL
  Filled 2013-12-08 (×9): qty 1

## 2013-12-08 MED ORDER — HEPARIN (PORCINE) IN NACL 100-0.45 UNIT/ML-% IJ SOLN
1100.0000 [IU]/h | INTRAMUSCULAR | Status: DC
  Administered 2013-12-09: 1100 [IU]/h via INTRAVENOUS
  Filled 2013-12-08: qty 250

## 2013-12-08 MED ORDER — HEPARIN (PORCINE) IN NACL 100-0.45 UNIT/ML-% IJ SOLN
1200.0000 [IU]/h | INTRAMUSCULAR | Status: DC
  Filled 2013-12-08: qty 250

## 2013-12-08 NOTE — Progress Notes (Addendum)
ANTICOAGULATION CONSULT NOTE - Follow Up Consult  Pharmacy Consult for heparin (warfarin on hold) Indication: history of DVT  Allergies  Allergen Reactions  . Codeine Nausea And Vomiting, Swelling and Rash  . Hydrocodone Nausea And Vomiting, Swelling and Rash    "mouth peeled"  . Morphine And Related Other (See Comments)    hallucinations  . Other Nausea And Vomiting    Most pain meds! other than tramadol. Tylenol, Toradol, and Robaxin are ok.    . Penicillins Hives  . Demerol [Meperidine] Nausea And Vomiting  . Propoxyphene Hives and Nausea And Vomiting  . Statins Other (See Comments)    INTOLERANT OF STATINS - messes up her legs and has muscle aches  . Tape Other (See Comments)    Needs paper tape - skin very fragile    Patient Measurements: Height: 5\' 3"  (160 cm) Weight: 115 lb 4.8 oz (52.3 kg) IBW/kg (Calculated) : 52.4 Heparin dosing weight = 52kg  Vital Signs: Temp: 98.3 F (36.8 C) (10/19 1338) Temp Source: Oral (10/19 1338) BP: 119/50 mmHg (10/19 1338) Pulse Rate: 75 (10/19 1338)  Labs:  Recent Labs  12/06/13 0421  12/07/13 0510  12/07/13 2114 12/08/13 0432 12/08/13 1709  HGB  --   --  9.6*  --   --  10.0*  --   HCT  --   --  28.4*  --   --  30.1*  --   PLT  --   --  193  --   --  180  --   LABPROT 15.8*  --  19.3*  --   --  18.2*  --   INR 1.25  --  1.61*  --   --  1.49  --   HEPARINUNFRC  --   < > 0.34  < > 0.42 0.55 0.57  CREATININE  --   --  0.46*  --   --   --   --   < > = values in this interval not displayed.  Estimated Creatinine Clearance: 45.5 ml/min (by C-G formula based on Cr of 0.46).   Infusions:  . sodium chloride 10 mL/hr at 12/07/13 1010  . heparin 1,200 Units/hr (12/08/13 0851)    Assessment: 73 yoF presented to ED on 10/12 with abdominal pain, cramping, poor PO intake.  PMH includes angina, iron deficiency, HTN, HLD, COPD, DVT on warfarin, GERD, stroke, RA, OA, DJD.  Warfarin dose PTA was 3mg  daily, except 4.5mg  on Mon/Wed;  therapeutic INR on admission.  Warfarin held for possible procedure.  Pharmacy consulted to start heparin when INR <2.0.  With no further plans for procedure, pharmacy is consulted to resume warfarin dosing on 10/16.  Today, 10/19  Heparin level = 0.57 (therapeutic) on 1200 units/hr  INR = 1.49  CBC:  Hgb = 10 (low, stable) and Plt WNL.     RN reports no bleeding or complications.  + hematoma of R rectus abdominis following fall.   Heparin gtt stopped 10/16 per MD - do NOT bridge with parenteral anticoagulation d/t hematoma, MD changed mind 10/17 and asked for pharmacy to dose until INR > 1.8. Shooting for lower end heparin level goal (per d/w MD)  Started diclofenac patch 10/16  Goal of Therapy:  Heparin level 0.3-0.5 units/ml Monitor platelets by anticoagulation protocol: Yes   Plan:   Based on lower heparin goal, reduce heparin gtt to 1100 units/hr  Follow-up with heparin level with am lab  Per nursing order (from IR), stop heparin at 0700  10/20 for vertebroplasty  Daily INR, CBC, and heparin level  Juliette Alcide, PharmD, BCPS.   Pager: [phone] 12/08/2013 5:41 PM

## 2013-12-08 NOTE — Progress Notes (Signed)
[name] QTM:[id number] DOB: Apr 25, 1930 DOA: 12/01/2013 PCP: Hoyle Sauer, MD  Brief narrative: 80 y/o ? h/o R sided chronic DTt's on Lifelong coumadin, CAd s/p PCI RCA 2004, COPD, Prediabetes base don A1c 08/2013, Htn, GERD, Diverticular disease, Rh Arthritis on plaquenil, Barrett's esophagus, PAD, Fe def anemia-Hip surgery 03/2012 with removal painful Hardware R hip, HLDwith statin inteolerance, Abdominal aortic aneurysm, prior L4-5 stenosis admitted 12/02/13 with  H/o fall and resulting back pain 3 weeks pta.  She does have some difficulty remembering when she fell off and on one side but thinks it was in her right. She was admitted and found to have significant abdominal pain potentially secondary to rectus abdominus bruising with a hematoma palpable in the right rectus abdominis IR was consulted for evaluation of her T9/T10 pain and potential vertebroplasty but patient has been able to lay on to stomach because of rectus abdominis pain.  eventually her abd pain was better with slow resolution of the abdominal pain and IR was re-consulted to see her 12/08/13  Past medical history-As per Problem list Chart reviewed as below- Reviewed  Consultants:  Interventional radiology  Procedures:  CT scan abdomen pelvis, CT angiogram abdomen pelvis  Antibiotics:  None   Subjective   Pain 3/10 Looks and feels much better Daughter noticing a little tremulousness Eating hamburger at beside   Objective    Interim History: Reviewed  Telemetry: Sinus rhythm/sinus tach   Objective: Filed Vitals:   12/08/13 0438 12/08/13 0814 12/08/13 1338 12/08/13 1444  BP: 190/56  119/50   Pulse: 78  75   Temp: 98.3 F (36.8 C)  98.3 F (36.8 C)   TempSrc: Oral  Oral   Resp: 18  18   Height:      Weight: 52.3 kg (115 lb 4.8 oz)     SpO2: 98% 93% 100% 96%    Intake/Output Summary (Last 24 hours) at 12/08/13 1807 Last data filed at 12/08/13 1500  Gross per 24 hour  Intake  603.5  ml  Output      0 ml  Net  603.5 ml    Exam:  General: Alert frail pleasant Caucasian female.  AAAx4 Cardiovascular: S1-S2 no murmur rub or gallop Respiratory: Clinically clear no added sound Abdomen: less tender 12th rib right side  Skin see above Neuro 5/5 power throughout, sensory grossly throughout normal  Data Reviewed: Basic Metabolic Panel:  Recent Labs Lab 12/02/13 0640 12/07/13 0510  NA 135* 139  K 3.9 3.4*  CL 99 105  CO2 25 23  GLUCOSE 114* 102*  BUN 5* 5*  CREATININE 0.47* 0.46*  CALCIUM 8.9 8.8  PHOS  --  3.8   Liver Function Tests:  Recent Labs Lab 12/07/13 0510  ALBUMIN 2.8*   No results found for this basename: LIPASE, AMYLASE,  in the last 168 hours No results found for this basename: AMMONIA,  in the last 168 hours CBC:  Recent Labs Lab 12/02/13 0640 12/03/13 0540 12/04/13 0345 12/07/13 0510 12/08/13 0432  WBC 5.7 4.4 4.8 4.5 4.8  NEUTROABS  --   --   --  2.7  --   HGB 11.2* 10.2* 10.1* 9.6* 10.0*  HCT 33.9* 30.7* 30.9* 28.4* 30.1*  MCV 86.7 88.0 89.3 87.4 89.6  PLT 204 172 180 193 180   Cardiac Enzymes:  Recent Labs Lab 12/02/13 0640 12/02/13 0956 12/02/13 1555  TROPONINI <0.30 <0.30 <0.30   BNP: No components found with this basename: POCBNP,  CBG: No results found  for this basename: GLUCAP,  in the last 168 hours  Recent Results (from the past 240 hour(s))  URINE CULTURE     Status: None   Collection Time    12/01/13 10:23 PM      Result Value Ref Range Status   Specimen Description URINE, CLEAN CATCH   Final   Special Requests NONE   Final   Culture  Setup Time     Final   Value: 12/03/2013 01:27     Performed at [facility] Count     Final   Value: >=100,000 COLONIES/ML     Performed at [facility]   Culture     Final   Value: Multiple bacterial morphotypes present, none predominant. Suggest appropriate recollection if clinically indicated.     Performed at [facility]    Report Status 12/03/2013 FINAL   Final     Studies:              All Imaging reviewed and is as per above notation   Scheduled Meds: . diclofenac  1 patch Transdermal BID  . escitalopram  5 mg Oral QPM  . hydroxychloroquine  200 mg Oral BID  . ipratropium-albuterol  3 mL Nebulization TID  . isosorbide mononitrate  30 mg Oral q morning - 10a  . lactose free nutrition  237 mL Oral Q24H  . lisinopril  10 mg Oral Daily  . methocarbamol  500 mg Oral TID  . multivitamin with minerals  1 tablet Oral Q1200  . omeprazole  20 mg Oral q morning - 10a  . polyethylene glycol  17 g Oral Daily  . senna  1 tablet Oral BID  . sodium chloride  3 mL Intravenous Q12H  . sulfaSALAzine  500 mg Oral TID  . [START ON 12/09/2013] vancomycin  1,000 mg Intravenous On Call   Continuous Infusions: . sodium chloride 10 mL/hr at 12/07/13 1010  . heparin       Assessment/Plan:  1. Rectus abdominis hematoma-  Patient now transitioned from multiple medications as multiple intolerances to current regimen of Tylenol 650 every 6 when necessary, Dilaudid by mouth 2 mg every 4 hours, tramadol 50-100 every 6 when necessary.  Add K pad, diclofenac patch to see if this helps.  D/c lidoderm patch.   2. R sided Chronic DVT's-INR today 1.49 with Vit K 2.5. X 1 dose on 12/08/13.  Continue heparin gtt-for  T9,T10 vertebroplasty on  10/20  3. Tremulousness-d/c Robaxin, lidoderm-monitor in am 4. CAD s/p PCI 2004-only on Coumadin monotherapy.  From last cardiology visit does not need to be on antiplatelet agent . Continue Imdur 30 mg every morning 5. Asymptomatic bacteriuria on admit-No need to Rx as no systemic signs/sym illness 6. COPD-former smoker quit 2002 -continue duo neb 3 times a day  7. Barrett's esophagus and history diverticulitis -outpatient followup-currently stable 8. Rheumatoid arthritis -continue Plaquenil 200 twice a day , sulfasalazine 500 3 times a day 9. Constipation-d/c Dulcolax suppository,d/c Colace 100  twice a day, d/c Linzess 290 every morning.  Continua Senna/miralax- Good effect with Sorbitol-scale back to once daily and give on d/c home. 10. Reflux-continue omeprazole 20 every morning  11. Probable bipolar 1-continue Lexapro 5 mg every afternoon 12. Multiple orthopedic issues-hip surgery 03/2012, prior L4-5 stenosis-currently stable. management as above 13. Diabetes mellitus -not currently on controlling medications.    14. Hypertension-see #3    Code Status: Full Family Communication:  No family present Disposition Plan:  inpt-pending Ir procedure   Pleas Koch, MD  [facility] Pager [phone] 12/08/2013, 6:07 PM    LOS: 7 days

## 2013-12-08 NOTE — Progress Notes (Signed)
Patient ID: [name], female   DOB: [birth year], 80 y.o.   MRN: [id number]   Referring Physician(s): Dr. Verlon Au  Subjective:  Pt still with c/o mid back pain  Allergies: Codeine; Hydrocodone; Morphine and related; Other; Penicillins; Demerol; Propoxyphene; Statins; and Tape  Medications: Prior to Admission medications   Medication Sig Start Date End Date Taking? Authorizing Provider  Calcium Carbonate-Vitamin D (CALTRATE 600+D) 600-400 MG-UNIT per tablet Take 1 tablet by mouth every evening.    Yes Historical Provider, MD  Camphor-Menthol-Methyl Sal (SALONPAS) 1.2-5.7-6.3 % PTCH Place 1 patch onto the skin daily as needed (For pain.).   Yes Historical Provider, MD  cholecalciferol (VITAMIN D) 1000 UNITS tablet Take 2,000 Units by mouth every morning.   Yes Historical Provider, MD  docusate sodium (COLACE) 100 MG capsule Take 100 mg by mouth 2 (two) times daily.    Yes Historical Provider, MD  escitalopram (LEXAPRO) 5 MG tablet Take 5 mg by mouth every evening.    Yes Historical Provider, MD  Flaxseed, Linseed, (FLAX SEED OIL PO) Take 1 tablet by mouth daily at 12 noon.    Yes Historical Provider, MD  hydroxychloroquine (PLAQUENIL) 200 MG tablet Take 200 mg by mouth 2 (two) times daily.    Yes Historical Provider, MD  isosorbide mononitrate (IMDUR) 30 MG 24 hr tablet Take 30 mg by mouth every morning.   Yes Historical Provider, MD  Linaclotide Rolan Lipa) 145 MCG CAPS capsule Take 290 mcg by mouth every morning.    Yes Historical Provider, MD  magnesium citrate SOLN Take 1 Bottle by mouth once.   Yes Historical Provider, MD  methocarbamol (ROBAXIN) 500 MG tablet Take 1 tablet (500 mg total) by mouth 3 (three) times daily as needed (For muscle spasms.). 01/01/13  Yes Erskine Emery, PA-C  Multiple Vitamin (MULTIVITAMIN WITH MINERALS) TABS tablet Take 1 tablet by mouth daily at 12 noon.   Yes Historical Provider, MD  omeprazole (PRILOSEC OTC) 20 MG tablet Take 20 mg by mouth every morning.     Yes Historical Provider, MD  polyvinyl alcohol (LIQUIFILM TEARS) 1.4 % ophthalmic solution Place 1 drop into both eyes as needed (Dry eyes.). 04/15/12  Yes Srikar Janna Arch, MD  promethazine (PHENERGAN) 12.5 MG tablet Take 12.5 mg by mouth every 6 (six) hours as needed for nausea (Patient takes with Tramadol).   Yes Historical Provider, MD  sulfaSALAzine (AZULFIDINE) 500 MG tablet Take 500 mg by mouth 3 (three) times daily.    Yes Historical Provider, MD  warfarin (COUMADIN) 3 MG tablet Take 1 tablet (3 mg total) by mouth See admin instructions. States takes Monday, Wednesday 4.5 mg Tuesday, Thursday Friday Sat Sun 3 mg. Get your INR checked by PCP in 1 day. 08/26/13  Yes Thurnell Lose, MD  diclofenac (FLECTOR) 1.3 % PTCH Place 1 patch onto the skin 2 (two) times daily. 12/06/13   Nita Sells, MD  glucose monitoring kit (FREESTYLE) monitoring kit 1 each by Does not apply route 4 (four) times daily - after meals and at bedtime. 1 month Diabetic Testing Supplies for QAC-QHS accuchecks.Any brand OK 08/23/13   Thurnell Lose, MD  HYDROmorphone (DILAUDID) 2 MG tablet Take 1 tablet (2 mg total) by mouth every 4 (four) hours as needed for severe pain. 12/06/13   Nita Sells, MD  lisinopril (PRINIVIL,ZESTRIL) 10 MG tablet Take 1 tablet (10 mg total) by mouth daily. 12/06/13   Nita Sells, MD  polyethylene glycol (MIRALAX / GLYCOLAX) packet Take 17 g by mouth  daily. 12/06/13   Nita Sells, MD  traMADol (ULTRAM) 50 MG tablet Take 2 tablets (100 mg total) by mouth every 6 (six) hours as needed for moderate pain. 12/06/13   Nita Sells, MD  warfarin (COUMADIN) 5 MG tablet Take 1 tablet (5 mg total) by mouth daily. 12/06/13   Nita Sells, MD    Review of Systems  Constitutional: Negative for fever and chills.  Respiratory:       Occ cough/dyspnea  Cardiovascular: Negative for chest pain.  Gastrointestinal: Negative for nausea, vomiting and blood in stool.        Mild abd discomfort  Genitourinary: Negative for dysuria and hematuria.  Musculoskeletal: Positive for back pain.  Neurological: Negative for numbness and headaches.    Vital Signs: BP 190/56  Pulse 78  Temp(Src) 98.3 F (36.8 C) (Oral)  Resp 18  Ht $R'5\' 3"'ZT$  (1.6 m)  Wt 115 lb 4.8 oz (52.3 kg)  BMI 20.43 kg/m2  SpO2 93%  Physical Exam  Constitutional: She is oriented to person, place, and time. She appears well-developed and well-nourished.  Cardiovascular: Normal rate and regular rhythm.   Pulmonary/Chest: Effort normal.  Few bibasilar crackles  Abdominal: Soft. Bowel sounds are normal.  Mild diffuse tenderness  Musculoskeletal: Normal range of motion.  Mild- moderate paravertebral tenderness along lower thoracic spine  Neurological: She is alert and oriented to person, place, and time.    Imaging: Mr Thoracic Spine Wo Contrast  12/05/2013   CLINICAL DATA:  Fall 3 weeks ago. T9 fracture. Subsequent encounter  EXAM: MRI THORACIC SPINE WITHOUT CONTRAST  TECHNIQUE: Multiplanar, multisequence MR imaging of the thoracic spine was performed. No intravenous contrast was administered.  COMPARISON:  CT of the chest 12/02/2013.  FINDINGS: Normal signal is present throughout the thoracic spinal cord to the conus medullaris which terminates at L1-2, within normal limits.  Vertebral augmentation is evident at T7. There is a new burst fracture scratch the there is a new compression fracture at T9 with diffuse marrow edema. There slight retropulsion of bone which effaces the ventral CSF at this level. A fracture is noted along the anterior inferior endplate of T8 as well. A hemangioma is present posteriorly within the T8 vertebral body. An inferior endplate fracture at T6 is chronic.  Facet hypertrophy and exaggerated kyphosis lead to mild central and bilateral foraminal narrowing at T8-9, right greater than left.  T9-10: Retropulsed bone and facet hypertrophy results in moderate right and mild left  foraminal stenosis.  T10-11: A shallow left paramedian disc protrusion is present without significant stenosis.  Slight retrolisthesis and uncovering of a broad-based protrusion is present at L1-2. Mild subarticular stenosis is evident.  IMPRESSION: 1. New osteoporotic compression fracture at T9 with slight retropulsion of bone resulting and mild central canal stenosis. There is diffuse edema throughout the vertebral body. 2. Acute/subacute anterior inferior endplate compression fracture at T8. 3. Mild central and bilateral foraminal narrowing at T8-9. 4. Moderate right and mild left foraminal stenosis at T9-10. 5. Shallow left paramedian disc protrusion at T10-11 without significant stenosis. 6. Uncovering broad-based disc protrusion with mild subarticular narrowing bilaterally at L1-2.   Electronically Signed   By: Lawrence Santiago M.D.   On: 12/05/2013 15:45    Labs:  CBC:  Recent Labs  12/03/13 0540 12/04/13 0345 12/07/13 0510 12/08/13 0432  WBC 4.4 4.8 4.5 4.8  HGB 10.2* 10.1* 9.6* 10.0*  HCT 30.7* 30.9* 28.4* 30.1*  PLT 172 180 193 180    COAGS:  Recent Labs  12/24/12 1420  12/05/13 0255 12/06/13 0421 12/07/13 0510 12/08/13 0432  INR 1.42  < > 1.35 1.25 1.61* 1.49  APTT 43*  --   --   --   --   --   < > = values in this interval not displayed.  BMP:  Recent Labs  08/21/13 0356 08/22/13 0551 12/01/13 1723 12/02/13 0640 12/07/13 0510  NA 133*  --  134* 135* 139  K 3.6* 4.9 4.5 3.9 3.4*  CL 96  --  96 99 105  CO2 23  --  _0 GLUCOSE 100*  --  104* 114* 102*  BUN 7  --  8 5* 5*  CALCIUM 8.7  --  9.2 8.9 8.8  CREATININE 0.52  --  0.53 0.47* 0.46*  GFRNONAA 87*  --  87* >90 >90  GFRAA >90  --  >90 >90 >90    LIVER FUNCTION TESTS:  Recent Labs  08/21/13 0356 12/01/13 1723 12/07/13 0510  BILITOT 0.6 0.3  --   AST 30 22  --   ALT 23 12  --   ALKPHOS 76 91  --   PROT 5.9* 6.8  --   ALBUMIN 2.8* 3.5 2.8*    Assessment and Plan: Pt with history of  osteoporosis, prior T7 VP 2002, rectus abdominis hematoma, RLE DVT 2014- currently on IV heparin, recent fall onto bed, symptomatic T8/9 compression fractures. Recent imaging studies have been reviewed by Dr. Vernard Gambles and tent plan is for T8/9 VP/KP on 10/20. Details/risks of procedure d/w pt with her understanding and consent. Will plan to hold IV heparin for 6 hours preprocedure. See IR consult note from 10/14 for additional details of hx.      I spent a total of 15 minutes face to face in clinical consultation/evaluation, greater than 50% of which was counseling/coordinating care for thoracic (T8/9) vertebropalsty/kyphoplasty .  Signed: Autumn Messing 12/08/2013, 11:10 AM

## 2013-12-08 NOTE — Progress Notes (Signed)
ANTICOAGULATION CONSULT NOTE - Follow Up Consult  Pharmacy Consult Heparin Indication: history of DVT  Assessment: 35 yoF presented to ED on 10/12 with Hx fall and back/abdominal pain; found to have T9 compression Fx.  Also with hematoma in rectus abdominus.  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  PTA warfarin 3mg  daily, except 4.5mg  on Mon/Wed; therapeutic INR on admission.  Warfarin held while decision was made regarding vertebroplasty, and UFH drip was started.  Initial plan was medical management, and warfarin resumed 10/16, but patient changed mind 10/18 and warfarin stopped.  Now continuing on heparin pending IR decision today; currently plans are for surgery tomorrow 10/20.  Started diclofenac patch 10/16.  Today, 10/19:  Plt wnl; Hgb slightly low but stable  HL therapeutic but over goal range 0.3-0.5 requested by MD  INR non-therapeutic  No bleeding issues noted per nursing  Goal of Therapy:  Heparin level 0.3-0.5 units/ml Monitor platelets by anticoagulation protocol: Yes   Plan:   Reduce heparin infusion rate to 1200 units/hr to maintain lower goal range 0.3 - 0.5  Repeat heparin level in 8 hrs at 1700 (if still running)  Continue to hold warfarin pending IR plans for VP  Daily INR, CBC, and heparin level  Continue to monitor H&H and platelets  -------------------------------------------------------  Heparin rate/levels  10/14: HL 0.14 @ 800 units/hr  10/14: HL 0.27 @ 900 units/hr, interruptions noted  10/14: HL 0.22 recheck  10/15: HL 0.29 @ 1000 units/hr  10/15: HL 0.49 @ 1100 units/hr  10/15: MD wants lower goal range > 1000 units/hr  10/16: 0.28 @ 1000 units hr  Heparin stopped 10/16 by MD d/t hematoma, resumed 10/17  10/17: HL 0.24 @ 1050 units/hr  10/18: HL 0.34 @ 1150 units/hr  10/18: HL 0.28 @ 1150 units/hr  10/18: HL 0.42 @ 1250 units/hr  10/19: DHL 0.55 @ 1250 units/hr  Warfarin  doses/INR  10/12: INR 2.24  10/14: INR 1.9  10/16: INR 1.35, warfarin resumed; 5 mg given  10/17: INR 1.25, 4 mg given  10/18: INR 1.61, warfarin stopped; vit K 2.5 mg given  10/19: INR 1.49   Allergies  Allergen Reactions  . Codeine Nausea And Vomiting, Swelling and Rash  . Hydrocodone Nausea And Vomiting, Swelling and Rash    "mouth peeled"  . Morphine And Related Other (See Comments)    hallucinations  . Other Nausea And Vomiting    Most pain meds! other than tramadol. Tylenol, Toradol, and Robaxin are ok.    . Penicillins Hives  . Demerol [Meperidine] Nausea And Vomiting  . Propoxyphene Hives and Nausea And Vomiting  . Statins Other (See Comments)    INTOLERANT OF STATINS - messes up her legs and has muscle aches  . Tape Other (See Comments)    Needs paper tape - skin very fragile    Patient Measurements: Height: 5\' 3"  (160 cm) Weight: 115 lb 4.8 oz (52.3 kg) IBW/kg (Calculated) : 52.4 Heparin dosing weight = 52kg  Vital Signs: Temp: 98.3 F (36.8 C) (10/19 0438) Temp Source: Oral (10/19 0438) BP: 190/56 mmHg (10/19 0438) Pulse Rate: 78 (10/19 0438)  Labs:  Recent Labs  12/06/13 0421  12/07/13 0510 12/07/13 1151 12/07/13 2114 12/08/13 0432  HGB  --   --  9.6*  --   --  10.0*  HCT  --   --  28.4*  --   --  30.1*  PLT  --   --  193  --   --  180  LABPROT 15.8*  --  19.3*  --   --  18.2*  INR 1.25  --  1.61*  --   --  1.49  HEPARINUNFRC  --   < > 0.34 0.28* 0.42 0.55  CREATININE  --   --  0.46*  --   --   --   < > = values in this interval not displayed.  Estimated Creatinine Clearance: 45.5 ml/min (by C-G formula based on Cr of 0.46).   Infusions:  . sodium chloride 10 mL/hr at 12/07/13 1010  . heparin 1,250 Units/hr (12/08/13 0555)   Bernadene Person, PharmD Pager: [phone] 12/08/2013, 7:49 AM

## 2013-12-08 NOTE — Progress Notes (Signed)
Physical Therapy Treatment Patient Details Name: [name] MRN: [id number] DOB: 1930/10/18 Today's Date: 12/08/2013    History of Present Illness 80 y/o Fadmitted 12/01/13 with PMhx of angina, iron deficiency, HTN, HLD, COPD, DVT on coumadin, GERD, stroke, RA, OA, DJD presented to [facility] ED with main concern of several days duration of persistent abd discomfort, associated with cramping, poor oral intake. [name] Recent T9 compression fracture, being concidered for Kyphoplasty.    PT Comments    Patient's mobility and tolerance is much improved. Ambulated x 250'. Per chart, possible KP/VP tomorrow.  Follow Up Recommendations  Home health PT;Supervision/Assistance - 24 hour     Equipment Recommendations  None recommended by PT    Recommendations for Other Services       Precautions / Restrictions Precautions Precautions: Fall;Back Restrictions Weight Bearing Restrictions: No    Mobility  Bed Mobility                  Transfers       Sit to Stand: Supervision         General transfer comment: decreased descent to recliner  Ambulation/Gait Ambulation/Gait assistance: Min guard Ambulation Distance (Feet): 250 Feet Assistive device: Rolling walker (2 wheeled) Gait Pattern/deviations: Step-through pattern     General Gait Details: much improved today with gait and use of RW   Stairs            Wheelchair Mobility    Modified Rankin (Stroke Patients Only)       Balance           Standing balance support: During functional activity;No upper extremity supported Standing balance-Leahy Scale: Fair                      Cognition Arousal/Alertness: Awake/alert                          Exercises      General Comments        Pertinent Vitals/Pain Pain Score: 5  Pain Location: back Pain Descriptors / Indicators: Aching Pain Intervention(s): Premedicated before session;Repositioned;Monitored during session    Home  Living                      Prior Function            PT Goals (current goals can now be found in the care plan section) Progress towards PT goals: Progressing toward goals    Frequency  Min 3X/week    PT Plan Current plan remains appropriate    Co-evaluation             End of Session Equipment Utilized During Treatment: Gait belt Activity Tolerance: Patient tolerated treatment well Patient left: in chair;with call bell/phone within reach;with chair alarm set     Time: 1114-1130 PT Time Calculation (min): 16 min  Charges:  $Gait Training: 8-22 mins                    G Codes:      Rada Hay 12/08/2013, 12:49 PM Blanchard Kelch PT [phone]

## 2013-12-09 ENCOUNTER — Inpatient Hospital Stay (HOSPITAL_COMMUNITY): Payer: Medicare Other

## 2013-12-09 LAB — BASIC METABOLIC PANEL
ANION GAP: 12 (ref 5–15)
BUN: 6 mg/dL (ref 6–23)
CHLORIDE: 105 meq/L (ref 96–112)
CO2: 23 meq/L (ref 19–32)
Calcium: 8.7 mg/dL (ref 8.4–10.5)
Creatinine, Ser: 0.55 mg/dL (ref 0.50–1.10)
GFR calc non Af Amer: 86 mL/min — ABNORMAL LOW (ref >90)
Glucose, Bld: 104 mg/dL — ABNORMAL HIGH (ref 70–99)
POTASSIUM: 3.7 meq/L (ref 3.7–5.3)
Sodium: 140 mEq/L (ref 137–147)

## 2013-12-09 LAB — CBC
HCT: 29.2 % — ABNORMAL LOW (ref 36.0–46.0)
HEMOGLOBIN: 9.7 g/dL — AB (ref 12.0–15.0)
MCH: 29.7 pg (ref 26.0–34.0)
MCHC: 33.2 g/dL (ref 30.0–36.0)
MCV: 89.3 fL (ref 78.0–100.0)
Platelets: 198 10*3/uL (ref 150–400)
RBC: 3.27 MIL/uL — AB (ref 3.87–5.11)
RDW: 15.4 % (ref 11.5–15.5)
WBC: 4.8 10*3/uL (ref 4.0–10.5)

## 2013-12-09 LAB — HEPARIN LEVEL (UNFRACTIONATED): HEPARIN UNFRACTIONATED: 0.56 [IU]/mL (ref 0.30–0.70)

## 2013-12-09 LAB — PROTIME-INR
INR: 1.23 (ref 0.00–1.49)
Prothrombin Time: 15.6 seconds — ABNORMAL HIGH (ref 11.6–15.2)

## 2013-12-09 LAB — APTT: aPTT: 187 seconds — ABNORMAL HIGH (ref 24–37)

## 2013-12-09 MED ORDER — ONDANSETRON HCL 4 MG/2ML IJ SOLN
INTRAMUSCULAR | Status: AC | PRN
  Administered 2013-12-09: 4 mg via INTRAVENOUS

## 2013-12-09 MED ORDER — ONDANSETRON HCL 4 MG/2ML IJ SOLN
INTRAMUSCULAR | Status: AC
  Filled 2013-12-09: qty 2

## 2013-12-09 MED ORDER — NALOXONE HCL 0.4 MG/ML IJ SOLN
INTRAMUSCULAR | Status: AC
  Filled 2013-12-09: qty 1

## 2013-12-09 MED ORDER — BOOST PLUS PO LIQD
237.0000 mL | Freq: Two times a day (BID) | ORAL | Status: DC | PRN
  Filled 2013-12-09: qty 237

## 2013-12-09 MED ORDER — NALOXONE HCL 0.4 MG/ML IJ SOLN
INTRAMUSCULAR | Status: AC | PRN
  Administered 2013-12-09: 0.2 mg via INTRAVENOUS

## 2013-12-09 MED ORDER — MIDAZOLAM HCL 2 MG/2ML IJ SOLN
INTRAMUSCULAR | Status: AC
Start: 2013-12-09 — End: 2013-12-10
  Filled 2013-12-09: qty 8

## 2013-12-09 MED ORDER — LIDOCAINE HCL (PF) 1 % IJ SOLN
INTRAMUSCULAR | Status: AC
  Filled 2013-12-09: qty 30

## 2013-12-09 MED ORDER — FENTANYL CITRATE 0.05 MG/ML IJ SOLN
INTRAMUSCULAR | Status: AC | PRN
  Administered 2013-12-09: 100 ug via INTRAVENOUS
  Administered 2013-12-09 (×3): 50 ug via INTRAVENOUS

## 2013-12-09 MED ORDER — FLUMAZENIL 0.5 MG/5ML IV SOLN
INTRAVENOUS | Status: AC
  Filled 2013-12-09: qty 5

## 2013-12-09 MED ORDER — MIDAZOLAM HCL 2 MG/2ML IJ SOLN
INTRAMUSCULAR | Status: AC | PRN
  Administered 2013-12-09 (×2): 1 mg via INTRAVENOUS

## 2013-12-09 MED ORDER — DICLOFENAC EPOLAMINE 1.3 % TD PTCH
1.0000 | MEDICATED_PATCH | Freq: Once | TRANSDERMAL | Status: DC
  Filled 2013-12-09: qty 1

## 2013-12-09 MED ORDER — FENTANYL CITRATE 0.05 MG/ML IJ SOLN
INTRAMUSCULAR | Status: AC
  Filled 2013-12-09: qty 8

## 2013-12-09 NOTE — Sedation Documentation (Signed)
Pt is prone. Pt neck is stiff, in awkward position. Initial sedation, decrease in O2 sats, poor position for optimal chest rise or head turn. Pt. Attempting to follow commands for deep breathing, O2 sats are slow to respond. HR increases for compensation from 80 to 101. Sats are registering in the 60's. Narcan 0.2 given to alleviate  poor oxygenation. Reversal successful. A small dose of pain medication given to continue procedure. Continuing to monitor closely.

## 2013-12-09 NOTE — Progress Notes (Signed)
Pharmacy - Heparin  Patient previously on IV heparin for DVT.  S/P Kyphoplasty today.  Per Dr Deanne Coffer, plans to resume IV heparin on 10/21 if stable.  Will f/u in AM  Terrilee Files, PharmD 12/09/13 @ 18:36

## 2013-12-09 NOTE — Progress Notes (Signed)
ANTICOAGULATION CONSULT NOTE - Follow Up Consult  Pharmacy Consult for Heparin Indication: DVT  Allergies  Allergen Reactions  . Codeine Nausea And Vomiting, Swelling and Rash  . Hydrocodone Nausea And Vomiting, Swelling and Rash    "mouth peeled"  . Morphine And Related Other (See Comments)    hallucinations  . Other Nausea And Vomiting    Most pain meds! other than tramadol. Tylenol, Toradol, and Robaxin are ok.    . Penicillins Hives  . Demerol [Meperidine] Nausea And Vomiting  . Propoxyphene Hives and Nausea And Vomiting  . Statins Other (See Comments)    INTOLERANT OF STATINS - messes up her legs and has muscle aches  . Tape Other (See Comments)    Needs paper tape - skin very fragile    Patient Measurements: Height: 5\' 3"  (160 cm) Weight: 115 lb 4.8 oz (52.3 kg) IBW/kg (Calculated) : 52.4 Heparin Dosing Weight:   Vital Signs: Temp: 98.7 F (37.1 C) (10/19 2053) Temp Source: Oral (10/19 2053) BP: 131/85 mmHg (10/19 2053) Pulse Rate: 77 (10/19 2053)  Labs:  Recent Labs  12/07/13 0510  12/08/13 0432 12/08/13 1709 12/09/13 0443  HGB 9.6*  --  10.0*  --  9.7*  HCT 28.4*  --  30.1*  --  29.2*  PLT 193  --  180  --  198  LABPROT 19.3*  --  18.2*  --   --   INR 1.61*  --  1.49  --   --   HEPARINUNFRC 0.34  < > 0.55 0.57 0.56  CREATININE 0.46*  --   --   --   --   < > = values in this interval not displayed.  Estimated Creatinine Clearance: 45.5 ml/min (by C-G formula based on Cr of 0.46).   Medications:  Infusions:  . sodium chloride 10 mL/hr at 12/07/13 1010    Assessment: Patient with heparin level above reduced goal.  No issues per RN.   Goal of Therapy:  Heparin level 0.3-0.5 units/ml Monitor platelets by anticoagulation protocol: Yes   Plan:  Due to level high and heparin to be turned off at 0700, informed RN to stop heparin now.   Will plan to f/u with heparin orders after procedure.  12/09/13, [name] Crowford 12/09/2013,6:16 AM

## 2013-12-09 NOTE — Progress Notes (Signed)
NUTRITION FOLLOW UP  Intervention:   -Recommend to modify Boost Plus to PRN as pt's appetite is improving -Encouraged PO intake -RD to continue to monitor  Nutrition Dx:   Inadequate oral intake related to abd pain/constipation as evidenced by decreased PO intake for one week; improving with regular BMs   Goal:   Pt to meet >/= 90% of their estimated nutrition needs; progressing    Monitor:   Total protein/energy intake, labs, weights, GI profile  Assessment:   10/13: -Pt reported poor PO intake for one week d/t constipation, abd pain, and medications  -Diet recall indicated pt consuming two meals daily and one Boost supplement daily. Lives with sister, who assists in preparing meals and obtaining supplements for pt.  -Endorsed weight loss, pt reported 7 lbs in several weeks; however pt's daughter noted it may have been < 5 lbs. Previous medical records indicates pt's usual body weight around 116-118 lb (3 lb weight loss, non-severe for time frame)  -Appetite improving during admit, consumed 100% of breakfast. Receiving bowel regimen to assist with constipation and abd pain relief. Requesting Boost supplement; however would like to receive it tomorrow.  -Encouraged fluid and fiber for regular BMs; also promoted nutrition for assistance in T9 compression fracture recovery  10/20: -Pt reported increase with appetite as her pain has gradually improved -PO intake ranges from 25-100%. Has consumed one Boost, and has remainder of supplements at bedside. Will modify order to PRN, currently NPO for procedure (vertebroplasty) -Constipation improving, MD recommended to continue with Senna and d/c'd dulcolax, colace, and Linzess  Height: Ht Readings from Last 1 Encounters:  12/02/13 5\' 3"  (1.6 m)    Weight Status:   Wt Readings from Last 1 Encounters:  12/08/13 115 lb 4.8 oz (52.3 kg)    Re-estimated needs:  Kcal: 1300-1500  Protein: 60-70 gram  Fluid: 1500 ml daily   Skin:  WDL  Diet Order: NPO   Intake/Output Summary (Last 24 hours) at 12/09/13 1240 Last data filed at 12/09/13 0600  Gross per 24 hour  Intake 494.58 ml  Output      0 ml  Net 494.58 ml    Last BM: 10/19   Labs:   Recent Labs Lab 12/07/13 0510 12/09/13 0443  NA 139 140  K 3.4* 3.7  CL 105 105  CO2 23 23  BUN 5* 6  CREATININE 0.46* 0.55  CALCIUM 8.8 8.7  PHOS 3.8  --   GLUCOSE 102* 104*    CBG (last 3)  No results found for this basename: GLUCAP,  in the last 72 hours  Scheduled Meds: . diclofenac  1 patch Transdermal BID  . diclofenac  1 patch Transdermal Once  . escitalopram  5 mg Oral QPM  . hydroxychloroquine  200 mg Oral BID  . ipratropium-albuterol  3 mL Nebulization TID  . isosorbide mononitrate  30 mg Oral q morning - 10a  . lactose free nutrition  237 mL Oral Q24H  . lisinopril  10 mg Oral Daily  . methocarbamol  500 mg Oral TID  . multivitamin with minerals  1 tablet Oral Q1200  . omeprazole  20 mg Oral q morning - 10a  . polyethylene glycol  17 g Oral Daily  . senna  1 tablet Oral BID  . sodium chloride  3 mL Intravenous Q12H  . sulfaSALAzine  500 mg Oral TID  . vancomycin  1,000 mg Intravenous On Call    Continuous Infusions: . sodium chloride 10 mL/hr at 12/07/13  1010    Lloyd Huger MS RD LDN Clinical Dietitian Pager:[phone]

## 2013-12-09 NOTE — Procedures (Signed)
Thoracic T8 and T9 kyphoplasty No complication No blood loss. See complete dictation in [facility].

## 2013-12-09 NOTE — Progress Notes (Signed)
[name] DOB: 01-07-1931 DOA: 12/01/2013 PCP: Hoyle Sauer, MD  Brief narrative: 80 y/o ? h/o R sided chronic DTt's on Lifelong coumadin, CAd s/p PCI RCA 2004, COPD, Prediabetes base don A1c 08/2013, Htn, GERD, Diverticular disease, Rh Arthritis on plaquenil, Barrett's esophagus, PAD, Fe def anemia-Hip surgery 03/2012 with removal painful Hardware R hip, HLDwith statin inteolerance, Abdominal aortic aneurysm, prior L4-5 stenosis admitted 12/02/13 with  H/o fall and resulting back pain 3 weeks pta.  She does have some difficulty remembering when she fell off and on one side but thinks it was in her right. She was admitted and found to have significant abdominal pain potentially secondary to rectus abdominus bruising with a hematoma palpable in the right rectus abdominis IR was consulted for evaluation of her T9/T10 pain and potential vertebroplasty but patient has been able to lay on to stomach because of rectus abdominis pain.  eventually her abd pain was better with slow resolution of the abdominal pain and IR was re-consulted to see her 12/08/13  Past medical history-As per Problem list Chart reviewed as below- Reviewed  Consultants:  Interventional radiology  Procedures:  CT scan abdomen pelvis, CT angiogram abdomen pelvis  Antibiotics:  None   Subjective   Well "best sleep last night in long time" NPO for procedure NO cp sob diarrhea   Objective    Interim History: Reviewed  Telemetry: Sinus rhythm/sinus tach   Objective: Filed Vitals:   12/08/13 2025 12/08/13 2053 12/09/13 0617 12/09/13 0849  BP:  131/85 156/65   Pulse:  77 73   Temp:  98.7 F (37.1 C) 98.2 F (36.8 C)   TempSrc:  Oral Oral   Resp:  18 18   Height:      Weight:      SpO2: 96% 95% 96% 93%    Intake/Output Summary (Last 24 hours) at 12/09/13 1146 Last data filed at 12/09/13 0600  Gross per 24 hour  Intake 494.58 ml  Output      0 ml  Net 494.58 ml     Exam:  General: Alert frail pleasant Caucasian female.  AAAx4 Cardiovascular: S1-S2 no murmur rub or gallop Respiratory: Clinically clear no added sound Abdomen: less tender  Above 12th rib right side  Skin see above Neuro 5/5 power throughout, sensory grossly throughout normal  Data Reviewed: Basic Metabolic Panel:  Recent Labs Lab 12/07/13 0510 12/09/13 0443  NA 139 140  K 3.4* 3.7  CL 105 105  CO2 23 23  GLUCOSE 102* 104*  BUN 5* 6  CREATININE 0.46* 0.55  CALCIUM 8.8 8.7  PHOS 3.8  --    Liver Function Tests:  Recent Labs Lab 12/07/13 0510  ALBUMIN 2.8*   No results found for this basename: LIPASE, AMYLASE,  in the last 168 hours No results found for this basename: AMMONIA,  in the last 168 hours CBC:  Recent Labs Lab 12/03/13 0540 12/04/13 0345 12/07/13 0510 12/08/13 0432 12/09/13 0443  WBC 4.4 4.8 4.5 4.8 4.8  NEUTROABS  --   --  2.7  --   --   HGB 10.2* 10.1* 9.6* 10.0* 9.7*  HCT 30.7* 30.9* 28.4* 30.1* 29.2*  MCV 88.0 89.3 87.4 89.6 89.3  PLT 172 180 193 180 198   Cardiac Enzymes:  Recent Labs Lab 12/02/13 1555  TROPONINI <0.30   BNP: No components found with this basename: POCBNP,  CBG: No results found for this basename: GLUCAP,  in the last 168 hours  Recent  Results (from the past 240 hour(s))  URINE CULTURE     Status: None   Collection Time    12/01/13 10:23 PM      Result Value Ref Range Status   Specimen Description URINE, CLEAN CATCH   Final   Special Requests NONE   Final   Culture  Setup Time     Final   Value: 12/03/2013 01:27     Performed at [facility] Count     Final   Value: >=100,000 COLONIES/ML     Performed at [facility]   Culture     Final   Value: Multiple bacterial morphotypes present, none predominant. Suggest appropriate recollection if clinically indicated.     Performed at [facility]   Report Status 12/03/2013 FINAL   Final     Studies:              All  Imaging reviewed and is as per above notation   Scheduled Meds: . diclofenac  1 patch Transdermal BID  . escitalopram  5 mg Oral QPM  . hydroxychloroquine  200 mg Oral BID  . ipratropium-albuterol  3 mL Nebulization TID  . isosorbide mononitrate  30 mg Oral q morning - 10a  . lactose free nutrition  237 mL Oral Q24H  . lisinopril  10 mg Oral Daily  . methocarbamol  500 mg Oral TID  . multivitamin with minerals  1 tablet Oral Q1200  . omeprazole  20 mg Oral q morning - 10a  . polyethylene glycol  17 g Oral Daily  . senna  1 tablet Oral BID  . sodium chloride  3 mL Intravenous Q12H  . sulfaSALAzine  500 mg Oral TID  . vancomycin  1,000 mg Intravenous On Call   Continuous Infusions: . sodium chloride 10 mL/hr at 12/07/13 1010     Assessment/Plan:  1. Rectus abdominis hematoma-  Patient now transitioned from multiple medications as multiple intolerances to current regimen of Tylenol 650 every 6 when necessary, tramadol 50-100 every 6 when necessary.  Good effect c K pad, diclofenac patch.  D/c lidoderm patch.   2. T9/T-10 Fractures for Vertebroplasty-  Heparin and Coumadin to be re-started post-procedure as per IR recommnedation as to when it is safe to re-start--PT eval subsequently 3. Tremulousness-d/c lidoderm-monitor in am 4. R sided Chronic DVT's-INR today 1.49 with Vit K 2.5. X 1 dose on 12/08/13.  Continue heparin gtt-for  T9,T10 vertebroplasty on  10/20 so now [facility] d/c'd 5. CAD s/p PCI 2004-only on Coumadin monotherapy.  From last cardiology visit does not need to be on antiplatelet agent . Continue Imdur 30 mg every morning 6. Asymptomatic bacteriuria on admit-No need to Rx as no systemic signs/sym illness 7. COPD-former smoker quit 2002 -continue duo neb 3 times a day  8. Barrett's esophagus and history diverticulitis -outpatient followup-currently stable 9. Rheumatoid arthritis -continue Plaquenil 200 twice a day , sulfasalazine 500 3 times a day 10. Constipation-d/c Dulcolax  suppository,d/c Colace 100 twice a day, d/c Linzess 290 every morning.  Continua Senna/miralax- Good effect with Sorbitol-scale back to once daily and give on d/c home. 11. Reflux-continue omeprazole 20 every morning  12. Probable bipolar 1-continue Lexapro 5 mg every afternoon 13. Multiple orthopedic issues-hip surgery 03/2012, prior L4-5 stenosis-currently stable. management as above 14. Diabetes mellitus -not currently on controlling medications.    15. Hypertension-see #3    Code Status: Full Family Communication:  No family present Disposition Plan: inpt-pending Ir  procedure. Will need bridge back from Heparin to coumadin priro to d/c to hersister's ALF   Pleas Koch, MD  [facility] Pager [phone] 12/09/2013, 11:46 AM    LOS: 8 days

## 2013-12-10 ENCOUNTER — Telehealth: Payer: Self-pay | Admitting: Cardiology

## 2013-12-10 DIAGNOSIS — I82509 Chronic embolism and thrombosis of unspecified deep veins of unspecified lower extremity: Secondary | ICD-10-CM

## 2013-12-10 DIAGNOSIS — S22000A Wedge compression fracture of unspecified thoracic vertebra, initial encounter for closed fracture: Secondary | ICD-10-CM | POA: Diagnosis present

## 2013-12-10 DIAGNOSIS — S301XXA Contusion of abdominal wall, initial encounter: Secondary | ICD-10-CM | POA: Diagnosis present

## 2013-12-10 DIAGNOSIS — S301XXS Contusion of abdominal wall, sequela: Secondary | ICD-10-CM

## 2013-12-10 DIAGNOSIS — I82501 Chronic embolism and thrombosis of unspecified deep veins of right lower extremity: Secondary | ICD-10-CM

## 2013-12-10 DIAGNOSIS — R1031 Right lower quadrant pain: Secondary | ICD-10-CM

## 2013-12-10 DIAGNOSIS — E46 Unspecified protein-calorie malnutrition: Secondary | ICD-10-CM | POA: Diagnosis present

## 2013-12-10 DIAGNOSIS — IMO0001 Reserved for inherently not codable concepts without codable children: Secondary | ICD-10-CM | POA: Diagnosis present

## 2013-12-10 LAB — CBC
HCT: 29.3 % — ABNORMAL LOW (ref 36.0–46.0)
Hemoglobin: 9.6 g/dL — ABNORMAL LOW (ref 12.0–15.0)
MCH: 29.6 pg (ref 26.0–34.0)
MCHC: 32.8 g/dL (ref 30.0–36.0)
MCV: 90.4 fL (ref 78.0–100.0)
Platelets: 175 10*3/uL (ref 150–400)
RBC: 3.24 MIL/uL — ABNORMAL LOW (ref 3.87–5.11)
RDW: 15.5 % (ref 11.5–15.5)
WBC: 6.7 10*3/uL (ref 4.0–10.5)

## 2013-12-10 LAB — PROTIME-INR
INR: 1.31 (ref 0.00–1.49)
Prothrombin Time: 16.4 seconds — ABNORMAL HIGH (ref 11.6–15.2)

## 2013-12-10 LAB — HEPARIN LEVEL (UNFRACTIONATED): Heparin Unfractionated: <0.1 IU/mL — ABNORMAL LOW (ref 0.30–0.70)

## 2013-12-10 MED ORDER — ENOXAPARIN SODIUM 40 MG/0.4ML ~~LOC~~ SOLN
40.0000 mg | Freq: Every day | SUBCUTANEOUS | Status: DC
  Administered 2013-12-10: 40 mg via SUBCUTANEOUS
  Filled 2013-12-10: qty 0.4

## 2013-12-10 MED ORDER — DICLOFENAC EPOLAMINE 1.3 % TD PTCH: 1.0000 | MEDICATED_PATCH | Freq: Two times a day (BID) | TRANSDERMAL | Status: DC

## 2013-12-10 MED ORDER — IPRATROPIUM-ALBUTEROL 0.5-2.5 (3) MG/3ML IN SOLN: 3.0000 mL | Freq: Two times a day (BID) | RESPIRATORY_TRACT | Status: DC

## 2013-12-10 MED ORDER — WARFARIN - PHARMACIST DOSING INPATIENT: Freq: Every day | Status: DC

## 2013-12-10 MED ORDER — WARFARIN SODIUM 5 MG PO TABS
5.0000 mg | ORAL_TABLET | Freq: Once | ORAL | Status: DC
  Filled 2013-12-10: qty 1

## 2013-12-10 MED ORDER — BOOST PLUS PO LIQD: 237.0000 mL | Freq: Two times a day (BID) | ORAL | Status: DC | PRN

## 2013-12-10 NOTE — Progress Notes (Signed)
All d/c forms completed and  given to patient's family. RX given to pt. Verba;izes understanding to d/c instructions.

## 2013-12-10 NOTE — Discharge Summary (Addendum)
Physician Discharge Summary  [name] MRN:[id number] DOB: 05/28/1930 DOA: 12/01/2013  PCP: AVVA,RAVISANKAR R, MD  Admit date: 12/01/2013 Discharge date: 12/10/2013  Time spent: 30 minutes  Recommendations for Outpatient Follow-up:  1. Discharge home with HHPT and RN 2. Follow up with PCP in 1 week. Monitor INR  Discharge Diagnoses:  Principal Problem:   Thoracic compression fracture   Active Problems:   COPD (chronic obstructive pulmonary disease)   HTN (hypertension)   CAD (coronary artery disease), native coronary artery - PCI to RCA & Circumflex 2004   prediabetes   Abdominal pain   Rectus sheath hematoma   Protein-calorie malnutrition   DVT, recurrent, lower extremity, chronic   Discharge Condition: FAIR  Diet recommendation: low sodium  Filed Weights   12/07/13 0349 12/08/13 0438 12/10/13 0347  Weight: 52.7 kg (116 lb 2.9 oz) 52.3 kg (115 lb 4.8 oz) 48.7 kg (107 lb 5.8 oz)    History of present illness:  80 y/o Female with chronic LE DVT on  Lifelong coumadin, CAD s/p PCI RCA 2004, COPD, Prediabetes base don A1c 08/2013, Htn, GERD, Diverticular disease, Rh Arthritis on plaquenil, Barrett's esophagus, PAD, iron  def anemia-Hip surgery 03/2012 with removal painful Hardware R hip, HLD with statin inteolerance, Abdominal aortic aneurysm, prior L4-5 stenosis admitted 12/02/13 with H/o fall and resulting back pain 3 weeks prior to admission.patient had compression fracture of T9-T10 and since her pain was severe she was admitted to the hospital we plan on vertebroplasty. Admission she was found to have significant abdominal pain potentially secondary to rectus abdominus bruising with a hematoma palpable in the right rectus abdominis of the CAT scan was not remarkable. IR was consulted for evaluation of her T9/T10 pain and potential vertebroplasty but patient was not able to lay on to stomach because of rectus abdominis pain thus the hospital stay was prolonged.   Abdominal pain finally subsided and patient underwent vertebroplasty on 10/20 and tolerated well. Patient was placed on IV heparin in the meantime as her Coumadin was held.    Hospital Course:  T9-T10 vertebral fracture Status post vertebroplasty on 10/21. Procedure was planned on admission however she could lay flat on her abdomen due to severe abdominal muscle pain.  Patient denies further back pain. Seen by physical therapy and recommended home health which will be arranged prior to discharge. Patient has been prescribed prn tramadol by my partner.  . Added bowel regimen as well. Patient needs to use a walker at all times. Coumadin resumed.   Right rectus abdominal hematoma The patient had abdominal pain since admission. CT scan of the abdomen was unremarkable however clinically had high suspicion for hematoma. She required multiple different medications for pain control. Currently stable. She has been prescribed a full course of oral Dilaudid and tramadol by my partner.   Coronary artery disease Status post PCI in 2004. Patient is on Coumadin. Continue Imdur  COPD Asymptomatic. Quit smoking in 2002  Right lower extremity chronic DVT. INR of 1.3 today. Patient was placed on IV heparin drip while off Coumadin and plan for vertebroplasty. She will be discharged on oral Coumadin 5 mg daily. Given history of chronic DVT I do not think she needs bridging  for anticoagulation. Also family informed me that they would not be able to afford Lovenox to bridge for Coumadin.  I have informed her PCP office to have her INR checked in 2 days and adjust the dose. Patient follows with heart care in [place] for INR check   and likely will follow there. I have spoken about this with her PCP office and they will coordinate.  GERD Continue PPI  Type 2 diabetes mellitus Not on any medications.  Rheumatoid arthritis Continue plaquenil and sulfasalazine  Protein calorie malnutrition Seen by dietitian and  added supplements   Procedures:  T8-T9 kyphoplasty on 10/21  Consultations:  Intervention radiology  General surgery   Code status: full code   Family communication: Discussed with granddaughter on the phone   Discharge Exam: Filed Vitals:   12/10/13 1413  BP: 112/60  Pulse: 77  Temp: 98.5 F (36.9 C)  Resp: 20    General: Elderly thin built female in no acute distress HEENT: Moist oral mucosa Chest: Clear to auscultation bilaterally, no added sound CVS: Normal S1 and S2, no murmurs Abdomen: Soft, nondistended, nontender, bowel sounds present Extremities: Warm, no edema, no back tenderness CNS: Alert and oriented   Discharge Instructions You were cared for by a hospitalist during your hospital stay. If you have any questions about your discharge medications or the care you received while you were in the hospital after you are discharged, you can call the unit and asked to speak with the hospitalist on call if the hospitalist that took care of you is not available. Once you are discharged, your primary care physician will handle any further medical issues. Please note that NO REFILLS for any discharge medications will be authorized once you are discharged, as it is imperative that you return to your primary care physician (or establish a relationship with a primary care physician if you do not have one) for your aftercare needs so that they can reassess your need for medications and monitor your lab values.  Discharge Instructions   Diet - low sodium heart healthy    Complete by:  As directed      Increase activity slowly    Complete by:  As directed           Current Discharge Medication List    START taking these medications   Details  diclofenac (FLECTOR) 1.3 % PTCH Place 1 patch onto the skin 2 (two) times daily. Qty: 30 patch, Refills: 0         lactose free nutrition (BOOST PLUS) LIQD Take 237 mLs by mouth 2 (two) times daily between meals as needed (As  requested by pt or family). Qty: 90 Can, Refills: 0    lisinopril (PRINIVIL,ZESTRIL) 10 MG tablet Take 1 tablet (10 mg total) by mouth daily. Qty: 30 tablet, Refills: 0    polyethylene glycol (MIRALAX / GLYCOLAX) packet Take 17 g by mouth daily. Qty: 14 each, Refills: 0      CONTINUE these medications which have CHANGED   Details  traMADol (ULTRAM) 50 MG tablet Take 2 tablets (100 mg total) by mouth every 6 (six) hours as needed for moderate pain. Qty: 75 tablet, Refills: 0    warfarin (COUMADIN) 5 MG tablet Take 1 tablet (5 mg total) by mouth daily. Qty: 4 tablet, Refills: 0      CONTINUE these medications which have NOT CHANGED   Details  Calcium Carbonate-Vitamin D (CALTRATE 600+D) 600-400 MG-UNIT per tablet Take 1 tablet by mouth every evening.     Camphor-Menthol-Methyl Sal (SALONPAS) 1.2-5.7-6.3 % PTCH Place 1 patch onto the skin daily as needed (For pain.).    cholecalciferol (VITAMIN D) 1000 UNITS tablet Take 2,000 Units by mouth every morning.    docusate sodium (COLACE) 100 MG capsule Take 100 mg   by mouth 2 (two) times daily.     escitalopram (LEXAPRO) 5 MG tablet Take 5 mg by mouth every evening.     Flaxseed, Linseed, (FLAX SEED OIL PO) Take 1 tablet by mouth daily at 12 noon.     hydroxychloroquine (PLAQUENIL) 200 MG tablet Take 200 mg by mouth 2 (two) times daily.     isosorbide mononitrate (IMDUR) 30 MG 24 hr tablet Take 30 mg by mouth every morning.    Linaclotide (LINZESS) 145 MCG CAPS capsule Take 290 mcg by mouth every morning.     magnesium citrate SOLN Take 1 Bottle by mouth once.    methocarbamol (ROBAXIN) 500 MG tablet Take 1 tablet (500 mg total) by mouth 3 (three) times daily as needed (For muscle spasms.). Qty: 40 tablet, Refills: 0    Multiple Vitamin (MULTIVITAMIN WITH MINERALS) TABS tablet Take 1 tablet by mouth daily at 12 noon.    omeprazole (PRILOSEC OTC) 20 MG tablet Take 20 mg by mouth every morning.     promethazine (PHENERGAN) 12.5  MG tablet Take 12.5 mg by mouth every 6 (six) hours as needed for nausea (Patient takes with Tramadol).    sulfaSALAzine (AZULFIDINE) 500 MG tablet Take 500 mg by mouth 3 (three) times daily.     glucose monitoring kit (FREESTYLE) monitoring kit 1 each by Does not apply route 4 (four) times daily - after meals and at bedtime. 1 month Diabetic Testing Supplies for QAC-QHS accuchecks.Any brand OK Qty: 1 each, Refills: 1      STOP taking these medications     polyvinyl alcohol (LIQUIFILM TEARS) 1.4 % ophthalmic solution        Allergies  Allergen Reactions  . Codeine Nausea And Vomiting, Swelling and Rash  . Hydrocodone Nausea And Vomiting, Swelling and Rash    "mouth peeled"  . Morphine And Related Other (See Comments)    hallucinations  . Other Nausea And Vomiting    Most pain meds! other than tramadol. Tylenol, Toradol, and Robaxin are ok.    . Penicillins Hives  . Demerol [Meperidine] Nausea And Vomiting  . Propoxyphene Hives and Nausea And Vomiting  . Statins Other (See Comments)    INTOLERANT OF STATINS - messes up her legs and has muscle aches  . Tape Other (See Comments)    Needs paper tape - skin very fragile   Follow-up Information   Follow up with [facility], Occupational Therapy and aide)    Contact information:   [address] [place] [address] [phone]       Follow up with Tivis Ringer, MD.   Specialty:  Internal Medicine   Contact information:   [address] [place] [address] [phone]        The results of significant diagnostics from this hospitalization (including imaging, microbiology, ancillary and laboratory) are listed below for reference.    Significant Diagnostic Studies: Ct Angio Chest Pe W/cm &/or Wo Cm  12/02/2013   CLINICAL DATA:  Abdominal pain. Left upper rib cage pain. Initial encounter.  EXAM: CT ANGIOGRAPHY CHEST  CT ABDOMEN AND PELVIS WITH CONTRAST   TECHNIQUE: Multidetector CT imaging of the chest was performed using the standard protocol during bolus administration of intravenous contrast. Multiplanar CT image reconstructions and MIPs were obtained to evaluate the vascular anatomy. Multidetector CT imaging of the abdomen and pelvis was performed using the standard protocol during bolus administration of intravenous contrast.  CONTRAST:  182m OMNIPAQUE IOHEXOL 350  MG/ML SOLN  COMPARISON:  None.  FINDINGS: CTA CHEST FINDINGS  THORACIC INLET/BODY WALL:  15 mm thyroid nodule in the inferior right lobe. This is likely incidental based on size and patient age.  No acute findings.  MEDIASTINUM:  Normal heart size. No pericardial effusion. Diffuse atherosclerosis, including the coronary arteries. Status post coronary stenting. Negative for acute aortic finding (as permitted by non opacification of the mid thoracic aorta). Negative for pulmonary embolism. Moderate sliding-type hiatal hernia without obstruction.  LUNG WINDOWS:  Subpleural reticulation which is likely chronic. There is no edema, pneumonia, effusion, or pneumothorax. No suspicious pulmonary nodules.  OSSEOUS:  There is a T9 compression fracture with height loss greater than 75%. Although an acute fracture line is not visible, there is surrounding relatively low-density perispinal fat infiltration - likely edema or hemorrhage. No definite bowing of the fracture, enhancing soft tissue, or sclerosis suggestive of pathologic fracture. No subluxation.  Remote T7 compression fracture status post bone augmentation.  Remote right-sided rib fractures.  Right glenohumeral arthroplasty with anterior subluxation of humeral prosthesis, commonly encounter.  CT ABDOMEN and PELVIS FINDINGS  BODY WALL: Unremarkable.  ABDOMEN/PELVIS:  Liver: No focal abnormality.  Biliary: Cholecystectomy.  Pancreas: Unremarkable.  Spleen: Granulomatous changes. Heterogeneous enhancement of the inferior spleen resolves on delayed imaging.  No splenic mass.  Adrenals: Unremarkable.  Kidneys and ureters: No hydronephrosis or stone.  Bladder: Moderately distended.  Reproductive: Low pelvic floor. Status post hysterectomy. No adnexal mass.  Bowel: Moderate sliding-type hiatal hernia. No bowel obstruction. No pericecal inflammation.  Retroperitoneum: No mass or adenopathy.  Peritoneum: No ascites or pneumoperitoneum.  Vascular: Extensive atherosclerosis, status post aorto bi femoral bypass. The graft is patent. No acute vascular findings.  OSSEOUS: Left total hip arthroplasty. Remote proximal right femur ORIF. No adverse findings. Remote insufficiency fractures of the right sacral ala and inferior pubic ramus. No acute fracture identified. L4-5 laminotomy.  Review of the MIP images confirms the above findings.  IMPRESSION: 1. Recent T9 compression fracture with severe height loss but no bony retropulsion. 2. Negative for pulmonary embolism. 3. No acute intra-abdominal findings.   Electronically Signed   By: Jorje Guild M.D.   On: 12/02/2013 01:23   Mr Thoracic Spine Wo Contrast  12/05/2013   CLINICAL DATA:  Fall 3 weeks ago. T9 fracture. Subsequent encounter  EXAM: MRI THORACIC SPINE WITHOUT CONTRAST  TECHNIQUE: Multiplanar, multisequence MR imaging of the thoracic spine was performed. No intravenous contrast was administered.  COMPARISON:  CT of the chest 12/02/2013.  FINDINGS: Normal signal is present throughout the thoracic spinal cord to the conus medullaris which terminates at L1-2, within normal limits.  Vertebral augmentation is evident at T7. There is a new burst fracture scratch the there is a new compression fracture at T9 with diffuse marrow edema. There slight retropulsion of bone which effaces the ventral CSF at this level. A fracture is noted along the anterior inferior endplate of T8 as well. A hemangioma is present posteriorly within the T8 vertebral body. An inferior endplate fracture at T6 is chronic.  Facet hypertrophy and  exaggerated kyphosis lead to mild central and bilateral foraminal narrowing at T8-9, right greater than left.  T9-10: Retropulsed bone and facet hypertrophy results in moderate right and mild left foraminal stenosis.  T10-11: A shallow left paramedian disc protrusion is present without significant stenosis.  Slight retrolisthesis and uncovering of a broad-based protrusion is present at L1-2. Mild subarticular stenosis is evident.  IMPRESSION: 1. New osteoporotic compression fracture at T9  with slight retropulsion of bone resulting and mild central canal stenosis. There is diffuse edema throughout the vertebral body. 2. Acute/subacute anterior inferior endplate compression fracture at T8. 3. Mild central and bilateral foraminal narrowing at T8-9. 4. Moderate right and mild left foraminal stenosis at T9-10. 5. Shallow left paramedian disc protrusion at T10-11 without significant stenosis. 6. Uncovering broad-based disc protrusion with mild subarticular narrowing bilaterally at L1-2.   Electronically Signed   By: Chris  Mattern M.D.   On: 12/05/2013 15:45   Ct Abdomen Pelvis W Contrast  12/02/2013   CLINICAL DATA:  Abdominal pain. Left upper rib cage pain. Initial encounter.  EXAM: CT ANGIOGRAPHY CHEST  CT ABDOMEN AND PELVIS WITH CONTRAST  TECHNIQUE: Multidetector CT imaging of the chest was performed using the standard protocol during bolus administration of intravenous contrast. Multiplanar CT image reconstructions and MIPs were obtained to evaluate the vascular anatomy. Multidetector CT imaging of the abdomen and pelvis was performed using the standard protocol during bolus administration of intravenous contrast.  CONTRAST:  100mL OMNIPAQUE IOHEXOL 350 MG/ML SOLN  COMPARISON:  None.  FINDINGS: CTA CHEST FINDINGS  THORACIC INLET/BODY WALL:  15 mm thyroid nodule in the inferior right lobe. This is likely incidental based on size and patient age.  No acute findings.  MEDIASTINUM:  Normal heart size. No pericardial  effusion. Diffuse atherosclerosis, including the coronary arteries. Status post coronary stenting. Negative for acute aortic finding (as permitted by non opacification of the mid thoracic aorta). Negative for pulmonary embolism. Moderate sliding-type hiatal hernia without obstruction.  LUNG WINDOWS:  Subpleural reticulation which is likely chronic. There is no edema, pneumonia, effusion, or pneumothorax. No suspicious pulmonary nodules.  OSSEOUS:  There is a T9 compression fracture with height loss greater than 75%. Although an acute fracture line is not visible, there is surrounding relatively low-density perispinal fat infiltration - likely edema or hemorrhage. No definite bowing of the fracture, enhancing soft tissue, or sclerosis suggestive of pathologic fracture. No subluxation.  Remote T7 compression fracture status post bone augmentation.  Remote right-sided rib fractures.  Right glenohumeral arthroplasty with anterior subluxation of humeral prosthesis, commonly encounter.  CT ABDOMEN and PELVIS FINDINGS  BODY WALL: Unremarkable.  ABDOMEN/PELVIS:  Liver: No focal abnormality.  Biliary: Cholecystectomy.  Pancreas: Unremarkable.  Spleen: Granulomatous changes. Heterogeneous enhancement of the inferior spleen resolves on delayed imaging. No splenic mass.  Adrenals: Unremarkable.  Kidneys and ureters: No hydronephrosis or stone.  Bladder: Moderately distended.  Reproductive: Low pelvic floor. Status post hysterectomy. No adnexal mass.  Bowel: Moderate sliding-type hiatal hernia. No bowel obstruction. No pericecal inflammation.  Retroperitoneum: No mass or adenopathy.  Peritoneum: No ascites or pneumoperitoneum.  Vascular: Extensive atherosclerosis, status post aorto bi femoral bypass. The graft is patent. No acute vascular findings.  OSSEOUS: Left total hip arthroplasty. Remote proximal right femur ORIF. No adverse findings. Remote insufficiency fractures of the right sacral ala and inferior pubic ramus. No acute  fracture identified. L4-5 laminotomy.  Review of the MIP images confirms the above findings.  IMPRESSION: 1. Recent T9 compression fracture with severe height loss but no bony retropulsion. 2. Negative for pulmonary embolism. 3. No acute intra-abdominal findings.   Electronically Signed   By: Jonathan  Watts M.D.   On: 12/02/2013 01:23   Ir Kypho Vertebral Thoracic Augmentation  12/10/2013   CLINICAL DATA:  Severe mid thoracic pain post fall. CT and MR demonstrate subacute unhealed compression fracture deformities, mild at T8, moderately severe T9. Remote kyphoplasty at T7.  EXAM: KYPHOPLASTY   AT THORACIC T8 AND T9:  TECHNIQUE: The procedure, risks (including but not limited to bleeding, infection, organ damage), benefits, and alternatives were explained to the patient. Questions regarding the procedure were encouraged and answered. The patient understands and consents to the procedure.  The patient was placed prone on the fluoroscopic table. The skin overlying the mid thoracic region was then prepped and draped in the usual sterile fashion. Maximal barrier sterile technique was utilized including caps, mask, sterile gowns, sterile gloves, sterile drape, hand hygiene and skin antiseptic.  Intravenous Fentanyl and Versed were administered as conscious sedation during continuous cardiorespiratory monitoring by the radiology RN, with a total moderate sedation time of75 minutes.  As antibiotic prophylaxis, vancomycin 1 g was ordered pre-procedure and administered intravenously within !one hour! of incision. The appropriate levels were localized under fluoroscopy.  The left pedicle at T9 was then infiltrated with 1% lidocaine followed by the advancement of a Kyphon trocar needle through the left pedicle into the posterior one-third. The trocar was removed and the osteo drill was advanced to the anterior third of the vertebral body. The osteo drill was retracted. Through the working cannula, a Kyphon inflatable bone  tamp 15 x 3 was advanced and positioned with the distal marker 5 mm from the anterior aspect of the cortex. Crossing of the midline was not seen on the AP projection. At this time, the balloon was expanded using contrast via a Kyphon inflation syringe device via micro tubing.  In similar fashion, the right pedicle was infiltrated with 1% lidocaine followed by the advancement of a second Kyphon trocar needle through the right pedicle into the posterior third of the vertebral body. The osteo drill was coaxially advanced to the anterior right third. The osteo drill was exchanged for a Kyphon inflatable bone tamp 15x 3, advanced to the 5 mm of the anterior aspect of the cortex. The balloon was then expanded using contrast as above.  In similar fashion, the left pedicle at T8 was then infiltrated with 1% lidocaine followed by the advancement of a Kyphon trocar needle through the left pedicle into the posterior one-third. The trocar was removed and the osteo drill was advanced to the anterior third of the vertebral body. The osteo drill was retracted. Through the working cannula, a Kyphon inflatable bone tamp 15 x 3 was advanced and positioned with the distal marker 5 mm from the anterior aspect of the cortex. Crossing of the midline was not seen on the AP projection. At this time, the balloon was expanded using contrast via a Kyphon inflation syringe device via micro tubing.  In similar fashion, the right T8 pedicle was infiltrated with 1% lidocaine followed by the advancement of a second Kyphon trocar needle through the right pedicle into the posterior third of the vertebral body. The osteo drill was coaxially advanced to the anterior right third. The osteo drill was exchanged for a Kyphon inflatable bone tamp 15x 3, advanced to the 5 mm of the anterior aspect of the cortex. The balloon was then expanded using contrast as above.  Inflations were continued until there was near apposition across the midline and with the  superior and the inferior end plates.  At this time, methylmethacrylate mixture was reconstituted in the Kyphon bone mixing device system. This was then loaded into the delivery mechanism, attached to Kyphon bone fillers.  The balloons were deflated and removed followed by the instillation of methylmethacrylate mixture with excellent filling in the AP and lateral projections. No extravasation was  noted in the disk spaces or posteriorly into the spinal canal. No significant epidural venous contamination was seen.  The patient tolerated the procedure well. There were no acute complications. The working cannulae and the bone filler were then retrieved and removed.  IMPRESSION: 1. Status post vertebral body augmentation using balloon kyphoplasty at thoracic T8 and T9 as described without event. 2. Per CMS PQRS reporting requirements (PQRS Measure 24): Given the patient's age of greater than 32 and the fracture site (hip, distal radius, or spine), the patient should be tested for osteoporosis using DXA, and the appropriate treatment considered based on the DXA results.   Electronically Signed   By: Arne Cleveland M.D.   On: 12/10/2013 09:46   Ir Kypho Vertebral Add'l Augmentation  12/10/2013   CLINICAL DATA:  Severe mid thoracic pain post fall. CT and MR demonstrate subacute unhealed compression fracture deformities, mild at T8, moderately severe T9. Remote kyphoplasty at T7.  EXAM: KYPHOPLASTY AT THORACIC T8 AND T9:  TECHNIQUE: The procedure, risks (including but not limited to bleeding, infection, organ damage), benefits, and alternatives were explained to the patient. Questions regarding the procedure were encouraged and answered. The patient understands and consents to the procedure.  The patient was placed prone on the fluoroscopic table. The skin overlying the mid thoracic region was then prepped and draped in the usual sterile fashion. Maximal barrier sterile technique was utilized including caps, mask,  sterile gowns, sterile gloves, sterile drape, hand hygiene and skin antiseptic.  Intravenous Fentanyl and Versed were administered as conscious sedation during continuous cardiorespiratory monitoring by the radiology RN, with a total moderate sedation time of75 minutes.  As antibiotic prophylaxis, vancomycin 1 g was ordered pre-procedure and administered intravenously within !one hour! of incision. The appropriate levels were localized under fluoroscopy.  The left pedicle at T9 was then infiltrated with 1% lidocaine followed by the advancement of a Kyphon trocar needle through the left pedicle into the posterior one-third. The trocar was removed and the osteo drill was advanced to the anterior third of the vertebral body. The osteo drill was retracted. Through the working cannula, a Kyphon inflatable bone tamp 15 x 3 was advanced and positioned with the distal marker 5 mm from the anterior aspect of the cortex. Crossing of the midline was not seen on the AP projection. At this time, the balloon was expanded using contrast via a Kyphon inflation syringe device via micro tubing.  In similar fashion, the right pedicle was infiltrated with 1% lidocaine followed by the advancement of a second Kyphon trocar needle through the right pedicle into the posterior third of the vertebral body. The osteo drill was coaxially advanced to the anterior right third. The osteo drill was exchanged for a Kyphon inflatable bone tamp 15x 3, advanced to the 5 mm of the anterior aspect of the cortex. The balloon was then expanded using contrast as above.  In similar fashion, the left pedicle at T8 was then infiltrated with 1% lidocaine followed by the advancement of a Kyphon trocar needle through the left pedicle into the posterior one-third. The trocar was removed and the osteo drill was advanced to the anterior third of the vertebral body. The osteo drill was retracted. Through the working cannula, a Kyphon inflatable bone tamp 15 x 3 was  advanced and positioned with the distal marker 5 mm from the anterior aspect of the cortex. Crossing of the midline was not seen on the AP projection. At this time, the balloon was expanded using  contrast via a Kyphon inflation syringe device via micro tubing.  In similar fashion, the right T8 pedicle was infiltrated with 1% lidocaine followed by the advancement of a second Kyphon trocar needle through the right pedicle into the posterior third of the vertebral body. The osteo drill was coaxially advanced to the anterior right third. The osteo drill was exchanged for a Kyphon inflatable bone tamp 15x 3, advanced to the 5 mm of the anterior aspect of the cortex. The balloon was then expanded using contrast as above.  Inflations were continued until there was near apposition across the midline and with the superior and the inferior end plates.  At this time, methylmethacrylate mixture was reconstituted in the Kyphon bone mixing device system. This was then loaded into the delivery mechanism, attached to Kyphon bone fillers.  The balloons were deflated and removed followed by the instillation of methylmethacrylate mixture with excellent filling in the AP and lateral projections. No extravasation was noted in the disk spaces or posteriorly into the spinal canal. No significant epidural venous contamination was seen.  The patient tolerated the procedure well. There were no acute complications. The working cannulae and the bone filler were then retrieved and removed.  IMPRESSION: 1. Status post vertebral body augmentation using balloon kyphoplasty at thoracic T8 and T9 as described without event. 2. Per CMS PQRS reporting requirements (PQRS Measure 24): Given the patient's age of greater than 50 and the fracture site (hip, distal radius, or spine), the patient should be tested for osteoporosis using DXA, and the appropriate treatment considered based on the DXA results.   Electronically Signed   By: Daniel  Hassell M.D.    On: 12/10/2013 09:46    Microbiology: Recent Results (from the past 240 hour(s))  URINE CULTURE     Status: None   Collection Time    12/01/13 10:23 PM      Result Value Ref Range Status   Specimen Description URINE, CLEAN CATCH   Final   Special Requests NONE   Final   Culture  Setup Time     Final   Value: 12/03/2013 01:27     Performed at [facility]   Colony Count     Final   Value: >=100,000 COLONIES/ML     Performed at [facility]   Culture     Final   Value: Multiple bacterial morphotypes present, none predominant. Suggest appropriate recollection if clinically indicated.     Performed at [facility]   Report Status 12/03/2013 FINAL   Final     Labs: Basic Metabolic Panel:  Recent Labs Lab 12/07/13 0510 12/09/13 0443  NA 139 140  K 3.4* 3.7  CL 105 105  CO2 23 23  GLUCOSE 102* 104*  BUN 5* 6  CREATININE 0.46* 0.55  CALCIUM 8.8 8.7  PHOS 3.8  --    Liver Function Tests:  Recent Labs Lab 12/07/13 0510  ALBUMIN 2.8*   No results found for this basename: LIPASE, AMYLASE,  in the last 168 hours No results found for this basename: AMMONIA,  in the last 168 hours CBC:  Recent Labs Lab 12/04/13 0345 12/07/13 0510 12/08/13 0432 12/09/13 0443 12/10/13 0408  WBC 4.8 4.5 4.8 4.8 6.7  NEUTROABS  --  2.7  --   --   --   HGB 10.1* 9.6* 10.0* 9.7* 9.6*  HCT 30.9* 28.4* 30.1* 29.2* 29.3*  MCV 89.3 87.4 89.6 89.3 90.4  PLT 180 193 180 198 175     Cardiac Enzymes: No results found for this basename: CKTOTAL, CKMB, CKMBINDEX, TROPONINI,  in the last 168 hours BNP: BNP (last 3 results) No results found for this basename: PROBNP,  in the last 8760 hours CBG: No results found for this basename: GLUCAP,  in the last 168 hours     Signed:  Louellen Molder  [facility] 12/10/2013, 2:46 PM

## 2013-12-10 NOTE — Progress Notes (Addendum)
Subjective: Pt feeling good this am. Pain much improved.   Objective: Physical Exam: BP 123/43  Pulse 92  Temp(Src) 99.5 F (37.5 C) (Oral)  Resp 20  Ht 5\' 3"  (1.6 m)  Wt 107 lb 5.8 oz (48.7 kg)  BMI 19.02 kg/m2  SpO2 95% KP perc access sites all clean, dry, no hematoma Minimal tenderness.   Labs: CBC  Recent Labs  12/09/13 0443 12/10/13 0408  WBC 4.8 6.7  HGB 9.7* 9.6*  HCT 29.2* 29.3*  PLT 198 175   BMET  Recent Labs  12/09/13 0443  NA 140  K 3.7  CL 105  CO2 23  GLUCOSE 104*  BUN 6  CREATININE 0.55  CALCIUM 8.7   LFT No results found for this basename: PROT, ALBUMIN, AST, ALT, ALKPHOS, BILITOT, BILIDIR, IBILI, LIPASE,  in the last 72 hours PT/INR  Recent Labs  12/09/13 0443 12/10/13 0408  LABPROT 15.6* 16.4*  INR 1.23 1.31     Studies/Results: No results found.  Assessment/Plan: S/p successful T8 and T9 kyphoplasty Symptomatic improvement this am. Can begin activity, PT. Needs to use RW at all times. OK to resume anticoagulation    LOS: 9 days    12/12/13 PA-C 12/10/2013 9:09 AM

## 2013-12-10 NOTE — Progress Notes (Signed)
Physical Therapy Treatment Patient Details Name: [name] MRN: [id number] DOB: 1930-12-02 Today's Date: 12/10/2013    History of Present Illness 80 y/o Fadmitted 12/01/13 with PMhx of angina, iron deficiency, HTN, HLD, COPD, DVT on coumadin, GERD, stroke, RA, OA, DJD presented to [facility] ED with main concern of several days duration of persistent abd discomfort, associated with cramping, poor oral intake. [name] Recent T9 compression fracture, Kyphoplasty performed on 12/09/2013.[name]    PT Comments    Pt doing much better, reports no pain in her back, more so in her right rib area under her R axilla area. Pt expects to DC home soon, and recommend and pt agrees to continue PT with HHPT to improve activity level and with hopes to transition back to her home at ModI level.    Follow Up Recommendations  Home health PT;Supervision/Assistance - 24 hour     Equipment Recommendations  None recommended by PT    Recommendations for Other Services       Precautions / Restrictions Precautions Precautions: Fall;Back Restrictions Weight Bearing Restrictions: No    Mobility  Bed Mobility Overal bed mobility: Needs Assistance Bed Mobility: Rolling;Sidelying to Sit;Sit to Supine Rolling: Supervision Sidelying to sit: Supervision Supine to sit: Supervision Sit to supine: Supervision      Transfers Overall transfer level: Needs assistance Equipment used: Rolling walker (2 wheeled) Transfers: Sit to/from Stand Sit to Stand: Supervision Stand pivot transfers: Supervision          Ambulation/Gait Ambulation/Gait assistance: Supervision Ambulation Distance (Feet): 180 Feet Assistive device: Rolling walker (2 wheeled) Gait Pattern/deviations: Step-through pattern     General Gait Details: seemed to tire a little at end of walk and slight shortness of breath, towards end also walker seemed to steer off to sides more.    Stairs            Wheelchair Mobility    Modified Rankin  (Stroke Patients Only)       Balance                                    Cognition Arousal/Alertness: Awake/alert Behavior During Therapy: WFL for tasks assessed/performed Overall Cognitive Status: Within Functional Limits for tasks assessed                      Exercises      General Comments        Pertinent Vitals/Pain Pain Score: 2  Pain Location: in r upper rib area under axillary area, denies painin her back Pain Descriptors / Indicators: Aching Pain Intervention(s): Monitored during session    Home Living Family/patient expects to be discharged to:: Private residence (pt states heading to sister's home who runs a at home nursing facility (one other pt there)) Living Arrangements: Other relatives (sister) Available Help at Discharge: Available 24 hours/day;Family Type of Home: House Home Access: Level entry   Home Layout: One level Home Equipment: Walker - 2 wheels;Cane - single point;Walker - 4 wheels Additional Comments: Lives in  "family group Home" setting run by pt's sister.    Prior Function Level of Independence: Independent with assistive device(s)      Comments: in house   PT Goals (current goals can now be found in the care plan section) Acute Rehab PT Goals Patient Stated Goal: to  go home soon  PT Goal Formulation: With patient Potential to Achieve Goals: Good Progress towards  PT goals: Progressing toward goals    Frequency  Min 3X/week    PT Plan Current plan remains appropriate    Co-evaluation             End of Session Equipment Utilized During Treatment: Gait belt Activity Tolerance: Patient tolerated treatment well (pt hoping to go home soon (to sisters). Activity limited today becasue she wanted to watch her show on TV) Patient left: in bed;with call bell/phone within reach;with bed alarm set     Time:  -     Charges:  $Gait Training: 8-22 mins                    G CodesMarella Bile 2013/12/13, 1:55 PM Marella Bile, PT Pager: [phone] 12/13/13

## 2013-12-10 NOTE — Telephone Encounter (Signed)
Cancel.

## 2013-12-10 NOTE — Progress Notes (Addendum)
ANTICOAGULATION CONSULT NOTE - Follow Up Consult  Pharmacy Consult Heparin Indication: history of DVT  Assessment: 41 yoF presented to ED on 10/12 with Hx fall and back/abdominal pain; found to have T9 compression Fx.  Also with hematoma in rectus abdominus.  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  PTA warfarin 3mg  daily, except 4.5mg  on Mon/Wed; therapeutic INR on admission.  Warfarin held; patient placed on heparin drip until undergoing kyphoplasty.  OK to resume anticoag today per IR.  Per [facility], due to remoteness of DVT (2 yrs ago), will not continue bridging with heparin, but did agree to start on prophylactic dose lovenox  Today, 10/21:  Plt wnl; Hgb slightly low but stable  INR non-therapeutic (off daily warfarin since admission)  Minimal bleeding from surgical site but OK from IR  Per nursing, abdominal hematoma still present, but slowly improving  Noted to be on sulfasalazine, diclofenac patch  Eating 100% of meals  Noted aPTT on 10/21 (before stopping heparin) to be 187s  Goal of Therapy:  INR 2-3   Plan:   Give warfarin 5 mg po x 1 tonight  Daily INR  CBC at least q72 hr while on warfarin  Continue to monitor H&H and platelets  Since last aPTT was so elevated, will check an anti-Xa level 4 hrs after 1st Lovenox dose today  -------------------------------------------------------  Warfarin doses/INR  10/12: INR 2.24  10/14: INR 1.9  10/16: INR 1.35, warfarin resumed; 5 mg given  10/17: INR 1.25, 4 mg given  10/18: INR 1.61, warfarin stopped; vit K 2.5 mg given  10/19: INR 1.49  10/20: INR 1.23  10/21: INR 1.31   Allergies  Allergen Reactions  . Codeine Nausea And Vomiting, Swelling and Rash  . Hydrocodone Nausea And Vomiting, Swelling and Rash    "mouth peeled"  . Morphine And Related Other (See Comments)    hallucinations  . Other Nausea And Vomiting    Most pain meds! other than tramadol.  Tylenol, Toradol, and Robaxin are ok.    . Penicillins Hives  . Demerol [Meperidine] Nausea And Vomiting  . Propoxyphene Hives and Nausea And Vomiting  . Statins Other (See Comments)    INTOLERANT OF STATINS - messes up her legs and has muscle aches  . Tape Other (See Comments)    Needs paper tape - skin very fragile    Patient Measurements: Height: 5\' 3"  (160 cm) Weight: 107 lb 5.8 oz (48.7 kg) IBW/kg (Calculated) : 52.4 Heparin dosing weight = 52kg  Vital Signs: Temp: 99.5 F (37.5 C) (10/21 0347) Temp Source: Oral (10/21 0347) BP: 123/43 mmHg (10/21 0347) Pulse Rate: 92 (10/21 0347)  Labs:  Recent Labs  12/08/13 0432 12/08/13 1709 12/09/13 0443 12/10/13 0408  HGB 10.0*  --  9.7* 9.6*  HCT 30.1*  --  29.2* 29.3*  PLT 180  --  198 175  APTT  --   --  187*  --   LABPROT 18.2*  --  15.6* 16.4*  INR 1.49  --  1.23 1.31  HEPARINUNFRC 0.55 0.57 0.56 <0.10*  CREATININE  --   --  0.55  --     Estimated Creatinine Clearance: 42.4 ml/min (by C-G formula based on Cr of 0.55).   Infusions:  . sodium chloride 10 mL/hr at 12/07/13 1010   12/12/13, PharmD Pager: [phone] 12/10/2013, 11:08 AM

## 2013-12-12 ENCOUNTER — Inpatient Hospital Stay (HOSPITAL_COMMUNITY)
Admission: EM | Admit: 2013-12-12 | Discharge: 2013-12-15 | DRG: 086 | Disposition: A | Discharge status: Home / Self Care | Payer: Medicare Other | Attending: Internal Medicine | Admitting: Internal Medicine

## 2013-12-12 ENCOUNTER — Emergency Department (HOSPITAL_COMMUNITY): Payer: Medicare Other

## 2013-12-12 ENCOUNTER — Encounter (HOSPITAL_COMMUNITY): Payer: Self-pay | Admitting: Emergency Medicine

## 2013-12-12 DIAGNOSIS — I739 Peripheral vascular disease, unspecified: Secondary | ICD-10-CM

## 2013-12-12 DIAGNOSIS — Z955 Presence of coronary angioplasty implant and graft: Secondary | ICD-10-CM | POA: Diagnosis not present

## 2013-12-12 DIAGNOSIS — S065X0A Traumatic subdural hemorrhage without loss of consciousness, initial encounter: Secondary | ICD-10-CM | POA: Diagnosis present

## 2013-12-12 DIAGNOSIS — Z8673 Personal history of transient ischemic attack (TIA), and cerebral infarction without residual deficits: Secondary | ICD-10-CM | POA: Diagnosis not present

## 2013-12-12 DIAGNOSIS — E876 Hypokalemia: Secondary | ICD-10-CM | POA: Diagnosis present

## 2013-12-12 DIAGNOSIS — Y92002 Bathroom of unspecified non-institutional (private) residence single-family (private) house as the place of occurrence of the external cause: Secondary | ICD-10-CM | POA: Diagnosis not present

## 2013-12-12 DIAGNOSIS — Z96642 Presence of left artificial hip joint: Secondary | ICD-10-CM | POA: Diagnosis present

## 2013-12-12 DIAGNOSIS — R296 Repeated falls: Secondary | ICD-10-CM | POA: Diagnosis present

## 2013-12-12 DIAGNOSIS — IMO0002 Reserved for concepts with insufficient information to code with codable children: Secondary | ICD-10-CM

## 2013-12-12 DIAGNOSIS — E785 Hyperlipidemia, unspecified: Secondary | ICD-10-CM | POA: Diagnosis present

## 2013-12-12 DIAGNOSIS — Z79899 Other long term (current) drug therapy: Secondary | ICD-10-CM | POA: Diagnosis not present

## 2013-12-12 DIAGNOSIS — J449 Chronic obstructive pulmonary disease, unspecified: Secondary | ICD-10-CM | POA: Diagnosis present

## 2013-12-12 DIAGNOSIS — S0181XA Laceration without foreign body of other part of head, initial encounter: Secondary | ICD-10-CM

## 2013-12-12 DIAGNOSIS — E1165 Type 2 diabetes mellitus with hyperglycemia: Secondary | ICD-10-CM | POA: Diagnosis present

## 2013-12-12 DIAGNOSIS — Z01810 Encounter for preprocedural cardiovascular examination: Secondary | ICD-10-CM

## 2013-12-12 DIAGNOSIS — Z96611 Presence of right artificial shoulder joint: Secondary | ICD-10-CM | POA: Diagnosis present

## 2013-12-12 DIAGNOSIS — S065X9A Traumatic subdural hemorrhage with loss of consciousness of unspecified duration, initial encounter: Secondary | ICD-10-CM | POA: Diagnosis present

## 2013-12-12 DIAGNOSIS — E118 Type 2 diabetes mellitus with unspecified complications: Secondary | ICD-10-CM

## 2013-12-12 DIAGNOSIS — I82509 Chronic embolism and thrombosis of unspecified deep veins of unspecified lower extremity: Secondary | ICD-10-CM

## 2013-12-12 DIAGNOSIS — I714 Abdominal aortic aneurysm, without rupture, unspecified: Secondary | ICD-10-CM

## 2013-12-12 DIAGNOSIS — I10 Essential (primary) hypertension: Secondary | ICD-10-CM | POA: Diagnosis present

## 2013-12-12 DIAGNOSIS — S0191XS Laceration without foreign body of unspecified part of head, sequela: Secondary | ICD-10-CM

## 2013-12-12 DIAGNOSIS — M069 Rheumatoid arthritis, unspecified: Secondary | ICD-10-CM | POA: Diagnosis present

## 2013-12-12 DIAGNOSIS — K219 Gastro-esophageal reflux disease without esophagitis: Secondary | ICD-10-CM | POA: Diagnosis present

## 2013-12-12 DIAGNOSIS — S065XAA Traumatic subdural hemorrhage with loss of consciousness status unknown, initial encounter: Secondary | ICD-10-CM

## 2013-12-12 DIAGNOSIS — IMO0001 Reserved for inherently not codable concepts without codable children: Secondary | ICD-10-CM

## 2013-12-12 DIAGNOSIS — I825Z9 Chronic embolism and thrombosis of unspecified deep veins of unspecified distal lower extremity: Secondary | ICD-10-CM | POA: Diagnosis present

## 2013-12-12 DIAGNOSIS — I2510 Atherosclerotic heart disease of native coronary artery without angina pectoris: Secondary | ICD-10-CM | POA: Diagnosis present

## 2013-12-12 DIAGNOSIS — E46 Unspecified protein-calorie malnutrition: Secondary | ICD-10-CM

## 2013-12-12 DIAGNOSIS — N390 Urinary tract infection, site not specified: Secondary | ICD-10-CM | POA: Diagnosis not present

## 2013-12-12 DIAGNOSIS — I6200 Nontraumatic subdural hemorrhage, unspecified: Secondary | ICD-10-CM

## 2013-12-12 DIAGNOSIS — W1812XA Fall from or off toilet with subsequent striking against object, initial encounter: Secondary | ICD-10-CM | POA: Diagnosis present

## 2013-12-12 DIAGNOSIS — M810 Age-related osteoporosis without current pathological fracture: Secondary | ICD-10-CM | POA: Diagnosis present

## 2013-12-12 DIAGNOSIS — S0990XA Unspecified injury of head, initial encounter: Secondary | ICD-10-CM | POA: Diagnosis present

## 2013-12-12 DIAGNOSIS — S0191XA Laceration without foreign body of unspecified part of head, initial encounter: Secondary | ICD-10-CM

## 2013-12-12 DIAGNOSIS — S51012A Laceration without foreign body of left elbow, initial encounter: Secondary | ICD-10-CM

## 2013-12-12 DIAGNOSIS — R490 Dysphonia: Secondary | ICD-10-CM

## 2013-12-12 DIAGNOSIS — S301XXA Contusion of abdominal wall, initial encounter: Secondary | ICD-10-CM | POA: Diagnosis present

## 2013-12-12 DIAGNOSIS — K2270 Barrett's esophagus without dysplasia: Secondary | ICD-10-CM

## 2013-12-12 DIAGNOSIS — Z87891 Personal history of nicotine dependence: Secondary | ICD-10-CM | POA: Diagnosis not present

## 2013-12-12 DIAGNOSIS — W19XXXA Unspecified fall, initial encounter: Secondary | ICD-10-CM

## 2013-12-12 DIAGNOSIS — F411 Generalized anxiety disorder: Secondary | ICD-10-CM

## 2013-12-12 DIAGNOSIS — W19XXXS Unspecified fall, sequela: Secondary | ICD-10-CM

## 2013-12-12 LAB — BASIC METABOLIC PANEL
Anion gap: 12 (ref 5–15)
BUN: 5 mg/dL — AB (ref 6–23)
CHLORIDE: 101 meq/L (ref 96–112)
CO2: 27 meq/L (ref 19–32)
CREATININE: 0.48 mg/dL — AB (ref 0.50–1.10)
Calcium: 9 mg/dL (ref 8.4–10.5)
GFR calc non Af Amer: 90 mL/min — ABNORMAL LOW (ref >90)
GLUCOSE: 125 mg/dL — AB (ref 70–99)
POTASSIUM: 3.4 meq/L — AB (ref 3.7–5.3)
Sodium: 140 mEq/L (ref 137–147)

## 2013-12-12 LAB — CBC WITH DIFFERENTIAL/PLATELET
Basophils Absolute: 0 10*3/uL (ref 0.0–0.1)
Basophils Relative: 0 % (ref 0–1)
EOS ABS: 0.4 10*3/uL (ref 0.0–0.7)
EOS PCT: 4 % (ref 0–5)
HEMATOCRIT: 31.5 % — AB (ref 36.0–46.0)
Hemoglobin: 10.4 g/dL — ABNORMAL LOW (ref 12.0–15.0)
LYMPHS ABS: 0.5 10*3/uL — AB (ref 0.7–4.0)
LYMPHS PCT: 6 % — AB (ref 12–46)
MCH: 29.8 pg (ref 26.0–34.0)
MCHC: 33 g/dL (ref 30.0–36.0)
MCV: 90.3 fL (ref 78.0–100.0)
MONO ABS: 0.7 10*3/uL (ref 0.1–1.0)
MONOS PCT: 8 % (ref 3–12)
Neutro Abs: 7.3 10*3/uL (ref 1.7–7.7)
Neutrophils Relative %: 82 % — ABNORMAL HIGH (ref 43–77)
PLATELETS: 186 10*3/uL (ref 150–400)
RBC: 3.49 MIL/uL — AB (ref 3.87–5.11)
RDW: 15.7 % — ABNORMAL HIGH (ref 11.5–15.5)
WBC: 9 10*3/uL (ref 4.0–10.5)

## 2013-12-12 LAB — TYPE AND SCREEN
ABO/RH(D): A POS
ANTIBODY SCREEN: NEGATIVE

## 2013-12-12 LAB — PROTIME-INR
INR: 1.24 (ref 0.00–1.49)
INR: 1.05 (ref 0.00–1.49)
INR: 1.04 (ref 0.00–1.49)
PROTHROMBIN TIME: 15.7 s — AB (ref 11.6–15.2)
PROTHROMBIN TIME: 13.8 s (ref 11.6–15.2)
Prothrombin Time: 13.8 seconds (ref 11.6–15.2)

## 2013-12-12 LAB — GLUCOSE, CAPILLARY: Glucose-Capillary: 108 mg/dL — ABNORMAL HIGH (ref 70–99)

## 2013-12-12 MED ORDER — LISINOPRIL 10 MG PO TABS
10.0000 mg | ORAL_TABLET | Freq: Every day | ORAL | Status: DC
  Administered 2013-12-13 – 2013-12-15 (×3): 10 mg via ORAL
  Filled 2013-12-12 (×4): qty 1

## 2013-12-12 MED ORDER — INSULIN ASPART 100 UNIT/ML ~~LOC~~ SOLN: 0.0000 [IU] | Freq: Three times a day (TID) | SUBCUTANEOUS | Status: DC

## 2013-12-12 MED ORDER — EMPTY CONTAINERS FLEXIBLE MISC
1500.0000 [IU] | Status: AC
  Administered 2013-12-12: 1500 [IU] via INTRAVENOUS
  Filled 2013-12-12: qty 60

## 2013-12-12 MED ORDER — HYDROXYCHLOROQUINE SULFATE 200 MG PO TABS
200.0000 mg | ORAL_TABLET | Freq: Two times a day (BID) | ORAL | Status: DC
  Administered 2013-12-13 – 2013-12-15 (×5): 200 mg via ORAL
  Filled 2013-12-12 (×7): qty 1

## 2013-12-12 MED ORDER — AMLODIPINE BESYLATE 5 MG PO TABS
5.0000 mg | ORAL_TABLET | Freq: Every day | ORAL | Status: DC
  Administered 2013-12-12 – 2013-12-15 (×4): 5 mg via ORAL
  Filled 2013-12-12 (×4): qty 1

## 2013-12-12 MED ORDER — HYDRALAZINE HCL 20 MG/ML IJ SOLN
10.0000 mg | Freq: Four times a day (QID) | INTRAMUSCULAR | Status: DC | PRN
  Administered 2013-12-12 – 2013-12-13 (×3): 10 mg via INTRAVENOUS
  Filled 2013-12-12 (×3): qty 1

## 2013-12-12 MED ORDER — ISOSORBIDE MONONITRATE ER 30 MG PO TB24
30.0000 mg | ORAL_TABLET | Freq: Every morning | ORAL | Status: DC
  Administered 2013-12-13 – 2013-12-15 (×3): 30 mg via ORAL
  Filled 2013-12-12 (×3): qty 1

## 2013-12-12 MED ORDER — ONDANSETRON HCL 4 MG/2ML IJ SOLN: 4.0000 mg | Freq: Four times a day (QID) | INTRAMUSCULAR | Status: DC | PRN

## 2013-12-12 MED ORDER — ADULT MULTIVITAMIN W/MINERALS CH
1.0000 | ORAL_TABLET | Freq: Every day | ORAL | Status: DC
  Administered 2013-12-13 – 2013-12-15 (×3): 1 via ORAL
  Filled 2013-12-12 (×3): qty 1

## 2013-12-12 MED ORDER — LINACLOTIDE 290 MCG PO CAPS
290.0000 ug | ORAL_CAPSULE | Freq: Every morning | ORAL | Status: DC
  Administered 2013-12-13 – 2013-12-15 (×3): 290 ug via ORAL
  Filled 2013-12-12 (×3): qty 1

## 2013-12-12 MED ORDER — VITAMIN K1 10 MG/ML IJ SOLN
10.0000 mg | INTRAMUSCULAR | Status: AC
  Administered 2013-12-12: 10 mg via INTRAVENOUS
  Filled 2013-12-12: qty 1

## 2013-12-12 MED ORDER — ACETAMINOPHEN 325 MG PO TABS: 650.0000 mg | ORAL_TABLET | Freq: Four times a day (QID) | ORAL | Status: DC | PRN

## 2013-12-12 MED ORDER — DOCUSATE SODIUM 100 MG PO CAPS
100.0000 mg | ORAL_CAPSULE | Freq: Two times a day (BID) | ORAL | Status: DC
  Administered 2013-12-13 – 2013-12-15 (×5): 100 mg via ORAL
  Filled 2013-12-12 (×6): qty 1

## 2013-12-12 MED ORDER — ESCITALOPRAM OXALATE 10 MG PO TABS
5.0000 mg | ORAL_TABLET | Freq: Every evening | ORAL | Status: DC
  Administered 2013-12-13 – 2013-12-14 (×2): 5 mg via ORAL
  Filled 2013-12-12 (×4): qty 1

## 2013-12-12 MED ORDER — OMEPRAZOLE 20 MG PO CPDR
20.0000 mg | DELAYED_RELEASE_CAPSULE | Freq: Every morning | ORAL | Status: DC
  Administered 2013-12-13 – 2013-12-15 (×3): 20 mg via ORAL
  Filled 2013-12-12 (×4): qty 1

## 2013-12-12 MED ORDER — INSULIN ASPART 100 UNIT/ML ~~LOC~~ SOLN: 3.0000 [IU] | Freq: Three times a day (TID) | SUBCUTANEOUS | Status: DC

## 2013-12-12 MED ORDER — SENNOSIDES-DOCUSATE SODIUM 8.6-50 MG PO TABS
1.0000 | ORAL_TABLET | Freq: Every evening | ORAL | Status: DC | PRN
  Filled 2013-12-12: qty 1

## 2013-12-12 MED ORDER — SODIUM CHLORIDE 0.9 % IV SOLN: Freq: Once | INTRAVENOUS | Status: DC

## 2013-12-12 MED ORDER — SODIUM CHLORIDE 0.9 % IJ SOLN
3.0000 mL | Freq: Two times a day (BID) | INTRAMUSCULAR | Status: DC
  Administered 2013-12-12 – 2013-12-15 (×6): 3 mL via INTRAVENOUS

## 2013-12-12 MED ORDER — ONDANSETRON HCL 4 MG PO TABS: 4.0000 mg | ORAL_TABLET | Freq: Four times a day (QID) | ORAL | Status: DC | PRN

## 2013-12-12 MED ORDER — SODIUM CHLORIDE 0.9 % IJ SOLN: 3.0000 mL | INTRAMUSCULAR | Status: DC | PRN

## 2013-12-12 MED ORDER — SULFASALAZINE 500 MG PO TABS
500.0000 mg | ORAL_TABLET | Freq: Three times a day (TID) | ORAL | Status: DC
  Administered 2013-12-13: 500 mg via ORAL
  Filled 2013-12-12 (×5): qty 1

## 2013-12-12 MED ORDER — ACETAMINOPHEN 650 MG RE SUPP: 650.0000 mg | Freq: Four times a day (QID) | RECTAL | Status: DC | PRN

## 2013-12-12 MED ORDER — SODIUM CHLORIDE 0.9 % IV SOLN
Freq: Once | INTRAVENOUS | Status: AC
  Administered 2013-12-12: 08:00:00 via INTRAVENOUS

## 2013-12-12 MED ORDER — PROTHROMBIN COMPLEX CONC HUMAN 500 UNITS IV KIT
25.0000 [IU]/kg | PACK | INTRAVENOUS | Status: DC
  Filled 2013-12-12: qty 54

## 2013-12-12 MED ORDER — CLONIDINE HCL 0.1 MG PO TABS
0.1000 mg | ORAL_TABLET | Freq: Two times a day (BID) | ORAL | Status: DC | PRN
  Administered 2013-12-13: 0.1 mg via ORAL
  Filled 2013-12-12 (×2): qty 1

## 2013-12-12 MED ORDER — SODIUM CHLORIDE 0.9 % IV SOLN: 250.0000 mL | INTRAVENOUS | Status: DC | PRN

## 2013-12-12 MED ORDER — TRAMADOL HCL 50 MG PO TABS
50.0000 mg | ORAL_TABLET | Freq: Four times a day (QID) | ORAL | Status: DC | PRN
  Administered 2013-12-13 – 2013-12-14 (×2): 50 mg via ORAL
  Filled 2013-12-12 (×2): qty 1

## 2013-12-12 MED ORDER — POLYETHYLENE GLYCOL 3350 17 G PO PACK
17.0000 g | PACK | Freq: Every day | ORAL | Status: DC
  Administered 2013-12-13: 17 g via ORAL
  Filled 2013-12-12 (×3): qty 1

## 2013-12-12 MED ORDER — LIDOCAINE-EPINEPHRINE (PF) 2 %-1:200000 IJ SOLN
10.0000 mL | Freq: Once | INTRAMUSCULAR | Status: AC
  Administered 2013-12-12: 10 mL
  Filled 2013-12-12: qty 20

## 2013-12-12 NOTE — ED Notes (Addendum)
Patient via [facility] after a fall from standing position. Patient states she hit her head on the wheel of her walker. Patient denies loss of consciousness. Patient states laceration to forehead, and skin tear to left elbow. Patient arrives with both wounds dressed and bleeding controlled. Patient take coumadin at home.

## 2013-12-12 NOTE — ED Notes (Signed)
Patients sister called, informed that patient has received a bed at [facility] and will be transferred there shortly.

## 2013-12-12 NOTE — H&P (Signed)
[facility]          History and Physical    PCP:   Tivis Ringer, MD   Consulting Neurosurgeon: Ashok Pall, MD  Chief Complaint:  Fall, left forehead laceration  HPI: Patient is a pleasant 81 year old woman with past medical history significant for hypertension, coronary artery disease, DM, COPD and a chronic recurrent lower extremity DVT for which she has been maintained on Coumadin. She had a recent hospitalization at [facility] and was discharged on October 21 during which she experienced a rectus sheath hematoma as well as had a vertebroplasty for a T9/T10 vertebral compression fracture. Her Coumadin was reinstated after discharge. Patient is examined with her sister with whom she has been living with since discharge. Patient states that this morning she went to her potty chair and fell off the potty chair hitting the left side of her head and arm. She has bruising surrounding her eye and a frontal laceration above her left eye that required stitches as well as multiple skin tears of her arm. A CT scan of the head showed a small acute left tentorial subdural hematoma without midline shift. The ED physician has contacted Dr. Cyndy Freeze, neurosurgery who has recommended rapid reversal of Coumadin with [facility] and transfer to [facility] for further management.  Allergies:   Allergies  Allergen Reactions  . Codeine Nausea And Vomiting, Swelling and Rash  . Hydrocodone Nausea And Vomiting, Swelling and Rash    "mouth peeled"  . Morphine And Related Other (See Comments)    hallucinations  . Other Nausea And Vomiting    Most pain meds! other than tramadol. Tylenol, Toradol, and Robaxin are ok.    . Penicillins Hives  . Demerol [Meperidine] Nausea And Vomiting  . Propoxyphene Hives and Nausea And Vomiting  . Statins Other (See Comments)    INTOLERANT OF STATINS - messes up her legs and has muscle aches  . Tape Other (See Comments)    Needs paper tape - skin  very fragile      Past Medical History  Diagnosis Date  . History of  Angina     Treated with PCI in 2004; no active Symptoms.  . Hiatal hernia   . GERD (gastroesophageal reflux disease)   . Iron deficiency anemia   . Barrett esophagus   . Osteoporosis   . HLD (hyperlipidemia)     Statin intolerant  . Internal hemorrhoids   . Diverticulosis   . Pain     RIGHT SHOULDER WITH LIMITED ROM--STATES PREVIOUS RT TOTAL SHOULDDER REPLACEMENT-BUT STILL HAS PROBLEMS WITH SHOULDER  . CAD S/P percutaneous coronary angioplasty 2004    PCI-RCA (2.39m x 169mTaxus DES)& Cx (2.75 mm x 14 mm Cypher DES)  . PAD (peripheral artery disease) 1994    History of right iliac stenting, fem-fem bypass in 94; aortobifem bypass/ AAA repair - November 2004; latest ABIs of been roughly 1 bilaterally, followed by Dr. EaDonnetta Hutching. HTN (hypertension)     sometimes  . Shortness of breath     MILD- WITH EXERTION. chronic  . Nocturia   . DVT (deep venous thrombosis)     BLE  . COPD (chronic obstructive pulmonary disease)   . Pneumonia     "several times"  . History of blood transfusion     "not sure why"  . History of stomach ulcers   . Migraine     "used to  have them; not anymore" (08/22/2013)  . Stroke     "showed up on XRAY" (08/22/2013)  . Osteoarthritis of both hips      status post multiple hip operations, recent fall with fracture.    [name] DJD (degenerative joint disease)   . Rheumatoid arthritis   . Fall from slip, trip, or stumble 08/20/2013  . Closed hip fracture     Right  . Edema   . Hyperlipidemia   . Neuropathy     Past Surgical History  Procedure Laterality Date  . Back surgery      x 2  . Appendectomy  1950  . Total shoulder replacement Right 2011  . Cholecystectomy      several yrs ago  . Cataract extraction w/ intraocular lens  implant, bilateral Bilateral 2001-2002  . Total abdominal hysterectomy  1965  . Upper gastrointestinal endoscopy  multiple    w/bx, hiatal hernia, Barretts',  esophageal polyp 1x  . Colonoscopy  05/09/2004; 06/08/1999    internal hemorrhoids; diverticulosis  . Shoulder open rotator cuff repair Bilateral     2 times on left  . Lumbar spine surgery  October 2012    spinal stenosis, Dr. Lorin Mercy  . Abdominal aortic aneurysm repair  2004    AortoBi-Fem Bypass  . Carpal tunnel release Left 04/2011  . Total hip arthroplasty  01/26/2012    Procedure: TOTAL HIP ARTHROPLASTY ANTERIOR APPROACH;  Surgeon: Mcarthur Rossetti, MD;  Location: [facility];  Service: Orthopedics;  Laterality: Left;  Left Total Hip Arthroplasty, Anterior Approach (C-Arm)  . Intramedullary (im) nail intertrochanteric Right 04/12/2012    Procedure: INTRAMEDULLARY (IM) NAIL INTERTROCHANTRIC Right;  Surgeon: Mcarthur Rossetti, MD;  Location: [place];  Service: Orthopedics;  Laterality: Right;  . Doppler echocardiography  06/06/2012    EF 50-60%.  Mild LVH, grade one diastolic function; normal PA pressures and CVP  . Lower extremity venous dopplers  08/16/2012    Partially occlusive right femoral vein thrombosis, also noted in right peroneal vein; no thrombosis in left sided veins.  . Nm myoview ltd  April 2014    Bowel artifact, No ischemia or infarction  . Femoral-femoral bypass graft  1994    Following right iliac stenting  . Doppler echocardiography  06/06/2012    Mild LVH, EF 55-60%.  Normal wall motion.  Mildly elevated LVEDP with grade one diastolic function.  Mild LA dilatation.  Normal PA pressures.  . Tubal ligation  1956  . Excision/release bursa hip Right 12/27/2012    Procedure: RIGHT HIP BURSECTOMY AND COMPRESSION SCREW REMOVAL;  Surgeon: Mcarthur Rossetti, MD;  Location: [facility];  Service: Orthopedics;  Laterality: Right;  . Joint replacement    . Coronary angioplasty with stent placement  2004    RCA: Taxus DES 2.5 mm x 12 mm; Circumflex: Cypher DES 2.75 mm 14 mm  . Cardiac catheterization  October 2005    Patent RCA stent, 20% ISR of Cx stent; EF 50-55%  . Lexiscan  cardiolite nuclear stress test      Prior to Admission medications   Medication Sig Start Date End Date Taking? Authorizing Provider  amLODipine (NORVASC) 5 MG tablet Take 5 mg by mouth daily.   Yes Historical Provider, MD  Calcium Carbonate-Vitamin D (CALTRATE 600+D) 600-400 MG-UNIT per tablet Take 1 tablet by mouth every evening.    Yes Historical Provider, MD  Camphor-Menthol-Methyl Sal (SALONPAS) 1.2-5.7-6.3 % PTCH Place 1 patch onto the skin daily as needed (For pain.).   Yes  Historical Provider, MD  cholecalciferol (VITAMIN D) 1000 UNITS tablet Take 2,000 Units by mouth every morning.   Yes Historical Provider, MD  cloNIDine (CATAPRES) 0.1 MG tablet Take 0.1 mg by mouth 2 (two) times daily as needed (if blood pressure is over 833 systolic).   Yes Historical Provider, MD  docusate sodium (COLACE) 100 MG capsule Take 100 mg by mouth 2 (two) times daily.    Yes Historical Provider, MD  escitalopram (LEXAPRO) 5 MG tablet Take 5 mg by mouth every evening.    Yes Historical Provider, MD  Flaxseed, Linseed, (FLAX SEED OIL PO) Take 1 tablet by mouth daily at 12 noon.    Yes Historical Provider, MD  hydroxychloroquine (PLAQUENIL) 200 MG tablet Take 200 mg by mouth 2 (two) times daily.    Yes Historical Provider, MD  isosorbide mononitrate (IMDUR) 30 MG 24 hr tablet Take 30 mg by mouth every morning.   Yes Historical Provider, MD  lactose free nutrition (BOOST PLUS) LIQD Take 237 mLs by mouth 2 (two) times daily between meals as needed (As requested by pt or family). 12/10/13  Yes Nishant Dhungel, MD  Linaclotide (LINZESS) 145 MCG CAPS capsule Take 290 mcg by mouth every morning.    Yes Historical Provider, MD  lisinopril (PRINIVIL,ZESTRIL) 10 MG tablet Take 1 tablet (10 mg total) by mouth daily. 12/06/13  Yes Nita Sells, MD  magnesium citrate SOLN Take 1 Bottle by mouth once.   Yes Historical Provider, MD  Multiple Vitamin (MULTIVITAMIN WITH MINERALS) TABS tablet Take 1 tablet by mouth daily  at 12 noon.   Yes Historical Provider, MD  omeprazole (PRILOSEC OTC) 20 MG tablet Take 20 mg by mouth every morning.    Yes Historical Provider, MD  polyethylene glycol (MIRALAX / GLYCOLAX) packet Take 17 g by mouth daily. 12/06/13  Yes Nita Sells, MD  promethazine (PHENERGAN) 12.5 MG tablet Take 12.5 mg by mouth every 6 (six) hours as needed for nausea (Patient takes with Tramadol).   Yes Historical Provider, MD  sulfaSALAzine (AZULFIDINE) 500 MG tablet Take 500 mg by mouth 3 (three) times daily.    Yes Historical Provider, MD  traMADol (ULTRAM) 50 MG tablet Take 2 tablets (100 mg total) by mouth every 6 (six) hours as needed for moderate pain. 12/06/13  Yes Nita Sells, MD  warfarin (COUMADIN) 5 MG tablet Take 1 tablet (5 mg total) by mouth daily. 12/06/13  Yes Nita Sells, MD    Social History:  reports that she quit smoking about 13 years ago. Her smoking use included Cigarettes. She has a 50 pack-year smoking history. She has never used smokeless tobacco. She reports that she does not drink alcohol or use illicit drugs.  Family History  Problem Relation Age of Onset  . Prostate cancer Father   . Diabetes Mother   . Colon cancer Neg Hx   . Breast cancer Mother   . Heart disease Mother     Review of Systems:  Constitutional: Denies fever, chills, diaphoresis, appetite change and fatigue.  HEENT: Denies photophobia, eye pain, redness, hearing loss, ear pain, congestion, sore throat, rhinorrhea, sneezing, mouth sores, trouble swallowing, neck pain, neck stiffness and tinnitus.   Respiratory: Denies SOB, DOE, cough, chest tightness,  and wheezing.   Cardiovascular: Denies chest pain, palpitations and leg swelling.  Gastrointestinal: Denies nausea, vomiting, abdominal pain, diarrhea, constipation, blood in stool and abdominal distention.  Genitourinary: Denies dysuria, urgency, frequency, hematuria, flank pain and difficulty urinating.  Endocrine: Denies: hot or  cold  intolerance, sweats, changes in hair or nails, polyuria, polydipsia. Musculoskeletal: Denies myalgias, joint swelling, arthralgias and gait problem.  Skin: Denies pallor, rash and wound.  Neurological: Denies dizziness, seizures, syncope, weakness, light-headedness, numbness and headaches.  Hematological: Denies adenopathy. Easy bruising, personal or family bleeding history  Psychiatric/Behavioral: Denies suicidal ideation, mood changes, confusion, nervousness, sleep disturbance and agitation   Physical Exam: Blood pressure 204/66, pulse 80, temperature 98.3 F (36.8 C), temperature source Oral, resp. rate 20, height _0  (1.575 m), weight 53.978 kg (119 lb), SpO2 97.00%. General: Alert, awake, oriented x3. HEENT: Normocephalic, bruising surrounding her left eye and cheek as well as an approximate 3 inch laceration on her forehead above her left eye that has already been stitched. Pupils equal round and reactive to light. Neck: Supple, no JVD, no lymphadenopathy, no bruits, no goiter. Exam cardiovascular: Regular rate and rhythm, no murmurs, rubs or gallops. Lungs: Clear to auscultation bilaterally. Abdomen: Soft, nontender, nondistended, positive bowel sounds Neurologic: Nonfocal  Labs on Admission:  Results for orders placed during the hospital encounter of 12/12/13 (from the past 48 hour(s))  CBC WITH DIFFERENTIAL     Status: Abnormal   Collection Time    12/12/13  7:38 AM      Result Value Ref Range   WBC 9.0  4.0 - 10.5 K/uL   RBC 3.49 (*) 3.87 - 5.11 MIL/uL   Hemoglobin 10.4 (*) 12.0 - 15.0 g/dL   HCT 31.5 (*) 36.0 - 46.0 %   MCV 90.3  78.0 - 100.0 fL   MCH 29.8  26.0 - 34.0 pg   MCHC 33.0  30.0 - 36.0 g/dL   RDW 15.7 (*) 11.5 - 15.5 %   Platelets 186  150 - 400 K/uL   Neutrophils Relative % 82 (*) 43 - 77 %   Neutro Abs 7.3  1.7 - 7.7 K/uL   Lymphocytes Relative 6 (*) 12 - 46 %   Lymphs Abs 0.5 (*) 0.7 - 4.0 K/uL   Monocytes Relative 8  3 - 12 %   Monocytes Absolute  0.7  0.1 - 1.0 K/uL   Eosinophils Relative 4  0 - 5 %   Eosinophils Absolute 0.4  0.0 - 0.7 K/uL   Basophils Relative 0  0 - 1 %   Basophils Absolute 0.0  0.0 - 0.1 K/uL  PROTIME-INR     Status: Abnormal   Collection Time    12/12/13  7:38 AM      Result Value Ref Range   Prothrombin Time 15.7 (*) 11.6 - 15.2 seconds   INR 1.24  0.00 - 7.67  BASIC METABOLIC PANEL     Status: Abnormal   Collection Time    12/12/13  7:38 AM      Result Value Ref Range   Sodium 140  137 - 147 mEq/L   Potassium 3.4 (*) 3.7 - 5.3 mEq/L   Chloride 101  96 - 112 mEq/L   CO2 27  19 - 32 mEq/L   Glucose, Bld 125 (*) 70 - 99 mg/dL   BUN 5 (*) 6 - 23 mg/dL   Creatinine, Ser 0.48 (*) 0.50 - 1.10 mg/dL   Calcium 9.0  8.4 - 10.5 mg/dL   GFR calc non Af Amer 90 (*) >90 mL/min   GFR calc Af Amer >90  >90 mL/min   Comment: (NOTE)     The eGFR has been calculated using the CKD EPI equation.     This calculation has not been validated  in all clinical situations.     eGFR's persistently <90 mL/min signify possible Chronic Kidney     Disease.   Anion gap 12  5 - 15  TYPE AND SCREEN     Status: None   Collection Time    12/12/13  7:38 AM      Result Value Ref Range   ABO/RH(D) A POS     Antibody Screen NEG     Sample Expiration 12/15/2013    PREPARE FRESH FROZEN PLASMA     Status: None   Collection Time    12/12/13  7:38 AM      Result Value Ref Range   Unit Number [id number]     Blood Component Type THAWED PLASMA     Unit division 00     Status of Unit ALLOCATED     Transfusion Status OK TO TRANSFUSE      Radiological Exams on Admission: Ct Head Wo Contrast  12/12/2013   CLINICAL DATA:  Head injury after fall in bedroom.  EXAM: CT HEAD WITHOUT CONTRAST  CT MAXILLOFACIAL WITHOUT CONTRAST  CT CERVICAL SPINE WITHOUT CONTRAST  TECHNIQUE: Multidetector CT imaging of the head, cervical spine, and maxillofacial structures were performed using the standard protocol without intravenous contrast. Multiplanar  CT image reconstructions of the cervical spine and maxillofacial structures were also generated.  COMPARISON:  CT scan of August 20, 2013.  FINDINGS: CT HEAD FINDINGS  Bony calvarium appears intact. Left frontal scalp laceration is noted. Mild diffuse cortical atrophy is noted. Mild chronic ischemic white matter disease is noted. 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 There is noted prominent increased density along the left tentorium which is more prominent compared to prior exams, and concerning for possible small subdural hematoma. No evidence of mass lesion or acute infarction is noted.  CT MAXILLOFACIAL FINDINGS  No fracture or other bony abnormality is noted. Paranasal sinuses appear normal. Pterygoid plates appear normal. Globes and orbits appear normal. Ostiomeatal complexes are widely patent.  CT CERVICAL SPINE FINDINGS  Visualized portions of upper lung fields appear normal. No definite fracture or significant spondylolisthesis is noted. Moderate degenerative disc disease is noted at C6-7 with anterior and posterior osteophyte formation hypertrophy of posterior facet joints is noted at C7-T1 secondary to degenerative joint disease.  IMPRESSION: Left frontal scalp laceration. Mild diffuse cortical atrophy. Mild chronic ischemic white matter disease.  Small acute left tentorial subdural hematoma.  No significant abnormality seen in the maxillofacial region.  Degenerative changes are again noted in the cervical spine. No fracture or significant spondylolisthesis is noted.  Critical Value/emergent results were called by telephone at the time of interpretation on 12/12/2013 at 7:23 am to Dr. Tanna Furry , who verbally acknowledged these results.   Electronically Signed   By: Sabino Dick M.D.   On: 12/12/2013 07:26   Ct Cervical Spine Wo Contrast  12/12/2013   CLINICAL DATA:  Head injury after fall in bedroom.  EXAM: CT HEAD WITHOUT CONTRAST  CT MAXILLOFACIAL WITHOUT CONTRAST  CT CERVICAL SPINE  WITHOUT CONTRAST  TECHNIQUE: Multidetector CT imaging of the head, cervical spine, and maxillofacial structures were performed using the standard protocol without intravenous contrast. Multiplanar CT image reconstructions of the cervical spine and maxillofacial structures were also generated.  COMPARISON:  CT scan of August 20, 2013.  FINDINGS: CT HEAD FINDINGS  Bony calvarium appears intact. Left frontal scalp laceration is noted. Mild diffuse cortical atrophy is noted. Mild chronic ischemic white matter disease is noted. Old  right posterior parietal infarction is again noted. There is noted prominent increased density along the left tentorium which is more prominent compared to prior exams, and concerning for possible small subdural hematoma. No evidence of mass lesion or acute infarction is noted.  CT MAXILLOFACIAL FINDINGS  No fracture or other bony abnormality is noted. Paranasal sinuses appear normal. Pterygoid plates appear normal. Globes and orbits appear normal. Ostiomeatal complexes are widely patent.  CT CERVICAL SPINE FINDINGS  Visualized portions of upper lung fields appear normal. No definite fracture or significant spondylolisthesis is noted. Moderate degenerative disc disease is noted at C6-7 with anterior and posterior osteophyte formation hypertrophy of posterior facet joints is noted at C7-T1 secondary to degenerative joint disease.  IMPRESSION: Left frontal scalp laceration. Mild diffuse cortical atrophy. Mild chronic ischemic white matter disease.  Small acute left tentorial subdural hematoma.  No significant abnormality seen in the maxillofacial region.  Degenerative changes are again noted in the cervical spine. No fracture or significant spondylolisthesis is noted.  Critical Value/emergent results were called by telephone at the time of interpretation on 12/12/2013 at 7:23 am to Dr. Tanna Furry , who verbally acknowledged these results.   Electronically Signed   By: Sabino Dick M.D.   On:  12/12/2013 07:26   Ct Maxillofacial Wo Cm  12/12/2013   CLINICAL DATA:  Head injury after fall in bedroom.  EXAM: CT HEAD WITHOUT CONTRAST  CT MAXILLOFACIAL WITHOUT CONTRAST  CT CERVICAL SPINE WITHOUT CONTRAST  TECHNIQUE: Multidetector CT imaging of the head, cervical spine, and maxillofacial structures were performed using the standard protocol without intravenous contrast. Multiplanar CT image reconstructions of the cervical spine and maxillofacial structures were also generated.  COMPARISON:  CT scan of August 20, 2013.  FINDINGS: CT HEAD FINDINGS  Bony calvarium appears intact. Left frontal scalp laceration is noted. Mild diffuse cortical atrophy is noted. Mild chronic ischemic white matter disease is noted. 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 There is noted prominent increased density along the left tentorium which is more prominent compared to prior exams, and concerning for possible small subdural hematoma. No evidence of mass lesion or acute infarction is noted.  CT MAXILLOFACIAL FINDINGS  No fracture or other bony abnormality is noted. Paranasal sinuses appear normal. Pterygoid plates appear normal. Globes and orbits appear normal. Ostiomeatal complexes are widely patent.  CT CERVICAL SPINE FINDINGS  Visualized portions of upper lung fields appear normal. No definite fracture or significant spondylolisthesis is noted. Moderate degenerative disc disease is noted at C6-7 with anterior and posterior osteophyte formation hypertrophy of posterior facet joints is noted at C7-T1 secondary to degenerative joint disease.  IMPRESSION: Left frontal scalp laceration. Mild diffuse cortical atrophy. Mild chronic ischemic white matter disease.  Small acute left tentorial subdural hematoma.  No significant abnormality seen in the maxillofacial region.  Degenerative changes are again noted in the cervical spine. No fracture or significant spondylolisthesis is noted.  Critical Value/emergent results were  called by telephone at the time of interpretation on 12/12/2013 at 7:23 am to Dr. Tanna Furry , who verbally acknowledged these results.   Electronically Signed   By: Sabino Dick M.D.   On: 12/12/2013 07:26    Assessment/Plan Principal Problem:   Subdural hematoma Active Problems:   HTN (hypertension)   DM (diabetes mellitus), type 2, uncontrolled with complications   Rectus sheath hematoma   DVT, recurrent, lower extremity, chronic   Laceration of head   SDH (subdural hematoma)    Subdural hematoma -ED  physician has discussed with neurosurgeon at [facility] and is recommending transfer and rapid reversal of Coumadin. -Will order repeat CT scan of the head in the morning.  Laceration of head -Has already been closed.  Frequent falls -Resulting in significant injury. -Check vitamin B12/TSH. -Will request PT/OT consultations. -Suspect we'll need skilled nursing facility.  Recurrent, chronic DVT of lower extremity -Hold off on Coumadin. -Will need to have discussion as to whether to continue Coumadin long-term at this point given her multiple falls with significant bleeding.  Diabetes -Check hemoglobin A1c. -Start sensitive sliding scale.  Hypertension -Blood pressure remains elevated. -Given current intracranial bleed, believe we should lower her blood pressure further. -She is on multiple antihypertensives at home which I will continue that include: Norvasc, clonidine, lisinopril, Imdur. -Will also place on when necessary hydralazine for systolic blood pressure greater than 170.  DVT prophylaxis -SCDs only given intracranial bleeding.  CODE STATUS -Full code as discussed with patient and sister at bedside.   Time Spent on Admission: 85 minutes  HERNANDEZ ACOSTA,ESTELA [facility] Pager: [phone] 12/12/2013, 9:04 AM

## 2013-12-12 NOTE — ED Provider Notes (Addendum)
CSN: [id number]     Arrival date & time 12/12/13  0545 History   First MD Initiated Contact with Patient 12/12/13 0557     Chief Complaint  Patient presents with  . Head Laceration      HPI  Patient presents after a fall at home. Patient was discharged on Wednesday, 2 days ago after admission for a compression fracture that required kyphoplasty, and rectus sheath hematoma. Is at home and is doing well. States the pain in her back resolved with her kyphoplasty. She reports no abdominal pain. She was getting from her bed to the bedside commode tonight. She states that she is attempting to sit on the commode. Apparently she only had part of her buttocks on the commode and slipped forward from it. She struck the left side of her head against her walker which was in front of her. Has a skin tear to her left elbow, and laceration to her forehead.  Past Medical History  Diagnosis Date  . History of  Angina     Treated with PCI in 2004; no active Symptoms.  . Hiatal hernia   . GERD (gastroesophageal reflux disease)   . Iron deficiency anemia   . Barrett esophagus   . Osteoporosis   . HLD (hyperlipidemia)     Statin intolerant  . Internal hemorrhoids   . Diverticulosis   . Pain     RIGHT SHOULDER WITH LIMITED ROM--STATES PREVIOUS RT TOTAL SHOULDDER REPLACEMENT-BUT STILL HAS PROBLEMS WITH SHOULDER  . CAD S/P percutaneous coronary angioplasty 2004    PCI-RCA (2.37m x 163mTaxus DES)& Cx (2.75 mm x 14 mm Cypher DES)  . PAD (peripheral artery disease) 1994    History of right iliac stenting, fem-fem bypass in 94; aortobifem bypass/ AAA repair - November 2004; latest ABIs of been roughly 1 bilaterally, followed by Dr. EaDonnetta Hutching. HTN (hypertension)     sometimes  . Shortness of breath     MILD- WITH EXERTION. chronic  . Nocturia   . DVT (deep venous thrombosis)     BLE  . COPD (chronic obstructive pulmonary disease)   . Pneumonia     "several times"  . History of blood transfusion     "not  sure why"  . History of stomach ulcers   . Migraine     "used to have them; not anymore" (08/22/2013)  . Stroke     "showed up on XRAY" (08/22/2013)  . Osteoarthritis of both hips      status post multiple hip operations, recent fall with fracture.    . Marland KitchenJD (degenerative joint disease)   . Rheumatoid arthritis   . Fall from slip, trip, or stumble 08/20/2013  . Closed hip fracture     Right  . Edema   . Hyperlipidemia   . Neuropathy    Past Surgical History  Procedure Laterality Date  . Back surgery      x 2  . Appendectomy  1950  . Total shoulder replacement Right 2011  . Cholecystectomy      several yrs ago  . Cataract extraction w/ intraocular lens  implant, bilateral Bilateral 2001-2002  . Total abdominal hysterectomy  1965  . Upper gastrointestinal endoscopy  multiple    w/bx, hiatal hernia, Barretts', esophageal polyp 1x  . Colonoscopy  05/09/2004; 06/08/1999    internal hemorrhoids; diverticulosis  . Shoulder open rotator cuff repair Bilateral     2 times on left  . Lumbar spine surgery  October 2012  spinal stenosis, Dr. Lorin Mercy  . Abdominal aortic aneurysm repair  2004    AortoBi-Fem Bypass  . Carpal tunnel release Left 04/2011  . Total hip arthroplasty  01/26/2012    Procedure: TOTAL HIP ARTHROPLASTY ANTERIOR APPROACH;  Surgeon: Mcarthur Rossetti, MD;  Location: [facility];  Service: Orthopedics;  Laterality: Left;  Left Total Hip Arthroplasty, Anterior Approach (C-Arm)  . Intramedullary (im) nail intertrochanteric Right 04/12/2012    Procedure: INTRAMEDULLARY (IM) NAIL INTERTROCHANTRIC Right;  Surgeon: Mcarthur Rossetti, MD;  Location: [facility];  Service: Orthopedics;  Laterality: Right;  . Doppler echocardiography  06/06/2012    EF 50-60%.  Mild LVH, grade one diastolic function; normal PA pressures and CVP  . Lower extremity venous dopplers  08/16/2012    Partially occlusive right femoral vein thrombosis, also noted in right peroneal vein; no thrombosis in left sided  veins.  . Nm myoview ltd  April 2014    Bowel artifact, No ischemia or infarction  . Femoral-femoral bypass graft  1994    Following right iliac stenting  . Doppler echocardiography  06/06/2012    Mild LVH, EF 55-60%.  Normal wall motion.  Mildly elevated LVEDP with grade one diastolic function.  Mild LA dilatation.  Normal PA pressures.  . Tubal ligation  1956  . Excision/release bursa hip Right 12/27/2012    Procedure: RIGHT HIP BURSECTOMY AND COMPRESSION SCREW REMOVAL;  Surgeon: Mcarthur Rossetti, MD;  Location: [facility];  Service: Orthopedics;  Laterality: Right;  . Joint replacement    . Coronary angioplasty with stent placement  2004    RCA: Taxus DES 2.5 mm x 12 mm; Circumflex: Cypher DES 2.75 mm 14 mm  . Cardiac catheterization  October 2005    Patent RCA stent, 20% ISR of Cx stent; EF 50-55%  . Lexiscan cardiolite nuclear stress test     Family History  Problem Relation Age of Onset  . Prostate cancer Father   . Diabetes Mother   . Colon cancer Neg Hx   . Breast cancer Mother   . Heart disease Mother    History  Substance Use Topics  . Smoking status: Former Smoker -- 1.00 packs/day for 50 years    Types: Cigarettes    Quit date: 04/20/2000  . Smokeless tobacco: Never Used  . Alcohol Use: No   OB History   Grav Para Term Preterm Abortions TAB SAB Ect Mult Living                 Review of Systems  Constitutional: Negative for fever, chills, diaphoresis, appetite change and fatigue.  HENT: Negative for mouth sores, sore throat and trouble swallowing.        Laceration to the forehead. Some bruising to the left face. No visual difficulties. No malocclusion or dental/oral trauma. No neck or back pain. No pain in the abdomen.  Eyes: Negative for visual disturbance.  Respiratory: Negative for cough, chest tightness, shortness of breath and wheezing.   Cardiovascular: Negative for chest pain.  Gastrointestinal: Negative for nausea, vomiting, abdominal pain, diarrhea  and abdominal distention.  Endocrine: Negative for polydipsia, polyphagia and polyuria.  Genitourinary: Negative for dysuria, frequency and hematuria.  Musculoskeletal: Negative for gait problem.  Skin: Negative for color change, pallor and rash.       Left forehead Laceration. Left elbow skin tear.  Neurological: Negative for dizziness, syncope, light-headedness and headaches.  Hematological: Does not bruise/bleed easily.  Psychiatric/Behavioral: Negative for behavioral problems and confusion.  Allergies  Codeine; Hydrocodone; Morphine and related; Other; Penicillins; Demerol; Propoxyphene; Statins; and Tape  Home Medications   Prior to Admission medications   Medication Sig Start Date End Date Taking? Authorizing Provider  Calcium Carbonate-Vitamin D (CALTRATE 600+D) 600-400 MG-UNIT per tablet Take 1 tablet by mouth every evening.    Yes Historical Provider, MD  Camphor-Menthol-Methyl Sal (SALONPAS) 1.2-5.7-6.3 % PT[facility] Place 1 patch onto the skin daily as needed (For pain.).   Yes Historical Provider, MD  cholecalciferol (VITAMIN D) 1000 UNITS tablet Take 2,000 Units by mouth every morning.   Yes Historical Provider, MD  diclofenac (FLECTOR) 1.3 % PT[facility] Place 1 patch onto the skin 2 (two) times daily. 12/06/13  Yes Nita Sells, MD  diclofenac (FLECTOR) 1.3 % PT[facility] Place 1 patch onto the skin 2 (two) times daily. 12/10/13  Yes Nishant Dhungel, MD  docusate sodium (COLACE) 100 MG capsule Take 100 mg by mouth 2 (two) times daily.    Yes Historical Provider, MD  escitalopram (LEXAPRO) 5 MG tablet Take 5 mg by mouth every evening.    Yes Historical Provider, MD  Flaxseed, Linseed, (FLAX SEED OIL PO) Take 1 tablet by mouth daily at 12 noon.    Yes Historical Provider, MD  glucose monitoring kit (FREESTYLE) monitoring kit 1 each by Does not apply route 4 (four) times daily - after meals and at bedtime. 1 month Diabetic Testing Supplies for QAC-QHS accuchecks.Any brand OK 08/23/13  Yes  Thurnell Lose, MD  hydroxychloroquine (PLAQUENIL) 200 MG tablet Take 200 mg by mouth 2 (two) times daily.    Yes Historical Provider, MD  isosorbide mononitrate (IMDUR) 30 MG 24 hr tablet Take 30 mg by mouth every morning.   Yes Historical Provider, MD  lactose free nutrition (BOOST PLUS) LIQD Take 237 mLs by mouth 2 (two) times daily between meals as needed (As requested by pt or family). 12/10/13  Yes Nishant Dhungel, MD  Linaclotide (LINZESS) 145 MCG CAPS capsule Take 290 mcg by mouth every morning.    Yes Historical Provider, MD  lisinopril (PRINIVIL,ZESTRIL) 10 MG tablet Take 1 tablet (10 mg total) by mouth daily. 12/06/13  Yes Nita Sells, MD  magnesium citrate SOLN Take 1 Bottle by mouth once.   Yes Historical Provider, MD  methocarbamol (ROBAXIN) 500 MG tablet Take 1 tablet (500 mg total) by mouth 3 (three) times daily as needed (For muscle spasms.). 01/01/13  Yes Erskine Emery, PA-C  Multiple Vitamin (MULTIVITAMIN WITH MINERALS) TABS tablet Take 1 tablet by mouth daily at 12 noon.   Yes Historical Provider, MD  omeprazole (PRILOSEC OTC) 20 MG tablet Take 20 mg by mouth every morning.    Yes Historical Provider, MD  polyethylene glycol (MIRALAX / GLYCOLAX) packet Take 17 g by mouth daily. 12/06/13  Yes Nita Sells, MD  promethazine (PHENERGAN) 12.5 MG tablet Take 12.5 mg by mouth every 6 (six) hours as needed for nausea (Patient takes with Tramadol).   Yes Historical Provider, MD  sulfaSALAzine (AZULFIDINE) 500 MG tablet Take 500 mg by mouth 3 (three) times daily.    Yes Historical Provider, MD  traMADol (ULTRAM) 50 MG tablet Take 2 tablets (100 mg total) by mouth every 6 (six) hours as needed for moderate pain. 12/06/13  Yes Nita Sells, MD  warfarin (COUMADIN) 5 MG tablet Take 1 tablet (5 mg total) by mouth daily. 12/06/13  Yes Nita Sells, MD   BP 178/83  Pulse 82  Temp(Src) 98.1 F (36.7 C) (Oral)  Resp 20  Ht  5' 2"  (1.575 m)  Wt 119 lb (53.978 kg)   BMI 21.76 kg/m2  SpO2 98% Physical Exam  Constitutional: She is oriented to person, place, and time. She appears well-developed and well-nourished. No distress.  HENT:  Head: Normocephalic.    Eyes: Conjunctivae are normal. Pupils are equal, round, and reactive to light. No scleral icterus.  Neck: Normal range of motion. Neck supple. No thyromegaly present.  Cardiovascular: Normal rate and regular rhythm.  Exam reveals no gallop and no friction rub.   No murmur heard. Pulmonary/Chest: Effort normal and breath sounds normal. No respiratory distress. She has no wheezes. She has no rales.  Abdominal: Soft. Bowel sounds are normal. She exhibits no distension. There is no tenderness. There is no rebound.  Musculoskeletal: Normal range of motion.  Neurological: She is alert and oriented to person, place, and time.  Skin: Skin is warm and dry. No rash noted.  Psychiatric: She has a normal mood and affect. Her behavior is normal.    ED Course  LACERATION REPAIR Date/Time: 12/12/2013 6:38 AM Performed by: Tanna Furry Authorized by: Tanna Furry Consent: Verbal consent obtained. written consent not obtained. Risks and benefits: risks, benefits and alternatives were discussed Consent given by: patient Patient understanding: patient states understanding of the procedure being performed Patient identity confirmed: verbally with patient Body area: head/neck Location details: forehead Laceration length: 4 cm Foreign bodies: no foreign bodies Tendon involvement: none Nerve involvement: none Vascular damage: no Local anesthetic: lidocaine 2% with epinephrine Anesthetic total: 6 ml Patient sedated: no Preparation: Patient was prepped and draped in the usual sterile fashion. Irrigation solution: saline Irrigation method: syringe Amount of cleaning: standard Debridement: none Degree of undermining: none Skin closure: 5-0 nylon Number of sutures: 7 Technique: running Approximation:  close Approximation difficulty: simple Dressing: 4x4 sterile gauze (Ace wrap) Patient tolerance: Patient tolerated the procedure well with no immediate complications.   (including critical care time) Labs Review Labs Reviewed  CBC WITH DIFFERENTIAL  PROTIME-INR  BASIC METABOLIC PANEL  TYPE AND SCREEN  PREPARE FRESH FROZEN PLASMA    Imaging Review Ct Head Wo Contrast  12/12/2013   CLINICAL DATA:  Head injury after fall in bedroom.  EXAM: CT HEAD WITHOUT CONTRAST  CT MAXILLOFACIAL WITHOUT CONTRAST  CT CERVICAL SPINE WITHOUT CONTRAST  TECHNIQUE: Multidetector CT imaging of the head, cervical spine, and maxillofacial structures were performed using the standard protocol without intravenous contrast. Multiplanar CT image reconstructions of the cervical spine and maxillofacial structures were also generated.  COMPARISON:  CT scan of August 20, 2013.  FINDINGS: CT HEAD FINDINGS  Bony calvarium appears intact. Left frontal scalp laceration is noted. Mild diffuse cortical atrophy is noted. Mild chronic ischemic white matter disease is noted. 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 There is noted prominent increased density along the left tentorium which is more prominent compared to prior exams, and concerning for possible small subdural hematoma. No evidence of mass lesion or acute infarction is noted.  CT MAXILLOFACIAL FINDINGS  No fracture or other bony abnormality is noted. Paranasal sinuses appear normal. Pterygoid plates appear normal. Globes and orbits appear normal. Ostiomeatal complexes are widely patent.  CT CERVICAL SPINE FINDINGS  Visualized portions of upper lung fields appear normal. No definite fracture or significant spondylolisthesis is noted. Moderate degenerative disc disease is noted at C6-7 with anterior and posterior osteophyte formation hypertrophy of posterior facet joints is noted at C7-T1 secondary to degenerative joint disease.  IMPRESSION: Left frontal scalp  laceration. Mild diffuse  cortical atrophy. Mild chronic ischemic white matter disease.  Small acute left tentorial subdural hematoma.  No significant abnormality seen in the maxillofacial region.  Degenerative changes are again noted in the cervical spine. No fracture or significant spondylolisthesis is noted.  Critical Value/emergent results were called by telephone at the time of interpretation on 12/12/2013 at 7:23 am to Dr. Tanna Furry , who verbally acknowledged these results.   Electronically Signed   By: Sabino Dick M.D.   On: 12/12/2013 07:26   Ct Cervical Spine Wo Contrast  12/12/2013   CLINICAL DATA:  Head injury after fall in bedroom.  EXAM: CT HEAD WITHOUT CONTRAST  CT MAXILLOFACIAL WITHOUT CONTRAST  CT CERVICAL SPINE WITHOUT CONTRAST  TECHNIQUE: Multidetector CT imaging of the head, cervical spine, and maxillofacial structures were performed using the standard protocol without intravenous contrast. Multiplanar CT image reconstructions of the cervical spine and maxillofacial structures were also generated.  COMPARISON:  CT scan of August 20, 2013.  FINDINGS: CT HEAD FINDINGS  Bony calvarium appears intact. Left frontal scalp laceration is noted. Mild diffuse cortical atrophy is noted. Mild chronic ischemic white matter disease is noted. 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 There is noted prominent increased density along the left tentorium which is more prominent compared to prior exams, and concerning for possible small subdural hematoma. No evidence of mass lesion or acute infarction is noted.  CT MAXILLOFACIAL FINDINGS  No fracture or other bony abnormality is noted. Paranasal sinuses appear normal. Pterygoid plates appear normal. Globes and orbits appear normal. Ostiomeatal complexes are widely patent.  CT CERVICAL SPINE FINDINGS  Visualized portions of upper lung fields appear normal. No definite fracture or significant spondylolisthesis is noted. Moderate degenerative disc  disease is noted at C6-7 with anterior and posterior osteophyte formation hypertrophy of posterior facet joints is noted at C7-T1 secondary to degenerative joint disease.  IMPRESSION: Left frontal scalp laceration. Mild diffuse cortical atrophy. Mild chronic ischemic white matter disease.  Small acute left tentorial subdural hematoma.  No significant abnormality seen in the maxillofacial region.  Degenerative changes are again noted in the cervical spine. No fracture or significant spondylolisthesis is noted.  Critical Value/emergent results were called by telephone at the time of interpretation on 12/12/2013 at 7:23 am to Dr. Tanna Furry , who verbally acknowledged these results.   Electronically Signed   By: Sabino Dick M.D.   On: 12/12/2013 07:26   Ct Maxillofacial Wo Cm  12/12/2013   CLINICAL DATA:  Head injury after fall in bedroom.  EXAM: CT HEAD WITHOUT CONTRAST  CT MAXILLOFACIAL WITHOUT CONTRAST  CT CERVICAL SPINE WITHOUT CONTRAST  TECHNIQUE: Multidetector CT imaging of the head, cervical spine, and maxillofacial structures were performed using the standard protocol without intravenous contrast. Multiplanar CT image reconstructions of the cervical spine and maxillofacial structures were also generated.  COMPARISON:  CT scan of August 20, 2013.  FINDINGS: CT HEAD FINDINGS  Bony calvarium appears intact. Left frontal scalp laceration is noted. Mild diffuse cortical atrophy is noted. Mild chronic ischemic white matter disease is noted. 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 There is noted prominent increased density along the left tentorium which is more prominent compared to prior exams, and concerning for possible small subdural hematoma. No evidence of mass lesion or acute infarction is noted.  CT MAXILLOFACIAL FINDINGS  No fracture or other bony abnormality is noted. Paranasal sinuses appear normal. Pterygoid plates appear normal. Globes and orbits appear normal. Ostiomeatal complexes are  widely  patent.  CT CERVICAL SPINE FINDINGS  Visualized portions of upper lung fields appear normal. No definite fracture or significant spondylolisthesis is noted. Moderate degenerative disc disease is noted at C6-7 with anterior and posterior osteophyte formation hypertrophy of posterior facet joints is noted at C7-T1 secondary to degenerative joint disease.  IMPRESSION: Left frontal scalp laceration. Mild diffuse cortical atrophy. Mild chronic ischemic white matter disease.  Small acute left tentorial subdural hematoma.  No significant abnormality seen in the maxillofacial region.  Degenerative changes are again noted in the cervical spine. No fracture or significant spondylolisthesis is noted.  Critical Value/emergent results were called by telephone at the time of interpretation on 12/12/2013 at 7:23 am to Dr. Tanna Furry , who verbally acknowledged these results.   Electronically Signed   By: Sabino Dick M.D.   On: 12/12/2013 07:26     EKG Interpretation None      MDM   Final diagnoses:  Fall  Forehead laceration, initial encounter  Skin tear of elbow without complication, left, initial encounter    Laceration repaired. Patient with normal mental status. Awake alert oriented lucid. Minimal complaints at this time. Elbow skin tear cleaned and dressed. Currently in CT.  06:53:  Sister arise. The patient is been staying with her sister. Sister confirms that the patient has otherwise been doing very well since discharge.. No new complaints voiced by the patient on reexamination.[name] Ace wrap removed from her laceration and reinspected. No bleeding at this time. Re-dressed. Awaiting CT results.  CT Head + small, acute lt tentorial SDH without mass effect, or midline shift. Type and screen and FFP ordered. CT Neck and facial no acute pathology.    Will discuss with NS, hospitalist.  07:46: Per [facility], Dr. Cyndy Freeze involved in emergent procedure with unstable patient, will call back when able.      Tanna Furry, MD 12/12/13 7022  Tanna Furry, MD 12/12/13 [place], MD 12/17/13 [phone]

## 2013-12-12 NOTE — ED Notes (Signed)
Dr Philip Aspen informed of FFP was ordered and canceled via Dr Fayrene Fearing couple times between [phone] and [phone].  Verbal order given to infuse FFP as first ordered.  FFP given as orderd.

## 2013-12-12 NOTE — ED Notes (Signed)
Dressing on forehead removed at this time. 4cm laceration noted to left forehead. Also a 1cm puncture/abrasion noted to eyebrow.   Dressing removed from left elbow at this time. 4cmX3cm skin tear noted to left elbow.  Wounds cleaned at this time.

## 2013-12-12 NOTE — ED Notes (Signed)
Report given to [facility]. Patient transported at this time to [facility]

## 2013-12-12 NOTE — ED Notes (Signed)
Pt. C/o bi-lateral rib pain. Dr. Fayrene Fearing notified.

## 2013-12-12 NOTE — ED Notes (Signed)
Spoke with Maggie on Dept 2C, at [facility], report given. All questions answered.

## 2013-12-13 ENCOUNTER — Inpatient Hospital Stay (HOSPITAL_COMMUNITY): Payer: Medicare Other

## 2013-12-13 DIAGNOSIS — Z86718 Personal history of other venous thrombosis and embolism: Secondary | ICD-10-CM

## 2013-12-13 DIAGNOSIS — S0181XA Laceration without foreign body of other part of head, initial encounter: Secondary | ICD-10-CM

## 2013-12-13 DIAGNOSIS — I10 Essential (primary) hypertension: Secondary | ICD-10-CM

## 2013-12-13 LAB — GLUCOSE, CAPILLARY
Glucose-Capillary: 62 mg/dL — ABNORMAL LOW (ref 70–99)
Glucose-Capillary: 146 mg/dL — ABNORMAL HIGH (ref 70–99)
Glucose-Capillary: 101 mg/dL — ABNORMAL HIGH (ref 70–99)
Glucose-Capillary: 94 mg/dL (ref 70–99)
Glucose-Capillary: 136 mg/dL — ABNORMAL HIGH (ref 70–99)

## 2013-12-13 LAB — BASIC METABOLIC PANEL
Anion gap: 17 — ABNORMAL HIGH (ref 5–15)
BUN: 6 mg/dL (ref 6–23)
CALCIUM: 9.1 mg/dL (ref 8.4–10.5)
CO2: 22 mEq/L (ref 19–32)
Chloride: 102 mEq/L (ref 96–112)
Creatinine, Ser: 0.42 mg/dL — ABNORMAL LOW (ref 0.50–1.10)
GFR calc Af Amer: >90 mL/min (ref >90)
GFR calc non Af Amer: >90 mL/min (ref >90)
Glucose, Bld: 87 mg/dL (ref 70–99)
Potassium: 3.2 mEq/L — ABNORMAL LOW (ref 3.7–5.3)
SODIUM: 141 meq/L (ref 137–147)

## 2013-12-13 LAB — PREPARE FRESH FROZEN PLASMA: Unit division: 0

## 2013-12-13 LAB — CBC
HEMATOCRIT: 33.6 % — AB (ref 36.0–46.0)
Hemoglobin: 11.2 g/dL — ABNORMAL LOW (ref 12.0–15.0)
MCH: 29.4 pg (ref 26.0–34.0)
MCHC: 33.3 g/dL (ref 30.0–36.0)
MCV: 88.2 fL (ref 78.0–100.0)
Platelets: 216 10*3/uL (ref 150–400)
RBC: 3.81 MIL/uL — AB (ref 3.87–5.11)
RDW: 15.8 % — AB (ref 11.5–15.5)
WBC: 6.6 10*3/uL (ref 4.0–10.5)

## 2013-12-13 LAB — HEMOGLOBIN A1C
Hgb A1c MFr Bld: 5.8 % — ABNORMAL HIGH (ref <5.7)
MEAN PLASMA GLUCOSE: 120 mg/dL — AB (ref <117)

## 2013-12-13 LAB — TSH: TSH: 2.5 u[IU]/mL (ref 0.350–4.500)

## 2013-12-13 LAB — MRSA PCR SCREENING: MRSA by PCR: NEGATIVE

## 2013-12-13 LAB — PROTIME-INR
INR: 1.15 (ref 0.00–1.49)
PROTHROMBIN TIME: 14.9 s (ref 11.6–15.2)

## 2013-12-13 LAB — VITAMIN B12: Vitamin B-12: 622 pg/mL (ref 211–911)

## 2013-12-13 MED ORDER — BOOST PLUS PO LIQD
237.0000 mL | Freq: Every day | ORAL | Status: DC
  Administered 2013-12-14: 237 mL via ORAL
  Filled 2013-12-13 (×5): qty 237

## 2013-12-13 MED ORDER — DEXTROSE 50 % IV SOLN
INTRAVENOUS | Status: AC
  Filled 2013-12-13: qty 50

## 2013-12-13 MED ORDER — WHITE PETROLATUM GEL
Status: AC
  Administered 2013-12-13: 06:00:00
  Filled 2013-12-13: qty 5

## 2013-12-13 MED ORDER — DIPHENHYDRAMINE HCL 25 MG PO CAPS: 25.0000 mg | ORAL_CAPSULE | Freq: Four times a day (QID) | ORAL | Status: DC | PRN

## 2013-12-13 MED ORDER — POTASSIUM CHLORIDE CRYS ER 20 MEQ PO TBCR
40.0000 meq | EXTENDED_RELEASE_TABLET | Freq: Once | ORAL | Status: AC
  Administered 2013-12-13: 40 meq via ORAL
  Filled 2013-12-13: qty 2

## 2013-12-13 MED ORDER — DEXTROSE 50 % IV SOLN
25.0000 mL | Freq: Once | INTRAVENOUS | Status: AC | PRN
  Administered 2013-12-13: 25 mL via INTRAVENOUS

## 2013-12-13 NOTE — Progress Notes (Signed)
INITIAL NUTRITION ASSESSMENT  DOCUMENTATION CODES Per approved criteria  -Not Applicable   INTERVENTION: Boost Plus daily, each supplement provides 360 kcal and 14 grams of protein RD to follow for nutrition care plan  NUTRITION DIAGNOSIS: Inadequate oral intake related to decreased appetite as evidenced by PO intake 0-100%  Goal: Pt to meet >/= 90% of their estimated nutrition needs   Monitor:  PO & supplemental intake, weight, labs, I/O's  Reason for Assessment: Malnutrition Screening Tool Report  80 y.o. female  Admitting Dx: Subdural hematoma  ASSESSMENT: 80 y.o. [place] with PMH of HTN, CAD, DM, COPD and chronic recurrent lower extremity DVT; admitted after a fall and left forehead laceration.  Pt currently in CT-IMAGING.  Pt known to Clinical Nutrition during recent hospital admission at [facility]; pt with hx of poor PO intake, constipation & reported weight loss; per previous nutrition assessment 12/02/13 pt typically consumes 2 meals per day with one Boost supplement daily; PO intake variable at 0-100% per flowsheet records; RD to add supplement daily.  RD unable to complete Nutrition Focused Physical Exam at this time.  Height: Ht Readings from Last 1 Encounters:  12/12/13 5\' 2"  (1.575 m)    Weight: Wt Readings from Last 1 Encounters:  12/12/13 119 lb (53.978 kg)    Ideal Body Weight: 110 lb  % Ideal Body Weight: 108%  Wt Readings from Last 10 Encounters:  12/12/13 119 lb (53.978 kg)  12/10/13 107 lb 5.8 oz (48.7 kg)  12/27/12 116 lb (52.617 kg)  12/27/12 116 lb (52.617 kg)  12/24/12 116 lb (52.617 kg)  12/12/12 116 lb 6.4 oz (52.799 kg)  09/11/12 116 lb 12.8 oz (52.98 kg)  07/02/12 116 lb 9.6 oz (52.889 kg)  06/25/12 116 lb 9.6 oz (52.889 kg)  06/21/12 116 lb 9.6 oz (52.889 kg)    Usual Body Weight: 116 lb  % Usual Body Weight: 103%  BMI:  Body mass index is 21.76 kg/(m^2).  Estimated Nutritional Needs: Kcal: 1200-1400 Protein: 60-70 gm Fluid:  >/= 1.5 L  Skin: forehead laceration  Diet Order: Heart Healthy/Carbohydrate Modified   EDUCATION NEEDS: -No education needs identified at this time   Intake/Output Summary (Last 24 hours) at 12/13/13 1147 Last data filed at 12/12/13 2335  Gross per 24 hour  Intake      0 ml  Output    250 ml  Net   -250 ml    Labs:   Recent Labs Lab 12/07/13 0510 12/09/13 0443 12/12/13 0738 12/13/13 0217  NA 139 140 140 141  K 3.4* 3.7 3.4* 3.2*  CL 105 105 101 102  CO2 23 23 27 22   BUN 5* 6 5* 6  CREATININE 0.46* 0.55 0.48* 0.42*  CALCIUM 8.8 8.7 9.0 9.1  PHOS 3.8  --   --   --   GLUCOSE 102* 104* 125* 87    CBG (last 3)   Recent Labs  12/12/13 2341 12/13/13 0805 12/13/13 0848  GLUCAP 108* 62* 146*    Scheduled Meds: . sodium chloride   Intravenous Once  . sodium chloride   Intravenous Once  . amLODipine  5 mg Oral Daily  . dextrose      . docusate sodium  100 mg Oral BID  . escitalopram  5 mg Oral QPM  . hydroxychloroquine  200 mg Oral BID  . insulin aspart  0-9 Units Subcutaneous TID WC  . insulin aspart  3 Units Subcutaneous TID WC  . isosorbide mononitrate  30 mg Oral q  morning - 10a  . Linaclotide  290 mcg Oral q morning - 10a  . lisinopril  10 mg Oral Daily  . multivitamin with minerals  1 tablet Oral Q1200  . omeprazole  20 mg Oral q morning - 10a  . polyethylene glycol  17 g Oral Daily  . potassium chloride  40 mEq Oral Once  . sodium chloride  3 mL Intravenous Q12H  . sulfaSALAzine  500 mg Oral TID    Continuous Infusions:   Past Medical History  Diagnosis Date  . History of  Angina     Treated with PCI in 2004; no active Symptoms.  . Hiatal hernia   . GERD (gastroesophageal reflux disease)   . Iron deficiency anemia   . Barrett esophagus   . Osteoporosis   . HLD (hyperlipidemia)     Statin intolerant  . Internal hemorrhoids   . Diverticulosis   . Pain     RIGHT SHOULDER WITH LIMITED ROM--STATES PREVIOUS RT TOTAL SHOULDDER REPLACEMENT-BUT  STILL HAS PROBLEMS WITH SHOULDER  . CAD S/P percutaneous coronary angioplasty 2004    PCI-RCA (2.41mm x 11mm Taxus DES)& Cx (2.75 mm x 14 mm Cypher DES)  . PAD (peripheral artery disease) 1994    History of right iliac stenting, fem-fem bypass in 94; aortobifem bypass/ AAA repair - November 2004; latest ABIs of been roughly 1 bilaterally, followed by Dr. Arbie Cookey  . HTN (hypertension)     sometimes  . Shortness of breath     MILD- WITH EXERTION. chronic  . Nocturia   . DVT (deep venous thrombosis)     BLE  . COPD (chronic obstructive pulmonary disease)   . Pneumonia     "several times"  . History of blood transfusion     "not sure why"  . History of stomach ulcers   . Migraine     "used to have them; not anymore" (08/22/2013)  . Stroke     "showed up on XRAY" (08/22/2013)  . Osteoarthritis of both hips      status post multiple hip operations, recent fall with fracture.    [name] DJD (degenerative joint disease)   . Rheumatoid arthritis   . Fall from slip, trip, or stumble 08/20/2013  . Closed hip fracture     Right  . Edema   . Hyperlipidemia   . Neuropathy     Past Surgical History  Procedure Laterality Date  . Back surgery      x 2  . Appendectomy  1950  . Total shoulder replacement Right 2011  . Cholecystectomy      several yrs ago  . Cataract extraction w/ intraocular lens  implant, bilateral Bilateral 2001-2002  . Total abdominal hysterectomy  1965  . Upper gastrointestinal endoscopy  multiple    w/bx, hiatal hernia, Barretts', esophageal polyp 1x  . Colonoscopy  05/09/2004; 06/08/1999    internal hemorrhoids; diverticulosis  . Shoulder open rotator cuff repair Bilateral     2 times on left  . Lumbar spine surgery  October 2012    spinal stenosis, Dr. Ophelia Charter  . Abdominal aortic aneurysm repair  2004    AortoBi-Fem Bypass  . Carpal tunnel release Left 04/2011  . Total hip arthroplasty  01/26/2012    Procedure: TOTAL HIP ARTHROPLASTY ANTERIOR APPROACH;  Surgeon: Kathryne Hitch, MD;  Location: [facility];  Service: Orthopedics;  Laterality: Left;  Left Total Hip Arthroplasty, Anterior Approach (C-Arm)  . Intramedullary (im) nail intertrochanteric Right 04/12/2012  Procedure: INTRAMEDULLARY (IM) NAIL INTERTROCHANTRIC Right;  Surgeon: Kathryne Hitch, MD;  Location: [facility];  Service: Orthopedics;  Laterality: Right;  . Doppler echocardiography  06/06/2012    EF 50-60%.  Mild LVH, grade one diastolic function; normal PA pressures and CVP  . Lower extremity venous dopplers  08/16/2012    Partially occlusive right femoral vein thrombosis, also noted in right peroneal vein; no thrombosis in left sided veins.  . Nm myoview ltd  April 2014    Bowel artifact, No ischemia or infarction  . Femoral-femoral bypass graft  1994    Following right iliac stenting  . Doppler echocardiography  06/06/2012    Mild LVH, EF 55-60%.  Normal wall motion.  Mildly elevated LVEDP with grade one diastolic function.  Mild LA dilatation.  Normal PA pressures.  . Tubal ligation  1956  . Excision/release bursa hip Right 12/27/2012    Procedure: RIGHT HIP BURSECTOMY AND COMPRESSION SCREW REMOVAL;  Surgeon: Kathryne Hitch, MD;  Location: [facility];  Service: Orthopedics;  Laterality: Right;  . Joint replacement    . Coronary angioplasty with stent placement  2004    RCA: Taxus DES 2.5 mm x 12 mm; Circumflex: Cypher DES 2.75 mm 14 mm  . Cardiac catheterization  October 2005    Patent RCA stent, 20% ISR of Cx stent; EF 50-55%  . Lexiscan cardiolite nuclear stress test      Maureen Chatters, RD, LDN Pager #: [phone] After-Hours Pager #: [phone]

## 2013-12-13 NOTE — Progress Notes (Signed)
Patient ID: [name], female   DOB: 12-05-1930, 80 y.o.   MRN: [id number] BP 117/55  Pulse 85  Temp(Src) 98.2 F (36.8 C) (Oral)  Resp 15  Ht 5\' 2"  (1.575 m)  Wt 53.978 kg (119 lb)  BMI 21.76 kg/m2  SpO2 97% Head CT reviewed. No concerns at all. No coumadin x 1 month. Will have to consult with primary care physician about future anticoagulation.

## 2013-12-13 NOTE — Consult Note (Signed)
Reason for Consult:subdural hematoma Referring Physician: vann,j  [name] is an 80 y.o. female.  HPI: with multiple falls. Yesterday she fell, did not lose consciousness, then went to the ED at [facility] to have laceration on head repaired. Head CT showed a tiny amount of subdural blood on the left tentorium. Transferred due to history of coumadin. No neurologic deficits on transfer.   Past Medical History  Diagnosis Date  . History of  Angina     Treated with PCI in 2004; no active Symptoms.  . Hiatal hernia   . GERD (gastroesophageal reflux disease)   . Iron deficiency anemia   . Barrett esophagus   . Osteoporosis   . HLD (hyperlipidemia)     Statin intolerant  . Internal hemorrhoids   . Diverticulosis   . Pain     RIGHT SHOULDER WITH LIMITED ROM--STATES PREVIOUS RT TOTAL SHOULDDER REPLACEMENT-BUT STILL HAS PROBLEMS WITH SHOULDER  . CAD S/P percutaneous coronary angioplasty 2004    PCI-RCA (2.63m x 132mTaxus DES)& Cx (2.75 mm x 14 mm Cypher DES)  . PAD (peripheral artery disease) 1994    History of right iliac stenting, fem-fem bypass in 94; aortobifem bypass/ AAA repair - November 2004; latest ABIs of been roughly 1 bilaterally, followed by Dr. EaDonnetta Hutching. HTN (hypertension)     sometimes  . Shortness of breath     MILD- WITH EXERTION. chronic  . Nocturia   . DVT (deep venous thrombosis)     BLE  . COPD (chronic obstructive pulmonary disease)   . Pneumonia     "several times"  . History of blood transfusion     "not sure why"  . History of stomach ulcers   . Migraine     "used to have them; not anymore" (08/22/2013)  . Stroke     "showed up on XRAY" (08/22/2013)  . Osteoarthritis of both hips      status post multiple hip operations, recent fall with fracture.    . Marland KitchenJD (degenerative joint disease)   . Rheumatoid arthritis   . Fall from slip, trip, or stumble 08/20/2013  . Closed hip fracture     Right  . Edema   . Hyperlipidemia   . Neuropathy     Past  Surgical History  Procedure Laterality Date  . Back surgery      x 2  . Appendectomy  1950  . Total shoulder replacement Right 2011  . Cholecystectomy      several yrs ago  . Cataract extraction w/ intraocular lens  implant, bilateral Bilateral 2001-2002  . Total abdominal hysterectomy  1965  . Upper gastrointestinal endoscopy  multiple    w/bx, hiatal hernia, Barretts', esophageal polyp 1x  . Colonoscopy  05/09/2004; 06/08/1999    internal hemorrhoids; diverticulosis  . Shoulder open rotator cuff repair Bilateral     2 times on left  . Lumbar spine surgery  October 2012    spinal stenosis, Dr. YaLorin [facility]. Abdominal aortic aneurysm repair  2004    AortoBi-Fem Bypass  . Carpal tunnel release Left 04/2011  . Total hip arthroplasty  01/26/2012    Procedure: TOTAL HIP ARTHROPLASTY ANTERIOR APPROACH;  Surgeon: ChMcarthur RossettiMD;  Location: [facility];  Service: Orthopedics;  Laterality: Left;  Left Total Hip Arthroplasty, Anterior Approach (C-Arm)  . Intramedullary (im) nail intertrochanteric Right 04/12/2012    Procedure: INTRAMEDULLARY (IM) NAIL INTERTROCHANTRIC Right;  Surgeon: ChMcarthur RossettiMD;  [facility] Service:  Orthopedics;  Laterality: Right;  . Doppler echocardiography  06/06/2012    EF 50-60%.  Mild LVH, grade one diastolic function; normal PA pressures and CVP  . Lower extremity venous dopplers  08/16/2012    Partially occlusive right femoral vein thrombosis, also noted in right peroneal vein; no thrombosis in left sided veins.  . Nm myoview ltd  April 2014    Bowel artifact, No ischemia or infarction  . Femoral-femoral bypass graft  1994    Following right iliac stenting  . Doppler echocardiography  06/06/2012    Mild LVH, EF 55-60%.  Normal wall motion.  Mildly elevated LVEDP with grade one diastolic function.  Mild LA dilatation.  Normal PA pressures.  . Tubal ligation  1956  . Excision/release bursa hip Right 12/27/2012    Procedure: RIGHT HIP BURSECTOMY  AND COMPRESSION SCREW REMOVAL;  Surgeon: Mcarthur Rossetti, MD;  Location: [facility];  Service: Orthopedics;  Laterality: Right;  . Joint replacement    . Coronary angioplasty with stent placement  2004    RCA: Taxus DES 2.5 mm x 12 mm; Circumflex: Cypher DES 2.75 mm 14 mm  . Cardiac catheterization  October 2005    Patent RCA stent, 20% ISR of Cx stent; EF 50-55%  . Lexiscan cardiolite nuclear stress test      Family History  Problem Relation Age of Onset  . Prostate cancer Father   . Diabetes Mother   . Colon cancer Neg Hx   . Breast cancer Mother   . Heart disease Mother     Social History:  reports that she quit smoking about 13 years ago. Her smoking use included Cigarettes. She has a 50 pack-year smoking history. She has never used smokeless tobacco. She reports that she does not drink alcohol or use illicit drugs.  Allergies:  Allergies  Allergen Reactions  . Codeine Nausea And Vomiting, Swelling and Rash  . Hydrocodone Nausea And Vomiting, Swelling and Rash    "mouth peeled"  . Morphine And Related Other (See Comments)    hallucinations  . Other Nausea And Vomiting    Most pain meds! other than tramadol. Tylenol, Toradol, and Robaxin are ok.    . Penicillins Hives  . Demerol [Meperidine] Nausea And Vomiting  . Propoxyphene Hives and Nausea And Vomiting  . Statins Other (See Comments)    INTOLERANT OF STATINS - messes up her legs and has muscle aches  . Tape Other (See Comments)    Needs paper tape - skin very fragile    Medications: I have reviewed the patient's current medications.  Results for orders placed during the hospital encounter of 12/12/13 (from the past 48 hour(s))  CBC WITH DIFFERENTIAL     Status: Abnormal   Collection Time    12/12/13  7:38 AM      Result Value Ref Range   WBC 9.0  4.0 - 10.5 K/uL   RBC 3.49 (*) 3.87 - 5.11 MIL/uL   Hemoglobin 10.4 (*) 12.0 - 15.0 g/dL   HCT 31.5 (*) 36.0 - 46.0 %   MCV 90.3  78.0 - 100.0 fL   MCH 29.8   26.0 - 34.0 pg   MCHC 33.0  30.0 - 36.0 g/dL   RDW 15.7 (*) 11.5 - 15.5 %   Platelets 186  150 - 400 K/uL   Neutrophils Relative % 82 (*) 43 - 77 %   Neutro Abs 7.3  1.7 - 7.7 K/uL   Lymphocytes Relative 6 (*) 12 - 46 %  Lymphs Abs 0.5 (*) 0.7 - 4.0 K/uL   Monocytes Relative 8  3 - 12 %   Monocytes Absolute 0.7  0.1 - 1.0 K/uL   Eosinophils Relative 4  0 - 5 %   Eosinophils Absolute 0.4  0.0 - 0.7 K/uL   Basophils Relative 0  0 - 1 %   Basophils Absolute 0.0  0.0 - 0.1 K/uL  PROTIME-INR     Status: Abnormal   Collection Time    12/12/13  7:38 AM      Result Value Ref Range   Prothrombin Time 15.7 (*) 11.6 - 15.2 seconds   INR 1.24  0.00 - 5.94  BASIC METABOLIC PANEL     Status: Abnormal   Collection Time    12/12/13  7:38 AM      Result Value Ref Range   Sodium 140  137 - 147 mEq/L   Potassium 3.4 (*) 3.7 - 5.3 mEq/L   Chloride 101  96 - 112 mEq/L   CO2 27  19 - 32 mEq/L   Glucose, Bld 125 (*) 70 - 99 mg/dL   BUN 5 (*) 6 - 23 mg/dL   Creatinine, Ser 0.48 (*) 0.50 - 1.10 mg/dL   Calcium 9.0  8.4 - 10.5 mg/dL   GFR calc non Af Amer 90 (*) >90 mL/min   GFR calc Af Amer >90  >90 mL/min   Comment: (NOTE)     The eGFR has been calculated using the CKD EPI equation.     This calculation has not been validated in all clinical situations.     eGFR's persistently <90 mL/min signify possible Chronic Kidney     Disease.   Anion gap 12  5 - 15  TYPE AND SCREEN     Status: None   Collection Time    12/12/13  7:38 AM      Result Value Ref Range   ABO/RH(D) A POS     Antibody Screen NEG     Sample Expiration 12/15/2013    PREPARE FRESH FROZEN PLASMA     Status: None   Collection Time    12/12/13  7:38 AM      Result Value Ref Range   Unit Number [id number]     Blood Component Type THAWED PLASMA     Unit division 00     Status of Unit ISSUED,FINAL     Transfusion Status OK TO TRANSFUSE    PROTIME-INR     Status: None   Collection Time    12/12/13 11:21 AM      Result  Value Ref Range   Prothrombin Time 13.8  11.6 - 15.2 seconds   INR 1.05  0.00 - 1.49  PROTIME-INR     Status: None   Collection Time    12/12/13  4:18 PM      Result Value Ref Range   Prothrombin Time 13.8  11.6 - 15.2 seconds   INR 1.04  0.00 - 1.49  MRSA PCR SCREENING     Status: None   Collection Time    12/12/13 10:45 PM      Result Value Ref Range   MRSA by PCR NEGATIVE  NEGATIVE   Comment:            The GeneXpert MRSA Assay (FDA     approved for NASAL specimens     only), is one component of a     comprehensive MRSA colonization     surveillance program. It is  not     intended to diagnose MRSA     infection nor to guide or     monitor treatment for     MRSA infections.  GLUCOSE, CAPILLARY     Status: Abnormal   Collection Time    12/12/13 11:41 PM      Result Value Ref Range   Glucose-Capillary 108 (*) 70 - 99 mg/dL  TSH     Status: None   Collection Time    12/12/13 11:57 PM      Result Value Ref Range   TSH 2.500  0.350 - 4.500 uIU/mL  BASIC METABOLIC PANEL     Status: Abnormal   Collection Time    12/13/13  2:17 AM      Result Value Ref Range   Sodium 141  137 - 147 mEq/L   Potassium 3.2 (*) 3.7 - 5.3 mEq/L   Chloride 102  96 - 112 mEq/L   CO2 22  19 - 32 mEq/L   Glucose, Bld 87  70 - 99 mg/dL   BUN 6  6 - 23 mg/dL   Creatinine, Ser 0.42 (*) 0.50 - 1.10 mg/dL   Calcium 9.1  8.4 - 10.5 mg/dL   GFR calc non Af Amer >90  >90 mL/min   GFR calc Af Amer >90  >90 mL/min   Comment: (NOTE)     The eGFR has been calculated using the CKD EPI equation.     This calculation has not been validated in all clinical situations.     eGFR's persistently <90 mL/min signify possible Chronic Kidney     Disease.   Anion gap 17 (*) 5 - 15  CBC     Status: Abnormal   Collection Time    12/13/13  2:17 AM      Result Value Ref Range   WBC 6.6  4.0 - 10.5 K/uL   RBC 3.81 (*) 3.87 - 5.11 MIL/uL   Hemoglobin 11.2 (*) 12.0 - 15.0 g/dL   HCT 33.6 (*) 36.0 - 46.0 %   MCV 88.2   78.0 - 100.0 fL   MCH 29.4  26.0 - 34.0 pg   MCHC 33.3  30.0 - 36.0 g/dL   RDW 15.8 (*) 11.5 - 15.5 %   Platelets 216  150 - 400 K/uL  GLUCOSE, CAPILLARY     Status: Abnormal   Collection Time    12/13/13  8:05 AM      Result Value Ref Range   Glucose-Capillary 62 (*) 70 - 99 mg/dL   Comment 1 Documented in Chart     Comment 2 Notify RN    GLUCOSE, CAPILLARY     Status: Abnormal   Collection Time    12/13/13  8:48 AM      Result Value Ref Range   Glucose-Capillary 146 (*) 70 - 99 mg/dL   Comment 1 Documented in Chart     Comment 2 Notify RN      Ct Head Wo Contrast  12/13/2013   CLINICAL DATA:  Subdural hematoma follow-up evaluation  EXAM: CT HEAD WITHOUT CONTRAST  TECHNIQUE: Contiguous axial images were obtained from the base of the skull through the vertex without intravenous contrast.  COMPARISON:  12/12/2013  FINDINGS: Diffuse atrophy and white matter disease stable. 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 Small focus of hyperattenuation in the left tentorium measuring about 2 cm in maximal dimension, stable. No other evidence of hemorrhage or extra-axial fluid. Left scalp laceration again  identified.  IMPRESSION: Stable small left subdural hematoma.   Electronically Signed   By: Skipper Cliche M.D.   On: 12/13/2013 08:07   Ct Head Wo Contrast  12/12/2013   CLINICAL DATA:  Head injury after fall in bedroom.  EXAM: CT HEAD WITHOUT CONTRAST  CT MAXILLOFACIAL WITHOUT CONTRAST  CT CERVICAL SPINE WITHOUT CONTRAST  TECHNIQUE: Multidetector CT imaging of the head, cervical spine, and maxillofacial structures were performed using the standard protocol without intravenous contrast. Multiplanar CT image reconstructions of the cervical spine and maxillofacial structures were also generated.  COMPARISON:  CT scan of August 20, 2013.  FINDINGS: CT HEAD FINDINGS  Bony calvarium appears intact. Left frontal scalp laceration is noted. Mild diffuse cortical atrophy is  noted. Mild chronic ischemic white matter disease is noted. 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 There is noted prominent increased density along the left tentorium which is more prominent compared to prior exams, and concerning for possible small subdural hematoma. No evidence of mass lesion or acute infarction is noted.  CT MAXILLOFACIAL FINDINGS  No fracture or other bony abnormality is noted. Paranasal sinuses appear normal. Pterygoid plates appear normal. Globes and orbits appear normal. Ostiomeatal complexes are widely patent.  CT CERVICAL SPINE FINDINGS  Visualized portions of upper lung fields appear normal. No definite fracture or significant spondylolisthesis is noted. Moderate degenerative disc disease is noted at C6-7 with anterior and posterior osteophyte formation hypertrophy of posterior facet joints is noted at C7-T1 secondary to degenerative joint disease.  IMPRESSION: Left frontal scalp laceration. Mild diffuse cortical atrophy. Mild chronic ischemic white matter disease.  Small acute left tentorial subdural hematoma.  No significant abnormality seen in the maxillofacial region.  Degenerative changes are again noted in the cervical spine. No fracture or significant spondylolisthesis is noted.  Critical Value/emergent results were called by telephone at the time of interpretation on 12/12/2013 at 7:23 am to Dr. Tanna Furry , who verbally acknowledged these results.   Electronically Signed   By: Sabino Dick M.D.   On: 12/12/2013 07:26   Ct Cervical Spine Wo Contrast  12/12/2013   CLINICAL DATA:  Head injury after fall in bedroom.  EXAM: CT HEAD WITHOUT CONTRAST  CT MAXILLOFACIAL WITHOUT CONTRAST  CT CERVICAL SPINE WITHOUT CONTRAST  TECHNIQUE: Multidetector CT imaging of the head, cervical spine, and maxillofacial structures were performed using the standard protocol without intravenous contrast. Multiplanar CT image reconstructions of the cervical spine and maxillofacial  structures were also generated.  COMPARISON:  CT scan of August 20, 2013.  FINDINGS: CT HEAD FINDINGS  Bony calvarium appears intact. Left frontal scalp laceration is noted. Mild diffuse cortical atrophy is noted. Mild chronic ischemic white matter disease is noted. 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 There is noted prominent increased density along the left tentorium which is more prominent compared to prior exams, and concerning for possible small subdural hematoma. No evidence of mass lesion or acute infarction is noted.  CT MAXILLOFACIAL FINDINGS  No fracture or other bony abnormality is noted. Paranasal sinuses appear normal. Pterygoid plates appear normal. Globes and orbits appear normal. Ostiomeatal complexes are widely patent.  CT CERVICAL SPINE FINDINGS  Visualized portions of upper lung fields appear normal. No definite fracture or significant spondylolisthesis is noted. Moderate degenerative disc disease is noted at C6-7 with anterior and posterior osteophyte formation hypertrophy of posterior facet joints is noted at C7-T1 secondary to degenerative joint disease.  IMPRESSION: Left frontal scalp laceration. Mild diffuse cortical atrophy.  Mild chronic ischemic white matter disease.  Small acute left tentorial subdural hematoma.  No significant abnormality seen in the maxillofacial region.  Degenerative changes are again noted in the cervical spine. No fracture or significant spondylolisthesis is noted.  Critical Value/emergent results were called by telephone at the time of interpretation on 12/12/2013 at 7:23 am to Dr. Tanna Furry , who verbally acknowledged these results.   Electronically Signed   By: Sabino Dick M.D.   On: 12/12/2013 07:26   Ct Maxillofacial Wo Cm  12/12/2013   CLINICAL DATA:  Head injury after fall in bedroom.  EXAM: CT HEAD WITHOUT CONTRAST  CT MAXILLOFACIAL WITHOUT CONTRAST  CT CERVICAL SPINE WITHOUT CONTRAST  TECHNIQUE: Multidetector CT imaging of the head,  cervical spine, and maxillofacial structures were performed using the standard protocol without intravenous contrast. Multiplanar CT image reconstructions of the cervical spine and maxillofacial structures were also generated.  COMPARISON:  CT scan of August 20, 2013.  FINDINGS: CT HEAD FINDINGS  Bony calvarium appears intact. Left frontal scalp laceration is noted. Mild diffuse cortical atrophy is noted. Mild chronic ischemic white matter disease is noted. 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 There is noted prominent increased density along the left tentorium which is more prominent compared to prior exams, and concerning for possible small subdural hematoma. No evidence of mass lesion or acute infarction is noted.  CT MAXILLOFACIAL FINDINGS  No fracture or other bony abnormality is noted. Paranasal sinuses appear normal. Pterygoid plates appear normal. Globes and orbits appear normal. Ostiomeatal complexes are widely patent.  CT CERVICAL SPINE FINDINGS  Visualized portions of upper lung fields appear normal. No definite fracture or significant spondylolisthesis is noted. Moderate degenerative disc disease is noted at C6-7 with anterior and posterior osteophyte formation hypertrophy of posterior facet joints is noted at C7-T1 secondary to degenerative joint disease.  IMPRESSION: Left frontal scalp laceration. Mild diffuse cortical atrophy. Mild chronic ischemic white matter disease.  Small acute left tentorial subdural hematoma.  No significant abnormality seen in the maxillofacial region.  Degenerative changes are again noted in the cervical spine. No fracture or significant spondylolisthesis is noted.  Critical Value/emergent results were called by telephone at the time of interpretation on 12/12/2013 at 7:23 am to Dr. Tanna Furry , who verbally acknowledged these results.   Electronically Signed   By: Sabino Dick M.D.   On: 12/12/2013 07:26    Review of Systems  Constitutional: Negative.    Eyes: Negative.   Respiratory: Negative.   Cardiovascular: Negative.   Gastrointestinal: Negative.   Genitourinary: Negative.   Musculoskeletal: Positive for falls.  Skin: Negative.   Neurological: Positive for headaches.  Endo/Heme/Allergies: Negative.   Psychiatric/Behavioral: Negative.    Blood pressure 117/55, pulse 85, temperature 98.2 F (36.8 C), temperature source Oral, resp. rate 15, height 5' 2"  (1.575 m), weight 53.978 kg (119 lb), SpO2 97.00%. Physical Exam  Constitutional: She is oriented to person, place, and time. She appears well-developed and well-nourished. No distress.  HENT:  Nose: Nose normal.  Mouth/Throat: Oropharynx is clear and moist.  Bruising over forehead, ecchymotic  Eyes: Conjunctivae and EOM are normal. Pupils are equal, round, and reactive to light.  Neck: Normal range of motion. Neck supple.  Cardiovascular: Intact distal pulses.   Respiratory: Effort normal and breath sounds normal.  GI: Soft. Bowel sounds are normal.  Musculoskeletal: Normal range of motion.  Neurological: She is alert and oriented to person, place, and time. She has normal reflexes. She displays  normal reflexes. No cranial nerve deficit. She exhibits normal muscle tone. Coordination normal.  Skin: Skin is warm and dry.  Psychiatric: She has a normal mood and affect. Her behavior is normal. Judgment and thought content normal.    Assessment/Plan: normal neurologic exam, doing well.  Repeat head ct is stable. No neurosurgical issues. Must be off coumadin until falling is dealt with. No need for followup from my standpoint.   Myrel Rappleye L 12/13/2013, 9:17 AM

## 2013-12-13 NOTE — Progress Notes (Signed)
VASCULAR LAB PRELIMINARY  PRELIMINARY  PRELIMINARY  PRELIMINARY  Bilateral lower extremity venous Dopplers completed.    Preliminary report:  There is no obvious evidence of acute DVT noted in the bilateral lower extremities.   Dyer Klug, RVT 12/13/2013, 4:27 PM

## 2013-12-13 NOTE — Progress Notes (Signed)
PROGRESS NOTE  [name] RWE:[id number] DOB: 1930-03-18 DOA: 12/12/2013 PCP: Hoyle Sauer, MD  Assessment/Plan: Subdural hematoma  - neurosurgeon  Transfer to  and rapid reversal of Coumadin.  - repeat CT scan of the head was stable   Laceration of head  -Has already been closed.   Frequent falls  -Resulting in significant injury.  -TSH ok B12 pending  -Will request PT/OT consultations- legs uneven after hip surgery -Suspect we'll need skilled nursing facility.   Recurrent, chronic DVT of lower extremity  -Hold off on Coumadin.  -check duplex -patient will need to be off coumadin until bleeding stops   Diabetes  -Check hemoglobin A1c.  -Start sensitive sliding scale.   Hypertension  -Norvasc, clonidine, lisinopril, Imdur  -Will also place on when necessary hydralazine for systolic blood pressure greater than 170.  Hypokalemia -replete   Code Status: full Family Communication: patient/family at bedside Disposition Plan: tx to med surge floor?   Consultants:  neurosurgery  Procedures:      HPI/Subjective: No SOB, no CP  Objective: Filed Vitals:   12/13/13 0806  BP: 117/55  Pulse: 85  Temp: 98.2 F (36.8 C)  Resp: 15    Intake/Output Summary (Last 24 hours) at 12/13/13 0938 Last data filed at 12/12/13 2335  Gross per 24 hour  Intake    320 ml  Output    500 ml  Net   -180 ml   Filed Weights   12/12/13 0558  Weight: 53.978 kg (119 lb)    Exam:   General:  A+Ox3, NAD  Cardiovascular: rrr  Respiratory: clear  Abdomen: +Bs, soft  Musculoskeletal: no edema  Data Reviewed: Basic Metabolic Panel:  Recent Labs Lab 12/07/13 0510 12/09/13 0443 12/12/13 0738 12/13/13 0217  NA 139 140 140 141  K 3.4* 3.7 3.4* 3.2*  CL 105 105 101 102  CO2 23 23 27 22   GLUCOSE 102* 104* 125* 87  BUN 5* 6 5* 6  CREATININE 0.46* 0.55 0.48* 0.42*  CALCIUM 8.8 8.7 9.0 9.1  PHOS 3.8  --   --   --    Liver Function  Tests:  Recent Labs Lab 12/07/13 0510  ALBUMIN 2.8*   No results found for this basename: LIPASE, AMYLASE,  in the last 168 hours No results found for this basename: AMMONIA,  in the last 168 hours CBC:  Recent Labs Lab 12/07/13 0510 12/08/13 0432 12/09/13 0443 12/10/13 0408 12/12/13 0738 12/13/13 0217  WBC 4.5 4.8 4.8 6.7 9.0 6.6  NEUTROABS 2.7  --   --   --  7.3  --   HGB 9.6* 10.0* 9.7* 9.6* 10.4* 11.2*  HCT 28.4* 30.1* 29.2* 29.3* 31.5* 33.6*  MCV 87.4 89.6 89.3 90.4 90.3 88.2  PLT 193 180 198 175 186 216   Cardiac Enzymes: No results found for this basename: CKTOTAL, CKMB, CKMBINDEX, TROPONINI,  in the last 168 hours BNP (last 3 results) No results found for this basename: PROBNP,  in the last 8760 hours CBG:  Recent Labs Lab 12/12/13 2341 12/13/13 0805 12/13/13 0848  GLUCAP 108* 62* 146*    Recent Results (from the past 240 hour(s))  MRSA PCR SCREENING     Status: None   Collection Time    12/12/13 10:45 PM      Result Value Ref Range Status   MRSA by PCR NEGATIVE  NEGATIVE Final   Comment:            The GeneXpert MRSA Assay (FDA  approved for NASAL specimens     only), is one component of a     comprehensive MRSA colonization     surveillance program. It is not     intended to diagnose MRSA     infection nor to guide or     monitor treatment for     MRSA infections.     Studies: Ct Head Wo Contrast  12/13/2013   CLINICAL DATA:  Subdural hematoma follow-up evaluation  EXAM: CT HEAD WITHOUT CONTRAST  TECHNIQUE: Contiguous axial images were obtained from the base of the skull through the vertex without intravenous contrast.  COMPARISON:  12/12/2013  FINDINGS: Diffuse atrophy and white matter disease stable. 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 Small focus of hyperattenuation in the left tentorium measuring about 2 cm in maximal dimension, stable. No other evidence of hemorrhage or extra-axial fluid. Left scalp  laceration again identified.  IMPRESSION: Stable small left subdural hematoma.   Electronically Signed   By: Esperanza Heir M.D.   On: 12/13/2013 08:07   Ct Head Wo Contrast  12/12/2013   CLINICAL DATA:  Head injury after fall in bedroom.  EXAM: CT HEAD WITHOUT CONTRAST  CT MAXILLOFACIAL WITHOUT CONTRAST  CT CERVICAL SPINE WITHOUT CONTRAST  TECHNIQUE: Multidetector CT imaging of the head, cervical spine, and maxillofacial structures were performed using the standard protocol without intravenous contrast. Multiplanar CT image reconstructions of the cervical spine and maxillofacial structures were also generated.  COMPARISON:  CT scan of August 20, 2013.  FINDINGS: CT HEAD FINDINGS  Bony calvarium appears intact. Left frontal scalp laceration is noted. Mild diffuse cortical atrophy is noted. Mild chronic ischemic white matter disease is noted. 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 There is noted prominent increased density along the left tentorium which is more prominent compared to prior exams, and concerning for possible small subdural hematoma. No evidence of mass lesion or acute infarction is noted.  CT MAXILLOFACIAL FINDINGS  No fracture or other bony abnormality is noted. Paranasal sinuses appear normal. Pterygoid plates appear normal. Globes and orbits appear normal. Ostiomeatal complexes are widely patent.  CT CERVICAL SPINE FINDINGS  Visualized portions of upper lung fields appear normal. No definite fracture or significant spondylolisthesis is noted. Moderate degenerative disc disease is noted at C6-7 with anterior and posterior osteophyte formation hypertrophy of posterior facet joints is noted at C7-T1 secondary to degenerative joint disease.  IMPRESSION: Left frontal scalp laceration. Mild diffuse cortical atrophy. Mild chronic ischemic white matter disease.  Small acute left tentorial subdural hematoma.  No significant abnormality seen in the maxillofacial region.  Degenerative  changes are again noted in the cervical spine. No fracture or significant spondylolisthesis is noted.  Critical Value/emergent results were called by telephone at the time of interpretation on 12/12/2013 at 7:23 am to Dr. Rolland Porter , who verbally acknowledged these results.   Electronically Signed   By: Roque Lias M.D.   On: 12/12/2013 07:26   Ct Cervical Spine Wo Contrast  12/12/2013   CLINICAL DATA:  Head injury after fall in bedroom.  EXAM: CT HEAD WITHOUT CONTRAST  CT MAXILLOFACIAL WITHOUT CONTRAST  CT CERVICAL SPINE WITHOUT CONTRAST  TECHNIQUE: Multidetector CT imaging of the head, cervical spine, and maxillofacial structures were performed using the standard protocol without intravenous contrast. Multiplanar CT image reconstructions of the cervical spine and maxillofacial structures were also generated.  COMPARISON:  CT scan of August 20, 2013.  FINDINGS: CT HEAD FINDINGS  Bony calvarium appears  intact. Left frontal scalp laceration is noted. Mild diffuse cortical atrophy is noted. Mild chronic ischemic white matter disease is noted. 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 There is noted prominent increased density along the left tentorium which is more prominent compared to prior exams, and concerning for possible small subdural hematoma. No evidence of mass lesion or acute infarction is noted.  CT MAXILLOFACIAL FINDINGS  No fracture or other bony abnormality is noted. Paranasal sinuses appear normal. Pterygoid plates appear normal. Globes and orbits appear normal. Ostiomeatal complexes are widely patent.  CT CERVICAL SPINE FINDINGS  Visualized portions of upper lung fields appear normal. No definite fracture or significant spondylolisthesis is noted. Moderate degenerative disc disease is noted at C6-7 with anterior and posterior osteophyte formation hypertrophy of posterior facet joints is noted at C7-T1 secondary to degenerative joint disease.  IMPRESSION: Left frontal scalp laceration.  Mild diffuse cortical atrophy. Mild chronic ischemic white matter disease.  Small acute left tentorial subdural hematoma.  No significant abnormality seen in the maxillofacial region.  Degenerative changes are again noted in the cervical spine. No fracture or significant spondylolisthesis is noted.  Critical Value/emergent results were called by telephone at the time of interpretation on 12/12/2013 at 7:23 am to Dr. Rolland Porter , who verbally acknowledged these results.   Electronically Signed   By: Roque Lias M.D.   On: 12/12/2013 07:26   Ct Maxillofacial Wo Cm  12/12/2013   CLINICAL DATA:  Head injury after fall in bedroom.  EXAM: CT HEAD WITHOUT CONTRAST  CT MAXILLOFACIAL WITHOUT CONTRAST  CT CERVICAL SPINE WITHOUT CONTRAST  TECHNIQUE: Multidetector CT imaging of the head, cervical spine, and maxillofacial structures were performed using the standard protocol without intravenous contrast. Multiplanar CT image reconstructions of the cervical spine and maxillofacial structures were also generated.  COMPARISON:  CT scan of August 20, 2013.  FINDINGS: CT HEAD FINDINGS  Bony calvarium appears intact. Left frontal scalp laceration is noted. Mild diffuse cortical atrophy is noted. Mild chronic ischemic white matter disease is noted. 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 There is noted prominent increased density along the left tentorium which is more prominent compared to prior exams, and concerning for possible small subdural hematoma. No evidence of mass lesion or acute infarction is noted.  CT MAXILLOFACIAL FINDINGS  No fracture or other bony abnormality is noted. Paranasal sinuses appear normal. Pterygoid plates appear normal. Globes and orbits appear normal. Ostiomeatal complexes are widely patent.  CT CERVICAL SPINE FINDINGS  Visualized portions of upper lung fields appear normal. No definite fracture or significant spondylolisthesis is noted. Moderate degenerative disc disease is noted at C6-7  with anterior and posterior osteophyte formation hypertrophy of posterior facet joints is noted at C7-T1 secondary to degenerative joint disease.  IMPRESSION: Left frontal scalp laceration. Mild diffuse cortical atrophy. Mild chronic ischemic white matter disease.  Small acute left tentorial subdural hematoma.  No significant abnormality seen in the maxillofacial region.  Degenerative changes are again noted in the cervical spine. No fracture or significant spondylolisthesis is noted.  Critical Value/emergent results were called by telephone at the time of interpretation on 12/12/2013 at 7:23 am to Dr. Rolland Porter , who verbally acknowledged these results.   Electronically Signed   By: Roque Lias M.D.   On: 12/12/2013 07:26    Scheduled Meds: . sodium chloride   Intravenous Once  . sodium chloride   Intravenous Once  . amLODipine  5 mg Oral Daily  . dextrose      .  docusate sodium  100 mg Oral BID  . escitalopram  5 mg Oral QPM  . hydroxychloroquine  200 mg Oral BID  . insulin aspart  0-9 Units Subcutaneous TID WC  . insulin aspart  3 Units Subcutaneous TID WC  . isosorbide mononitrate  30 mg Oral q morning - 10a  . Linaclotide  290 mcg Oral q morning - 10a  . lisinopril  10 mg Oral Daily  . multivitamin with minerals  1 tablet Oral Q1200  . omeprazole  20 mg Oral q morning - 10a  . polyethylene glycol  17 g Oral Daily  . sodium chloride  3 mL Intravenous Q12H  . sulfaSALAzine  500 mg Oral TID   Continuous Infusions:  Antibiotics Given (last 72 hours)   None      Principal Problem:   Subdural hematoma Active Problems:   HTN (hypertension)   DM (diabetes mellitus), type 2, uncontrolled with complications   Rectus sheath hematoma   DVT, recurrent, lower extremity, chronic   Laceration of head   SDH (subdural hematoma)    Time spent: 35 min    Katiejo Gilroy  [facility] Pager [phone]. If 7PM-7AM, please contact night-coverage at [email or web address], password  [facility] 12/13/2013, 9:38 AM  LOS: 1 day

## 2013-12-14 ENCOUNTER — Inpatient Hospital Stay (HOSPITAL_COMMUNITY): Payer: Medicare Other

## 2013-12-14 DIAGNOSIS — W19XXXS Unspecified fall, sequela: Secondary | ICD-10-CM

## 2013-12-14 LAB — URINE MICROSCOPIC-ADD ON

## 2013-12-14 LAB — URINALYSIS, ROUTINE W REFLEX MICROSCOPIC
GLUCOSE, UA: NEGATIVE mg/dL
Hgb urine dipstick: NEGATIVE
KETONES UR: 15 mg/dL — AB
NITRITE: NEGATIVE
PH: 6 (ref 5.0–8.0)
Protein, ur: 30 mg/dL — AB
SPECIFIC GRAVITY, URINE: 1.023 (ref 1.005–1.030)
Urobilinogen, UA: 1 mg/dL (ref 0.0–1.0)

## 2013-12-14 LAB — BASIC METABOLIC PANEL
Anion gap: 12 (ref 5–15)
BUN: 12 mg/dL (ref 6–23)
CO2: 25 meq/L (ref 19–32)
Calcium: 9.1 mg/dL (ref 8.4–10.5)
Chloride: 102 mEq/L (ref 96–112)
Creatinine, Ser: 0.59 mg/dL (ref 0.50–1.10)
GFR calc Af Amer: >90 mL/min (ref >90)
GFR calc non Af Amer: 84 mL/min — ABNORMAL LOW (ref >90)
GLUCOSE: 90 mg/dL (ref 70–99)
Potassium: 4 mEq/L (ref 3.7–5.3)
SODIUM: 139 meq/L (ref 137–147)

## 2013-12-14 LAB — PROTIME-INR
INR: 1.24 (ref 0.00–1.49)
PROTHROMBIN TIME: 15.7 s — AB (ref 11.6–15.2)

## 2013-12-14 LAB — CBC
HEMATOCRIT: 31.1 % — AB (ref 36.0–46.0)
HEMOGLOBIN: 10 g/dL — AB (ref 12.0–15.0)
MCH: 28.8 pg (ref 26.0–34.0)
MCHC: 32.2 g/dL (ref 30.0–36.0)
MCV: 89.6 fL (ref 78.0–100.0)
Platelets: 218 10*3/uL (ref 150–400)
RBC: 3.47 MIL/uL — AB (ref 3.87–5.11)
RDW: 16.2 % — ABNORMAL HIGH (ref 11.5–15.5)
WBC: 5.6 10*3/uL (ref 4.0–10.5)

## 2013-12-14 LAB — GLUCOSE, CAPILLARY: Glucose-Capillary: 87 mg/dL (ref 70–99)

## 2013-12-14 MED ORDER — CEPHALEXIN 500 MG PO CAPS
500.0000 mg | ORAL_CAPSULE | Freq: Two times a day (BID) | ORAL | Status: DC
  Administered 2013-12-14 – 2013-12-15 (×3): 500 mg via ORAL
  Filled 2013-12-14 (×3): qty 1

## 2013-12-14 NOTE — Progress Notes (Addendum)
PROGRESS NOTE  [name] GGY:[id number] DOB: [birth year] DOA: 12/12/2013 PCP: Hoyle Sauer, MD  Assessment/Plan: Subdural hematoma  - neurosurgeon- signed off - repeat CT scan of the head was stable   Laceration of head  -Has already been closed.   Frequent falls  -Resulting in significant injury.  -TSH ok B12 ok -PT- home health  ?UTI -keflex x 3 days  Recurrent, chronic DVT of lower extremity  -d/c Coumadin.  -duplex negative for DVTs    Hypertension  -Norvasc, clonidine, lisinopril, Imdur  -Will also place on when necessary hydralazine for systolic blood pressure greater than 170.  Hypokalemia -replete  Right shoulder pain and left rib pain -check x ray  Facial and left arm rash/redness- mild appearing Sulfasalazine d/c'd as nurse states it started after this medication- med started earlier this year -monitor  Code Status: full Family Communication: patient/family at bedside 10/24 Disposition Plan: home in AM    Consultants:  neurosurgery  Procedures:      HPI/Subjective: Pain on left side, hurts with deep breathing Right posterior shoulder pain  Objective: Filed Vitals:   12/14/13 0939  BP: 131/64  Pulse: 82  Temp: 97.8 F (36.6 C)  Resp: 20    Intake/Output Summary (Last 24 hours) at 12/14/13 0955 Last data filed at 12/14/13 0724  Gross per 24 hour  Intake    120 ml  Output    200 ml  Net    -80 ml   Filed Weights   12/12/13 0558  Weight: 53.978 kg (119 lb)    Exam:   General:  A+Ox3, NAD, mild redness on b/l face  Cardiovascular: rrr  Respiratory: clear  Abdomen: +Bs, soft  Musculoskeletal: no edema, right arm with splotchy patches, multiple skin tears  Data Reviewed: Basic Metabolic Panel:  Recent Labs Lab 12/09/13 0443 12/12/13 0738 12/13/13 0217 12/14/13 0450  NA 140 140 141 139  K 3.7 3.4* 3.2* 4.0  CL 105 101 102 102  CO2 23 27 22 25   GLUCOSE 104* 125* 87 90  BUN 6 5* 6 12  CREATININE  0.55 0.48* 0.42* 0.59  CALCIUM 8.7 9.0 9.1 9.1   Liver Function Tests: No results found for this basename: AST, ALT, ALKPHOS, BILITOT, PROT, ALBUMIN,  in the last 168 hours No results found for this basename: LIPASE, AMYLASE,  in the last 168 hours No results found for this basename: AMMONIA,  in the last 168 hours CBC:  Recent Labs Lab 12/09/13 0443 12/10/13 0408 12/12/13 0738 12/13/13 0217 12/14/13 0450  WBC 4.8 6.7 9.0 6.6 5.6  NEUTROABS  --   --  7.3  --   --   HGB 9.7* 9.6* 10.4* 11.2* 10.0*  HCT 29.2* 29.3* 31.5* 33.6* 31.1*  MCV 89.3 90.4 90.3 88.2 89.6  PLT 198 175 186 216 218   Cardiac Enzymes: No results found for this basename: CKTOTAL, CKMB, CKMBINDEX, TROPONINI,  in the last 168 hours BNP (last 3 results) No results found for this basename: PROBNP,  in the last 8760 hours CBG:  Recent Labs Lab 12/13/13 0848 12/13/13 1218 12/13/13 1639 12/13/13 2204 12/14/13 0657  GLUCAP 146* 101* 94 136* 87    Recent Results (from the past 240 hour(s))  MRSA PCR SCREENING     Status: None   Collection Time    12/12/13 10:45 PM      Result Value Ref Range Status   MRSA by PCR NEGATIVE  NEGATIVE Final   Comment:  The GeneXpert MRSA Assay (FDA     approved for NASAL specimens     only), is one component of a     comprehensive MRSA colonization     surveillance program. It is not     intended to diagnose MRSA     infection nor to guide or     monitor treatment for     MRSA infections.     Studies: Ct Head Wo Contrast  12/13/2013   CLINICAL DATA:  Subdural hematoma follow-up evaluation  EXAM: CT HEAD WITHOUT CONTRAST  TECHNIQUE: Contiguous axial images were obtained from the base of the skull through the vertex without intravenous contrast.  COMPARISON:  12/12/2013  FINDINGS: Diffuse atrophy and white matter disease stable. 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 Small focus of hyperattenuation in the left tentorium  measuring about 2 cm in maximal dimension, stable. No other evidence of hemorrhage or extra-axial fluid. Left scalp laceration again identified.  IMPRESSION: Stable small left subdural hematoma.   Electronically Signed   By: Esperanza Heir M.D.   On: 12/13/2013 08:07    Scheduled Meds: . amLODipine  5 mg Oral Daily  . cephALEXin  500 mg Oral Q12H  . docusate sodium  100 mg Oral BID  . escitalopram  5 mg Oral QPM  . hydroxychloroquine  200 mg Oral BID  . isosorbide mononitrate  30 mg Oral q morning - 10a  . lactose free nutrition  237 mL Oral Q1500  . Linaclotide  290 mcg Oral q morning - 10a  . lisinopril  10 mg Oral Daily  . multivitamin with minerals  1 tablet Oral Q1200  . omeprazole  20 mg Oral q morning - 10a  . polyethylene glycol  17 g Oral Daily  . sodium chloride  3 mL Intravenous Q12H   Continuous Infusions:  Antibiotics Given (last 72 hours)   Date/Time Action Medication Dose   12/13/13 1149 Given   hydroxychloroquine (PLAQUENIL) tablet 200 mg 200 mg   12/13/13 2122 Given   hydroxychloroquine (PLAQUENIL) tablet 200 mg 200 mg      Principal Problem:   Subdural hematoma Active Problems:   HTN (hypertension)   Rheumatoid arthritis   DM (diabetes mellitus), type 2, uncontrolled with complications   Rectus sheath hematoma   DVT, recurrent, lower extremity, chronic   Laceration of head   SDH (subdural hematoma)    Time spent: 25 min    Byard Carranza  [facility] Pager [phone]. If 7PM-7AM, please contact night-coverage at [email or web address], password [facility] 12/14/2013, 9:55 AM  LOS: 2 days

## 2013-12-14 NOTE — Evaluation (Addendum)
Occupational Therapy Evaluation Patient Details Name: [name] MRN: [id number] DOB: September 19, 1930 Today's Date: 12/14/2013    History of Present Illness 80 y/o F admitted 12/12/13 s/p fall as she was getting onto her [facility] (pt reports she was sitting down and she and BSC "tipped over" onto her Lt side). Denied dizziness or vertigo. PMhx-d/c'd from [facility] 12/10/13 s/p rectus hematoma (on Coumadin for recurrent DVT's), T9-10 vertebroplasty 11/2013; L THR (anterior approach); Rt femur ORIF; Rt TSA;  angina, HTN, COPD, stroke, RA, OA, DJD    Clinical Impression   Pt s/p above. Education provided during session and pt has family available to assist. Pt OT needs can be met in next venue of care.     Follow Up Recommendations  Home health OT;Supervision/Assistance - 24 hour    Equipment Recommendations  None recommended by OT    Recommendations for Other Services       Precautions / Restrictions Precautions Precautions: Fall; back (reviewed back precautions) Restrictions Weight Bearing Restrictions: No      Mobility Bed Mobility Overal bed mobility: Needs Assistance Bed Mobility: Rolling;Sidelying to Sit;Sit to Supine Rolling: Supervision Sidelying to sit: Supervision   Sit to supine: Modified independent (Device/Increase time) (discussed log roll technique)   General bed mobility comments: discussed log roll technique. Pt performed more of sit to supine technique to return to bed.  Transfers Overall transfer level: Needs assistance Equipment used: Rolling walker (2 wheeled) Transfers: Sit to/from Stand Sit to Stand: Supervision              Balance                                            ADL Overall ADL's : Needs assistance/impaired                     Lower Body Dressing: Minimal assistance;Sit to/from stand   Toilet Transfer: Min guard;Ambulation;RW (bed)       Tub/ Shower Transfer: Minimal assistance;Ambulation;Rolling walker    Functional mobility during ADLs: Min guard;Rolling walker (bed) General ADL Comments: Educated on use of cup for teeth care and placement of grooming items to avoid bending/twisting. Practiced stepping over simulated tub and also discussed sitting on chair and swinging legs in and trying to avoid twisting. Recommended someone be with her for shower transfer. Educated on safety-use of bag on walker, rugs, sitting for LB ADLs. Educated on energy conservation techniques and increasing activity tolerance. Educated on AE and pt practiced. talked about hugging pillow to help with pain in ribs.     Vision                     Perception     Praxis      Pertinent Vitals/Pain Pain Assessment: 0-10 Pain Score: 5  Pain Location: ribs Pain Intervention(s): Repositioned     Hand Dominance     Extremity/Trunk Assessment Upper Extremity Assessment Upper Extremity Assessment: Overall WFL for tasks assessed   Lower Extremity Assessment Lower Extremity Assessment: Defer to PT evaluation   Cervical / Trunk Assessment Cervical / Trunk Assessment: Kyphotic   Communication Communication Communication: No difficulties   Cognition Arousal/Alertness: Awake/alert Behavior During Therapy: WFL for tasks assessed/performed Overall Cognitive Status: Within Functional Limits for tasks assessed  General Comments       Exercises       Shoulder Instructions      Home Living Family/patient expects to be discharged to:: Private residence Living Arrangements: Other relatives (sister) Available Help at Discharge: Family;Available 24 hours/day Type of Home: House Home Access: Level entry     Home Layout: One level     Bathroom Shower/Tub: Tub/shower unit         Home Equipment: Environmental consultant - 2 wheels;Cane - single point;Walker - 4 wheels;Bedside commode;Shower seat   Additional Comments: Sister used to run a rest home (no longer has any residents)       Prior Functioning/Environment Level of Independence: Needs assistance    ADL's / Homemaking Assistance Needed: sister recently helping with ADLs   Comments: has used RW for ~3 years    OT Diagnosis: Acute pain   OT Problem List: Decreased strength;Decreased activity tolerance;Decreased knowledge of use of DME or AE;Decreased knowledge of precautions;Pain   OT Treatment/Interventions:      OT Goals(Current goals can be found in the care plan section)   OT Frequency:     Barriers to D/C:            Co-evaluation              End of Session Equipment Utilized During Treatment: Gait belt;Rolling walker  Activity Tolerance: Patient tolerated treatment well Patient left: in bed;with call bell/phone within reach;with bed alarm set   Time: 1333-1404 OT Time Calculation (min): 31 min Charges:  OT General Charges $OT Visit: 1 Procedure OT Evaluation $Initial OT Evaluation Tier I: 1 Procedure OT Treatments $Self Care/Home Management : 8-22 mins G-CodesBenito Mccreedy OTR/L [phone] 12/14/2013, 4:16 PM

## 2013-12-14 NOTE — Evaluation (Signed)
Physical Therapy Evaluation Patient Details Name: [name] MRN: [id number] DOB: 08-Dec-1930 Today's Date: 12/14/2013   History of Present Illness  80 y/o F admitted 12/12/13 s/p fall as she was getting onto her [facility] (pt reports she was sitting down and she and BSC "tipped over" onto her Lt side). Denied dizziness or vertigo. PMhx-d/c'd from [facility] 12/10/13 s/p rectus hematoma (on Coumadin for recurrent DVT's), T9-10 vertebroplasty 11/2013; L THR (anterior approach); Rt femur ORIF; Rt TSA;  angina, HTN, COPD, stroke, RA, OA, DJD   Clinical Impression  Pt admitted with fall and small SDH. Pt denies dizziness at time of fall "just fell over" when trying to sit onto [facility]. Was using RW at the time. Pt demonstrates excellent knowledge of use of RW and denies h/o dizziness or vertigo. Unclear cause of fall (although RN reports pt results just showed + for UTI). Pt has 24 hour care at home and do not feel therapy at SNF is indicated at this time. Pt adamantly refuses anything except home with HHPT. Pt currently with functional limitations due to the deficits listed below (see PT Problem List).  Pt will benefit from skilled PT to increase their independence and safety with mobility to allow discharge to the venue listed below.       Follow Up Recommendations Home health PT;Supervision/Assistance - 24 hour    Equipment Recommendations  None recommended by PT    Recommendations for Other Services OT consult     Precautions / Restrictions Precautions Precautions: Fall Restrictions Weight Bearing Restrictions: No      Mobility  Bed Mobility Overal bed mobility: Modified Independent Bed Mobility: Rolling;Sidelying to Sit Rolling: Modified independent (Device/Increase time) Sidelying to sit: Modified independent (Device/Increase time);HOB elevated       General bed mobility comments: Pt uses hospital bed at home with [facility] elevated and rail  Transfers Overall transfer level: Needs  assistance Equipment used: Rolling walker (2 wheeled) Transfers: Sit to/from Stand Sit to Stand: Supervision         General transfer comment: pt demonstrated correct use of RW x 2 transfers; supervision for safety due to recent fall of unknown cause  Ambulation/Gait Ambulation/Gait assistance: Min guard Ambulation Distance (Feet): 60 Feet Assistive device: Rolling walker (2 wheeled) Gait Pattern/deviations: WFL(Within Functional Limits)     General Gait Details: pt did not want to leave her room due to her appearance; demonstrated excellent use of RW including negotiating in tight spaces; no dizziness or unsteadiness  Stairs            Wheelchair Mobility    Modified Rankin (Stroke Patients Only)       Balance   Sitting-balance support: No upper extremity supported;Feet supported Sitting balance-Leahy Scale: Fair (>fair not tested)     Standing balance support: No upper extremity supported Standing balance-Leahy Scale: Fair                               Pertinent Vitals/Pain Pain Assessment: No/denies pain    Home Living Family/patient expects to be discharged to:: Private residence Living Arrangements: Other relatives (sister) Available Help at Discharge: Family;Available 24 hours/day Type of Home: House Home Access: Level entry     Home Layout: One level Home Equipment: Walker - 2 wheels;Cane - single point;Walker - 4 wheels;Bedside commode Additional Comments: Sister used to run a rest home (no longer has any residents)    Prior Function Level of Independence: Independent with  assistive device(s)         Comments: has used RW for ~3 years     Hand Dominance        Extremity/Trunk Assessment   Upper Extremity Assessment: Overall WFL for tasks assessed;Defer to OT evaluation           Lower Extremity Assessment: Overall WFL for tasks assessed      Cervical / Trunk Assessment: Kyphotic  Communication   Communication:  No difficulties  Cognition Arousal/Alertness: Awake/alert Behavior During Therapy: WFL for tasks assessed/performed Overall Cognitive Status: Within Functional Limits for tasks assessed                      General Comments      Exercises        Assessment/Plan    PT Assessment Patient needs continued PT services  PT Diagnosis Difficulty walking (with falls)   PT Problem List Decreased balance;Decreased mobility  PT Treatment Interventions DME instruction;Gait training;Functional mobility training;Therapeutic activities;Balance training;Patient/family education   PT Goals (Current goals can be found in the Care Plan section) Acute Rehab PT Goals Patient Stated Goal: to  go home soon  PT Goal Formulation: With patient Time For Goal Achievement: 12/21/13 Potential to Achieve Goals: Good    Frequency Min 3X/week   Barriers to discharge        Co-evaluation               End of Session Equipment Utilized During Treatment: Gait belt Activity Tolerance: Patient tolerated treatment well Patient left: in chair;with call bell/phone within reach;with chair alarm set Nurse Communication: Mobility status (d/c planning)         Time: 1093-2355 PT Time Calculation (min): 19 min   Charges:   PT Evaluation $Initial PT Evaluation Tier I: 1 Procedure     PT G Codes:          [name] 16-Dec-2013, 9:15 AM Pager [phone]

## 2013-12-15 DIAGNOSIS — W19XXXA Unspecified fall, initial encounter: Secondary | ICD-10-CM

## 2013-12-15 LAB — PROTIME-INR
INR: 1.17 (ref 0.00–1.49)
Prothrombin Time: 15.1 seconds (ref 11.6–15.2)

## 2013-12-15 MED ORDER — ACETAMINOPHEN 325 MG PO TABS: 650.0000 mg | ORAL_TABLET | Freq: Four times a day (QID) | ORAL | Status: DC | PRN

## 2013-12-15 MED ORDER — CEPHALEXIN 500 MG PO CAPS: 500.0000 mg | ORAL_CAPSULE | Freq: Two times a day (BID) | ORAL | Status: DC

## 2013-12-15 MED ORDER — TRAMADOL HCL 50 MG PO TABS: 50.0000 mg | ORAL_TABLET | Freq: Four times a day (QID) | ORAL | Status: AC | PRN

## 2013-12-15 NOTE — Progress Notes (Signed)
Patient is discharged from room 4N13 at this time. Alert and in stable condition. IV site d/c'd. Instructions read to patient and granddaughter and understanding verbalized. Left unit via wheelchair with belongings at side.

## 2013-12-15 NOTE — Discharge Summary (Signed)
Physician Discharge Summary  [name] PIR:[id number] DOB: 31-Aug-1930 DOA: 12/12/2013  PCP: Hoyle Sauer, MD  Admit date: 12/12/2013 Discharge date: 12/15/2013  Time spent: greater than 30 minutes  Discharge Diagnoses:  Principal Problem:   Subdural hematoma Active Problems:   HTN (hypertension)   Rheumatoid arthritis   DM (diabetes mellitus), type 2, uncontrolled with complications   Rectus sheath hematoma   DVT, recurrent, lower extremity, chronic   Laceration of head   SDH (subdural hematoma)   Discharge Condition: stable  Filed Weights   12/12/13 0558  Weight: 53.978 kg (119 lb)    History of present illness:  80 year old woman with past medical history significant for hypertension, coronary artery disease, DM, COPD and a chronic recurrent lower extremity DVT for which she has been maintained on Coumadin. She had a recent hospitalization at [facility] and was discharged on October 21 during which she experienced a rectus sheath hematoma as well as had a vertebroplasty for a T9/T10 vertebral compression fracture. Her Coumadin was reinstated after discharge. Patient is examined with her sister with whom she has been living with since discharge. Patient states that this morning she went to her potty chair and fell off the potty chair hitting the left side of her head and arm. She has bruising surrounding her eye and a frontal laceration above her left eye that required stitches as well as multiple skin tears of her arm. A CT scan of the head showed a small acute left tentorial subdural hematoma without midline shift. The ED physician has contacted Dr. Mikal Plane, neurosurgery who has recommended rapid reversal of Coumadin with [facility] and transfer to [facility] for further management.  Hospital Course:  Subdural hematoma  Remains neurologically stable.  - repeat CT scan of the head stable. No intervention. Seen by neurosurgery who has signed off  Laceration of head   reapaired in ED. May remove sutures in one week. Home health RN v. PCP   Frequent falls  -Resulting in significant injury.  -TSH ok  B12 ok  -PT consulted and recommends 24 hour supervision and home PT.  Sister able to provide.  UTI  Keflex  Recurrent, chronic DVT of lower extremity  -d/c Coumadin due to fall risk -duplex negative for DVTs   Hypertension  -Norvasc, clonidine, lisinopril, Imdur   Hypokalemia  Corrected  Right shoulder pain and left rib pain  Xray without fracture  Code Status: full   Consultants:  neurosurgery Procedures:  none  Discharge Exam: Filed Vitals:   12/15/13 0956  BP: 124/52  Pulse: 79  Temp: 98.4 F (36.9 C)  Resp: 18    General: alert, oriented Cardiovascular: RRR without MGR Respiratory: CTA without WRR Neuro: nonfocal  Discharge Instructions   Diet - low sodium heart healthy    Complete by:  As directed      Discharge instructions    Complete by:  As directed   Stop warfarin. Stop sulfasalazine     Walk with assistance    Complete by:  As directed           Current Discharge Medication List    START taking these medications   Details  acetaminophen (TYLENOL) 325 MG tablet Take 2 tablets (650 mg total) by mouth every 6 (six) hours as needed for mild pain (or Fever >/= 101).    cephALEXin (KEFLEX) 500 MG capsule Take 1 capsule (500 mg total) by mouth every 12 (twelve) hours. Qty: 4 capsule, Refills: 0  CONTINUE these medications which have CHANGED   Details  traMADol (ULTRAM) 50 MG tablet Take 1 tablet (50 mg total) by mouth every 6 (six) hours as needed for moderate pain. Qty: 20 tablet, Refills: 0      CONTINUE these medications which have NOT CHANGED   Details  amLODipine (NORVASC) 5 MG tablet Take 5 mg by mouth daily.    Calcium Carbonate-Vitamin D (CALTRATE 600+D) 600-400 MG-UNIT per tablet Take 1 tablet by mouth every evening.     Camphor-Menthol-Methyl Sal (SALONPAS) 1.2-5.7-6.3 % PTCH Place 1  patch onto the skin daily as needed (For pain.).    cholecalciferol (VITAMIN D) 1000 UNITS tablet Take 2,000 Units by mouth every morning.    cloNIDine (CATAPRES) 0.1 MG tablet Take 0.1 mg by mouth 2 (two) times daily as needed (if blood pressure is over 160 systolic).    docusate sodium (COLACE) 100 MG capsule Take 100 mg by mouth 2 (two) times daily.     escitalopram (LEXAPRO) 5 MG tablet Take 5 mg by mouth every evening.     Flaxseed, Linseed, (FLAX SEED OIL PO) Take 1 tablet by mouth daily at 12 noon.     hydroxychloroquine (PLAQUENIL) 200 MG tablet Take 200 mg by mouth 2 (two) times daily.     isosorbide mononitrate (IMDUR) 30 MG 24 hr tablet Take 30 mg by mouth every morning.    lactose free nutrition (BOOST PLUS) LIQD Take 237 mLs by mouth 2 (two) times daily between meals as needed (As requested by pt or family). Qty: 90 Can, Refills: 0    Linaclotide (LINZESS) 145 MCG CAPS capsule Take 290 mcg by mouth every morning.     lisinopril (PRINIVIL,ZESTRIL) 10 MG tablet Take 1 tablet (10 mg total) by mouth daily. Qty: 30 tablet, Refills: 0    Multiple Vitamin (MULTIVITAMIN WITH MINERALS) TABS tablet Take 1 tablet by mouth daily at 12 noon.    omeprazole (PRILOSEC OTC) 20 MG tablet Take 20 mg by mouth every morning.     polyethylene glycol (MIRALAX / GLYCOLAX) packet Take 17 g by mouth daily. Qty: 14 each, Refills: 0    promethazine (PHENERGAN) 12.5 MG tablet Take 12.5 mg by mouth every 6 (six) hours as needed for nausea (Patient takes with Tramadol).      STOP taking these medications     magnesium citrate SOLN      sulfaSALAzine (AZULFIDINE) 500 MG tablet      warfarin (COUMADIN) 5 MG tablet        Allergies  Allergen Reactions  . Codeine Nausea And Vomiting, Swelling and Rash  . Hydrocodone Nausea And Vomiting, Swelling and Rash    "mouth peeled"  . Morphine And Related Other (See Comments)    hallucinations  . Other Nausea And Vomiting    Most pain meds! other  than tramadol. Tylenol, Toradol, and Robaxin are ok.    . Penicillins Hives  . Demerol [Meperidine] Nausea And Vomiting  . Propoxyphene Hives and Nausea And Vomiting  . Statins Other (See Comments)    INTOLERANT OF STATINS - messes up her legs and has muscle aches  . Tape Other (See Comments)    Needs paper tape - skin very fragile   Follow-up Information   Follow up with Hoyle Sauer, MD In 2 weeks.   Specialty:  Internal Medicine   Contact information:   [address] [place] [address] [phone]        The results of significant diagnostics from this  hospitalization (including imaging, microbiology, ancillary and laboratory) are listed below for reference.    Significant Diagnostic Studies: Dg Ribs Unilateral Left  12/14/2013   CLINICAL DATA:  80 year old female with history of fall 2 days ago with injury to the right side. Patient fell onto 8 blood for. Complaining of pain on the right side of the chest below the diaphragm, as well as right-sided shoulder pain.  EXAM: LEFT RIBS - 2 VIEW  COMPARISON:  No priors.  FINDINGS: There is irregularity of several left ribs, including the lateral aspects of the left fourth, eighth and ninth ribs, with callus formation at each of these levels. No pneumothorax. Multiple vertebral body compression fractures and post procedural changes of vertebroplasty are noted in the mid thoracic spine. Extensive atherosclerosis. Coarse interstitial markings throughout the visualized lungs which appear to be chronic and are similar to prior studies.  IMPRESSION: 1. Irregularity of the lateral left fourth, eighth and ninth ribs, with associated callus formation, compatible with old healed nondisplaced rib fractures. No acute displaced left-sided rib fractures are noted. These findings are similar in retrospect compared to prior study 08/20/2013.   Electronically Signed   By: Trudie Reed M.D.   On: 12/14/2013 11:21   Dg Shoulder  Right  12/14/2013   CLINICAL DATA:  Larey Seat at home 2 days ago.  Pain.  EXAM: RIGHT SHOULDER - 2+ VIEW  COMPARISON:  Right shoulder radiographs 08/20/2013  FINDINGS: Right shoulder arthroplasty. Arthroplasty appears located. No evidence of subluxation or dislocation. Chronic degenerative changes of the glenoid appears stable. Acromioclavicular joint is aligned. Degenerative changes of the acromion and acromioclavicular joint appear stable. No acute fracture identified.  Remote appearing fractures of several right-sided ribs noted.  IMPRESSION: Stable right shoulder arthroplasty. No acute bony abnormality. Chronic degenerative changes about the shoulder joint.  Remote appearing right-sided rib fractures.   Electronically Signed   By: Britta Mccreedy M.D.   On: 12/14/2013 11:15   Ct Head Wo Contrast  12/13/2013   CLINICAL DATA:  Subdural hematoma follow-up evaluation  EXAM: CT HEAD WITHOUT CONTRAST  TECHNIQUE: Contiguous axial images were obtained from the base of the skull through the vertex without intravenous contrast.  COMPARISON:  12/12/2013  FINDINGS: Diffuse atrophy and white matter disease stable. 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 Small focus of hyperattenuation in the left tentorium measuring about 2 cm in maximal dimension, stable. No other evidence of hemorrhage or extra-axial fluid. Left scalp laceration again identified.  IMPRESSION: Stable small left subdural hematoma.   Electronically Signed   By: Esperanza Heir M.D.   On: 12/13/2013 08:07   Ct Head Wo Contrast  12/12/2013   CLINICAL DATA:  Head injury after fall in bedroom.  EXAM: CT HEAD WITHOUT CONTRAST  CT MAXILLOFACIAL WITHOUT CONTRAST  CT CERVICAL SPINE WITHOUT CONTRAST  TECHNIQUE: Multidetector CT imaging of the head, cervical spine, and maxillofacial structures were performed using the standard protocol without intravenous contrast. Multiplanar CT image reconstructions of the cervical spine and  maxillofacial structures were also generated.  COMPARISON:  CT scan of August 20, 2013.  FINDINGS: CT HEAD FINDINGS  Bony calvarium appears intact. Left frontal scalp laceration is noted. Mild diffuse cortical atrophy is noted. Mild chronic ischemic white matter disease is noted. 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 There is noted prominent increased density along the left tentorium which is more prominent compared to prior exams, and concerning for possible small subdural hematoma. No evidence of mass lesion or acute  infarction is noted.  CT MAXILLOFACIAL FINDINGS  No fracture or other bony abnormality is noted. Paranasal sinuses appear normal. Pterygoid plates appear normal. Globes and orbits appear normal. Ostiomeatal complexes are widely patent.  CT CERVICAL SPINE FINDINGS  Visualized portions of upper lung fields appear normal. No definite fracture or significant spondylolisthesis is noted. Moderate degenerative disc disease is noted at C6-7 with anterior and posterior osteophyte formation hypertrophy of posterior facet joints is noted at C7-T1 secondary to degenerative joint disease.  IMPRESSION: Left frontal scalp laceration. Mild diffuse cortical atrophy. Mild chronic ischemic white matter disease.  Small acute left tentorial subdural hematoma.  No significant abnormality seen in the maxillofacial region.  Degenerative changes are again noted in the cervical spine. No fracture or significant spondylolisthesis is noted.  Critical Value/emergent results were called by telephone at the time of interpretation on 12/12/2013 at 7:23 am to Dr. Rolland Porter , who verbally acknowledged these results.   Electronically Signed   By: Roque Lias M.D.   On: 12/12/2013 07:26   Ct Angio Chest Pe W/cm &/or Wo Cm  12/02/2013   CLINICAL DATA:  Abdominal pain. Left upper rib cage pain. Initial encounter.  EXAM: CT ANGIOGRAPHY CHEST  CT ABDOMEN AND PELVIS WITH CONTRAST  TECHNIQUE: Multidetector CT imaging of  the chest was performed using the standard protocol during bolus administration of intravenous contrast. Multiplanar CT image reconstructions and MIPs were obtained to evaluate the vascular anatomy. Multidetector CT imaging of the abdomen and pelvis was performed using the standard protocol during bolus administration of intravenous contrast.  CONTRAST:  OMNIPAQUE IOHEXOL 350 MG/ML SOLN  COMPARISON:  None.  FINDINGS: CTA CHEST FINDINGS  THORACIC INLET/BODY WALL:  15 mm thyroid nodule in the inferior right lobe. This is likely incidental based on size and patient age.  No acute findings.  MEDIASTINUM:  Normal heart size. No pericardial effusion. Diffuse atherosclerosis, including the coronary arteries. Status post coronary stenting. Negative for acute aortic finding (as permitted by non opacification of the mid thoracic aorta). Negative for pulmonary embolism. Moderate sliding-type hiatal hernia without obstruction.  LUNG WINDOWS:  Subpleural reticulation which is likely chronic. There is no edema, pneumonia, effusion, or pneumothorax. No suspicious pulmonary nodules.  OSSEOUS:  There is a T9 compression fracture with height loss greater than 75%. Although an acute fracture line is not visible, there is surrounding relatively low-density perispinal fat infiltration - likely edema or hemorrhage. No definite bowing of the fracture, enhancing soft tissue, or sclerosis suggestive of pathologic fracture. No subluxation.  Remote T7 compression fracture status post bone augmentation.  Remote right-sided rib fractures.  Right glenohumeral arthroplasty with anterior subluxation of humeral prosthesis, commonly encounter.  CT ABDOMEN and PELVIS FINDINGS  BODY WALL: Unremarkable.  ABDOMEN/PELVIS:  Liver: No focal abnormality.  Biliary: Cholecystectomy.  Pancreas: Unremarkable.  Spleen: Granulomatous changes. Heterogeneous enhancement of the inferior spleen resolves on delayed imaging. No splenic mass.  Adrenals:  Unremarkable.  Kidneys and ureters: No hydronephrosis or stone.  Bladder: Moderately distended.  Reproductive: Low pelvic floor. Status post hysterectomy. No adnexal mass.  Bowel: Moderate sliding-type hiatal hernia. No bowel obstruction. No pericecal inflammation.  Retroperitoneum: No mass or adenopathy.  Peritoneum: No ascites or pneumoperitoneum.  Vascular: Extensive atherosclerosis, status post aorto bi femoral bypass. The graft is patent. No acute vascular findings.  OSSEOUS: Left total hip arthroplasty. Remote proximal right femur ORIF. No adverse findings. Remote insufficiency fractures of the right sacral ala and inferior pubic ramus. No acute fracture identified. L4-5  laminotomy.  Review of the MIP images confirms the above findings.  IMPRESSION: 1. Recent T9 compression fracture with severe height loss but no bony retropulsion. 2. Negative for pulmonary embolism. 3. No acute intra-abdominal findings.   Electronically Signed   By: Tiburcio Pea M.D.   On: 12/02/2013 01:23   Ct Cervical Spine Wo Contrast  12/12/2013   CLINICAL DATA:  Head injury after fall in bedroom.  EXAM: CT HEAD WITHOUT CONTRAST  CT MAXILLOFACIAL WITHOUT CONTRAST  CT CERVICAL SPINE WITHOUT CONTRAST  TECHNIQUE: Multidetector CT imaging of the head, cervical spine, and maxillofacial structures were performed using the standard protocol without intravenous contrast. Multiplanar CT image reconstructions of the cervical spine and maxillofacial structures were also generated.  COMPARISON:  CT scan of August 20, 2013.  FINDINGS: CT HEAD FINDINGS  Bony calvarium appears intact. Left frontal scalp laceration is noted. Mild diffuse cortical atrophy is noted. Mild chronic ischemic white matter disease is noted. 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 There is noted prominent increased density along the left tentorium which is more prominent compared to prior exams, and concerning for possible small subdural hematoma. No evidence  of mass lesion or acute infarction is noted.  CT MAXILLOFACIAL FINDINGS  No fracture or other bony abnormality is noted. Paranasal sinuses appear normal. Pterygoid plates appear normal. Globes and orbits appear normal. Ostiomeatal complexes are widely patent.  CT CERVICAL SPINE FINDINGS  Visualized portions of upper lung fields appear normal. No definite fracture or significant spondylolisthesis is noted. Moderate degenerative disc disease is noted at C6-7 with anterior and posterior osteophyte formation hypertrophy of posterior facet joints is noted at C7-T1 secondary to degenerative joint disease.  IMPRESSION: Left frontal scalp laceration. Mild diffuse cortical atrophy. Mild chronic ischemic white matter disease.  Small acute left tentorial subdural hematoma.  No significant abnormality seen in the maxillofacial region.  Degenerative changes are again noted in the cervical spine. No fracture or significant spondylolisthesis is noted.  Critical Value/emergent results were called by telephone at the time of interpretation on 12/12/2013 at 7:23 am to Dr. Rolland Porter , who verbally acknowledged these results.   Electronically Signed   By: Roque Lias M.D.   On: 12/12/2013 07:26   Mr Thoracic Spine Wo Contrast  12/05/2013   CLINICAL DATA:  Fall 3 weeks ago. T9 fracture. Subsequent encounter  EXAM: MRI THORACIC SPINE WITHOUT CONTRAST  TECHNIQUE: Multiplanar, multisequence MR imaging of the thoracic spine was performed. No intravenous contrast was administered.  COMPARISON:  CT of the chest 12/02/2013.  FINDINGS: Normal signal is present throughout the thoracic spinal cord to the conus medullaris which terminates at L1-2, within normal limits.  Vertebral augmentation is evident at T7. There is a new burst fracture scratch the there is a new compression fracture at T9 with diffuse marrow edema. There slight retropulsion of bone which effaces the ventral CSF at this level. A fracture is noted along the anterior  inferior endplate of T8 as well. A hemangioma is present posteriorly within the T8 vertebral body. An inferior endplate fracture at T6 is chronic.  Facet hypertrophy and exaggerated kyphosis lead to mild central and bilateral foraminal narrowing at T8-9, right greater than left.  T9-10: Retropulsed bone and facet hypertrophy results in moderate right and mild left foraminal stenosis.  T10-11: A shallow left paramedian disc protrusion is present without significant stenosis.  Slight retrolisthesis and uncovering of a broad-based protrusion is present at L1-2. Mild subarticular stenosis is evident.  IMPRESSION: 1.  New osteoporotic compression fracture at T9 with slight retropulsion of bone resulting and mild central canal stenosis. There is diffuse edema throughout the vertebral body. 2. Acute/subacute anterior inferior endplate compression fracture at T8. 3. Mild central and bilateral foraminal narrowing at T8-9. 4. Moderate right and mild left foraminal stenosis at T9-10. 5. Shallow left paramedian disc protrusion at T10-11 without significant stenosis. 6. Uncovering broad-based disc protrusion with mild subarticular narrowing bilaterally at L1-2.   Electronically Signed   By: Gennette Pac M.D.   On: 12/05/2013 15:45   Ct Abdomen Pelvis W Contrast  12/02/2013   CLINICAL DATA:  Abdominal pain. Left upper rib cage pain. Initial encounter.  EXAM: CT ANGIOGRAPHY CHEST  CT ABDOMEN AND PELVIS WITH CONTRAST  TECHNIQUE: Multidetector CT imaging of the chest was performed using the standard protocol during bolus administration of intravenous contrast. Multiplanar CT image reconstructions and MIPs were obtained to evaluate the vascular anatomy. Multidetector CT imaging of the abdomen and pelvis was performed using the standard protocol during bolus administration of intravenous contrast.  CONTRAST:  OMNIPAQUE IOHEXOL 350 MG/ML SOLN  COMPARISON:  None.  FINDINGS: CTA CHEST FINDINGS  THORACIC INLET/BODY WALL:  15 mm  thyroid nodule in the inferior right lobe. This is likely incidental based on size and patient age.  No acute findings.  MEDIASTINUM:  Normal heart size. No pericardial effusion. Diffuse atherosclerosis, including the coronary arteries. Status post coronary stenting. Negative for acute aortic finding (as permitted by non opacification of the mid thoracic aorta). Negative for pulmonary embolism. Moderate sliding-type hiatal hernia without obstruction.  LUNG WINDOWS:  Subpleural reticulation which is likely chronic. There is no edema, pneumonia, effusion, or pneumothorax. No suspicious pulmonary nodules.  OSSEOUS:  There is a T9 compression fracture with height loss greater than 75%. Although an acute fracture line is not visible, there is surrounding relatively low-density perispinal fat infiltration - likely edema or hemorrhage. No definite bowing of the fracture, enhancing soft tissue, or sclerosis suggestive of pathologic fracture. No subluxation.  Remote T7 compression fracture status post bone augmentation.  Remote right-sided rib fractures.  Right glenohumeral arthroplasty with anterior subluxation of humeral prosthesis, commonly encounter.  CT ABDOMEN and PELVIS FINDINGS  BODY WALL: Unremarkable.  ABDOMEN/PELVIS:  Liver: No focal abnormality.  Biliary: Cholecystectomy.  Pancreas: Unremarkable.  Spleen: Granulomatous changes. Heterogeneous enhancement of the inferior spleen resolves on delayed imaging. No splenic mass.  Adrenals: Unremarkable.  Kidneys and ureters: No hydronephrosis or stone.  Bladder: Moderately distended.  Reproductive: Low pelvic floor. Status post hysterectomy. No adnexal mass.  Bowel: Moderate sliding-type hiatal hernia. No bowel obstruction. No pericecal inflammation.  Retroperitoneum: No mass or adenopathy.  Peritoneum: No ascites or pneumoperitoneum.  Vascular: Extensive atherosclerosis, status post aorto bi femoral bypass. The graft is patent. No acute vascular findings.  OSSEOUS: Left  total hip arthroplasty. Remote proximal right femur ORIF. No adverse findings. Remote insufficiency fractures of the right sacral ala and inferior pubic ramus. No acute fracture identified. L4-5 laminotomy.  Review of the MIP images confirms the above findings.  IMPRESSION: 1. Recent T9 compression fracture with severe height loss but no bony retropulsion. 2. Negative for pulmonary embolism. 3. No acute intra-abdominal findings.   Electronically Signed   By: Tiburcio Pea M.D.   On: 12/02/2013 01:23   Ir Kypho Vertebral Thoracic Augmentation  12/10/2013   CLINICAL DATA:  Severe mid thoracic pain post fall. CT and MR demonstrate subacute unhealed compression fracture deformities, mild at T8, moderately severe T9. Remote  kyphoplasty at T7.  EXAM: KYPHOPLASTY AT THORACIC T8 AND T9:  TECHNIQUE: The procedure, risks (including but not limited to bleeding, infection, organ damage), benefits, and alternatives were explained to the patient. Questions regarding the procedure were encouraged and answered. The patient understands and consents to the procedure.  The patient was placed prone on the fluoroscopic table. The skin overlying the mid thoracic region was then prepped and draped in the usual sterile fashion. Maximal barrier sterile technique was utilized including caps, mask, sterile gowns, sterile gloves, sterile drape, hand hygiene and skin antiseptic.  Intravenous Fentanyl and Versed were administered as conscious sedation during continuous cardiorespiratory monitoring by the radiology RN, with a total moderate sedation time of75 minutes.  As antibiotic prophylaxis, vancomycin 1 g was ordered pre-procedure and administered intravenously within !one hour! of incision. The appropriate levels were localized under fluoroscopy.  The left pedicle at T9 was then infiltrated with 1% lidocaine followed by the advancement of a Kyphon trocar needle through the left pedicle into the posterior one-third. The trocar was  removed and the osteo drill was advanced to the anterior third of the vertebral body. The osteo drill was retracted. Through the working cannula, a Kyphon inflatable bone tamp 15 x 3 was advanced and positioned with the distal marker 5 mm from the anterior aspect of the cortex. Crossing of the midline was not seen on the AP projection. At this time, the balloon was expanded using contrast via a Kyphon inflation syringe device via micro tubing.  In similar fashion, the right pedicle was infiltrated with 1% lidocaine followed by the advancement of a second Kyphon trocar needle through the right pedicle into the posterior third of the vertebral body. The osteo drill was coaxially advanced to the anterior right third. The osteo drill was exchanged for a Kyphon inflatable bone tamp 15x 3, advanced to the 5 mm of the anterior aspect of the cortex. The balloon was then expanded using contrast as above.  In similar fashion, the left pedicle at T8 was then infiltrated with 1% lidocaine followed by the advancement of a Kyphon trocar needle through the left pedicle into the posterior one-third. The trocar was removed and the osteo drill was advanced to the anterior third of the vertebral body. The osteo drill was retracted. Through the working cannula, a Kyphon inflatable bone tamp 15 x 3 was advanced and positioned with the distal marker 5 mm from the anterior aspect of the cortex. Crossing of the midline was not seen on the AP projection. At this time, the balloon was expanded using contrast via a Kyphon inflation syringe device via micro tubing.  In similar fashion, the right T8 pedicle was infiltrated with 1% lidocaine followed by the advancement of a second Kyphon trocar needle through the right pedicle into the posterior third of the vertebral body. The osteo drill was coaxially advanced to the anterior right third. The osteo drill was exchanged for a Kyphon inflatable bone tamp 15x 3, advanced to the 5 mm of the  anterior aspect of the cortex. The balloon was then expanded using contrast as above.  Inflations were continued until there was near apposition across the midline and with the superior and the inferior end plates.  At this time, methylmethacrylate mixture was reconstituted in the Kyphon bone mixing device system. This was then loaded into the delivery mechanism, attached to Kyphon bone fillers.  The balloons were deflated and removed followed by the instillation of methylmethacrylate mixture with excellent filling in the AP  and lateral projections. No extravasation was noted in the disk spaces or posteriorly into the spinal canal. No significant epidural venous contamination was seen.  The patient tolerated the procedure well. There were no acute complications. The working cannulae and the bone filler were then retrieved and removed.  IMPRESSION: 1. Status post vertebral body augmentation using balloon kyphoplasty at thoracic T8 and T9 as described without event. 2. Per CMS PQRS reporting requirements (PQRS Measure 24): Given the patient's age of greater than 50 and the fracture site (hip, distal radius, or spine), the patient should be tested for osteoporosis using DXA, and the appropriate treatment considered based on the DXA results.   Electronically Signed   By: Oley Balm M.D.   On: 12/10/2013 09:46   Ir Kypho Vertebral Add'l Augmentation  12/10/2013   CLINICAL DATA:  Severe mid thoracic pain post fall. CT and MR demonstrate subacute unhealed compression fracture deformities, mild at T8, moderately severe T9. Remote kyphoplasty at T7.  EXAM: KYPHOPLASTY AT THORACIC T8 AND T9:  TECHNIQUE: The procedure, risks (including but not limited to bleeding, infection, organ damage), benefits, and alternatives were explained to the patient. Questions regarding the procedure were encouraged and answered. The patient understands and consents to the procedure.  The patient was placed prone on the fluoroscopic  table. The skin overlying the mid thoracic region was then prepped and draped in the usual sterile fashion. Maximal barrier sterile technique was utilized including caps, mask, sterile gowns, sterile gloves, sterile drape, hand hygiene and skin antiseptic.  Intravenous Fentanyl and Versed were administered as conscious sedation during continuous cardiorespiratory monitoring by the radiology RN, with a total moderate sedation time of75 minutes.  As antibiotic prophylaxis, vancomycin 1 g was ordered pre-procedure and administered intravenously within !one hour! of incision. The appropriate levels were localized under fluoroscopy.  The left pedicle at T9 was then infiltrated with 1% lidocaine followed by the advancement of a Kyphon trocar needle through the left pedicle into the posterior one-third. The trocar was removed and the osteo drill was advanced to the anterior third of the vertebral body. The osteo drill was retracted. Through the working cannula, a Kyphon inflatable bone tamp 15 x 3 was advanced and positioned with the distal marker 5 mm from the anterior aspect of the cortex. Crossing of the midline was not seen on the AP projection. At this time, the balloon was expanded using contrast via a Kyphon inflation syringe device via micro tubing.  In similar fashion, the right pedicle was infiltrated with 1% lidocaine followed by the advancement of a second Kyphon trocar needle through the right pedicle into the posterior third of the vertebral body. The osteo drill was coaxially advanced to the anterior right third. The osteo drill was exchanged for a Kyphon inflatable bone tamp 15x 3, advanced to the 5 mm of the anterior aspect of the cortex. The balloon was then expanded using contrast as above.  In similar fashion, the left pedicle at T8 was then infiltrated with 1% lidocaine followed by the advancement of a Kyphon trocar needle through the left pedicle into the posterior one-third. The trocar was removed  and the osteo drill was advanced to the anterior third of the vertebral body. The osteo drill was retracted. Through the working cannula, a Kyphon inflatable bone tamp 15 x 3 was advanced and positioned with the distal marker 5 mm from the anterior aspect of the cortex. Crossing of the midline was not seen on the AP projection. At this  time, the balloon was expanded using contrast via a Kyphon inflation syringe device via micro tubing.  In similar fashion, the right T8 pedicle was infiltrated with 1% lidocaine followed by the advancement of a second Kyphon trocar needle through the right pedicle into the posterior third of the vertebral body. The osteo drill was coaxially advanced to the anterior right third. The osteo drill was exchanged for a Kyphon inflatable bone tamp 15x 3, advanced to the 5 mm of the anterior aspect of the cortex. The balloon was then expanded using contrast as above.  Inflations were continued until there was near apposition across the midline and with the superior and the inferior end plates.  At this time, methylmethacrylate mixture was reconstituted in the Kyphon bone mixing device system. This was then loaded into the delivery mechanism, attached to Kyphon bone fillers.  The balloons were deflated and removed followed by the instillation of methylmethacrylate mixture with excellent filling in the AP and lateral projections. No extravasation was noted in the disk spaces or posteriorly into the spinal canal. No significant epidural venous contamination was seen.  The patient tolerated the procedure well. There were no acute complications. The working cannulae and the bone filler were then retrieved and removed.  IMPRESSION: 1. Status post vertebral body augmentation using balloon kyphoplasty at thoracic T8 and T9 as described without event. 2. Per CMS PQRS reporting requirements (PQRS Measure 24): Given the patient's age of greater than 50 and the fracture site (hip, distal radius, or  spine), the patient should be tested for osteoporosis using DXA, and the appropriate treatment considered based on the DXA results.   Electronically Signed   By: Oley Balm M.D.   On: 12/10/2013 09:46   Ct Maxillofacial Wo Cm  12/12/2013   CLINICAL DATA:  Head injury after fall in bedroom.  EXAM: CT HEAD WITHOUT CONTRAST  CT MAXILLOFACIAL WITHOUT CONTRAST  CT CERVICAL SPINE WITHOUT CONTRAST  TECHNIQUE: Multidetector CT imaging of the head, cervical spine, and maxillofacial structures were performed using the standard protocol without intravenous contrast. Multiplanar CT image reconstructions of the cervical spine and maxillofacial structures were also generated.  COMPARISON:  CT scan of August 20, 2013.  FINDINGS: CT HEAD FINDINGS  Bony calvarium appears intact. Left frontal scalp laceration is noted. Mild diffuse cortical atrophy is noted. Mild chronic ischemic white matter disease is noted. 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 There is noted prominent increased density along the left tentorium which is more prominent compared to prior exams, and concerning for possible small subdural hematoma. No evidence of mass lesion or acute infarction is noted.  CT MAXILLOFACIAL FINDINGS  No fracture or other bony abnormality is noted. Paranasal sinuses appear normal. Pterygoid plates appear normal. Globes and orbits appear normal. Ostiomeatal complexes are widely patent.  CT CERVICAL SPINE FINDINGS  Visualized portions of upper lung fields appear normal. No definite fracture or significant spondylolisthesis is noted. Moderate degenerative disc disease is noted at C6-7 with anterior and posterior osteophyte formation hypertrophy of posterior facet joints is noted at C7-T1 secondary to degenerative joint disease.  IMPRESSION: Left frontal scalp laceration. Mild diffuse cortical atrophy. Mild chronic ischemic white matter disease.  Small acute left tentorial subdural hematoma.  No significant  abnormality seen in the maxillofacial region.  Degenerative changes are again noted in the cervical spine. No fracture or significant spondylolisthesis is noted.  Critical Value/emergent results were called by telephone at the time of interpretation on 12/12/2013 at 7:23 am to Dr.  MARK JAMES , who verbally acknowledged these results.   Electronically Signed   By: Roque Lias M.D.   On: 12/12/2013 07:26    Microbiology: Recent Results (from the past 240 hour(s))  MRSA PCR SCREENING     Status: None   Collection Time    12/12/13 10:45 PM      Result Value Ref Range Status   MRSA by PCR NEGATIVE  NEGATIVE Final   Comment:            The GeneXpert MRSA Assay (FDA     approved for NASAL specimens     only), is one component of a     comprehensive MRSA colonization     surveillance program. It is not     intended to diagnose MRSA     infection nor to guide or     monitor treatment for     MRSA infections.     Labs: Basic Metabolic Panel:  Recent Labs Lab 12/09/13 0443 12/12/13 0738 12/13/13 0217 12/14/13 0450  NA 140 140 141 139  K 3.7 3.4* 3.2* 4.0  CL 105 101 102 102  CO2 23 27 22 25   GLUCOSE 104* 125* 87 90  BUN 6 5* 6 12  CREATININE 0.55 0.48* 0.42* 0.59  CALCIUM 8.7 9.0 9.1 9.1   Liver Function Tests: No results found for this basename: AST, ALT, ALKPHOS, BILITOT, PROT, ALBUMIN,  in the last 168 hours No results found for this basename: LIPASE, AMYLASE,  in the last 168 hours No results found for this basename: AMMONIA,  in the last 168 hours CBC:  Recent Labs Lab 12/09/13 0443 12/10/13 0408 12/12/13 0738 12/13/13 0217 12/14/13 0450  WBC 4.8 6.7 9.0 6.6 5.6  NEUTROABS  --   --  7.3  --   --   HGB 9.7* 9.6* 10.4* 11.2* 10.0*  HCT 29.2* 29.3* 31.5* 33.6* 31.1*  MCV 89.3 90.4 90.3 88.2 89.6  PLT 198 175 186 216 218   Cardiac Enzymes: No results found for this basename: CKTOTAL, CKMB, CKMBINDEX, TROPONINI,  in the last 168 hours BNP: BNP (last 3 results) No  results found for this basename: PROBNP,  in the last 8760 hours CBG:  Recent Labs Lab 12/13/13 0848 12/13/13 1218 12/13/13 1639 12/13/13 2204 12/14/13 0657  GLUCAP 146* 101* 94 136* 87   Urinalysis    Component Value Date/Time   COLORURINE AMBER* 12/14/2013 0133   APPEARANCEUR TURBID* 12/14/2013 0133   LABSPEC 1.023 12/14/2013 0133   PHURINE 6.0 12/14/2013 0133   GLUCOSEU NEGATIVE 12/14/2013 0133   HGBUR NEGATIVE 12/14/2013 0133   BILIRUBINUR MODERATE* 12/14/2013 0133   KETONESUR 15* 12/14/2013 0133   PROTEINUR 30* 12/14/2013 0133   UROBILINOGEN 1.0 12/14/2013 0133   NITRITE NEGATIVE 12/14/2013 0133   LEUKOCYTESUR SMALL* 12/14/2013 0133      Signed:  Tobiah Celestine L  [facility] 12/15/2013, 10:50 AM

## 2013-12-15 NOTE — Progress Notes (Signed)
PT Cancellation Note  Patient Details Name: GAYATRI TEASDALE MRN: [id number] DOB: 01-03-1931   Cancelled Treatment:    Reason Eval/Treat Not Completed:  Pt reports she is upset/aggravated re: loss of independence here in hospital and asked to be allowed to rest. Agreed to walk later today.   Haydan Wedig 12/15/2013, 10:07 AM Pager [phone]

## 2013-12-15 NOTE — Progress Notes (Signed)
Patient stated that she lives with her sister and is active with [facility] as prior to admission; Attending MD please order to resume HHC as prior to admission for HHPT/OT; Alexis Goodell [phone]

## 2013-12-15 NOTE — Clinical Social Work Note (Signed)
Patient requesting Home Health. RNCM notified. Clinical Social Worker will sign off for now as social work intervention is no longer needed. Please consult [place] again if new need arises.  Derenda Fennel, MSW, LCSWA [phone] 12/15/2013 12:35 PM

## 2013-12-16 ENCOUNTER — Ambulatory Visit: Payer: Medicare Other | Admitting: Cardiology

## 2014-01-21 ENCOUNTER — Other Ambulatory Visit: Payer: Self-pay | Admitting: Internal Medicine

## 2014-01-21 ENCOUNTER — Ambulatory Visit
Admission: RE | Admit: 2014-01-21 | Discharge: 2014-01-21 | Disposition: A | Discharge status: Home / Self Care | Payer: Medicare Other | Source: Ambulatory Visit | Attending: Internal Medicine | Admitting: Internal Medicine

## 2014-01-21 DIAGNOSIS — R0602 Shortness of breath: Secondary | ICD-10-CM

## 2014-01-21 MED ORDER — IOHEXOL 350 MG/ML SOLN
100.0000 mL | Freq: Once | INTRAVENOUS | Status: AC | PRN
  Administered 2014-01-21: 100 mL via INTRAVENOUS

## 2014-03-04 DIAGNOSIS — M069 Rheumatoid arthritis, unspecified: Secondary | ICD-10-CM | POA: Diagnosis not present

## 2014-03-04 DIAGNOSIS — Z008 Encounter for other general examination: Secondary | ICD-10-CM | POA: Diagnosis not present

## 2014-03-04 DIAGNOSIS — I6200 Nontraumatic subdural hemorrhage, unspecified: Secondary | ICD-10-CM | POA: Diagnosis not present

## 2014-03-04 DIAGNOSIS — Z1389 Encounter for screening for other disorder: Secondary | ICD-10-CM | POA: Diagnosis not present

## 2014-03-04 DIAGNOSIS — J449 Chronic obstructive pulmonary disease, unspecified: Secondary | ICD-10-CM | POA: Diagnosis not present

## 2014-03-04 DIAGNOSIS — I2510 Atherosclerotic heart disease of native coronary artery without angina pectoris: Secondary | ICD-10-CM | POA: Diagnosis not present

## 2014-03-04 DIAGNOSIS — E1151 Type 2 diabetes mellitus with diabetic peripheral angiopathy without gangrene: Secondary | ICD-10-CM | POA: Diagnosis not present

## 2014-03-04 DIAGNOSIS — K2270 Barrett's esophagus without dysplasia: Secondary | ICD-10-CM | POA: Diagnosis not present

## 2014-03-04 DIAGNOSIS — I10 Essential (primary) hypertension: Secondary | ICD-10-CM | POA: Diagnosis not present

## 2014-03-05 ENCOUNTER — Encounter: Payer: Self-pay | Admitting: Cardiology

## 2014-03-05 ENCOUNTER — Telehealth: Payer: Self-pay | Admitting: Cardiology

## 2014-03-05 NOTE — Telephone Encounter (Signed)
Received records from [facility] (Dr Chilton Greathouse) for appointment with Dr Herbie Baltimore on 03/18/14.  Records given to N. Hines (medical records) for Dr Erich Montane schedule on 03/18/14. lp

## 2014-03-10 DIAGNOSIS — M810 Age-related osteoporosis without current pathological fracture: Secondary | ICD-10-CM | POA: Diagnosis not present

## 2014-03-10 DIAGNOSIS — M25561 Pain in right knee: Secondary | ICD-10-CM | POA: Diagnosis not present

## 2014-03-10 DIAGNOSIS — M79641 Pain in right hand: Secondary | ICD-10-CM | POA: Diagnosis not present

## 2014-03-10 DIAGNOSIS — M069 Rheumatoid arthritis, unspecified: Secondary | ICD-10-CM | POA: Diagnosis not present

## 2014-03-17 ENCOUNTER — Other Ambulatory Visit: Payer: Self-pay | Admitting: Cardiology

## 2014-03-17 DIAGNOSIS — Z1212 Encounter for screening for malignant neoplasm of rectum: Secondary | ICD-10-CM | POA: Diagnosis not present

## 2014-03-17 MED ORDER — ISOSORBIDE MONONITRATE ER 30 MG PO TB24: 30.0000 mg | ORAL_TABLET | Freq: Every morning | ORAL | Status: DC

## 2014-03-17 NOTE — Telephone Encounter (Signed)
Pt is not going to be acle to come tomorrow because of the weather. Pt need a new prescription for her Imdur.Please call this to [facility]-[phone]

## 2014-03-17 NOTE — Telephone Encounter (Signed)
Refill submitted to pharmacy of choice.

## 2014-03-18 ENCOUNTER — Ambulatory Visit: Payer: Medicare Other | Admitting: Cardiology

## 2014-03-27 ENCOUNTER — Encounter (HOSPITAL_COMMUNITY): Payer: Self-pay | Admitting: *Deleted

## 2014-03-27 ENCOUNTER — Ambulatory Visit (INDEPENDENT_AMBULATORY_CARE_PROVIDER_SITE_OTHER): Payer: Medicare Other | Admitting: Cardiology

## 2014-03-27 VITALS — BP 172/66 | HR 89 | Ht 61.0 in | Wt 111.0 lb

## 2014-03-27 DIAGNOSIS — R0609 Other forms of dyspnea: Secondary | ICD-10-CM | POA: Diagnosis not present

## 2014-03-27 DIAGNOSIS — I82511 Chronic embolism and thrombosis of right femoral vein: Secondary | ICD-10-CM

## 2014-03-27 DIAGNOSIS — R0601 Orthopnea: Secondary | ICD-10-CM

## 2014-03-27 DIAGNOSIS — I739 Peripheral vascular disease, unspecified: Secondary | ICD-10-CM

## 2014-03-27 DIAGNOSIS — I2510 Atherosclerotic heart disease of native coronary artery without angina pectoris: Secondary | ICD-10-CM | POA: Diagnosis not present

## 2014-03-27 DIAGNOSIS — E785 Hyperlipidemia, unspecified: Secondary | ICD-10-CM | POA: Diagnosis not present

## 2014-03-27 DIAGNOSIS — I10 Essential (primary) hypertension: Secondary | ICD-10-CM | POA: Diagnosis not present

## 2014-03-27 DIAGNOSIS — Z9861 Coronary angioplasty status: Secondary | ICD-10-CM

## 2014-03-27 DIAGNOSIS — I700 Atherosclerosis of aorta: Secondary | ICD-10-CM

## 2014-03-27 DIAGNOSIS — I714 Abdominal aortic aneurysm, without rupture, unspecified: Secondary | ICD-10-CM

## 2014-03-27 DIAGNOSIS — J432 Centrilobular emphysema: Secondary | ICD-10-CM

## 2014-03-27 NOTE — Patient Instructions (Signed)
Your physician has requested that you have a lexiscan myoview. For further information please visit [email or web address] Please follow instruction sheet, as given.  Your physician has requested that you have an echocardiogram. Echocardiography is a painless test that uses sound waves to create images of your heart. It provides your doctor with information about the size and shape of your heart and how well your heart's chambers and valves are working. This procedure takes approximately one hour. There are no restrictions for this procedure.  Your physician recommends that you schedule a follow-up appointment in: 3-4 months with Dr. Herbie Baltimore.

## 2014-03-28 ENCOUNTER — Encounter: Payer: Self-pay | Admitting: Cardiology

## 2014-03-28 NOTE — Assessment & Plan Note (Addendum)
She does have labile pressures to see Ms. [name] listed has not had to use it. At home it usually runs in the 120-140 mm Hg systolic. My recheck it today was 140/60. Our approach for some permissive hypertension. Usually the Imdur alone is sufficient. With her COPD,I am reluctant to use a beta blocker. See above reference ACE inhibitor/ARB.  She had been on amlodipine which I do not see currently on. Since her home pressures are usually stable were okay with leaving as is.

## 2014-03-28 NOTE — Assessment & Plan Note (Signed)
Most likely related to her pulmonary condition. However we'll need to exclude any potential worsening cardiac function that could be contributing. Plan:2-D echocardiogram and Lexiscan Myoview

## 2014-03-28 NOTE — Assessment & Plan Note (Addendum)
Stable without significant claudication. Followup with Dr. Arbie Cookey

## 2014-03-28 NOTE — Assessment & Plan Note (Signed)
Statin intolerant. Monitored by PCP.

## 2014-03-28 NOTE — Assessment & Plan Note (Signed)
Status post AAA repair with aorto-bifem bypass surgery in 2004. Followed by vascular surgery, Dr. Arbie Cookey. As best possible risk factor modification. Unable to use a statin. Main treatment will be hypertension control.

## 2014-03-28 NOTE — Assessment & Plan Note (Signed)
Monitored by Dr. Arbie Cookey

## 2014-03-28 NOTE — Assessment & Plan Note (Signed)
Scheduled to see pulmonary medicine

## 2014-03-28 NOTE — Progress Notes (Signed)
PATIENT: [name] MRN: [id number]  DOB: [birth year]   DOV:03/28/2014 PCP: Hoyle Sauer, MD  Clinic Note: Chief Complaint  Patient presents with  . Shortness of Breath  . Coronary Artery Disease  . PAD   HPI: [name] is a 81 y.o. female with a PMH below who presents today for followup of her PAD and CAD. I last saw her in October of 2014 in followup after a Myoview Stress Test and Echocardiogram that were relatively normal.   As usual she comes in with her granddaughter who is her primary caregiver.  Since her last visit, she says over last for 5 or 6 months she's been doing with significant "lung condition. He is due to see the pulmonologist soon. She's had several episodes of "bronchitis for which she's been given steroids and antibiotics. I think this is negative for Manson Passey she's been on now. Each time she gets treated she feels better, but only gets back to being baseline dyspnea. He was told his COPD and uses nebulizers and other medications as needed as well as standing. She's also had a recent fall where she fell and broke some ribs, and she also fell and hit her head.  She just had a CT scan of the chest in December of 2015: There is centrilobular emphysema noted with lower lobe predominance bronchial wall thickening suggestive of chronic bronchitis/bronchiectasis. There is also a subpleural density noted in the left upper lobe.also noted was dense atherosclerotic disease in the aorta as well as lipomatous hypertrophy of the intra-atrial septum  Interval History:  She isn't noticing progressive symptoms of exertional dyspnea and orthopnea requiring 2 pillows. She is generally he. Because of her exertional dyspnea she is just not doing much of any activity. She is dealing with a chronic cough sometimes productive. Because of that she has some chest discomfort in her chest when coughing. She denies any PND with exception of getting a cough. She has edema that is intermittent  but stable. She had been on warfarin for her chronic DVTs until she fall with subdural hematoma, since then she has been off warfarin.  Cardiovascular ROS: positive for - chest pain, dyspnea on exertion, shortness of breath and Palpitations that are exacerbated by her nebulizers. negative for - edema, loss of consciousness, orthopnea, palpitations, paroxysmal nocturnal dyspnea, rapid heart rate or Syncope/near syncope, TIA/amaurosis fugax:   Past Medical History  Diagnosis Date  . History of  Angina     Treated with PCI in 2004; no active Symptoms.  . CAD S/P percutaneous coronary angioplasty 2004    PCI-RCA (2.39mm x 61mm Taxus DES)& Cx (2.75 mm x 14 mm Cypher DES)  . PAD (peripheral artery disease) 1994    History of right iliac stenting, fem-fem bypass in 94; aortobifem bypass/ AAA repair - November 2004; latest ABIs of been roughly 1 bilaterally, followed by Dr. Arbie Cookey  . Essential hypertension     Labile  . Hyperlipidemia LDL goal <70     Statin intolerant  . History of ischemic stroke without residual deficits     Diagnosed by Radiographic Imaging  . DVT, femoral, chronic     Bilateral -- was on chronic Warfarin until fall with subdural hematoma in 2015  . COPD mixed type CT Chest 01/2014    Former Smoker; Centrilobular emphysema with bronchiectasis  . Chronic pneumonia     Several "Bouts"  . Shortness of breath     Chronic MILD- WITH EXERTION. chronic  . Aortic atherosclerosis  Noted on CT scan. Also with aortobifem and AAA repair  . Bilateral edema of lower extremity   . Gastroesophageal reflux disease with hiatal hernia   . Barrett esophagus   . Osteoarthritis of both hips      status post multiple hip operations, recent fall with fracture.    [name] DJD (degenerative joint disease)   . Rheumatoid arthritis   . Fall from slip, trip, or stumble 08/20/2013  . Closed hip fracture     Right  . Neuropathy   . Iron deficiency anemia   . Osteoporosis   . Shoulder pain, right       RIGHT SHOULDER WITH LIMITED ROM--STATES PREVIOUS RT TOTAL SHOULDDER REPLACEMENT-BUT STILL HAS PROBLEMS WITH SHOULDER  . History of blood transfusion   . H/O migraine     "used to have them; not anymore" (08/22/2013)   Prior Cardiac / Vascular History: Past Surgical History  Procedure Laterality Date  . Abdominal aortic aneurysm repair  2004    AortoBi-Fem Bypass  . Coronary angioplasty with stent placement  2004    RCA: Taxus DES 2.5 mm x 12 mm; Circumflex: Cypher DES 2.75 mm 14 mm  . Doppler echocardiography  06/06/2012    EF 50-60%.  Mild LVH, grade one diastolic function; normal PA pressures and CVP  . Lower extremity venous dopplers  08/16/2012    Partially occlusive right femoral vein thrombosis, also noted in right peroneal vein; no thrombosis in left sided veins.  . Cardiac catheterization  October 2005    Patent RCA stent, 20% ISR of Cx stent; EF 50-55%  . Nm myoview ltd  April 2014    Bowel artifact, No ischemia or infarction  . Femoral-femoral bypass graft  1994    Following right iliac stenting  . Doppler echocardiography  06/06/2012    Mild LVH, EF 55-60%.  Normal wall motion.  Mildly elevated LVEDP with grade one diastolic function.  Mild LA dilatation.  Normal PA pressures.    Allergies  Allergen Reactions  . Codeine Nausea And Vomiting, Swelling and Rash  . Hydrocodone Nausea And Vomiting, Swelling and Rash    "mouth peeled"  . Morphine And Related Other (See Comments)    hallucinations  . Other Nausea And Vomiting    Most pain meds! other than tramadol. Tylenol, Toradol, and Robaxin are ok.    . Penicillins Hives  . Demerol [Meperidine] Nausea And Vomiting  . Propoxyphene Hives and Nausea And Vomiting  . Statins Other (See Comments)    INTOLERANT OF STATINS - messes up her legs and has muscle aches  . Tape Other (See Comments)    Needs paper tape - skin very fragile    Current Outpatient Prescriptions  Medication Sig Dispense Refill  . albuterol  (ACCUNEB) 1.25 MG/3ML nebulizer solution Take 1 ampule by nebulization 3 (three) times daily as needed for wheezing.    . Calcium Carbonate-Vitamin D (CALTRATE 600+D) 600-400 MG-UNIT per tablet Take 1 tablet by mouth every evening.     . Camphor-Menthol-Methyl Sal (SALONPAS) 1.2-5.7-6.3 % PTCH Place 1 patch onto the skin daily as needed (For pain.).    [name] cholecalciferol (VITAMIN D) 1000 UNITS tablet Take 2,000 Units by mouth every morning.    . cloNIDine (CATAPRES) 0.1 MG tablet Take 0.1 mg by mouth 2 (two) times daily as needed (if blood pressure is over 160 systolic).    [name] docusate sodium (COLACE) 100 MG capsule Take 100 mg by mouth 2 (two) times daily.     [name]  escitalopram (LEXAPRO) 5 MG tablet Take 5 mg by mouth every evening.     . Flaxseed, Linseed, (FLAX SEED OIL PO) Take 1 tablet by mouth daily at 12 noon.     . hydroxychloroquine (PLAQUENIL) 200 MG tablet Take 200 mg by mouth 2 (two) times daily.     . isosorbide mononitrate (IMDUR) 30 MG 24 hr tablet Take 1 tablet (30 mg total) by mouth every morning. MUST MAKE APPT FOR FUTURE REFILLS 30 tablet 0  . lactose free nutrition (BOOST PLUS) LIQD Take 237 mLs by mouth 2 (two) times daily between meals as needed (As requested by pt or family). 90 Can 0  . levofloxacin (LEVAQUIN) 500 MG tablet Take 500 mg by mouth daily.    . Linaclotide (LINZESS) 145 MCG CAPS capsule Take 290 mcg by mouth every morning.     . Multiple Vitamin (MULTIVITAMIN WITH MINERALS) TABS tablet Take 1 tablet by mouth daily at 12 noon.    [name] omeprazole (PRILOSEC OTC) 20 MG tablet Take 20 mg by mouth every morning.     . polyethylene glycol (MIRALAX / GLYCOLAX) packet Take 17 g by mouth daily. 14 each 0  . predniSONE (DELTASONE) 20 MG tablet Take 20 mg by mouth daily with breakfast. For 7 days    . promethazine (PHENERGAN) 12.5 MG tablet Take 12.5 mg by mouth every 6 (six) hours as needed for nausea (Patient takes with Tramadol).    [name] sulfaSALAzine (AZULFIDINE) 500 MG tablet Take  500 mg by mouth 3 (three) times daily.    . traMADol (ULTRAM) 50 MG tablet Take 1 tablet (50 mg total) by mouth every 6 (six) hours as needed for moderate pain. 20 tablet 0   No current facility-administered medications for this visit.    History   Social History Narrative    She is currently living back at home after a brief stay at [facility] for postoperative rehab.  Currently doing physical therapy.       She is widowed, and has no living children.  One of her granddaughters is with her.   She has 2 grandchildren and 1 great-grandchild. Does not smoke, does not drink.    ROS: A comprehensive Review of Systems - was performed Review of Systems  Constitutional: Positive for malaise/fatigue (from long-standing illness). Negative for fever and chills.  HENT: Negative for nosebleeds.   Respiratory: Positive for cough, sputum production, shortness of breath and wheezing.        Has been dealing with a LUNG condition for ~4-5 month -- on her ~4th or 5th round of Abx & Steroids (each time with notable improvement that is short-lived)  Cardiovascular: Positive for chest pain (with coughing & deep inspiration).  Gastrointestinal: Positive for blood in stool. Negative for melena.  Genitourinary: Positive for hematuria.  Neurological: Positive for dizziness (when notably dyspneic /moving too quickly). Negative for seizures and loss of consciousness.  Psychiatric/Behavioral: Negative.   All other systems reviewed and are negative.  PHYSICAL EXAM BP 172/66 mmHg  Pulse 89  Ht 5\' 1"  (1.549 m)  Wt 111 lb (50.349 kg)  BMI 20.98 kg/m2 - On Recheck BP 142/70 mmHg (@ home usually 120-140 mmHg systolic) General: She is a pleasant elderly woman in no acute distress, A&O x3, answers questions appropriately. She is sitting in a wheelchair just simply because that is how she has been brought in. She also has kyphosis but is relatively healthy appearing, just somewhat frail.  HEENT: NCAT, EOMI, MMM, and  anicteric sclerae.  Neck: Supple; no LAN, JVD, or carotid bruit.  Heart: RRR, normal S1, S2. No R/G but maybe a soft systolic click and a soft maybe 1/6 SEM along the sternal border.  Lungs: diffuse bilateral wheezes with basal rhochi, nonlabored, normal effort, good air movement.  Abdomen: Soft/NT/ND/NABS. No HSM.  Extremities: No C/C with trivial right-sided the edema that is nonpitting  MWU:[id number] today: Yes Rate: 80 , Rhythm: NSR, LAFB (-30) - no notable change; no ischemic changes   Recent Labs: None checked recently that is available to me  ASSESSMENT / PLAN: CAD S/P PCI RCA & Circ with DES She had a stress test a year and half ago for some atypical symptoms as part of preoperative evaluation. Now she is having worsening exertional dyspnea but also orthopnea. There is clearly a primary component, but with upcoming full pulmonology evaluation, but to clarify if there is an is not any cardiac component. Plan: Lexiscan Myoview to exclude ischemic cardiac etiology contributing to her symptoms. She does not have angina prior to her last PCI it was mostly dyspnea.  She is on Imdur.  Not on aspirin because she was on warfarin. Could probably restart aspirin once stable from a subdural hematoma standpoint. No blocker because of COPD. Nondistended and chronic cough, if her blood pressures are elevated we could consider a second generation ARB   Dyspnea on exertion Most likely related to her pulmonary condition. However we'll need to exclude any potential worsening cardiac function that could be contributing. Plan:2-D echocardiogram and Lexiscan Myoview   Orthopnea Dyspnea may be contributing to her COPD, the presence of orthopnea makes me more concerned about possibility of some CHF component.  Plan: 2-D echo   Essential hypertension She does have labile pressures to see Ms. Debarah Crape listed has not had to use it. At home it usually runs in the 120-140 mm Hg systolic. My recheck it  today was 140/60. Our approach for some permissive hypertension. Usually the Imdur alone is sufficient. With her COPD,I am reluctant to use a beta blocker. See above reference ACE inhibitor/ARB.  She had been on amlodipine which I do not see currently on. Since her home pressures are usually stable were okay with leaving as is.   Dyslipidemia, goal LDL below 70 Statin intolerant. Monitored by PCP.   PAD (peripheral artery disease) Stable without significant claudication. Followup with Dr. Arbie Cookey   AAA (abdominal aortic aneurysm) Monitored by Dr. Arbie Cookey   Aortic atherosclerosis Status post AAA repair with aorto-bifem bypass surgery in 2004. Followed by vascular surgery, Dr. Arbie Cookey. As best possible risk factor modification. Unable to use a statin. Main treatment will be hypertension control.   DVT, Right Femoral, "chronic" -  with extension to the peroneal Had been on warfarin. Now stopped following head bleed. Consider adding aspirin   COPD (chronic obstructive pulmonary disease) Scheduled to see pulmonary medicine     Orders Placed This Encounter  Procedures  . Myocardial Perfusion Imaging    Standing Status: Future     Number of Occurrences:      Standing Expiration Date: 03/28/2015    Order Specific Question:  Where should this test be performed    Answer:  [facility]    Order Specific Question:  Type of stress    Answer:  Lexiscan    Order Specific Question:  Patient weight in lbs    Answer:  111  . EKG 12-Lead  . 2D Echocardiogram without contrast    Standing Status:  Future     Number of Occurrences:      Standing Expiration Date: 03/28/2015    Order Specific Question:  Type of Echo    Answer:  Complete    Order Specific Question:  Where should this test be performed    Answer:  [facility]    Order Specific Question:  Reason for exam-Echo    Answer:  CAD Native Vessel  414.01 / I25.10    Order Specific Question:  Reason for exam-Echo     Answer:  Dyspnea  786.09 / R06.00    Followup: 3-4 months   HARDING, Piedad Climes, M.D., M.S. Interventional Cardiologist   Pager # [phone]

## 2014-03-28 NOTE — Assessment & Plan Note (Addendum)
She had a stress test a year and half ago for some atypical symptoms as part of preoperative evaluation. Now she is having worsening exertional dyspnea but also orthopnea. There is clearly a primary component, but with upcoming full pulmonology evaluation, but to clarify if there is an is not any cardiac component. Plan: Lexiscan Myoview to exclude ischemic cardiac etiology contributing to her symptoms. She does not have angina prior to her last PCI it was mostly dyspnea.  She is on Imdur.  Not on aspirin because she was on warfarin. Could probably restart aspirin once stable from a subdural hematoma standpoint. No blocker because of COPD. Nondistended and chronic cough, if her blood pressures are elevated we could consider a second generation ARB

## 2014-03-28 NOTE — Assessment & Plan Note (Signed)
Had been on warfarin. Now stopped following head bleed. Consider adding aspirin

## 2014-03-28 NOTE — Assessment & Plan Note (Signed)
Dyspnea may be contributing to her COPD, the presence of orthopnea makes me more concerned about possibility of some CHF component.  Plan: 2-D echo

## 2014-04-03 ENCOUNTER — Telehealth (HOSPITAL_COMMUNITY): Payer: Self-pay

## 2014-04-03 NOTE — Telephone Encounter (Signed)
Encounter complete. 

## 2014-04-08 ENCOUNTER — Ambulatory Visit (HOSPITAL_BASED_OUTPATIENT_CLINIC_OR_DEPARTMENT_OTHER)
Admission: RE | Admit: 2014-04-08 | Discharge: 2014-04-08 | Disposition: A | Discharge status: Home / Self Care | Payer: Medicare Other | Source: Ambulatory Visit | Attending: Cardiovascular Disease | Admitting: Cardiovascular Disease

## 2014-04-08 ENCOUNTER — Ambulatory Visit (HOSPITAL_COMMUNITY)
Admission: RE | Admit: 2014-04-08 | Discharge: 2014-04-08 | Disposition: A | Discharge status: Home / Self Care | Payer: Medicare Other | Source: Ambulatory Visit | Attending: Cardiovascular Disease | Admitting: Cardiovascular Disease

## 2014-04-08 DIAGNOSIS — R069 Unspecified abnormalities of breathing: Secondary | ICD-10-CM | POA: Diagnosis not present

## 2014-04-08 DIAGNOSIS — R0600 Dyspnea, unspecified: Secondary | ICD-10-CM | POA: Insufficient documentation

## 2014-04-08 DIAGNOSIS — Z9861 Coronary angioplasty status: Secondary | ICD-10-CM

## 2014-04-08 DIAGNOSIS — I2510 Atherosclerotic heart disease of native coronary artery without angina pectoris: Secondary | ICD-10-CM

## 2014-04-08 DIAGNOSIS — I10 Essential (primary) hypertension: Secondary | ICD-10-CM | POA: Diagnosis not present

## 2014-04-08 DIAGNOSIS — R0609 Other forms of dyspnea: Secondary | ICD-10-CM

## 2014-04-08 DIAGNOSIS — E785 Hyperlipidemia, unspecified: Secondary | ICD-10-CM

## 2014-04-08 DIAGNOSIS — R0601 Orthopnea: Secondary | ICD-10-CM

## 2014-04-08 MED ORDER — TECHNETIUM TC 99M SESTAMIBI GENERIC - CARDIOLITE
10.5000 | Freq: Once | INTRAVENOUS | Status: AC | PRN
  Administered 2014-04-08: 11 via INTRAVENOUS

## 2014-04-08 MED ORDER — TECHNETIUM TC 99M SESTAMIBI GENERIC - CARDIOLITE
30.7000 | Freq: Once | INTRAVENOUS | Status: AC | PRN
  Administered 2014-04-08: 30.7 via INTRAVENOUS

## 2014-04-08 MED ORDER — REGADENOSON 0.4 MG/5ML IV SOLN
0.4000 mg | Freq: Once | INTRAVENOUS | Status: AC
  Administered 2014-04-08: 0.4 mg via INTRAVENOUS

## 2014-04-08 NOTE — Progress Notes (Signed)
Quick Note:  Echo results: Good news: Essentially normal echocardiogram and normal pump function and normal valve function. As expected she has relaxation abnormality which is not unusual for age.  No regional wall motion abnormalities to suggest heart attack. Normal pulmonary pressures. EF: 55-60%.   Marykay Lex, MD    ______

## 2014-04-08 NOTE — Procedures (Addendum)
[facility] [address] [place] [address] [place] [address] [phone]  Cardiology Nuclear Med Study  [name] is a 81 y.o. female     MRN : [id number]     DOB: September 22, 1930  Procedure Date: 04/08/2014  Nuclear Med Background Indication for Stress Test:  Evaluation for Ischemia and Follow up CAD History:  COPD, Emphysema and CAD;Last NUC MPI on 06/06/2012-normal;EF=47%;DVT;PAD-AAA; Cardiac Risk Factors: CVA, Family History - CAD, History of Smoking, Hypertension, Lipids and Aortic Atherosclerosis noted on CT scan  Symptoms:  Chest Pain, DOE, Palpitations and SOB   Nuclear Pre-Procedure Caffeine/Decaff Intake:  12:00am NPO After: 10am   IV Site: R Forearm  IV 0.9% NS with Angio Cath:  22g  Chest Size (in):  n/a IV Started by: Berdie Ogren, RN  Height: 5\' 1"  (1.549 m)  Cup Size: B  BMI:  Body mass index is 20.98 kg/(m^2). Weight:  111 lb (50.349 kg)   Tech Comments:  n/a    Nuclear Med Study 1 or 2 day study: 1 day  Stress Test Type:  Lexiscan  Order Authorizing Provider:  , MD   Resting Radionuclide: Technetium 8m Sestamibi  Resting Radionuclide Dose: 10.5 mCi   Stress Radionuclide:  Technetium 49m Sestamibi  Stress Radionuclide Dose: 30.7 mCi           Stress Protocol Rest HR: 77 Stress HR: 88  Rest BP: 171/72 Stress BP: 150/69  Exercise Time (min): n/a METS: n/a   Predicted Max HR: 138 bpm % Max HR: 69.57 bpm Rate Pressure Product: 84m  Dose of Adenosine (mg):  n/a Dose of Lexiscan: 0.4 mg  Dose of Atropine (mg): n/a Dose of Dobutamine: n/a mcg/kg/min (at max HR)  Stress Test Technologist: [id number], CCT Nuclear Technologist: Esperanza Sheets, CNMT   Rest Procedure:  Myocardial perfusion imaging was performed at rest 45 minutes following the intravenous administration of Technetium 7m Sestamibi. Stress Procedure:  The patient received IV Lexiscan 0.4 mg over 15-seconds.  Technetium 66m Sestamibi  injected IV at 30-seconds.  There were no significant changes with Lexiscan.  Quantitative spect images were obtained after a 45 minute delay.  Transient Ischemic Dilatation (Normal <1.22): 0.99  QGS EDV:  n/a ml QGS ESV:  n/a ml LV Ejection Fraction: Study not gated      Rest ECG: NSR, frequent PACs  Stress ECG: No significant change from baseline ECG  QPS Raw Data Images:  There is interference from nuclear activity from structures below the diaphragm. This does not affect the ability to read the study. Stress Images:  Normal homogeneous uptake in all areas of the myocardium. Rest Images:  Normal homogeneous uptake in all areas of the myocardium. Subtraction (SDS):  No evidence of ischemia. LV Wall Motion:  not gated  Impression Exercise Capacity:  Lexiscan with no exercise. BP Response:  Normal blood pressure response. Clinical Symptoms:  No significant symptoms noted. ECG Impression:  No significant ECG changes with Lexiscan. Comparison with Prior Nuclear Study: No significant change from previous study   Overall Impression:  Low risk stress nuclear study without gated images due to ectopy.   84m, MD  04/08/2014 3:53 PM

## 2014-04-08 NOTE — Progress Notes (Signed)
2D Echo Performed 04/08/2014    Annabelle Rexroad, RCS  

## 2014-04-09 ENCOUNTER — Telehealth: Payer: Self-pay | Admitting: *Deleted

## 2014-04-09 DIAGNOSIS — I10 Essential (primary) hypertension: Secondary | ICD-10-CM | POA: Diagnosis not present

## 2014-04-09 DIAGNOSIS — K5900 Constipation, unspecified: Secondary | ICD-10-CM | POA: Diagnosis not present

## 2014-04-09 DIAGNOSIS — R0602 Shortness of breath: Secondary | ICD-10-CM | POA: Diagnosis not present

## 2014-04-09 DIAGNOSIS — M069 Rheumatoid arthritis, unspecified: Secondary | ICD-10-CM | POA: Diagnosis not present

## 2014-04-09 DIAGNOSIS — E1151 Type 2 diabetes mellitus with diabetic peripheral angiopathy without gangrene: Secondary | ICD-10-CM | POA: Diagnosis not present

## 2014-04-09 DIAGNOSIS — K219 Gastro-esophageal reflux disease without esophagitis: Secondary | ICD-10-CM | POA: Diagnosis not present

## 2014-04-09 DIAGNOSIS — J449 Chronic obstructive pulmonary disease, unspecified: Secondary | ICD-10-CM | POA: Diagnosis not present

## 2014-04-09 DIAGNOSIS — I2510 Atherosclerotic heart disease of native coronary artery without angina pectoris: Secondary | ICD-10-CM | POA: Diagnosis not present

## 2014-04-09 NOTE — Telephone Encounter (Signed)
-----   Message from Marykay Lex, MD sent at 04/08/2014  2:52 PM EST ----- Echo results: Good news: Essentially normal echocardiogram and normal pump function and normal valve function.  As expected she has relaxation abnormality which is not unusual for age.  No regional wall motion abnormalities to suggest heart attack. Normal pulmonary pressures. EF: 55-60%.   Marykay Lex, MD

## 2014-04-09 NOTE — Telephone Encounter (Signed)
GRANDDAUGHTER CALLED BACK  Result given . Verbalized understanding Granddaughter,Tammy states patient had an episode last evening- patient had discomfort in neck and stomach discomfort received NTG X1 with relief. Tammy think she may have a reaction from having both test done. RN nformed Tammy to continue to monitor- if increase use of NTG is used - go to ER,/ contact office. She verbalized understandingi.

## 2014-04-09 NOTE — Telephone Encounter (Signed)
Left message to call back  

## 2014-04-10 ENCOUNTER — Telehealth: Payer: Self-pay | Admitting: *Deleted

## 2014-04-10 NOTE — Telephone Encounter (Signed)
-----   Message from Marykay Lex, MD sent at 04/10/2014  2:53 PM EST ----- Stress Test looked good!! No sign of significant Heart Artery Disease.    Good news!!.  Marykay Lex, MD

## 2014-04-10 NOTE — Progress Notes (Signed)
Quick Note:  Stress Test looked good!! No sign of significant Heart Artery Disease.  Good news!!.  HARDING,DAVID W, MD  ______ 

## 2014-04-10 NOTE — Telephone Encounter (Signed)
Spoke to patient and granddaughter Tammy. Result given . Verbalized understanding

## 2014-04-15 ENCOUNTER — Other Ambulatory Visit: Payer: Self-pay | Admitting: Cardiology

## 2014-04-15 NOTE — Telephone Encounter (Signed)
Rx(s) sent to pharmacy electronically.  

## 2014-04-16 ENCOUNTER — Encounter: Payer: Self-pay | Admitting: Internal Medicine

## 2014-04-16 ENCOUNTER — Ambulatory Visit (INDEPENDENT_AMBULATORY_CARE_PROVIDER_SITE_OTHER): Payer: Medicare Other | Admitting: Internal Medicine

## 2014-04-16 VITALS — BP 128/72 | HR 86 | Ht 61.0 in | Wt 113.0 lb

## 2014-04-16 DIAGNOSIS — R05 Cough: Secondary | ICD-10-CM | POA: Diagnosis not present

## 2014-04-16 DIAGNOSIS — R053 Chronic cough: Secondary | ICD-10-CM

## 2014-04-16 DIAGNOSIS — I2510 Atherosclerotic heart disease of native coronary artery without angina pectoris: Secondary | ICD-10-CM | POA: Diagnosis not present

## 2014-04-16 DIAGNOSIS — Z9861 Coronary angioplasty status: Secondary | ICD-10-CM

## 2014-04-16 MED ORDER — FLUTICASONE PROPIONATE 50 MCG/ACT NA SUSP: 2.0000 | Freq: Every day | NASAL | Status: AC

## 2014-04-16 MED ORDER — FLUTICASONE FUROATE-VILANTEROL 100-25 MCG/INH IN AEPB: 1.0000 | INHALATION_SPRAY | Freq: Every day | RESPIRATORY_TRACT | Status: AC

## 2014-04-16 MED ORDER — PREDNISONE 10 MG PO TABS: ORAL_TABLET | ORAL | Status: DC

## 2014-04-16 NOTE — Progress Notes (Signed)
Subjective:    Patient ID: [name], female    DOB: May 16, 1930, 81 y.o.   MRN: [id number]  PCP Hoyle Sauer, MD  HPI  IOV 04/16/2014  Chief Complaint  Patient presents with  . Pulmonary Consult    Pt referred by Dr. Corliss Skains for chronic cough. Pt has been on pred and abx intermittently since November. Pt states cough produces yellow mucus, increase in SOB and CP with and without activity.    81 year old female presents with her granddaughter. Her daughter is deceased due to lifestyle issues. Patient lives alone but granddaughter also lives in [place] but travels frequently to [place] for work. Patient has a long-standing history of smoking but quit in 2002 and long-standing history of rheumatoid arthritis rheumatoid arthritis. Since 2012 has been using a walker and is a frequent history of falls. She's no longer on chronic anticoagulation. She is on sulfasalazine for the last 1 year for her rheumatoid arthritis along with Plaquenil. Since October 2015 has been having insidious onset of chronic cough associated with sputum. Cough is rated as severe with no aggravating or relieving factors. It is episodic. It is present on a daily basis. No diurnal variation. Sputum color is sometimes clear sometimes yellow. Each time sputum changes color she's given a antibiotic and prednisone course which temporarily helps but only to a certain extent. Cough never goes away despite antibiotic and prednisone treatment. Most recently started on prednisone taper 4 days ago along with antibiotic but this has not helped this time around. Granddaughter is concerned that sulfasalazine is contributing to cough  Cough relevant history - Sinus drainage and allergies: They deny - Medication review: She is not on ACE inhibitors but is on sulfasalazine which is rarely associated with eosinophilic inflammation related cough. She does have some mild peripheral eosinophilia on prior lab review. However she is  on flaxseed - Acid reflux: According to the patient and her granddaughter this is severe and associated with Barrett's esophagus. She is on PPI treatment but without good control. She is on flaxseed - Asthma/COPD: They deny but cough is associated with wheezing periodically. CT chest from December 2015 does not show evidence of interstitial lung disease according my personal opinion - Exposure history: Prior history of smoking present     has a past medical history of History of  Angina; CAD S/P percutaneous coronary angioplasty (2004); PAD (peripheral artery disease) (1994); Essential hypertension; Hyperlipidemia LDL goal <70; History of ischemic stroke without residual deficits; DVT, femoral, chronic; COPD mixed type (CT Chest 01/2014); Chronic pneumonia; Shortness of breath; Aortic atherosclerosis; Bilateral edema of lower extremity; Gastroesophageal reflux disease with hiatal hernia; Barrett esophagus; Osteoarthritis of both hips; DJD (degenerative joint disease); Rheumatoid arthritis; Fall from slip, trip, or stumble (08/20/2013); Closed hip fracture; Neuropathy; Iron deficiency anemia; Osteoporosis; Shoulder pain, right; History of blood transfusion; and H/O migraine.   reports that she quit smoking about 13 years ago. Her smoking use included Cigarettes. She has a 50 pack-year smoking history. She has never used smokeless tobacco.  Past Surgical History  Procedure Laterality Date  . Back surgery      x 2  . Appendectomy  1950  . Total shoulder replacement Right 2011  . Cholecystectomy      several yrs ago  . Cataract extraction w/ intraocular lens  implant, bilateral Bilateral 2001-2002  . Total abdominal hysterectomy  1965  . Upper gastrointestinal endoscopy  multiple    w/bx, hiatal hernia, Barretts', esophageal polyp 1x  . Colonoscopy  05/09/2004; 06/08/1999    internal hemorrhoids; diverticulosis  . Shoulder open rotator cuff repair Bilateral     2 times on left  . Lumbar spine  surgery  October 2012    spinal stenosis, Dr. Ophelia Charter  . Abdominal aortic aneurysm repair  2004    AortoBi-Fem Bypass  . Carpal tunnel release Left 04/2011  . Total hip arthroplasty  01/26/2012    Procedure: TOTAL HIP ARTHROPLASTY ANTERIOR APPROACH;  Surgeon: Kathryne Hitch, MD;  Location: [facility];  Service: Orthopedics;  Laterality: Left;  Left Total Hip Arthroplasty, Anterior Approach (C-Arm)  . Intramedullary (im) nail intertrochanteric Right 04/12/2012    Procedure: INTRAMEDULLARY (IM) NAIL INTERTROCHANTRIC Right;  Surgeon: Kathryne Hitch, MD;  Location: [facility];  Service: Orthopedics;  Laterality: Right;  . Doppler echocardiography  06/06/2012    EF 50-60%.  Mild LVH, grade one diastolic function; normal PA pressures and CVP  . Lower extremity venous dopplers  08/16/2012    Partially occlusive right femoral vein thrombosis, also noted in right peroneal vein; no thrombosis in left sided veins.  . Nm myoview ltd  April 2014    Bowel artifact, No ischemia or infarction  . Femoral-femoral bypass graft  1994    Following right iliac stenting  . Doppler echocardiography  06/06/2012    Mild LVH, EF 55-60%.  Normal wall motion.  Mildly elevated LVEDP with grade one diastolic function.  Mild LA dilatation.  Normal PA pressures.  . Tubal ligation  1956  . Excision/release bursa hip Right 12/27/2012    Procedure: RIGHT HIP BURSECTOMY AND COMPRESSION SCREW REMOVAL;  Surgeon: Kathryne Hitch, MD;  Location: [facility];  Service: Orthopedics;  Laterality: Right;  . Joint replacement    . Coronary angioplasty with stent placement  2004    RCA: Taxus DES 2.5 mm x 12 mm; Circumflex: Cypher DES 2.75 mm 14 mm  . Cardiac catheterization  October 2005    Patent RCA stent, 20% ISR of Cx stent; EF 50-55%  . Lexiscan cardiolite nuclear stress test      Allergies  Allergen Reactions  . Codeine Nausea And Vomiting, Swelling and Rash  . Hydrocodone Nausea And Vomiting, Swelling and Rash     "mouth peeled"  . Morphine And Related Other (See Comments)    hallucinations  . Other Nausea And Vomiting    Most pain meds! other than tramadol. Tylenol, Toradol, and Robaxin are ok.    . Penicillins Hives  . Demerol [Meperidine] Nausea And Vomiting  . Propoxyphene Hives and Nausea And Vomiting  . Statins Other (See Comments)    INTOLERANT OF STATINS - messes up her legs and has muscle aches  . Tape Other (See Comments)    Needs paper tape - skin very fragile    Immunization History  Administered Date(s) Administered  . Influenza Split 10/28/2013  . Pneumococcal-Unspecified 02/21/2011    Family History  Problem Relation Age of Onset  . Prostate cancer Father   . Diabetes Mother   . Colon cancer Neg Hx   . Breast cancer Mother   . Heart disease Mother      Current outpatient prescriptions:  .  albuterol (ACCUNEB) 1.25 MG/3ML nebulizer solution, Take 1 ampule by nebulization 3 (three) times daily as needed for wheezing., Disp: , Rfl:  .  albuterol (PROVENTIL HFA;VENTOLIN HFA) 108 (90 BASE) MCG/ACT inhaler, Inhale 2 puffs into the lungs every 6 (six) hours as needed for wheezing or shortness of breath., Disp: , Rfl:  .  Calcium Carbonate-Vitamin D (CALTRATE 600+D) 600-400 MG-UNIT per tablet, Take 1 tablet by mouth every evening. , Disp: , Rfl:  .  Camphor-Menthol-Methyl Sal (SALONPAS) 1.2-5.7-6.3 % PTCH, Place 1 patch onto the skin daily as needed (For pain.)., Disp: , Rfl:  .  cholecalciferol (VITAMIN D) 1000 UNITS tablet, Take 2,000 Units by mouth every morning., Disp: , Rfl:  .  cloNIDine (CATAPRES) 0.1 MG tablet, Take 0.1 mg by mouth 2 (two) times daily as needed (if blood pressure is over 160 systolic)., Disp: , Rfl:  .  docusate sodium (COLACE) 100 MG capsule, Take 100 mg by mouth daily. , Disp: , Rfl:  .  escitalopram (LEXAPRO) 5 MG tablet, Take 5 mg by mouth every evening. , Disp: , Rfl:  .  Flaxseed, Linseed, (FLAX SEED OIL PO), Take 1 tablet by mouth daily at 12  noon. , Disp: , Rfl:  .  hydroxychloroquine (PLAQUENIL) 200 MG tablet, Take 300 mg by mouth daily. , Disp: , Rfl:  .  isosorbide mononitrate (IMDUR) 30 MG 24 hr tablet, Take 1 tablet (30 mg total) by mouth daily., Disp: 30 tablet, Rfl: 11 .  lactose free nutrition (BOOST PLUS) LIQD, Take 237 mLs by mouth 2 (two) times daily between meals as needed (As requested by pt or family)., Disp: 90 Can, Rfl: 0 .  levofloxacin (LEVAQUIN) 500 MG tablet, Take 500 mg by mouth daily., Disp: , Rfl:  .  Linaclotide (LINZESS) 145 MCG CAPS capsule, Take 290 mcg by mouth every morning. , Disp: , Rfl:  .  Multiple Vitamin (MULTIVITAMIN WITH MINERALS) TABS tablet, Take 1 tablet by mouth daily at 12 noon., Disp: , Rfl:  .  omeprazole (PRILOSEC OTC) 20 MG tablet, Take 20 mg by mouth every morning. , Disp: , Rfl:  .  polyethylene glycol (MIRALAX / GLYCOLAX) packet, Take 17 g by mouth daily. (Patient taking differently: Take 17 g by mouth daily as needed. ), Disp: 14 each, Rfl: 0 .  predniSONE (DELTASONE) 20 MG tablet, Take 20 mg by mouth daily with breakfast. For 7 days, Disp: , Rfl:  .  promethazine (PHENERGAN) 12.5 MG tablet, Take 12.5 mg by mouth every 6 (six) hours as needed for nausea (Patient takes with Tramadol)., Disp: , Rfl:  .  sulfaSALAzine (AZULFIDINE) 500 MG tablet, Take 500 mg by mouth 3 (three) times daily., Disp: , Rfl:  .  traMADol (ULTRAM) 50 MG tablet, Take 1 tablet (50 mg total) by mouth every 6 (six) hours as needed for moderate pain., Disp: 20 tablet, Rfl: 0      Review of Systems  Constitutional: Negative for fever and unexpected weight change.  HENT: Positive for nosebleeds. Negative for congestion, dental problem, ear pain, postnasal drip, rhinorrhea, sinus pressure, sneezing, sore throat and trouble swallowing.   Eyes: Negative for redness and itching.  Respiratory: Positive for cough and shortness of breath. Negative for chest tightness and wheezing.   Cardiovascular: Positive for chest  pain. Negative for palpitations and leg swelling.  Gastrointestinal: Positive for nausea. Negative for vomiting.  Genitourinary: Negative for dysuria.  Musculoskeletal: Negative for joint swelling.  Skin: Negative for rash.  Neurological: Negative for headaches.  Hematological: Does not bruise/bleed easily.  Psychiatric/Behavioral: Negative for dysphoric mood. The patient is not nervous/anxious.        Objective:   Physical Exam  Constitutional: She is oriented to person, place, and time. No distress.  Frail, plesant  HENT:  Head: Normocephalic and atraumatic.  Right Ear: External ear normal.  Left Ear: External ear normal.  Mouth/Throat: Oropharynx is clear and moist. No oropharyngeal exudate.  Eyes: Conjunctivae and EOM are normal. Pupils are equal, round, and reactive to light. Right eye exhibits no discharge. Left eye exhibits no discharge. No scleral icterus.  Neck: Normal range of motion. Neck supple. No JVD present. No tracheal deviation present. No thyromegaly present.  Cardiovascular: Normal rate, regular rhythm, normal heart sounds and intact distal pulses.  Exam reveals no gallop and no friction rub.   No murmur heard. Pulmonary/Chest: Effort normal. No respiratory distress. She has wheezes. She has no rales. She exhibits no tenderness.  Kyphotic barrel chested with significant wheezing bilaterally  Abdominal: Soft. Bowel sounds are normal. She exhibits no distension and no mass. There is no tenderness. There is no rebound and no guarding.  Musculoskeletal: She exhibits no edema or tenderness.  Sitting with a walker in front of her significant rheumatoid arthritis deformities of the hand percent  Lymphadenopathy:    She has no cervical adenopathy.  Neurological: She is alert and oriented to person, place, and time. She has normal reflexes. No cranial nerve deficit. She exhibits normal muscle tone. Coordination normal.  Skin: Skin is warm and dry. No rash noted. She is not  diaphoretic. No erythema. No pallor.  Psychiatric: She has a normal mood and affect. Her behavior is normal. Judgment and thought content normal.  Vitals reviewed.   Filed Vitals:   04/16/14 1522  BP: 128/72  Pulse: 86  Height: 5\' 1"  (1.549 m)  Weight: 113 lb (51.256 kg)  SpO2: 97%          Assessment & Plan:     ICD-9-CM ICD-10-CM   1. Chronic cough 786.2 R05    Chronic cough. Etiology specifically is unclear. I suspect multifactorial etiology. Possibilities include postnasal drip, ongoing severe acid reflux possibly made worse by her flaxseed, undiagnosed obstructive lung disease given the presence of wheezing, possible sulfasalazine related eosinophilic airway inflammation as evidenced by wheezing and mild peripheral eosinophilia and presence of sulfasalazine  I do not see evidence of interstitial lung disease and CT scan of the chest December 2015 and therefore this is unlikely as a candidate of cough although she is at risk for this given her acid reflux and rheumatoid arthritis  Recommendation  - For acid reflux    - Stop flaxseed  - Continue Prilosec  - For possible sinus nasal drip that is silent  - - Start nasal steroid  - For airway inflammation which could be sulfasalazine aggravated with underlying asthma/COPD given her prior history of smoking   - Stop sulfasalazine  - Start inhale corticosteroid bronchodilator combination Brio  - Prednisone taper for 12 days would restart with 8 day taper starting today   Follow-up  - Reassess in 3-4 weeks - If no response consider gabapentin for irritable larynx syndrome   Granddaughter and she and agreement with plan. They will monitor pain of rheumatoid arthritis without sulfasalazine   Dr. January 2016, M.D., [facility].C.P Pulmonary and Critical Care Medicine Staff Physician [facility] Pager: [phone], If no answer or between  15:00h - 7:00h: call [phone]  04/16/2014 4:13 PM

## 2014-04-16 NOTE — Patient Instructions (Addendum)
ICD-9-CM ICD-10-CM   1. Chronic cough 786.2 R05     Stop flaxseed Stop sufasalazine  Restart prednisone as follows  Take prednisone 40 mg daily x 2 days, then 20mg  daily x 2 days, then 10mg  daily x 2 days, then 5mg  daily x 2 days and stop  STart BREO 1 puff daily USe pro-air as needed  START generic fluticasone inhaler 2 squirts each nostril daily  Cotninue prilosec and other medicatioins  FOllowup  3-4 weeks with me or my NP tammy Spirometry at time of followup

## 2014-05-08 ENCOUNTER — Ambulatory Visit (INDEPENDENT_AMBULATORY_CARE_PROVIDER_SITE_OTHER): Payer: Medicare Other | Admitting: Internal Medicine

## 2014-05-08 ENCOUNTER — Encounter: Payer: Self-pay | Admitting: Internal Medicine

## 2014-05-08 ENCOUNTER — Institutional Professional Consult (permissible substitution): Payer: Self-pay | Admitting: Internal Medicine

## 2014-05-08 VITALS — BP 164/78 | HR 76 | Ht 61.0 in | Wt 116.0 lb

## 2014-05-08 DIAGNOSIS — Z9861 Coronary angioplasty status: Secondary | ICD-10-CM | POA: Diagnosis not present

## 2014-05-08 DIAGNOSIS — I2510 Atherosclerotic heart disease of native coronary artery without angina pectoris: Secondary | ICD-10-CM

## 2014-05-08 DIAGNOSIS — R053 Chronic cough: Secondary | ICD-10-CM

## 2014-05-08 DIAGNOSIS — R05 Cough: Secondary | ICD-10-CM

## 2014-05-08 NOTE — Progress Notes (Signed)
Subjective:    Patient ID: [name], female    DOB: 1930-04-16, 81 y.o.   MRN: [id number]  HPI  HPI  IOV 04/16/2014  Chief Complaint  Patient presents with  . Pulmonary Consult    Pt referred by Dr. Corliss Skains for chronic cough. Pt has been on pred and abx intermittently since November. Pt states cough produces yellow mucus, increase in SOB and CP with and without activity.    81 year old female presents with her granddaughter. Her daughter is deceased due to lifestyle issues. Patient lives alone but granddaughter also lives in [place] but travels frequently to [place] for work. Patient has a long-standing history of smoking but quit in 2002 and long-standing history of rheumatoid arthritis rheumatoid arthritis. Since 2012 has been using a walker and is a frequent history of falls. She's no longer on chronic anticoagulation. She is on sulfasalazine for the last 1 year for her rheumatoid arthritis along with Plaquenil. Since October 2015 has been having insidious onset of chronic cough associated with sputum. Cough is rated as severe with no aggravating or relieving factors. It is episodic. It is present on a daily basis. No diurnal variation. Sputum color is sometimes clear sometimes yellow. Each time sputum changes color she's given a antibiotic and prednisone course which temporarily helps but only to a certain extent. Cough never goes away despite antibiotic and prednisone treatment. Most recently started on prednisone taper 4 days ago along with antibiotic but this has not helped this time around. Granddaughter is concerned that sulfasalazine is contributing to cough  Cough relevant history - Sinus drainage and allergies: They deny - Medication review: She is not on ACE inhibitors but is on sulfasalazine which is rarely associated with eosinophilic inflammation related cough. She does have some mild peripheral eosinophilia on prior lab review. However she is also on flaxseed -  Acid reflux: According to the patient and her granddaughter this is severe and associated with Barrett's esophagus. She is on PPI treatment but without good control. She is on flaxseed - Asthma/COPD: They deny but cough is associated with wheezing periodically. CT chest from December 2015 does not show evidence of interstitial lung disease according my personal opinion - Exposure history: Prior history of smoking pre    OV 05/08/2014  Chief Complaint  Patient presents with  . Follow-up    Pt stated she is much imporved improved since last OV. Pt stated her cough has resolved. Pt c/o DOE. Pt stated her Virgel Bouquet is very expensive and is unable to afford the monthly payment but it is working very well for her.     81 year old female seen one month ago for chronic cough since October 2015. Sulfasalazine was stopped 5 day prednisone was initiated along with Brio inhaler. With these measures within a few days of starting prednisone and stopping sulfasalazine her cough resolved. She is extremely happy with the outcome. Currently feeling fine. She does not feel that she needs to take Brio anymore. She and grandaughyter feel Sulfasalazine caused cough   Allergies  Allergen Reactions  . Codeine Nausea And Vomiting, Swelling and Rash  . Hydrocodone Nausea And Vomiting, Swelling and Rash    "mouth peeled"  . Morphine And Related Other (See Comments)    hallucinations  . Other Nausea And Vomiting    Most pain meds! other than tramadol. Tylenol, Toradol, and Robaxin are ok.    . Penicillins Hives  . Demerol [Meperidine] Nausea And Vomiting  . Propoxyphene Hives and Nausea And  Vomiting  . Statins Other (See Comments)    INTOLERANT OF STATINS - messes up her legs and has muscle aches  . Sulfasalazine   . Tape Other (See Comments)    Needs paper tape - skin very fragile     Review of Systems  Constitutional: Negative for fever and unexpected weight change.  HENT: Negative for congestion, dental  problem, ear pain, nosebleeds, postnasal drip, rhinorrhea, sinus pressure, sneezing, sore throat and trouble swallowing.   Eyes: Negative for redness and itching.  Respiratory: Positive for shortness of breath. Negative for cough, chest tightness and wheezing.   Cardiovascular: Negative for palpitations and leg swelling.  Gastrointestinal: Negative for nausea and vomiting.  Genitourinary: Negative for dysuria.  Musculoskeletal: Negative for joint swelling.  Skin: Negative for rash.  Neurological: Negative for headaches.  Hematological: Does not bruise/bleed easily.  Psychiatric/Behavioral: Negative for dysphoric mood. The patient is not nervous/anxious.     Current outpatient prescriptions:  .  albuterol (ACCUNEB) 1.25 MG/3ML nebulizer solution, Take 1 ampule by nebulization 3 (three) times daily as needed for wheezing., Disp: , Rfl:  .  albuterol (PROVENTIL HFA;VENTOLIN HFA) 108 (90 BASE) MCG/ACT inhaler, Inhale 2 puffs into the lungs every 6 (six) hours as needed for wheezing or shortness of breath., Disp: , Rfl:  .  Calcium Carbonate-Vitamin D (CALTRATE 600+D) 600-400 MG-UNIT per tablet, Take 1 tablet by mouth every evening. , Disp: , Rfl:  .  Camphor-Menthol-Methyl Sal (SALONPAS) 1.2-5.7-6.3 % PTCH, Place 1 patch onto the skin daily as needed (For pain.)., Disp: , Rfl:  .  cholecalciferol (VITAMIN D) 1000 UNITS tablet, Take 2,000 Units by mouth every morning., Disp: , Rfl:  .  docusate sodium (COLACE) 100 MG capsule, Take 100 mg by mouth daily. , Disp: , Rfl:  .  escitalopram (LEXAPRO) 5 MG tablet, Take 5 mg by mouth every evening. , Disp: , Rfl:  .  Fluticasone Furoate-Vilanterol (BREO ELLIPTA) 100-25 MCG/INH AEPB, Inhale 1 puff into the lungs daily., Disp: 1 each, Rfl: 5 .  hydroxychloroquine (PLAQUENIL) 200 MG tablet, Take 300 mg by mouth daily. , Disp: , Rfl:  .  isosorbide mononitrate (IMDUR) 30 MG 24 hr tablet, Take 1 tablet (30 mg total) by mouth daily., Disp: 30 tablet, Rfl: 11 .   lactose free nutrition (BOOST PLUS) LIQD, Take 237 mLs by mouth 2 (two) times daily between meals as needed (As requested by pt or family)., Disp: 90 Can, Rfl: 0 .  Multiple Vitamin (MULTIVITAMIN WITH MINERALS) TABS tablet, Take 1 tablet by mouth daily at 12 noon., Disp: , Rfl:  .  omeprazole (PRILOSEC OTC) 20 MG tablet, Take 20 mg by mouth every morning. , Disp: , Rfl:  .  polyethylene glycol (MIRALAX / GLYCOLAX) packet, Take 17 g by mouth daily. (Patient taking differently: Take 17 g by mouth daily as needed. ), Disp: 14 each, Rfl: 0 .  promethazine (PHENERGAN) 12.5 MG tablet, Take 12.5 mg by mouth every 6 (six) hours as needed for nausea (Patient takes with Tramadol)., Disp: , Rfl:  .  traMADol (ULTRAM) 50 MG tablet, Take 1 tablet (50 mg total) by mouth every 6 (six) hours as needed for moderate pain., Disp: 20 tablet, Rfl: 0 .  fluticasone (FLONASE) 50 MCG/ACT nasal spray, Place 2 sprays into both nostrils daily. (Patient not taking: Reported on 05/08/2014), Disp: 16 g, Rfl: 5      Objective:   Physical Exam   Filed Vitals:   05/08/14 1436  BP: 164/78  Pulse:  76  Height: 5\' 1"  (1.549 m)  Weight: 52.617 kg (116 lb)  SpO2: 97%    hysical Exam  Constitutional: She is oriented to person, place, and time. No distress.  Frail, plesant  HENT:  Head: Normocephalic and atraumatic.  Right Ear: External ear normal.  Left Ear: External ear normal.  Mouth/Throat: Oropharynx is clear and moist. No oropharyngeal exudate.  Eyes: Conjunctivae and EOM are normal. Pupils are equal, round, and reactive to light. Right eye exhibits no discharge. Left eye exhibits no discharge. No scleral icterus.  Neck: Normal range of motion. Neck supple. No JVD present. No tracheal deviation present. No thyromegaly present.  Cardiovascular: Normal rate, regular rhythm, normal heart sounds and intact distal pulses.  Exam reveals no gallop and no friction rub.   No murmur heard. Pulmonary/Chest: Effort normal. No  respiratory distress. She has NO WHEEZES. She has no rales. She exhibits no tenderness.  Kyphotic barrel chested with significant wheezing bilaterally  Abdominal: Soft. Bowel sounds are normal. She exhibits no distension and no mass. There is no tenderness. There is no rebound and no guarding.  Musculoskeletal: She exhibits no edema or tenderness.  Sitting with a walker in front of her significant rheumatoid arthritis deformities of the hand percent  Lymphadenopathy:    She has no cervical adenopathy.  Neurological: She is alert and oriented to person, place, and time. She has normal reflexes. No cranial nerve deficit. She exhibits normal muscle tone. Coordination normal.  Skin: Skin is warm and dry. No rash noted. She is not diaphoretic. No erythema. No pallor.  Psychiatric: She has a normal mood and affect. Her behavior is normal. Judgment and thought content normal.       Assessment & Plan:     ICD-9-CM ICD-10-CM   1. Chronic cough 786.2 R05     Cough was likely related to sulfasalazine - will add to allergy list Stop BREO Continue   generic fluticasone inhaler 2 squirts each nostril daily as needed Cotninue prilosec and other medicatioins Will send note to Dr and Dr Corliss Skains  FOllowup  as needed  If cough returns after stopping Breo, call Felipa Eth [phone] and return for followup Will send note to Dr [place] and Dr Corliss Skains   (> 50% of this 15 min visit spent in face to face counseling or/and coordination of care)   Dr. Felipa Eth, M.D., [facility].C.P Pulmonary and Critical Care Medicine Staff Physician [facility] Pager: [phone], If no answer or between  15:00h - 7:00h: call [phone]  05/15/2014 3:28 AM

## 2014-05-08 NOTE — Patient Instructions (Addendum)
ICD-9-CM ICD-10-CM   1. Chronic cough 786.2 R05    Cough was likely related to sulfasalazine - will add to allergy list Stop BREO Continue   generic fluticasone inhaler 2 squirts each nostril daily as needed Cotninue prilosec and other medicatioins Will send note to Dr Corliss Skains and Dr Felipa Eth  FOllowup  as needed  If cough returns after stopping Breo, call us [phone] and return for followup Will send note to Dr Corliss Skains and Dr Felipa Eth

## 2014-06-25 ENCOUNTER — Ambulatory Visit: Payer: Medicare Other | Admitting: Cardiology

## 2014-07-02 DIAGNOSIS — E559 Vitamin D deficiency, unspecified: Secondary | ICD-10-CM | POA: Diagnosis not present

## 2014-07-02 DIAGNOSIS — I10 Essential (primary) hypertension: Secondary | ICD-10-CM | POA: Diagnosis not present

## 2014-07-02 DIAGNOSIS — M069 Rheumatoid arthritis, unspecified: Secondary | ICD-10-CM | POA: Diagnosis not present

## 2014-07-02 DIAGNOSIS — M810 Age-related osteoporosis without current pathological fracture: Secondary | ICD-10-CM | POA: Diagnosis not present

## 2014-07-02 DIAGNOSIS — E1151 Type 2 diabetes mellitus with diabetic peripheral angiopathy without gangrene: Secondary | ICD-10-CM | POA: Diagnosis not present

## 2014-07-02 DIAGNOSIS — J449 Chronic obstructive pulmonary disease, unspecified: Secondary | ICD-10-CM | POA: Diagnosis not present

## 2014-07-02 DIAGNOSIS — E039 Hypothyroidism, unspecified: Secondary | ICD-10-CM | POA: Diagnosis not present

## 2014-07-02 DIAGNOSIS — R599 Enlarged lymph nodes, unspecified: Secondary | ICD-10-CM | POA: Diagnosis not present

## 2014-07-07 DIAGNOSIS — D649 Anemia, unspecified: Secondary | ICD-10-CM | POA: Diagnosis not present

## 2014-07-23 DIAGNOSIS — M180 Bilateral primary osteoarthritis of first carpometacarpal joints: Secondary | ICD-10-CM | POA: Diagnosis not present

## 2014-07-23 DIAGNOSIS — M069 Rheumatoid arthritis, unspecified: Secondary | ICD-10-CM | POA: Diagnosis not present

## 2014-07-23 DIAGNOSIS — M79643 Pain in unspecified hand: Secondary | ICD-10-CM | POA: Diagnosis not present

## 2014-07-23 DIAGNOSIS — M25529 Pain in unspecified elbow: Secondary | ICD-10-CM | POA: Diagnosis not present

## 2014-08-13 DIAGNOSIS — Z79899 Other long term (current) drug therapy: Secondary | ICD-10-CM | POA: Diagnosis not present

## 2014-08-28 DIAGNOSIS — E1151 Type 2 diabetes mellitus with diabetic peripheral angiopathy without gangrene: Secondary | ICD-10-CM | POA: Diagnosis not present

## 2014-08-28 DIAGNOSIS — I10 Essential (primary) hypertension: Secondary | ICD-10-CM | POA: Diagnosis not present

## 2014-08-28 DIAGNOSIS — R0600 Dyspnea, unspecified: Secondary | ICD-10-CM | POA: Diagnosis not present

## 2014-08-28 DIAGNOSIS — J449 Chronic obstructive pulmonary disease, unspecified: Secondary | ICD-10-CM | POA: Diagnosis not present

## 2014-08-28 DIAGNOSIS — E039 Hypothyroidism, unspecified: Secondary | ICD-10-CM | POA: Diagnosis not present

## 2014-08-28 DIAGNOSIS — D509 Iron deficiency anemia, unspecified: Secondary | ICD-10-CM | POA: Diagnosis not present

## 2014-08-28 DIAGNOSIS — Z79899 Other long term (current) drug therapy: Secondary | ICD-10-CM | POA: Diagnosis not present

## 2014-08-28 DIAGNOSIS — D649 Anemia, unspecified: Secondary | ICD-10-CM | POA: Diagnosis not present

## 2014-09-01 DIAGNOSIS — R911 Solitary pulmonary nodule: Secondary | ICD-10-CM | POA: Diagnosis not present

## 2014-09-01 DIAGNOSIS — J984 Other disorders of lung: Secondary | ICD-10-CM | POA: Diagnosis not present

## 2014-09-09 DIAGNOSIS — N390 Urinary tract infection, site not specified: Secondary | ICD-10-CM | POA: Diagnosis not present

## 2014-09-09 DIAGNOSIS — R8299 Other abnormal findings in urine: Secondary | ICD-10-CM | POA: Diagnosis not present

## 2014-09-10 ENCOUNTER — Ambulatory Visit: Payer: Self-pay | Admitting: *Deleted

## 2014-09-10 DIAGNOSIS — I82519 Chronic embolism and thrombosis of unspecified femoral vein: Secondary | ICD-10-CM

## 2014-10-24 DIAGNOSIS — Z23 Encounter for immunization: Secondary | ICD-10-CM | POA: Diagnosis not present

## 2014-11-18 DIAGNOSIS — Z961 Presence of intraocular lens: Secondary | ICD-10-CM | POA: Diagnosis not present

## 2014-11-18 DIAGNOSIS — M0589 Other rheumatoid arthritis with rheumatoid factor of multiple sites: Secondary | ICD-10-CM | POA: Diagnosis not present

## 2014-11-18 DIAGNOSIS — Z049 Encounter for examination and observation for unspecified reason: Secondary | ICD-10-CM | POA: Diagnosis not present

## 2014-11-18 DIAGNOSIS — Z09 Encounter for follow-up examination after completed treatment for conditions other than malignant neoplasm: Secondary | ICD-10-CM | POA: Diagnosis not present

## 2014-11-18 DIAGNOSIS — H26491 Other secondary cataract, right eye: Secondary | ICD-10-CM | POA: Diagnosis not present

## 2014-11-18 DIAGNOSIS — E119 Type 2 diabetes mellitus without complications: Secondary | ICD-10-CM | POA: Diagnosis not present

## 2014-12-31 DIAGNOSIS — R531 Weakness: Secondary | ICD-10-CM | POA: Diagnosis not present

## 2015-01-04 DIAGNOSIS — R531 Weakness: Secondary | ICD-10-CM | POA: Diagnosis not present

## 2015-01-05 DIAGNOSIS — F339 Major depressive disorder, recurrent, unspecified: Secondary | ICD-10-CM | POA: Diagnosis not present

## 2015-01-05 DIAGNOSIS — J449 Chronic obstructive pulmonary disease, unspecified: Secondary | ICD-10-CM | POA: Diagnosis not present

## 2015-01-05 DIAGNOSIS — I2510 Atherosclerotic heart disease of native coronary artery without angina pectoris: Secondary | ICD-10-CM | POA: Diagnosis not present

## 2015-01-05 DIAGNOSIS — K219 Gastro-esophageal reflux disease without esophagitis: Secondary | ICD-10-CM | POA: Diagnosis not present

## 2015-01-05 DIAGNOSIS — M069 Rheumatoid arthritis, unspecified: Secondary | ICD-10-CM | POA: Diagnosis not present

## 2015-01-06 DIAGNOSIS — J449 Chronic obstructive pulmonary disease, unspecified: Secondary | ICD-10-CM | POA: Diagnosis not present

## 2015-01-06 DIAGNOSIS — I2510 Atherosclerotic heart disease of native coronary artery without angina pectoris: Secondary | ICD-10-CM | POA: Diagnosis not present

## 2015-01-06 DIAGNOSIS — F339 Major depressive disorder, recurrent, unspecified: Secondary | ICD-10-CM | POA: Diagnosis not present

## 2015-01-06 DIAGNOSIS — M069 Rheumatoid arthritis, unspecified: Secondary | ICD-10-CM | POA: Diagnosis not present

## 2015-01-06 DIAGNOSIS — K219 Gastro-esophageal reflux disease without esophagitis: Secondary | ICD-10-CM | POA: Diagnosis not present

## 2015-01-08 DIAGNOSIS — J449 Chronic obstructive pulmonary disease, unspecified: Secondary | ICD-10-CM | POA: Diagnosis not present

## 2015-01-08 DIAGNOSIS — K219 Gastro-esophageal reflux disease without esophagitis: Secondary | ICD-10-CM | POA: Diagnosis not present

## 2015-01-08 DIAGNOSIS — I2510 Atherosclerotic heart disease of native coronary artery without angina pectoris: Secondary | ICD-10-CM | POA: Diagnosis not present

## 2015-01-08 DIAGNOSIS — F339 Major depressive disorder, recurrent, unspecified: Secondary | ICD-10-CM | POA: Diagnosis not present

## 2015-01-08 DIAGNOSIS — M069 Rheumatoid arthritis, unspecified: Secondary | ICD-10-CM | POA: Diagnosis not present

## 2015-01-11 DIAGNOSIS — M069 Rheumatoid arthritis, unspecified: Secondary | ICD-10-CM | POA: Diagnosis not present

## 2015-01-11 DIAGNOSIS — K219 Gastro-esophageal reflux disease without esophagitis: Secondary | ICD-10-CM | POA: Diagnosis not present

## 2015-01-11 DIAGNOSIS — J449 Chronic obstructive pulmonary disease, unspecified: Secondary | ICD-10-CM | POA: Diagnosis not present

## 2015-01-11 DIAGNOSIS — F339 Major depressive disorder, recurrent, unspecified: Secondary | ICD-10-CM | POA: Diagnosis not present

## 2015-01-11 DIAGNOSIS — I2510 Atherosclerotic heart disease of native coronary artery without angina pectoris: Secondary | ICD-10-CM | POA: Diagnosis not present

## 2015-01-17 DIAGNOSIS — J449 Chronic obstructive pulmonary disease, unspecified: Secondary | ICD-10-CM | POA: Diagnosis not present

## 2015-01-17 DIAGNOSIS — F339 Major depressive disorder, recurrent, unspecified: Secondary | ICD-10-CM | POA: Diagnosis not present

## 2015-01-17 DIAGNOSIS — M069 Rheumatoid arthritis, unspecified: Secondary | ICD-10-CM | POA: Diagnosis not present

## 2015-01-17 DIAGNOSIS — K219 Gastro-esophageal reflux disease without esophagitis: Secondary | ICD-10-CM | POA: Diagnosis not present

## 2015-01-17 DIAGNOSIS — I2510 Atherosclerotic heart disease of native coronary artery without angina pectoris: Secondary | ICD-10-CM | POA: Diagnosis not present

## 2015-01-18 DIAGNOSIS — J449 Chronic obstructive pulmonary disease, unspecified: Secondary | ICD-10-CM | POA: Diagnosis not present

## 2015-01-18 DIAGNOSIS — F339 Major depressive disorder, recurrent, unspecified: Secondary | ICD-10-CM | POA: Diagnosis not present

## 2015-01-18 DIAGNOSIS — K219 Gastro-esophageal reflux disease without esophagitis: Secondary | ICD-10-CM | POA: Diagnosis not present

## 2015-01-18 DIAGNOSIS — I2510 Atherosclerotic heart disease of native coronary artery without angina pectoris: Secondary | ICD-10-CM | POA: Diagnosis not present

## 2015-01-18 DIAGNOSIS — M069 Rheumatoid arthritis, unspecified: Secondary | ICD-10-CM | POA: Diagnosis not present

## 2015-01-19 DIAGNOSIS — F339 Major depressive disorder, recurrent, unspecified: Secondary | ICD-10-CM | POA: Diagnosis not present

## 2015-01-19 DIAGNOSIS — J449 Chronic obstructive pulmonary disease, unspecified: Secondary | ICD-10-CM | POA: Diagnosis not present

## 2015-01-19 DIAGNOSIS — I2510 Atherosclerotic heart disease of native coronary artery without angina pectoris: Secondary | ICD-10-CM | POA: Diagnosis not present

## 2015-01-19 DIAGNOSIS — K219 Gastro-esophageal reflux disease without esophagitis: Secondary | ICD-10-CM | POA: Diagnosis not present

## 2015-01-19 DIAGNOSIS — M069 Rheumatoid arthritis, unspecified: Secondary | ICD-10-CM | POA: Diagnosis not present

## 2015-01-20 DIAGNOSIS — I2510 Atherosclerotic heart disease of native coronary artery without angina pectoris: Secondary | ICD-10-CM | POA: Diagnosis not present

## 2015-01-20 DIAGNOSIS — J449 Chronic obstructive pulmonary disease, unspecified: Secondary | ICD-10-CM | POA: Diagnosis not present

## 2015-01-20 DIAGNOSIS — M069 Rheumatoid arthritis, unspecified: Secondary | ICD-10-CM | POA: Diagnosis not present

## 2015-01-20 DIAGNOSIS — K219 Gastro-esophageal reflux disease without esophagitis: Secondary | ICD-10-CM | POA: Diagnosis not present

## 2015-01-20 DIAGNOSIS — F339 Major depressive disorder, recurrent, unspecified: Secondary | ICD-10-CM | POA: Diagnosis not present

## 2015-01-21 DIAGNOSIS — J449 Chronic obstructive pulmonary disease, unspecified: Secondary | ICD-10-CM | POA: Diagnosis not present

## 2015-01-21 DIAGNOSIS — M069 Rheumatoid arthritis, unspecified: Secondary | ICD-10-CM | POA: Diagnosis not present

## 2015-01-21 DIAGNOSIS — K219 Gastro-esophageal reflux disease without esophagitis: Secondary | ICD-10-CM | POA: Diagnosis not present

## 2015-01-21 DIAGNOSIS — F339 Major depressive disorder, recurrent, unspecified: Secondary | ICD-10-CM | POA: Diagnosis not present

## 2015-01-21 DIAGNOSIS — I2510 Atherosclerotic heart disease of native coronary artery without angina pectoris: Secondary | ICD-10-CM | POA: Diagnosis not present

## 2015-02-21 DIAGNOSIS — M069 Rheumatoid arthritis, unspecified: Secondary | ICD-10-CM | POA: Diagnosis not present

## 2015-02-21 DIAGNOSIS — J449 Chronic obstructive pulmonary disease, unspecified: Secondary | ICD-10-CM | POA: Diagnosis not present

## 2015-02-21 DIAGNOSIS — K219 Gastro-esophageal reflux disease without esophagitis: Secondary | ICD-10-CM | POA: Diagnosis not present

## 2015-02-21 DIAGNOSIS — F339 Major depressive disorder, recurrent, unspecified: Secondary | ICD-10-CM | POA: Diagnosis not present

## 2015-02-21 DIAGNOSIS — I2510 Atherosclerotic heart disease of native coronary artery without angina pectoris: Secondary | ICD-10-CM | POA: Diagnosis not present

## 2015-03-25 DIAGNOSIS — W19XXXA Unspecified fall, initial encounter: Secondary | ICD-10-CM | POA: Diagnosis not present

## 2015-03-25 DIAGNOSIS — I2510 Atherosclerotic heart disease of native coronary artery without angina pectoris: Secondary | ICD-10-CM | POA: Diagnosis not present

## 2015-03-25 DIAGNOSIS — E038 Other specified hypothyroidism: Secondary | ICD-10-CM | POA: Diagnosis not present

## 2015-03-25 DIAGNOSIS — J449 Chronic obstructive pulmonary disease, unspecified: Secondary | ICD-10-CM | POA: Diagnosis not present

## 2015-03-25 DIAGNOSIS — E559 Vitamin D deficiency, unspecified: Secondary | ICD-10-CM | POA: Diagnosis not present

## 2015-03-25 DIAGNOSIS — D508 Other iron deficiency anemias: Secondary | ICD-10-CM | POA: Diagnosis not present

## 2015-03-25 DIAGNOSIS — R0602 Shortness of breath: Secondary | ICD-10-CM | POA: Diagnosis not present

## 2015-03-25 DIAGNOSIS — M0689 Other specified rheumatoid arthritis, multiple sites: Secondary | ICD-10-CM | POA: Diagnosis not present

## 2015-03-25 DIAGNOSIS — D692 Other nonthrombocytopenic purpura: Secondary | ICD-10-CM | POA: Diagnosis not present

## 2015-03-25 DIAGNOSIS — E1151 Type 2 diabetes mellitus with diabetic peripheral angiopathy without gangrene: Secondary | ICD-10-CM | POA: Diagnosis not present

## 2015-04-07 DIAGNOSIS — R404 Transient alteration of awareness: Secondary | ICD-10-CM | POA: Diagnosis not present

## 2015-04-13 DIAGNOSIS — R269 Unspecified abnormalities of gait and mobility: Secondary | ICD-10-CM | POA: Diagnosis not present

## 2015-06-29 DIAGNOSIS — R269 Unspecified abnormalities of gait and mobility: Secondary | ICD-10-CM | POA: Diagnosis not present

## 2015-07-20 DIAGNOSIS — R269 Unspecified abnormalities of gait and mobility: Secondary | ICD-10-CM | POA: Diagnosis not present

## 2015-07-27 DIAGNOSIS — R269 Unspecified abnormalities of gait and mobility: Secondary | ICD-10-CM | POA: Diagnosis not present

## 2015-08-27 ENCOUNTER — Emergency Department (HOSPITAL_COMMUNITY): Payer: Medicare Other

## 2015-08-27 ENCOUNTER — Encounter (HOSPITAL_COMMUNITY): Payer: Self-pay | Admitting: Emergency Medicine

## 2015-08-27 ENCOUNTER — Inpatient Hospital Stay (HOSPITAL_COMMUNITY)
Admission: EM | Admit: 2015-08-27 | Discharge: 2015-08-31 | DRG: 190 | Disposition: A | Discharge status: Hospice | Payer: Medicare Other | Attending: Internal Medicine | Admitting: Internal Medicine

## 2015-08-27 DIAGNOSIS — K5900 Constipation, unspecified: Secondary | ICD-10-CM | POA: Diagnosis present

## 2015-08-27 DIAGNOSIS — Z681 Body mass index (BMI) 19 or less, adult: Secondary | ICD-10-CM | POA: Diagnosis not present

## 2015-08-27 DIAGNOSIS — I110 Hypertensive heart disease with heart failure: Secondary | ICD-10-CM | POA: Diagnosis not present

## 2015-08-27 DIAGNOSIS — Z9861 Coronary angioplasty status: Secondary | ICD-10-CM

## 2015-08-27 DIAGNOSIS — M069 Rheumatoid arthritis, unspecified: Secondary | ICD-10-CM | POA: Diagnosis present

## 2015-08-27 DIAGNOSIS — I2510 Atherosclerotic heart disease of native coronary artery without angina pectoris: Secondary | ICD-10-CM | POA: Diagnosis not present

## 2015-08-27 DIAGNOSIS — R0602 Shortness of breath: Secondary | ICD-10-CM | POA: Diagnosis not present

## 2015-08-27 DIAGNOSIS — Z7982 Long term (current) use of aspirin: Secondary | ICD-10-CM

## 2015-08-27 DIAGNOSIS — E1165 Type 2 diabetes mellitus with hyperglycemia: Secondary | ICD-10-CM | POA: Diagnosis present

## 2015-08-27 DIAGNOSIS — Z79899 Other long term (current) drug therapy: Secondary | ICD-10-CM

## 2015-08-27 DIAGNOSIS — Z833 Family history of diabetes mellitus: Secondary | ICD-10-CM

## 2015-08-27 DIAGNOSIS — I5033 Acute on chronic diastolic (congestive) heart failure: Secondary | ICD-10-CM | POA: Diagnosis not present

## 2015-08-27 DIAGNOSIS — R079 Chest pain, unspecified: Secondary | ICD-10-CM | POA: Diagnosis present

## 2015-08-27 DIAGNOSIS — Z515 Encounter for palliative care: Secondary | ICD-10-CM | POA: Diagnosis present

## 2015-08-27 DIAGNOSIS — Z90710 Acquired absence of both cervix and uterus: Secondary | ICD-10-CM

## 2015-08-27 DIAGNOSIS — R0600 Dyspnea, unspecified: Secondary | ICD-10-CM

## 2015-08-27 DIAGNOSIS — R7989 Other specified abnormal findings of blood chemistry: Secondary | ICD-10-CM

## 2015-08-27 DIAGNOSIS — Z7901 Long term (current) use of anticoagulants: Secondary | ICD-10-CM

## 2015-08-27 DIAGNOSIS — Z96611 Presence of right artificial shoulder joint: Secondary | ICD-10-CM | POA: Diagnosis present

## 2015-08-27 DIAGNOSIS — E46 Unspecified protein-calorie malnutrition: Secondary | ICD-10-CM | POA: Diagnosis not present

## 2015-08-27 DIAGNOSIS — Z9049 Acquired absence of other specified parts of digestive tract: Secondary | ICD-10-CM

## 2015-08-27 DIAGNOSIS — I82519 Chronic embolism and thrombosis of unspecified femoral vein: Secondary | ICD-10-CM

## 2015-08-27 DIAGNOSIS — I10 Essential (primary) hypertension: Secondary | ICD-10-CM | POA: Diagnosis present

## 2015-08-27 DIAGNOSIS — J441 Chronic obstructive pulmonary disease with (acute) exacerbation: Secondary | ICD-10-CM | POA: Diagnosis present

## 2015-08-27 DIAGNOSIS — Z87891 Personal history of nicotine dependence: Secondary | ICD-10-CM

## 2015-08-27 DIAGNOSIS — Z8673 Personal history of transient ischemic attack (TIA), and cerebral infarction without residual deficits: Secondary | ICD-10-CM

## 2015-08-27 DIAGNOSIS — R778 Other specified abnormalities of plasma proteins: Secondary | ICD-10-CM

## 2015-08-27 DIAGNOSIS — I739 Peripheral vascular disease, unspecified: Secondary | ICD-10-CM

## 2015-08-27 DIAGNOSIS — Z86718 Personal history of other venous thrombosis and embolism: Secondary | ICD-10-CM

## 2015-08-27 DIAGNOSIS — Z7189 Other specified counseling: Secondary | ICD-10-CM | POA: Insufficient documentation

## 2015-08-27 DIAGNOSIS — Z96642 Presence of left artificial hip joint: Secondary | ICD-10-CM | POA: Diagnosis present

## 2015-08-27 DIAGNOSIS — R296 Repeated falls: Secondary | ICD-10-CM | POA: Diagnosis present

## 2015-08-27 DIAGNOSIS — I248 Other forms of acute ischemic heart disease: Secondary | ICD-10-CM | POA: Diagnosis present

## 2015-08-27 DIAGNOSIS — Z66 Do not resuscitate: Secondary | ICD-10-CM | POA: Diagnosis present

## 2015-08-27 DIAGNOSIS — E118 Type 2 diabetes mellitus with unspecified complications: Secondary | ICD-10-CM | POA: Diagnosis not present

## 2015-08-27 DIAGNOSIS — E785 Hyperlipidemia, unspecified: Secondary | ICD-10-CM | POA: Diagnosis present

## 2015-08-27 DIAGNOSIS — Z955 Presence of coronary angioplasty implant and graft: Secondary | ICD-10-CM

## 2015-08-27 DIAGNOSIS — Z8679 Personal history of other diseases of the circulatory system: Secondary | ICD-10-CM

## 2015-08-27 DIAGNOSIS — Z8249 Family history of ischemic heart disease and other diseases of the circulatory system: Secondary | ICD-10-CM

## 2015-08-27 DIAGNOSIS — M7989 Other specified soft tissue disorders: Secondary | ICD-10-CM

## 2015-08-27 DIAGNOSIS — IMO0002 Reserved for concepts with insufficient information to code with codable children: Secondary | ICD-10-CM | POA: Diagnosis present

## 2015-08-27 LAB — CBC WITH DIFFERENTIAL/PLATELET
BASOS ABS: 0 10*3/uL (ref 0.0–0.1)
BASOS PCT: 0 %
Eosinophils Absolute: 0 10*3/uL (ref 0.0–0.7)
Eosinophils Relative: 0 %
HEMATOCRIT: 34.8 % — AB (ref 36.0–46.0)
Hemoglobin: 11.6 g/dL — ABNORMAL LOW (ref 12.0–15.0)
Lymphocytes Relative: 8 %
Lymphs Abs: 0.5 10*3/uL — ABNORMAL LOW (ref 0.7–4.0)
MCH: 31.4 pg (ref 26.0–34.0)
MCHC: 33.3 g/dL (ref 30.0–36.0)
MCV: 94.3 fL (ref 78.0–100.0)
MONO ABS: 0.3 10*3/uL (ref 0.1–1.0)
Monocytes Relative: 5 %
NEUTROS ABS: 5.2 10*3/uL (ref 1.7–7.7)
Neutrophils Relative %: 87 %
PLATELETS: 184 10*3/uL (ref 150–400)
RBC: 3.69 MIL/uL — AB (ref 3.87–5.11)
RDW: 13.8 % (ref 11.5–15.5)
WBC: 6 10*3/uL (ref 4.0–10.5)

## 2015-08-27 LAB — BASIC METABOLIC PANEL
ANION GAP: 8 (ref 5–15)
BUN: 10 mg/dL (ref 6–20)
CALCIUM: 8.5 mg/dL — AB (ref 8.9–10.3)
CO2: 25 mmol/L (ref 22–32)
Chloride: 103 mmol/L (ref 101–111)
Creatinine, Ser: 0.79 mg/dL (ref 0.44–1.00)
Glucose, Bld: 389 mg/dL — ABNORMAL HIGH (ref 65–99)
POTASSIUM: 3.9 mmol/L (ref 3.5–5.1)
Sodium: 136 mmol/L (ref 135–145)

## 2015-08-27 LAB — BRAIN NATRIURETIC PEPTIDE: B Natriuretic Peptide: 157.1 pg/mL — ABNORMAL HIGH (ref 0.0–100.0)

## 2015-08-27 LAB — TROPONIN I: Troponin I: 0.03 ng/mL (ref <0.03)

## 2015-08-27 LAB — D-DIMER, QUANTITATIVE: D-Dimer, Quant: 3.98 ug/mL-FEU — ABNORMAL HIGH (ref 0.00–0.50)

## 2015-08-27 MED ORDER — ALBUTEROL SULFATE (2.5 MG/3ML) 0.083% IN NEBU
5.0000 mg | INHALATION_SOLUTION | Freq: Once | RESPIRATORY_TRACT | Status: AC
  Administered 2015-08-27: 5 mg via RESPIRATORY_TRACT
  Filled 2015-08-27: qty 6

## 2015-08-27 MED ORDER — IOPAMIDOL (ISOVUE-370) INJECTION 76%
100.0000 mL | Freq: Once | INTRAVENOUS | Status: AC | PRN
  Administered 2015-08-27: 65 mL via INTRAVENOUS

## 2015-08-27 MED ORDER — SODIUM CHLORIDE 0.9 % IV SOLN
INTRAVENOUS | Status: DC
Start: 2015-08-27 — End: 2015-08-28
  Administered 2015-08-27: 20:00:00 via INTRAVENOUS

## 2015-08-27 MED ORDER — ALBUTEROL (5 MG/ML) CONTINUOUS INHALATION SOLN
10.0000 mg/h | INHALATION_SOLUTION | Freq: Once | RESPIRATORY_TRACT | Status: AC
  Administered 2015-08-27: 10 mg/h via RESPIRATORY_TRACT
  Filled 2015-08-27: qty 20

## 2015-08-27 MED ORDER — DIPHENHYDRAMINE HCL 50 MG/ML IJ SOLN
INTRAMUSCULAR | Status: AC
  Administered 2015-08-27: 12.5 mg
  Filled 2015-08-27: qty 1

## 2015-08-27 MED ORDER — METHYLPREDNISOLONE SODIUM SUCC 125 MG IJ SOLR
125.0000 mg | Freq: Once | INTRAMUSCULAR | Status: AC
  Administered 2015-08-27: 125 mg via INTRAVENOUS
  Filled 2015-08-27: qty 2

## 2015-08-27 NOTE — H&P (Addendum)
[name]:[id number] DOB: 1931-01-18 DOA: 08/27/2015     PCP: Hoyle Sauer, MD   Outpatient Specialists: Cardiology Herbie [place], Pulmonology Ramaswamy, vascular surgeon Early  Patient coming from:   home Lives With family    Chief Complaint: Shortness of breath  HPI: [name] is a 82 y.o. female with medical history significant of DM 2, COPD, frequent falls, rheumatoid arthritis, coronary artery disease history of recurrent DVT no longer on anticoagulation secondary to falls. PAD that is post right iliac stenting, AAA status post repair, HTN, CVA, Barrett esophagus    Presented with worsening shortness of breath compared to baseline. She's been having worsening abdominal distention. And also worsening leg edema she is currently receiving palliative care. She had not endorse any fevers or chills. She reports  while in ED she had some  chest pain it was substernal and lasted a few min.  she's been taking all her medications and recently had not had any changes to them. She was seen today by home health nurse was sent to emergency department. Patient reports some weight gain. She reports some swelling in her legs bilaterally and in her abdomen in the past 3 days.   Regarding pertinent Chronic problems: Regarding history of COPD patient had had a stress test and every 2016 which was "good" Regarding COPD she's been followed by pulmonology Asked echogram was done in 2016 showed preserved EF and pulmonary pressures   IN ER: Afebrile heart rate 109, respirations 24 blood pressure 165/65 setting 97% WBC 6.0 hemoglobin 11.6 potassium 3.9 point slightly elevated 0.03 d-dimer was elevated 3.98 BNP 157 Glucose 389  CT of the chest showing no PE and no infiltrate She was given nebulizer with albuterol times 2 and Solu-Medrol 125  Hospitalist was called for admission for COPD exacerbation and mild troponin elevation  Review of Systems:    Pertinent positives include:  fatigue,shortness of breath at rest. dyspnea on exertion,  Constitutional:  No weight loss, night sweats, Fevers, chills,  weight loss  HEENT:  No headaches, Difficulty swallowing,Tooth/dental problems,Sore throat,  No sneezing, itching, ear ache, nasal congestion, post nasal drip,  Cardio-vascular:  No chest pain, Orthopnea, PND, anasarca, dizziness, palpitations.no Bilateral lower extremity swelling  GI:  No heartburn, indigestion, abdominal pain, nausea, vomiting, diarrhea, change in bowel habits, loss of appetite, melena, blood in stool, hematemesis Resp:    No excess mucus, no productive cough, No non-productive cough, No coughing up of blood.No change in color of mucus.No wheezing. Skin:  no rash or lesions. No jaundice GU:  no dysuria, change in color of urine, no urgency or frequency. No straining to urinate.  No flank pain.  Musculoskeletal:  No joint pain or no joint swelling. No decreased range of motion. No back pain.  Psych:  No change in mood or affect. No depression or anxiety. No memory loss.  Neuro: no localizing neurological complaints, no tingling, no weakness, no double vision, no gait abnormality, no slurred speech, no confusion  As per HPI otherwise 10 point review of systems negative.   Past Medical History: Past Medical History  Diagnosis Date  . History of  Angina     Treated with PCI in 2004; no active Symptoms.  . CAD S/P percutaneous coronary angioplasty 2004    PCI-RCA (2.47mm x 52mm Taxus DES)& Cx (2.75 mm x 14 mm Cypher DES)  . PAD (peripheral artery disease) (HCC) 1994    History of right iliac stenting, fem-fem bypass in 94; aortobifem bypass/  AAA repair - November 2004; latest ABIs of been roughly 1 bilaterally, followed by Dr. Arbie Cookey  . Essential hypertension     Labile  . Hyperlipidemia LDL goal <70     Statin intolerant  . History of ischemic stroke without residual deficits     Diagnosed by Radiographic Imaging  . DVT, femoral, chronic  (HCC)     Bilateral -- was on chronic Warfarin until fall with subdural hematoma in 2015  . COPD mixed type [facility]) CT Chest 01/2014    Former Smoker; Centrilobular emphysema with bronchiectasis  . Chronic pneumonia (HCC)     Several "Bouts"  . Shortness of breath     Chronic MILD- WITH EXERTION. chronic  . Aortic atherosclerosis (HCC)     Noted on CT scan. Also with aortobifem and AAA repair  . Bilateral edema of lower extremity   . Gastroesophageal reflux disease with hiatal hernia   . Barrett esophagus   . Osteoarthritis of both hips      status post multiple hip operations, recent fall with fracture.    [name] DJD (degenerative joint disease)   . Rheumatoid arthritis (HCC)   . Fall from slip, trip, or stumble 08/20/2013  . Closed hip fracture (HCC)     Right  . Neuropathy (HCC)   . Iron deficiency anemia   . Osteoporosis   . Shoulder pain, right     RIGHT SHOULDER WITH LIMITED ROM--STATES PREVIOUS RT TOTAL SHOULDDER REPLACEMENT-BUT STILL HAS PROBLEMS WITH SHOULDER  . History of blood transfusion   . H/O migraine     "used to have them; not anymore" (08/22/2013)   Past Surgical History  Procedure Laterality Date  . Back surgery      x 2  . Appendectomy  1950  . Total shoulder replacement Right 2011  . Cholecystectomy      several yrs ago  . Cataract extraction w/ intraocular lens  implant, bilateral Bilateral 2001-2002  . Total abdominal hysterectomy  1965  . Upper gastrointestinal endoscopy  multiple    w/bx, hiatal hernia, Barretts', esophageal polyp 1x  . Colonoscopy  05/09/2004; 06/08/1999    internal hemorrhoids; diverticulosis  . Shoulder open rotator cuff repair Bilateral     2 times on left  . Lumbar spine surgery  October 2012    spinal stenosis, Dr. Ophelia Charter  . Abdominal aortic aneurysm repair  2004    AortoBi-Fem Bypass  . Carpal tunnel release Left 04/2011  . Total hip arthroplasty  01/26/2012    Procedure: TOTAL HIP ARTHROPLASTY ANTERIOR APPROACH;  Surgeon:  Kathryne Hitch, MD;  Location: [facility];  Service: Orthopedics;  Laterality: Left;  Left Total Hip Arthroplasty, Anterior Approach (C-Arm)  . Intramedullary (im) nail intertrochanteric Right 04/12/2012    Procedure: INTRAMEDULLARY (IM) NAIL INTERTROCHANTRIC Right;  Surgeon: Kathryne Hitch, MD;  Location: [facility] OR;  Service: Orthopedics;  Laterality: Right;  . Doppler echocardiography  06/06/2012    EF 50-60%.  Mild LVH, grade one diastolic function; normal PA pressures and CVP  . Lower extremity venous dopplers  08/16/2012    Partially occlusive right femoral vein thrombosis, also noted in right peroneal vein; no thrombosis in left sided veins.  . Nm myoview ltd  April 2014    Bowel artifact, No ischemia or infarction  . Femoral-femoral bypass graft  1994    Following right iliac stenting  . Doppler echocardiography  06/06/2012    Mild LVH, EF 55-60%.  Normal wall motion.  Mildly elevated LVEDP with grade  one diastolic function.  Mild LA dilatation.  Normal PA pressures.  . Tubal ligation  1956  . Excision/release bursa hip Right 12/27/2012    Procedure: RIGHT HIP BURSECTOMY AND COMPRESSION SCREW REMOVAL;  Surgeon: Kathryne Hitch, MD;  Location: [facility];  Service: Orthopedics;  Laterality: Right;  . Joint replacement    . Coronary angioplasty with stent placement  2004    RCA: Taxus DES 2.5 mm x 12 mm; Circumflex: Cypher DES 2.75 mm 14 mm  . Cardiac catheterization  October 2005    Patent RCA stent, 20% ISR of Cx stent; EF 50-55%  . Lexiscan cardiolite nuclear stress test       Social History:  Ambulatory  walker      reports that she quit smoking about 15 years ago. Her smoking use included Cigarettes. She has a 50 pack-year smoking history. She has never used smokeless tobacco. She reports that she does not drink alcohol or use illicit drugs.  Allergies:   Allergies  Allergen Reactions  . Codeine Nausea And Vomiting, Swelling and Rash  . Hydrocodone Nausea And  Vomiting, Swelling and Rash    "mouth peeled"  . Morphine And Related Other (See Comments)    hallucinations  . Other Nausea And Vomiting    Most pain meds! other than tramadol. Tylenol, Toradol, and Robaxin are ok.    . Penicillins Hives    Has patient had a PCN reaction causing immediate rash, facial/tongue/throat swelling, SOB or lightheadedness with hypotension:No Has patient had a PCN reaction causing severe rash involving mucus membranes or skin necrosis:YES Has patient had a PCN reaction that required hospitalization:NO Has patient had a PCN reaction occurring within the last 10 years:NO If all of the above answers are "NO", then may proceed with Cephalosporin use.   . Demerol [Meperidine] Nausea And Vomiting  . Propoxyphene Hives and Nausea And Vomiting  . Statins Other (See Comments)    INTOLERANT OF STATINS - messes up her legs and has muscle aches  . Sulfasalazine   . Tape Other (See Comments)    Needs paper tape - skin very fragile     Family History:    Family History  Problem Relation Age of Onset  . Prostate cancer Father   . Diabetes Mother   . Colon cancer Neg Hx   . Breast cancer Mother   . Heart disease Mother     Medications: Prior to Admission medications   Medication Sig Start Date End Date Taking? Authorizing Provider  albuterol (PROVENTIL HFA;VENTOLIN HFA) 108 (90 BASE) MCG/ACT inhaler Inhale 2 puffs into the lungs every 6 (six) hours as needed for wheezing or shortness of breath.   Yes Historical Provider, MD  aspirin EC 81 MG tablet Take 81 mg by mouth every other day.   Yes Historical Provider, MD  Camphor-Menthol-Methyl Sal (SALONPAS) 1.2-5.7-6.3 % PTCH Place 1 patch onto the skin daily as needed (For pain.).   Yes Historical Provider, MD  docusate sodium (COLACE) 100 MG capsule Take 100 mg by mouth daily.    Yes Historical Provider, MD  escitalopram (LEXAPRO) 5 MG tablet Take 5 mg by mouth every evening.    Yes Historical Provider, MD    guaiFENesin (MUCINEX) 600 MG 12 hr tablet Take 600 mg by mouth daily at 12 noon.   Yes Historical Provider, MD  hydroxychloroquine (PLAQUENIL) 200 MG tablet Take 300 mg by mouth daily.    Yes Historical Provider, MD  hydroxypropyl methylcellulose / hypromellose (ISOPTO TEARS /  GONIOVISC) 2.5 % ophthalmic solution Place 1-2 drops into both eyes 3 (three) times daily as needed for dry eyes.   Yes Historical Provider, MD  ipratropium-albuterol (DUONEB) 0.5-2.5 (3) MG/3ML SOLN Take 3 mLs by nebulization 3 (three) times daily.   Yes Historical Provider, MD  isosorbide mononitrate (IMDUR) 30 MG 24 hr tablet Take 1 tablet (30 mg total) by mouth daily. 04/15/14  Yes Marykay Lex, MD  Multiple Vitamin (MULTIVITAMIN WITH MINERALS) TABS tablet Take 1 tablet by mouth daily at 12 noon.   Yes Historical Provider, MD  omeprazole (PRILOSEC OTC) 20 MG tablet Take 20 mg by mouth every morning.    Yes Historical Provider, MD  predniSONE (DELTASONE) 10 MG tablet Take 10 mg by mouth daily.   Yes Historical Provider, MD  promethazine (PHENERGAN) 12.5 MG tablet Take 12.5 mg by mouth every 6 (six) hours as needed for nausea (Patient takes with Tramadol).   Yes Historical Provider, MD  traMADol (ULTRAM) 50 MG tablet Take 1 tablet (50 mg total) by mouth every 6 (six) hours as needed for moderate pain. 12/15/13  Yes Christiane Ha, MD  fluticasone (FLONASE) 50 MCG/ACT nasal spray Place 2 sprays into both nostrils daily. Patient not taking: Reported on 05/08/2014 04/16/14   Kalman Shan, MD  Fluticasone Furoate-Vilanterol (BREO ELLIPTA) 100-25 MCG/INH AEPB Inhale 1 puff into the lungs daily. Patient not taking: Reported on 08/27/2015 04/16/14   Kalman Shan, MD  lactose free nutrition (BOOST PLUS) LIQD Take 237 mLs by mouth 2 (two) times daily between meals as needed (As requested by pt or family). Patient not taking: Reported on 08/27/2015 12/10/13   Nishant Dhungel, MD  polyethylene glycol (MIRALAX / GLYCOLAX) packet  Take 17 g by mouth daily. Patient taking differently: Take 17 g by mouth daily as needed.  12/06/13   Rhetta Mura, MD    Physical Exam: Patient Vitals for the past 24 hrs:  BP Temp Temp src Pulse Resp SpO2  08/27/15 2300 165/65 mmHg - - 107 24 97 %  08/27/15 2200 146/71 mmHg - - 107 - 100 %  08/27/15 2119 - 98.4 F (36.9 C) Oral - - -  08/27/15 2059 171/69 mmHg - - 109 20 100 %  08/27/15 2007 - - - 89 22 100 %  08/27/15 1848 139/96 mmHg 98.5 F (36.9 C) Oral 86 20 97 %    1. General:  in No Acute distress 2. Psychological: Alert and  Oriented 3. Head/ENT:   Moist   Mucous Membranes                          Head Non traumatic, neck supple                            Poor Dentition 4. SKIN: normal  Skin turgor,  Skin clean Dry and intact no rash 5. Heart: Regular rate and rhythm no  Murmur, Rub or gallop 6. Lungs: some mild wheezes no crackles   7. Abdomen: Soft, non-tender, Non distended 8. Lower extremities: no clubbing, cyanosis, or edema 9. Neurologically Grossly intact, moving all 4 extremities equally 10. MSK: Normal range of motion   body mass index is unknown because there is no weight on file.  Labs on Admission:   Labs on Admission: I have personally reviewed following labs and imaging studies  CBC:  Recent Labs Lab 08/27/15 2008  WBC 6.0  NEUTROABS 5.2  HGB 11.6*  HCT 34.8*  MCV 94.3  PLT 184   Basic Metabolic Panel:  Recent Labs Lab 08/27/15 2008  NA 136  K 3.9  CL 103  CO2 25  GLUCOSE 389*  BUN 10  CREATININE 0.79  CALCIUM 8.5*   GFR: CrCl cannot be calculated (Unknown ideal weight.). Liver Function Tests: No results for input(s): AST, ALT, ALKPHOS, BILITOT, PROT, ALBUMIN in the last 168 hours. No results for input(s): LIPASE, AMYLASE in the last 168 hours. No results for input(s): AMMONIA in the last 168 hours. Coagulation Profile: No results for input(s): INR, PROTIME in the last 168 hours. Cardiac Enzymes:  Recent Labs Lab  08/27/15 2008  TROPONINI 0.03*   BNP (last 3 results) No results for input(s): PROBNP in the last 8760 hours. HbA1C: No results for input(s): HGBA1C in the last 72 hours. CBG: No results for input(s): GLUCAP in the last 168 hours. Lipid Profile: No results for input(s): CHOL, HDL, LDLCALC, TRIG, CHOLHDL, LDLDIRECT in the last 72 hours. Thyroid Function Tests: No results for input(s): TSH, T4TOTAL, FREET4, T3FREE, THYROIDAB in the last 72 hours. Anemia Panel: No results for input(s): VITAMINB12, FOLATE, FERRITIN, TIBC, IRON, RETICCTPCT in the last 72 hours. Urine analysis:    Component Value Date/Time   COLORURINE AMBER* 12/14/2013 0133   APPEARANCEUR TURBID* 12/14/2013 0133   LABSPEC 1.023 12/14/2013 0133   PHURINE 6.0 12/14/2013 0133   GLUCOSEU NEGATIVE 12/14/2013 0133   HGBUR NEGATIVE 12/14/2013 0133   BILIRUBINUR MODERATE* 12/14/2013 0133   KETONESUR 15* 12/14/2013 0133   PROTEINUR 30* 12/14/2013 0133   UROBILINOGEN 1.0 12/14/2013 0133   NITRITE NEGATIVE 12/14/2013 0133   LEUKOCYTESUR SMALL* 12/14/2013 0133   Sepsis Labs: @LABRCNTIP (procalcitonin:4,lacticidven:4) )No results found for this or any previous visit (from the past 240 hour(s)).    UA not obtained  Lab Results  Component Value Date   HGBA1C 5.8* 12/12/2013    CrCl cannot be calculated (Unknown ideal weight.).  BNP (last 3 results) No results for input(s): PROBNP in the last 8760 hours.   ECG REPORT  Independently reviewed Rate: 86  Rhythm: NSR ST&T Change: No acute ischemic changes   QTC 450  There were no vitals filed for this visit.   Cultures:    Component Value Date/Time   SDES URINE, CLEAN CATCH 12/01/2013 2223   SPECREQUEST NONE 12/01/2013 2223   CULT  12/01/2013 2223    Multiple bacterial morphotypes present, none predominant. Suggest appropriate recollection if clinically indicated. Performed at 2224   REPTSTATUS 12/03/2013 FINAL 12/01/2013 2223       Radiological Exams on Admission: Dg Chest 2 View  08/27/2015  CLINICAL DATA:  Shortness of breath and bilateral lower extremity swelling. EXAM: CHEST  2 VIEW COMPARISON:  08/20/2013 chest radiograph. FINDINGS: Partially visualized right shoulder hemiarthroplasty. Stable cardiomediastinal silhouette with top-normal heart size and moderate to large hiatal hernia. No pneumothorax. No pleural effusion. No pulmonary edema. No acute consolidative airspace disease. Coronary stents overlie the heart on lateral view. Vertebroplasty material is noted within several mid thoracic vertebral bodies, unchanged. Moderate thoracic spondylosis. IMPRESSION: 1. No active cardiopulmonary disease. 2. Stable moderate to large hiatal hernia. Electronically Signed   By: 10/21/2013 M.D.   On: 08/27/2015 19:41   Ct Angio Chest Pe W Or Wo Contrast  08/27/2015  CLINICAL DATA:  Acute onset of shortness of breath and bilateral leg swelling. Abdominal distention. Initial encounter. EXAM: CT ANGIOGRAPHY CHEST WITH CONTRAST TECHNIQUE: Multidetector CT imaging of the chest  was performed using the standard protocol during bolus administration of intravenous contrast. Multiplanar CT image reconstructions and MIPs were obtained to evaluate the vascular anatomy. CONTRAST:  65 mL of Isovue 370 IV contrast COMPARISON:  CTA of the chest performed 01/21/2014, and chest radiograph performed earlier today at 7:28 p.m. FINDINGS: There is no evidence of pulmonary embolus. Mild bibasilar atelectasis is noted. The lungs are otherwise grossly clear. There is no evidence of significant focal consolidation, pleural effusion or pneumothorax. No masses are identified; no abnormal focal contrast enhancement is seen. A moderate to large hiatal hernia is noted. Diffuse coronary artery calcifications are seen. Visualized mediastinal nodes remain borderline normal in size. No pericardial effusion is seen. Diffuse calcification is noted along the thoracic aorta and  proximal great vessels. A 1.8 cm hypodensity at the right thyroid lobe is relatively stable and likely benign. No axillary lymphadenopathy is seen. The visualized portions of the liver are unremarkable. Nonspecific calcified granulomata within the spleen are relatively stable in appearance. Vague heterogeneity within the spleen is thought to reflect the phase of contrast enhancement, and is similar to 2015. The patient is status post cholecystectomy. The visualized portions of the pancreas and adrenal glands are within normal limits. No acute osseous abnormalities are seen. Degenerative change is noted about the patient's right shoulder arthroplasty, with chronic superior subluxation. Chronic compression deformities are noted at the mid thoracic spine, at T7-T9, with associated changes of vertebroplasty. Review of the MIP images confirms the above findings. IMPRESSION: 1. No evidence of pulmonary embolus. 2. Mild bibasilar atelectasis noted.  Lungs otherwise grossly clear. 3. Moderate to large hiatal hernia seen. 4. Diffuse coronary artery calcifications seen. 5. Diffuse calcification along the thoracic aorta and proximal great vessels. 6. 1.8 cm hypodensity at the right thyroid lobe is relatively stable and likely benign. 7. Chronic compression deformities at the mid thoracic spine, at T7-T9, with associated changes of vertebroplasty. 8. Degenerative change about the right shoulder arthroplasty, with chronic superior subluxation. Electronically Signed   By: Roanna Raider M.D.   On: 08/27/2015 22:52    Chart has been reviewed    Assessment/Plan  82 y.o. female with medical history significant of DM 2, COPD, frequent falls, rheumatoid arthritis, coronary artery disease history of recurrent DVT no longer on anticoagulation secondary to falls. PAD that is post right iliac stenting, AAA status post repair, HTN, CVA, Barrett esophagus, left subdural hematoma small in 2015 here with dyspnea likely multifactorial  combination of COPD exacerbation and mild acute on chronic diastolic heart failure patient would like to avoid over aggressive interventions she wishes to be DO NOT RESUSCITATE   Present on Admission:  . COPD exacerbation (HCC) -  - Will initiate Steroid taper, antibiotics, Albuterol PRN, scheduled Atrovent, Dulera and Mucinex. Titrate O2 to saturation >90%. Follow patients respiratory status.  . DM (diabetes mellitus), type 2, uncontrolled with complications (HCC) with a sliding scale monitor blood sugars  . history of DVT, Right Femoral, "chronic" -   patient currently off anticoagulation secondary to recurrent falls and history of subdural hematoma 2 years ago - patient is concerned if she has recurrent DVT secondary to leg swelling discussed with her benefits of doing father testing. Patient this point wishes to have Dopplers done to have this evaluated  . Essential hypertension stable continue home medications  . PAD (peripheral artery disease) (HCC) table current asymptomatic  . Protein-calorie malnutrition (HCC) add prealbumin nutritional consult ordered . Rheumatoid arthritis (HCC) chronic continue home medications currently stable  chest pain atypical - given risk factors will admit, monitor on telemetry, cycle cardiac enzymes, obtain serial ECG. Further risk stratify with lipid panel, hgA1C, obtain TSH. Make sure patient is on Aspirin. Further treatment based on the currently pending results.  . Elevated troponin  - likely secondary to demand ischemia, atypical chest pain, continue to monitor patient states she does not wish to have cardiac catheterization performed in case of MI. Wishes to avoid over aggressive interventions. But would be amendable to nonaggressive intervention such as aspirin or heparin given dyspnea and apparent fluid gain Will obtain echogram Mild acute on chronic diastolic heart failure - gentle diuresis. Admit per CHF protocol avoid overdiuresis. Patient reports gaining  weight and increase in fluid overload in her legs and abdomen Other plan as per orders.  DVT prophylaxis:    Lovenox     Code Status:    DNR/DNI as per patient a shin and family would like that would over aggressive interventions  Family Communication:   Family  at  Bedside  plan of care was discussed with Ty Hilts [phone]  Disposition Plan:     To home once workup is complete and patient is stable   Consults called:  none   Admission status:   inpatient       Level of care    tele           I have spent a total of 60 min on this admission  extra time was spent to discuss case stents with patient and family 35 minutes was spent face-to-face  Icelynn Onken 08/27/2015, 12:51 AM    [facility]  Pager [phone]   after 2 AM please page floor coverage PA If 7AM-7PM, please contact the day team taking care of the patient  [email or web address]  Password [email or web address]

## 2015-08-27 NOTE — ED Provider Notes (Signed)
CSN: [id number]     Arrival date & time 08/27/15  1835 History   First MD Initiated Contact with Patient 08/27/15 1941     Chief Complaint  Patient presents with  . Shortness of Breath  . Leg Swelling     (Consider location/radiation/quality/duration/timing/severity/associated sxs/prior Treatment) HPI Comments: Patient here complaining of shortness of breath and bilateral leg swelling 4 days. Patient was on home hospice but now she is receiving palliative care. She has had no productive cough without fever or chills. No anginal chest pain. Has a history of COPD as well as DVT in the past. Leg swelling has been waxing and waning. No recent adjustments to her medications. Her home health nurse saw the patient today and sent in here for further evaluation and management  Patient is a 82 y.o. female presenting with shortness of breath. The history is provided by the patient and a relative.  Shortness of Breath   Past Medical History  Diagnosis Date  . History of  Angina     Treated with PCI in 2004; no active Symptoms.  . CAD S/P percutaneous coronary angioplasty 2004    PCI-RCA (2.65mm x 49mm Taxus DES)& Cx (2.75 mm x 14 mm Cypher DES)  . PAD (peripheral artery disease) (HCC) 1994    History of right iliac stenting, fem-fem bypass in 94; aortobifem bypass/ AAA repair - November 2004; latest ABIs of been roughly 1 bilaterally, followed by Dr. Arbie Cookey  . Essential hypertension     Labile  . Hyperlipidemia LDL goal <70     Statin intolerant  . History of ischemic stroke without residual deficits     Diagnosed by Radiographic Imaging  . DVT, femoral, chronic (HCC)     Bilateral -- was on chronic Warfarin until fall with subdural hematoma in 2015  . COPD mixed type [facility]) CT Chest 01/2014    Former Smoker; Centrilobular emphysema with bronchiectasis  . Chronic pneumonia (HCC)     Several "Bouts"  . Shortness of breath     Chronic MILD- WITH EXERTION. chronic  . Aortic atherosclerosis (HCC)      Noted on CT scan. Also with aortobifem and AAA repair  . Bilateral edema of lower extremity   . Gastroesophageal reflux disease with hiatal hernia   . Barrett esophagus   . Osteoarthritis of both hips      status post multiple hip operations, recent fall with fracture.    [name] DJD (degenerative joint disease)   . Rheumatoid arthritis (HCC)   . Fall from slip, trip, or stumble 08/20/2013  . Closed hip fracture (HCC)     Right  . Neuropathy (HCC)   . Iron deficiency anemia   . Osteoporosis   . Shoulder pain, right     RIGHT SHOULDER WITH LIMITED ROM--STATES PREVIOUS RT TOTAL SHOULDDER REPLACEMENT-BUT STILL HAS PROBLEMS WITH SHOULDER  . History of blood transfusion   . H/O migraine     "used to have them; not anymore" (08/22/2013)   Past Surgical History  Procedure Laterality Date  . Back surgery      x 2  . Appendectomy  1950  . Total shoulder replacement Right 2011  . Cholecystectomy      several yrs ago  . Cataract extraction w/ intraocular lens  implant, bilateral Bilateral 2001-2002  . Total abdominal hysterectomy  1965  . Upper gastrointestinal endoscopy  multiple    w/bx, hiatal hernia, Barretts', esophageal polyp 1x  . Colonoscopy  05/09/2004; 06/08/1999    internal  hemorrhoids; diverticulosis  . Shoulder open rotator cuff repair Bilateral     2 times on left  . Lumbar spine surgery  October 2012    spinal stenosis, Dr. Ophelia Charter  . Abdominal aortic aneurysm repair  2004    AortoBi-Fem Bypass  . Carpal tunnel release Left 04/2011  . Total hip arthroplasty  01/26/2012    Procedure: TOTAL HIP ARTHROPLASTY ANTERIOR APPROACH;  Surgeon: Kathryne Hitch, MD;  Location: [facility];  Service: Orthopedics;  Laterality: Left;  Left Total Hip Arthroplasty, Anterior Approach (C-Arm)  . Intramedullary (im) nail intertrochanteric Right 04/12/2012    Procedure: INTRAMEDULLARY (IM) NAIL INTERTROCHANTRIC Right;  Surgeon: Kathryne Hitch, MD;  Location: [facility] OR;  Service: Orthopedics;   Laterality: Right;  . Doppler echocardiography  06/06/2012    EF 50-60%.  Mild LVH, grade one diastolic function; normal PA pressures and CVP  . Lower extremity venous dopplers  08/16/2012    Partially occlusive right femoral vein thrombosis, also noted in right peroneal vein; no thrombosis in left sided veins.  . Nm myoview ltd  April 2014    Bowel artifact, No ischemia or infarction  . Femoral-femoral bypass graft  1994    Following right iliac stenting  . Doppler echocardiography  06/06/2012    Mild LVH, EF 55-60%.  Normal wall motion.  Mildly elevated LVEDP with grade one diastolic function.  Mild LA dilatation.  Normal PA pressures.  . Tubal ligation  1956  . Excision/release bursa hip Right 12/27/2012    Procedure: RIGHT HIP BURSECTOMY AND COMPRESSION SCREW REMOVAL;  Surgeon: Kathryne Hitch, MD;  Location: [facility];  Service: Orthopedics;  Laterality: Right;  . Joint replacement    . Coronary angioplasty with stent placement  2004    RCA: Taxus DES 2.5 mm x 12 mm; Circumflex: Cypher DES 2.75 mm 14 mm  . Cardiac catheterization  October 2005    Patent RCA stent, 20% ISR of Cx stent; EF 50-55%  . Lexiscan cardiolite nuclear stress test     Family History  Problem Relation Age of Onset  . Prostate cancer Father   . Diabetes Mother   . Colon cancer Neg Hx   . Breast cancer Mother   . Heart disease Mother    Social History  Substance Use Topics  . Smoking status: Former Smoker -- 1.00 packs/day for 50 years    Types: Cigarettes    Quit date: 04/20/2000  . Smokeless tobacco: Never Used  . Alcohol Use: No   OB History    No data available     Review of Systems  Respiratory: Positive for shortness of breath.   All other systems reviewed and are negative.     Allergies  Codeine; Hydrocodone; Morphine and related; Other; Penicillins; Demerol; Propoxyphene; Statins; Sulfasalazine; and Tape  Home Medications   Prior to Admission medications   Medication Sig Start  Date End Date Taking? Authorizing Provider  albuterol (ACCUNEB) 1.25 MG/3ML nebulizer solution Take 1 ampule by nebulization 3 (three) times daily as needed for wheezing.    Historical Provider, MD  albuterol (PROVENTIL HFA;VENTOLIN HFA) 108 (90 BASE) MCG/ACT inhaler Inhale 2 puffs into the lungs every 6 (six) hours as needed for wheezing or shortness of breath.    Historical Provider, MD  Calcium Carbonate-Vitamin D (CALTRATE 600+D) 600-400 MG-UNIT per tablet Take 1 tablet by mouth every evening.     Historical Provider, MD  Camphor-Menthol-Methyl Sal (SALONPAS) 1.2-5.7-6.3 % PTCH Place 1 patch onto the skin daily as  needed (For pain.).    Historical Provider, MD  cholecalciferol (VITAMIN D) 1000 UNITS tablet Take 2,000 Units by mouth every morning.    Historical Provider, MD  docusate sodium (COLACE) 100 MG capsule Take 100 mg by mouth daily.     Historical Provider, MD  escitalopram (LEXAPRO) 5 MG tablet Take 5 mg by mouth every evening.     Historical Provider, MD  fluticasone (FLONASE) 50 MCG/ACT nasal spray Place 2 sprays into both nostrils daily. Patient not taking: Reported on 05/08/2014 04/16/14   Kalman Shan, MD  Fluticasone Furoate-Vilanterol (BREO ELLIPTA) 100-25 MCG/INH AEPB Inhale 1 puff into the lungs daily. 04/16/14   Kalman Shan, MD  hydroxychloroquine (PLAQUENIL) 200 MG tablet Take 300 mg by mouth daily.     Historical Provider, MD  isosorbide mononitrate (IMDUR) 30 MG 24 hr tablet Take 1 tablet (30 mg total) by mouth daily. 04/15/14   Marykay Lex, MD  lactose free nutrition (BOOST PLUS) LIQD Take 237 mLs by mouth 2 (two) times daily between meals as needed (As requested by pt or family). 12/10/13   Nishant Dhungel, MD  Multiple Vitamin (MULTIVITAMIN WITH MINERALS) TABS tablet Take 1 tablet by mouth daily at 12 noon.    Historical Provider, MD  omeprazole (PRILOSEC OTC) 20 MG tablet Take 20 mg by mouth every morning.     Historical Provider, MD  polyethylene glycol  (MIRALAX / GLYCOLAX) packet Take 17 g by mouth daily. Patient taking differently: Take 17 g by mouth daily as needed.  12/06/13   Rhetta Mura, MD  promethazine (PHENERGAN) 12.5 MG tablet Take 12.5 mg by mouth every 6 (six) hours as needed for nausea (Patient takes with Tramadol).    Historical Provider, MD  traMADol (ULTRAM) 50 MG tablet Take 1 tablet (50 mg total) by mouth every 6 (six) hours as needed for moderate pain. 12/15/13   Christiane Ha, MD   BP 139/96 mmHg  Pulse 86  Temp(Src) 98.5 F (36.9 C) (Oral)  Resp 20  SpO2 97% Physical Exam  Constitutional: She is oriented to person, place, and time. She appears cachectic. She has a sickly appearance. She appears ill.  HENT:  Head: Normocephalic and atraumatic.  Eyes: Conjunctivae, EOM and lids are normal. Pupils are equal, round, and reactive to light.  Neck: Normal range of motion. Neck supple. No tracheal deviation present. No thyroid mass present.  Cardiovascular: Normal rate, regular rhythm and normal heart sounds.  Exam reveals no gallop.   No murmur heard. Pulmonary/Chest: Effort normal. No stridor. No respiratory distress. She has decreased breath sounds. She has wheezes. She has no rhonchi. She has no rales.  Abdominal: Soft. Normal appearance and bowel sounds are normal. She exhibits no distension. There is no tenderness. There is no rebound and no CVA tenderness.  Musculoskeletal: Normal range of motion. She exhibits no edema or tenderness.  Lymphadenopathy:  2+ bilateral lower extremity pitting edema  Neurological: She is alert and oriented to person, place, and time. She has normal strength. No cranial nerve deficit or sensory deficit. GCS eye subscore is 4. GCS verbal subscore is 5. GCS motor subscore is 6.  Skin: Skin is warm and dry. No abrasion and no rash noted.  Psychiatric: She has a normal mood and affect. Her speech is normal and behavior is normal.  Nursing note and vitals reviewed.   ED Course   Procedures (including critical care time) Labs Review Labs Reviewed  CBC WITH DIFFERENTIAL/PLATELET  BASIC METABOLIC PANEL  BRAIN  NATRIURETIC PEPTIDE  TROPONIN I    Imaging Review Dg Chest 2 View  08/27/2015  CLINICAL DATA:  Shortness of breath and bilateral lower extremity swelling. EXAM: CHEST  2 VIEW COMPARISON:  08/20/2013 chest radiograph. FINDINGS: Partially visualized right shoulder hemiarthroplasty. Stable cardiomediastinal silhouette with top-normal heart size and moderate to large hiatal hernia. No pneumothorax. No pleural effusion. No pulmonary edema. No acute consolidative airspace disease. Coronary stents overlie the heart on lateral view. Vertebroplasty material is noted within several mid thoracic vertebral bodies, unchanged. Moderate thoracic spondylosis. IMPRESSION: 1. No active cardiopulmonary disease. 2. Stable moderate to large hiatal hernia. Electronically Signed   By: Delbert Phenix M.D.   On: 08/27/2015 19:41   I have personally reviewed and evaluated these images and lab results as part of my medical decision-making.   EKG Interpretation   Date/Time:  Friday August 27 2015 18:44:58 EDT Ventricular Rate:  86 PR Interval:    QRS Duration: 102 QT Interval:  376 QTC Calculation: 450 R Axis:   -52 Text Interpretation:  Sinus rhythm Left anterior fascicular block LVH with  secondary repolarization abnormality Anterior Q waves, possibly due to LVH  No significant change since last tracing Confirmed by Michaelia Beilfuss  MD, Rudolf Blizard  ([id number]) on 08/27/2015 7:42:45 PM      MDM   Final diagnoses:  None    Patient elevated troponin noted. Elevated d-dimer noted as well and chest CT was negative for pulmonary embolism. Patient treated for likely COPD exacerbation with site Medrol as well as with albuterol. Will require admission for cycling of her cardiac enzymes as well as treatment of her dyspnea    Lorre Nick, MD 08/27/15 2321

## 2015-08-27 NOTE — ED Notes (Signed)
Pt transported to CT ?

## 2015-08-27 NOTE — ED Notes (Signed)
Pt c/o shortness of breath and bilateral leg swelling for 4 days. Pt states the SOB is chronic but feels more acute today. Pt states the swelling has worsened over the past few days and her abdominal is distended and tight. Pts palliative care PA recommended to come in and being evaluated, thinking possible CHF. Pt hx of COPD.

## 2015-08-28 ENCOUNTER — Encounter (HOSPITAL_COMMUNITY): Payer: Self-pay | Admitting: Internal Medicine

## 2015-08-28 ENCOUNTER — Inpatient Hospital Stay (HOSPITAL_COMMUNITY): Payer: Medicare Other

## 2015-08-28 DIAGNOSIS — R079 Chest pain, unspecified: Secondary | ICD-10-CM | POA: Diagnosis present

## 2015-08-28 DIAGNOSIS — R0789 Other chest pain: Secondary | ICD-10-CM | POA: Diagnosis not present

## 2015-08-28 DIAGNOSIS — Z7982 Long term (current) use of aspirin: Secondary | ICD-10-CM | POA: Diagnosis not present

## 2015-08-28 DIAGNOSIS — Z955 Presence of coronary angioplasty implant and graft: Secondary | ICD-10-CM | POA: Diagnosis not present

## 2015-08-28 DIAGNOSIS — J441 Chronic obstructive pulmonary disease with (acute) exacerbation: Secondary | ICD-10-CM | POA: Diagnosis not present

## 2015-08-28 DIAGNOSIS — R0600 Dyspnea, unspecified: Secondary | ICD-10-CM

## 2015-08-28 DIAGNOSIS — Z86718 Personal history of other venous thrombosis and embolism: Secondary | ICD-10-CM | POA: Diagnosis not present

## 2015-08-28 DIAGNOSIS — Z681 Body mass index (BMI) 19 or less, adult: Secondary | ICD-10-CM | POA: Diagnosis not present

## 2015-08-28 DIAGNOSIS — E1165 Type 2 diabetes mellitus with hyperglycemia: Secondary | ICD-10-CM | POA: Diagnosis not present

## 2015-08-28 DIAGNOSIS — I739 Peripheral vascular disease, unspecified: Secondary | ICD-10-CM | POA: Diagnosis present

## 2015-08-28 DIAGNOSIS — M7989 Other specified soft tissue disorders: Secondary | ICD-10-CM | POA: Diagnosis not present

## 2015-08-28 DIAGNOSIS — E46 Unspecified protein-calorie malnutrition: Secondary | ICD-10-CM | POA: Diagnosis not present

## 2015-08-28 DIAGNOSIS — R7989 Other specified abnormal findings of blood chemistry: Secondary | ICD-10-CM

## 2015-08-28 DIAGNOSIS — Z9049 Acquired absence of other specified parts of digestive tract: Secondary | ICD-10-CM | POA: Diagnosis not present

## 2015-08-28 DIAGNOSIS — Z9861 Coronary angioplasty status: Secondary | ICD-10-CM

## 2015-08-28 DIAGNOSIS — Z90710 Acquired absence of both cervix and uterus: Secondary | ICD-10-CM | POA: Diagnosis not present

## 2015-08-28 DIAGNOSIS — R296 Repeated falls: Secondary | ICD-10-CM | POA: Diagnosis present

## 2015-08-28 DIAGNOSIS — Z8673 Personal history of transient ischemic attack (TIA), and cerebral infarction without residual deficits: Secondary | ICD-10-CM | POA: Diagnosis not present

## 2015-08-28 DIAGNOSIS — Z66 Do not resuscitate: Secondary | ICD-10-CM | POA: Diagnosis present

## 2015-08-28 DIAGNOSIS — Z515 Encounter for palliative care: Secondary | ICD-10-CM | POA: Diagnosis not present

## 2015-08-28 DIAGNOSIS — Z96611 Presence of right artificial shoulder joint: Secondary | ICD-10-CM | POA: Diagnosis present

## 2015-08-28 DIAGNOSIS — I10 Essential (primary) hypertension: Secondary | ICD-10-CM | POA: Diagnosis not present

## 2015-08-28 DIAGNOSIS — I5033 Acute on chronic diastolic (congestive) heart failure: Secondary | ICD-10-CM | POA: Diagnosis not present

## 2015-08-28 DIAGNOSIS — I2510 Atherosclerotic heart disease of native coronary artery without angina pectoris: Secondary | ICD-10-CM | POA: Diagnosis not present

## 2015-08-28 DIAGNOSIS — Z96642 Presence of left artificial hip joint: Secondary | ICD-10-CM | POA: Diagnosis present

## 2015-08-28 DIAGNOSIS — M069 Rheumatoid arthritis, unspecified: Secondary | ICD-10-CM | POA: Diagnosis present

## 2015-08-28 DIAGNOSIS — Z833 Family history of diabetes mellitus: Secondary | ICD-10-CM | POA: Diagnosis not present

## 2015-08-28 DIAGNOSIS — I248 Other forms of acute ischemic heart disease: Secondary | ICD-10-CM | POA: Diagnosis present

## 2015-08-28 DIAGNOSIS — I110 Hypertensive heart disease with heart failure: Secondary | ICD-10-CM | POA: Diagnosis not present

## 2015-08-28 DIAGNOSIS — Z79899 Other long term (current) drug therapy: Secondary | ICD-10-CM | POA: Diagnosis not present

## 2015-08-28 DIAGNOSIS — Z7189 Other specified counseling: Secondary | ICD-10-CM | POA: Diagnosis not present

## 2015-08-28 DIAGNOSIS — Z8249 Family history of ischemic heart disease and other diseases of the circulatory system: Secondary | ICD-10-CM | POA: Diagnosis not present

## 2015-08-28 DIAGNOSIS — E785 Hyperlipidemia, unspecified: Secondary | ICD-10-CM | POA: Diagnosis present

## 2015-08-28 DIAGNOSIS — Z8679 Personal history of other diseases of the circulatory system: Secondary | ICD-10-CM | POA: Diagnosis not present

## 2015-08-28 DIAGNOSIS — K5900 Constipation, unspecified: Secondary | ICD-10-CM | POA: Diagnosis present

## 2015-08-28 DIAGNOSIS — Z87891 Personal history of nicotine dependence: Secondary | ICD-10-CM | POA: Diagnosis not present

## 2015-08-28 DIAGNOSIS — R0602 Shortness of breath: Secondary | ICD-10-CM | POA: Diagnosis not present

## 2015-08-28 LAB — COMPREHENSIVE METABOLIC PANEL
ALK PHOS: 49 U/L (ref 38–126)
ALT: 24 U/L (ref 14–54)
ANION GAP: 6 (ref 5–15)
AST: 19 U/L (ref 15–41)
Albumin: 3.3 g/dL — ABNORMAL LOW (ref 3.5–5.0)
BILIRUBIN TOTAL: 0.4 mg/dL (ref 0.3–1.2)
BUN: 12 mg/dL (ref 6–20)
CO2: 27 mmol/L (ref 22–32)
Calcium: 8.7 mg/dL — ABNORMAL LOW (ref 8.9–10.3)
Chloride: 102 mmol/L (ref 101–111)
Creatinine, Ser: 0.63 mg/dL (ref 0.44–1.00)
GFR calc Af Amer: >60 mL/min (ref >60)
GLUCOSE: 355 mg/dL — AB (ref 65–99)
POTASSIUM: 3.9 mmol/L (ref 3.5–5.1)
Sodium: 135 mmol/L (ref 135–145)
TOTAL PROTEIN: 5.6 g/dL — AB (ref 6.5–8.1)

## 2015-08-28 LAB — CBC
HEMATOCRIT: 33.9 % — AB (ref 36.0–46.0)
HEMOGLOBIN: 11.5 g/dL — AB (ref 12.0–15.0)
MCH: 31.7 pg (ref 26.0–34.0)
MCHC: 33.9 g/dL (ref 30.0–36.0)
MCV: 93.4 fL (ref 78.0–100.0)
Platelets: 177 10*3/uL (ref 150–400)
RBC: 3.63 MIL/uL — ABNORMAL LOW (ref 3.87–5.11)
RDW: 13.9 % (ref 11.5–15.5)
WBC: 7.6 10*3/uL (ref 4.0–10.5)

## 2015-08-28 LAB — TROPONIN I
TROPONIN I: 0.04 ng/mL — AB (ref <0.03)
TROPONIN I: 0.13 ng/mL — AB (ref <0.03)
TROPONIN I: 0.22 ng/mL — AB (ref <0.03)
TROPONIN I: 0.27 ng/mL — AB (ref <0.03)

## 2015-08-28 LAB — GLUCOSE, CAPILLARY
GLUCOSE-CAPILLARY: 409 mg/dL — AB (ref 65–99)
GLUCOSE-CAPILLARY: 88 mg/dL (ref 65–99)
Glucose-Capillary: 328 mg/dL — ABNORMAL HIGH (ref 65–99)
Glucose-Capillary: 156 mg/dL — ABNORMAL HIGH (ref 65–99)
Glucose-Capillary: 102 mg/dL — ABNORMAL HIGH (ref 65–99)

## 2015-08-28 LAB — PREALBUMIN: Prealbumin: 22 mg/dL (ref 18–38)

## 2015-08-28 LAB — PHOSPHORUS: PHOSPHORUS: 3.6 mg/dL (ref 2.5–4.6)

## 2015-08-28 LAB — ECHOCARDIOGRAM COMPLETE
Height: 63 in
Weight: 1897.72 oz

## 2015-08-28 LAB — MAGNESIUM: MAGNESIUM: 1.9 mg/dL (ref 1.7–2.4)

## 2015-08-28 LAB — TSH: TSH: 0.766 u[IU]/mL (ref 0.350–4.500)

## 2015-08-28 MED ORDER — FLUTICASONE FUROATE-VILANTEROL 100-25 MCG/INH IN AEPB
1.0000 | INHALATION_SPRAY | Freq: Every day | RESPIRATORY_TRACT | Status: DC
  Administered 2015-08-29 – 2015-08-31 (×3): 1 via RESPIRATORY_TRACT
  Filled 2015-08-28: qty 28

## 2015-08-28 MED ORDER — FUROSEMIDE 20 MG PO TABS
20.0000 mg | ORAL_TABLET | Freq: Two times a day (BID) | ORAL | Status: DC
  Administered 2015-08-28 – 2015-08-31 (×7): 20 mg via ORAL
  Filled 2015-08-28 (×7): qty 1

## 2015-08-28 MED ORDER — ESCITALOPRAM OXALATE 10 MG PO TABS
5.0000 mg | ORAL_TABLET | Freq: Every evening | ORAL | Status: DC
Start: 2015-08-28 — End: 2015-08-31
  Administered 2015-08-28 – 2015-08-30 (×3): 5 mg via ORAL
  Filled 2015-08-28 (×3): qty 1

## 2015-08-28 MED ORDER — POLYETHYLENE GLYCOL 3350 17 G PO PACK: 17.0000 g | PACK | Freq: Every day | ORAL | Status: DC | PRN

## 2015-08-28 MED ORDER — HYDROCODONE-ACETAMINOPHEN 5-325 MG PO TABS: 1.0000 | ORAL_TABLET | ORAL | Status: DC | PRN

## 2015-08-28 MED ORDER — HYDRALAZINE HCL 20 MG/ML IJ SOLN: 10.0000 mg | INTRAMUSCULAR | Status: DC | PRN

## 2015-08-28 MED ORDER — ACETAMINOPHEN 650 MG RE SUPP: 650.0000 mg | Freq: Four times a day (QID) | RECTAL | Status: DC | PRN

## 2015-08-28 MED ORDER — GUAIFENESIN ER 600 MG PO TB12
600.0000 mg | ORAL_TABLET | Freq: Every day | ORAL | Status: DC
  Administered 2015-08-28 – 2015-08-29 (×2): 600 mg via ORAL
  Filled 2015-08-28 (×3): qty 1

## 2015-08-28 MED ORDER — ASPIRIN EC 81 MG PO TBEC
81.0000 mg | DELAYED_RELEASE_TABLET | ORAL | Status: DC
  Administered 2015-08-29 – 2015-08-31 (×2): 81 mg via ORAL
  Filled 2015-08-28 (×2): qty 1

## 2015-08-28 MED ORDER — TRAMADOL HCL 50 MG PO TABS: 50.0000 mg | ORAL_TABLET | Freq: Four times a day (QID) | ORAL | Status: DC | PRN

## 2015-08-28 MED ORDER — INSULIN ASPART 100 UNIT/ML ~~LOC~~ SOLN
0.0000 [IU] | Freq: Three times a day (TID) | SUBCUTANEOUS | Status: DC
  Administered 2015-08-28: 11 [IU] via SUBCUTANEOUS
  Administered 2015-08-28 – 2015-08-29 (×2): 3 [IU] via SUBCUTANEOUS
  Administered 2015-08-30: 2 [IU] via SUBCUTANEOUS
  Administered 2015-08-31: 8 [IU] via SUBCUTANEOUS

## 2015-08-28 MED ORDER — ONDANSETRON HCL 4 MG/2ML IJ SOLN
4.0000 mg | Freq: Four times a day (QID) | INTRAMUSCULAR | Status: DC | PRN
Start: 2015-08-28 — End: 2015-08-31
  Administered 2015-08-30 (×2): 4 mg via INTRAVENOUS
  Filled 2015-08-28 (×3): qty 2

## 2015-08-28 MED ORDER — INSULIN ASPART 100 UNIT/ML ~~LOC~~ SOLN
0.0000 [IU] | Freq: Every day | SUBCUTANEOUS | Status: DC
  Administered 2015-08-28: 5 [IU] via SUBCUTANEOUS
  Administered 2015-08-30: 2 [IU] via SUBCUTANEOUS

## 2015-08-28 MED ORDER — HYDROXYCHLOROQUINE SULFATE 200 MG PO TABS
300.0000 mg | ORAL_TABLET | Freq: Every day | ORAL | Status: DC
  Administered 2015-08-28 – 2015-08-31 (×4): 300 mg via ORAL
  Filled 2015-08-28 (×4): qty 1.5

## 2015-08-28 MED ORDER — IPRATROPIUM-ALBUTEROL 0.5-2.5 (3) MG/3ML IN SOLN
3.0000 mL | Freq: Three times a day (TID) | RESPIRATORY_TRACT | Status: DC
  Administered 2015-08-28 – 2015-08-31 (×10): 3 mL via RESPIRATORY_TRACT
  Filled 2015-08-28 (×11): qty 3

## 2015-08-28 MED ORDER — BISACODYL 10 MG RE SUPP: 10.0000 mg | Freq: Every day | RECTAL | Status: DC | PRN

## 2015-08-28 MED ORDER — ALBUTEROL SULFATE (2.5 MG/3ML) 0.083% IN NEBU: 2.5000 mg | INHALATION_SOLUTION | RESPIRATORY_TRACT | Status: DC | PRN

## 2015-08-28 MED ORDER — PANTOPRAZOLE SODIUM 40 MG PO TBEC
40.0000 mg | DELAYED_RELEASE_TABLET | Freq: Every day | ORAL | Status: DC
  Administered 2015-08-28 – 2015-08-30 (×3): 40 mg via ORAL
  Filled 2015-08-28 (×3): qty 1

## 2015-08-28 MED ORDER — POLYVINYL ALCOHOL 1.4 % OP SOLN
1.0000 [drp] | OPHTHALMIC | Status: DC | PRN
  Administered 2015-08-28: 1 [drp] via OPHTHALMIC
  Filled 2015-08-28: qty 15

## 2015-08-28 MED ORDER — CALCIUM CARBONATE ANTACID 500 MG PO CHEW
1.0000 | CHEWABLE_TABLET | Freq: Once | ORAL | Status: AC
  Administered 2015-08-28: 200 mg via ORAL
  Filled 2015-08-28: qty 1

## 2015-08-28 MED ORDER — ONDANSETRON HCL 4 MG PO TABS
4.0000 mg | ORAL_TABLET | Freq: Four times a day (QID) | ORAL | Status: DC | PRN
  Filled 2015-08-28: qty 1

## 2015-08-28 MED ORDER — ISOSORBIDE MONONITRATE ER 30 MG PO TB24
30.0000 mg | ORAL_TABLET | Freq: Every day | ORAL | Status: DC
  Administered 2015-08-28 – 2015-08-31 (×4): 30 mg via ORAL
  Filled 2015-08-28 (×4): qty 1

## 2015-08-28 MED ORDER — SODIUM CHLORIDE 0.9% FLUSH
3.0000 mL | Freq: Two times a day (BID) | INTRAVENOUS | Status: DC
  Administered 2015-08-28 – 2015-08-31 (×7): 3 mL via INTRAVENOUS

## 2015-08-28 MED ORDER — DOCUSATE SODIUM 100 MG PO CAPS
100.0000 mg | ORAL_CAPSULE | Freq: Every day | ORAL | Status: DC
  Administered 2015-08-28 – 2015-08-31 (×4): 100 mg via ORAL
  Filled 2015-08-28 (×4): qty 1

## 2015-08-28 MED ORDER — ENOXAPARIN SODIUM 40 MG/0.4ML ~~LOC~~ SOLN
40.0000 mg | SUBCUTANEOUS | Status: DC
  Administered 2015-08-28 – 2015-08-31 (×4): 40 mg via SUBCUTANEOUS
  Filled 2015-08-28 (×4): qty 0.4

## 2015-08-28 MED ORDER — ACETAMINOPHEN 325 MG PO TABS: 650.0000 mg | ORAL_TABLET | Freq: Four times a day (QID) | ORAL | Status: DC | PRN

## 2015-08-28 MED ORDER — DOXYCYCLINE HYCLATE 100 MG PO TABS
100.0000 mg | ORAL_TABLET | Freq: Two times a day (BID) | ORAL | Status: DC
  Administered 2015-08-28 – 2015-08-31 (×8): 100 mg via ORAL
  Filled 2015-08-28 (×8): qty 1

## 2015-08-28 NOTE — Progress Notes (Signed)
PROGRESS NOTE    [name]  WNU:[id number] DOB: 02-Mar-1930 DOA: 08/27/2015 PCP: Hoyle Sauer, MD    Brief Narrative:  82 y.o. female with medical history significant of DM 2, COPD, frequent falls, rheumatoid arthritis, coronary artery disease history of recurrent DVT no longer on anticoagulation secondary to falls. PAD that is post right iliac stenting, AAA status post repair, HTN, CVA, Barrett esophagus. Presented with worsening shortness of breath compared to baseline. She's been having worsening abdominal distention. And also worsening leg edema she is currently receiving palliative care. She had not endorse any fevers or chills. She reports while in ED she had some chest pain it was substernal and lasted a few min. she's been taking all her medications and recently had not had any changes to them. She was seen today by home health nurse was sent to emergency department. Patient reports some weight gain. She reports some swelling in her legs bilaterally and in her abdomen in the past 3 days.    Assessment & Plan:   Active Problems:   Essential hypertension   Rheumatoid arthritis (HCC)   CAD S/P PCI RCA & Circ with DES   DM (diabetes mellitus), type 2, uncontrolled with complications (HCC)   Long term (current) use of anticoagulants   DVT, Right Femoral, "chronic" -  with extension to the peroneal   PAD (peripheral artery disease) (HCC)   Protein-calorie malnutrition (HCC)   COPD exacerbation (HCC)   Elevated troponin   Leg swelling   Chest pain   Acute on chronic diastolic heart failure (HCC)   . COPD exacerbation (HCC) -  - started Steroid taper, antibiotics, Albuterol PRN, scheduled Atrovent, Dulera and Mucinex.  - Continue to wean O2 as tolerated  - Clinically improving   . DM (diabetes mellitus), type 2, uncontrolled with complications (HCC)  - Continue patient with a sliding scale monitor blood sugars   . history of DVT, Right Femoral, "chronic"  - patient  currently off anticoagulation secondary to recurrent falls and history of subdural hematoma 2 years ago  - patient is concerned if she has recurrent DVT secondary to leg swelling - Lower extremity Dopplers ordered, pending   . Essential hypertension  - stable  - continue home medications   . PAD (peripheral artery disease) (HCC)  - stable at present    . Protein-calorie malnutrition (HCC) - nutritional consult ordered  . Rheumatoid arthritis (HCC)  - chronic continue home medications currently stable   chest pain atypical  - Troponin increased from 0.03 on admission, currently 0.27 presently asymptomatic. EKG unremarkable for ischemic change - Tina monitor troponin trends  . Elevated troponin - likely secondary to demand ischemia, atypical chest pain, - On admission, patient states she does not wish to have cardiac catheterization performed in case of MI.  - Instead, patient would be amendable to nonaggressive intervention such as aspirin or heparin given dyspnea and apparent fluid gain  - 2-D echo performed noting an EF of 60-65% with no wall motion abnormality.  Mild acute on chronic diastolic heart failure  - gentle diuresis.  - Continue to monitor ins and outs and daily weights. Also monitor renal function closely..   DVT prophylaxis: Lovenox subcutaneous Code Status: DO NOT RESUSCITATE Family Communication: Patient in room, family not at bedside Disposition Plan: Uncertain at this time   Consultants:     Procedures:     Antimicrobials:  Antibiotics Given (last 72 hours)    Date/Time Action Medication Dose   08/28/15 0226  Given   doxycycline (VIBRA-TABS) tablet 100 mg 100 mg   08/28/15 1026 Given   hydroxychloroquine (PLAQUENIL) tablet 300 mg 300 mg   08/28/15 1027 Given   doxycycline (VIBRA-TABS) tablet 100 mg 100 mg           Subjective: Reports feeling better today. No complaints at this time  Objective: Filed Vitals:   08/28/15 0549 08/28/15  0652 08/28/15 1252 08/28/15 1455  BP: 187/103 186/76 163/74   Pulse: 65 79 87   Temp: 98.2 F (36.8 C)  98.3 F (36.8 C)   TempSrc: Oral  Oral   Resp: 20  18   Height:      Weight:      SpO2: 100%  100% 100%    Intake/Output Summary (Last 24 hours) at 08/28/15 1511 Last data filed at 08/28/15 1352  Gross per 24 hour  Intake    600 ml  Output    650 ml  Net    -50 ml   Filed Weights   08/28/15 0148  Weight: 53.8 kg (118 lb 9.7 oz)    Examination:  General exam: Appears calm and comfortable, Sitting in chair  Respiratory system: Clear to auscultation. Respiratory effort normal. Cardiovascular system: S1 & S2 heard, RRR Gastrointestinal system: Abdomen is nondistended, soft and nontender. No organomegaly or masses felt. Normal bowel sounds heard. Central nervous system: Alert and oriented. No focal neurological deficits. Extremities: Symmetric 5 x 5 power. Skin: No rashes, lesions Psychiatry: Judgement and insight appear normal. Mood & affect appropriate.     Data Reviewed: I have personally reviewed following labs and imaging studies  CBC:  Recent Labs Lab 08/27/15 2008 08/28/15 0359  WBC 6.0 7.6  NEUTROABS 5.2  --   HGB 11.6* 11.5*  HCT 34.8* 33.9*  MCV 94.3 93.4  PLT 184 177   Basic Metabolic Panel:  Recent Labs Lab 08/27/15 2008 08/28/15 0359  NA 136 135  K 3.9 3.9  CL 103 102  CO2 25 27  GLUCOSE 389* 355*  BUN 10 12  CREATININE 0.79 0.63  CALCIUM 8.5* 8.7*  MG  --  1.9  PHOS  --  3.6   GFR: Estimated Creatinine Clearance: 44.1 mL/min (by C-G formula based on Cr of 0.63). Liver Function Tests:  Recent Labs Lab 08/28/15 0359  AST 19  ALT 24  ALKPHOS 49  BILITOT 0.4  PROT 5.6*  ALBUMIN 3.3*   No results for input(s): LIPASE, AMYLASE in the last 168 hours. No results for input(s): AMMONIA in the last 168 hours. Coagulation Profile: No results for input(s): INR, PROTIME in the last 168 hours. Cardiac Enzymes:  Recent Labs Lab  08/27/15 2008 08/28/15 0029 08/28/15 0602 08/28/15 1219  TROPONINI 0.03* 0.04* 0.13* 0.27*   BNP (last 3 results) No results for input(s): PROBNP in the last 8760 hours. HbA1C: No results for input(s): HGBA1C in the last 72 hours. CBG:  Recent Labs Lab 08/28/15 0206 08/28/15 0724 08/28/15 1136  GLUCAP 409* 328* 156*   Lipid Profile: No results for input(s): CHOL, HDL, LDLCALC, TRIG, CHOLHDL, LDLDIRECT in the last 72 hours. Thyroid Function Tests:  Recent Labs  08/28/15 0359  TSH 0.766   Anemia Panel: No results for input(s): VITAMINB12, FOLATE, FERRITIN, TIBC, IRON, RETICCTPCT in the last 72 hours. Sepsis Labs: No results for input(s): PROCALCITON, LATICACIDVEN in the last 168 hours.  No results found for this or any previous visit (from the past 240 hour(s)).       Radiology Studies:  Dg Chest 2 View  08/27/2015  CLINICAL DATA:  Shortness of breath and bilateral lower extremity swelling. EXAM: CHEST  2 VIEW COMPARISON:  08/20/2013 chest radiograph. FINDINGS: Partially visualized right shoulder hemiarthroplasty. Stable cardiomediastinal silhouette with top-normal heart size and moderate to large hiatal hernia. No pneumothorax. No pleural effusion. No pulmonary edema. No acute consolidative airspace disease. Coronary stents overlie the heart on lateral view. Vertebroplasty material is noted within several mid thoracic vertebral bodies, unchanged. Moderate thoracic spondylosis. IMPRESSION: 1. No active cardiopulmonary disease. 2. Stable moderate to large hiatal hernia. Electronically Signed   By: Delbert Phenix M.D.   On: 08/27/2015 19:41   Ct Angio Chest Pe W Or Wo Contrast  08/27/2015  CLINICAL DATA:  Acute onset of shortness of breath and bilateral leg swelling. Abdominal distention. Initial encounter. EXAM: CT ANGIOGRAPHY CHEST WITH CONTRAST TECHNIQUE: Multidetector CT imaging of the chest was performed using the standard protocol during bolus administration of intravenous  contrast. Multiplanar CT image reconstructions and MIPs were obtained to evaluate the vascular anatomy. CONTRAST:  65 mL of Isovue 370 IV contrast COMPARISON:  CTA of the chest performed 01/21/2014, and chest radiograph performed earlier today at 7:28 p.m. FINDINGS: There is no evidence of pulmonary embolus. Mild bibasilar atelectasis is noted. The lungs are otherwise grossly clear. There is no evidence of significant focal consolidation, pleural effusion or pneumothorax. No masses are identified; no abnormal focal contrast enhancement is seen. A moderate to large hiatal hernia is noted. Diffuse coronary artery calcifications are seen. Visualized mediastinal nodes remain borderline normal in size. No pericardial effusion is seen. Diffuse calcification is noted along the thoracic aorta and proximal great vessels. A 1.8 cm hypodensity at the right thyroid lobe is relatively stable and likely benign. No axillary lymphadenopathy is seen. The visualized portions of the liver are unremarkable. Nonspecific calcified granulomata within the spleen are relatively stable in appearance. Vague heterogeneity within the spleen is thought to reflect the phase of contrast enhancement, and is similar to 2015. The patient is status post cholecystectomy. The visualized portions of the pancreas and adrenal glands are within normal limits. No acute osseous abnormalities are seen. Degenerative change is noted about the patient's right shoulder arthroplasty, with chronic superior subluxation. Chronic compression deformities are noted at the mid thoracic spine, at T7-T9, with associated changes of vertebroplasty. Review of the MIP images confirms the above findings. IMPRESSION: 1. No evidence of pulmonary embolus. 2. Mild bibasilar atelectasis noted.  Lungs otherwise grossly clear. 3. Moderate to large hiatal hernia seen. 4. Diffuse coronary artery calcifications seen. 5. Diffuse calcification along the thoracic aorta and proximal great  vessels. 6. 1.8 cm hypodensity at the right thyroid lobe is relatively stable and likely benign. 7. Chronic compression deformities at the mid thoracic spine, at T7-T9, with associated changes of vertebroplasty. 8. Degenerative change about the right shoulder arthroplasty, with chronic superior subluxation. Electronically Signed   By: Roanna Raider M.D.   On: 08/27/2015 22:52        Scheduled Meds: . [START ON 08/29/2015] aspirin EC  81 mg Oral QODAY  . docusate sodium  100 mg Oral Daily  . doxycycline  100 mg Oral Q12H  . enoxaparin (LOVENOX) injection  40 mg Subcutaneous Q24H  . escitalopram  5 mg Oral QPM  . fluticasone furoate-vilanterol  1 puff Inhalation Daily  . furosemide  20 mg Oral BID  . guaiFENesin  600 mg Oral Q1200  . hydroxychloroquine  300 mg Oral Daily  . insulin aspart  0-15 Units Subcutaneous TID WC  . insulin aspart  0-5 Units Subcutaneous QHS  . ipratropium-albuterol  3 mL Nebulization TID  . isosorbide mononitrate  30 mg Oral Daily  . sodium chloride flush  3 mL Intravenous Q12H   Continuous Infusions:    LOS: 0 days    CHIU, Scheryl Marten, MD [facility] Pager [phone]  If 7PM-7AM, please contact night-coverage [email or web address] Password [email or web address] 08/28/2015, 3:11 PM

## 2015-08-28 NOTE — Progress Notes (Signed)
Echocardiogram 2D Echocardiogram has been performed.  [name] 08/28/2015, 9:09 AM

## 2015-08-28 NOTE — Progress Notes (Signed)
*  Preliminary Results* Bilateral lower extremity venous duplex completed. Bilateral lower extremities are negative for deep vein thrombosis. There is no evidence of Baker's cyst bilaterally.  08/28/2015  Gertie Fey, RVT, RDCS, RDMS

## 2015-08-29 DIAGNOSIS — I5033 Acute on chronic diastolic (congestive) heart failure: Secondary | ICD-10-CM

## 2015-08-29 LAB — BASIC METABOLIC PANEL
Anion gap: 5 (ref 5–15)
BUN: 17 mg/dL (ref 6–20)
CHLORIDE: 104 mmol/L (ref 101–111)
CO2: 31 mmol/L (ref 22–32)
Calcium: 9 mg/dL (ref 8.9–10.3)
Creatinine, Ser: 0.71 mg/dL (ref 0.44–1.00)
GFR calc Af Amer: >60 mL/min (ref >60)
GFR calc non Af Amer: >60 mL/min (ref >60)
GLUCOSE: 95 mg/dL (ref 65–99)
POTASSIUM: 4 mmol/L (ref 3.5–5.1)
SODIUM: 140 mmol/L (ref 135–145)

## 2015-08-29 LAB — TROPONIN I
Troponin I: 0.21 ng/mL (ref <0.03)
Troponin I: 0.17 ng/mL (ref <0.03)

## 2015-08-29 LAB — GLUCOSE, CAPILLARY
GLUCOSE-CAPILLARY: 74 mg/dL (ref 65–99)
GLUCOSE-CAPILLARY: 162 mg/dL — AB (ref 65–99)
Glucose-Capillary: 82 mg/dL (ref 65–99)
Glucose-Capillary: 130 mg/dL — ABNORMAL HIGH (ref 65–99)

## 2015-08-29 MED ORDER — BISACODYL 5 MG PO TBEC
5.0000 mg | DELAYED_RELEASE_TABLET | Freq: Every day | ORAL | Status: DC | PRN
  Administered 2015-08-29: 5 mg via ORAL
  Filled 2015-08-29: qty 1

## 2015-08-29 NOTE — Progress Notes (Signed)
Nutrition Brief Note  RD consulted for nutritional assessment.  Patient's weight remains stable since 2014. Pt is eating 100% of meals at this time. If PO intake decreases, may need nutritional supplement.  Wt Readings from Last 15 Encounters:  08/29/15 115 lb 8.3 oz (52.4 kg)  05/08/14 116 lb (52.617 kg)  04/16/14 113 lb (51.256 kg)  04/08/14 111 lb (50.349 kg)  03/27/14 111 lb (50.349 kg)  12/12/13 119 lb (53.978 kg)  12/10/13 107 lb 5.8 oz (48.7 kg)  12/27/12 116 lb (52.617 kg)  12/24/12 116 lb (52.617 kg)  12/12/12 116 lb 6.4 oz (52.799 kg)  09/11/12 116 lb 12.8 oz (52.98 kg)  07/02/12 116 lb 9.6 oz (52.889 kg)  06/25/12 116 lb 9.6 oz (52.889 kg)  06/21/12 116 lb 9.6 oz (52.889 kg)  06/06/12 118 lb (53.524 kg)    Body mass index is 20.47 kg/(m^2). Patient meets criteria for normal based on current BMI.   Current diet order is CHO modified, patient is consuming approximately 100% of meals at this time. Labs and medications reviewed.   No nutrition interventions warranted at this time. If nutrition issues arise, please consult RD.   Tilda Franco, MS, RD, LDN Pager: [phone] After Hours Pager: [phone]

## 2015-08-29 NOTE — Progress Notes (Signed)
PROGRESS NOTE    [name]  FFM:[id number] DOB: February 05, 1931 DOA: 08/27/2015 PCP: Hoyle Sauer, MD    Brief Narrative:  82 y.o. female with medical history significant of DM 2, COPD, frequent falls, rheumatoid arthritis, coronary artery disease history of recurrent DVT no longer on anticoagulation secondary to falls. PAD that is post right iliac stenting, AAA status post repair, HTN, CVA, Barrett esophagus. Presented with worsening shortness of breath compared to baseline. She's been having worsening abdominal distention. And also worsening leg edema she is currently receiving palliative care. She had not endorse any fevers or chills. She reports while in ED she had some chest pain it was substernal and lasted a few min. she's been taking all her medications and recently had not had any changes to them. She was seen today by home health nurse was sent to emergency department. Patient reports some weight gain. She reports some swelling in her legs bilaterally and in her abdomen in the past 3 days.    Assessment & Plan:   Active Problems:   Essential hypertension   Rheumatoid arthritis (HCC)   CAD S/P PCI RCA & Circ with DES   DM (diabetes mellitus), type 2, uncontrolled with complications (HCC)   Long term (current) use of anticoagulants   DVT, Right Femoral, "chronic" -  with extension to the peroneal   PAD (peripheral artery disease) (HCC)   Protein-calorie malnutrition (HCC)   COPD exacerbation (HCC)   Elevated troponin   Leg swelling   Chest pain   Acute on chronic diastolic heart failure (HCC)   . COPD exacerbation (HCC) -  - started Steroid taper, antibiotics, Albuterol PRN, scheduled Atrovent, Dulera and Mucinex.  - Continue to wean O2 as tolerated  - Clinically improving   . DM (diabetes mellitus), type 2, uncontrolled with complications (HCC)  - Continue patient with a sliding scale monitor blood sugars   . history of DVT, Right Femoral, "chronic"  - patient  currently off anticoagulation secondary to recurrent falls and history of subdural hematoma 2 years ago  - Lower extremity Dopplers done and is neg for DVT  . Essential hypertension  - stable  - continue home medications   . PAD (peripheral artery disease) (HCC)  - stable at present    . Protein-calorie malnutrition (HCC) - nutritional consult ordered  . Rheumatoid arthritis (HCC)  - chronic continue home medications currently stable   chest pain atypical  - Troponin increased from 0.03 on admission, currently 0.27 presently asymptomatic. EKG unremarkable for ischemic change - Trop now trending down  . Elevated troponin - likely secondary to demand ischemia, atypical chest pain, - On admission, patient states she does not wish to have cardiac catheterization performed in case of MI.  - Instead, patient would be amendable to nonaggressive intervention such as aspirin or heparin given dyspnea and apparent fluid gain  - 2-D echo performed noting an EF of 60-65% with no wall motion abnormality.  Mild acute on chronic diastolic heart failure  - gentle diuresis.  - Continue to monitor ins and outs and daily weights. Also monitor renal function closely.  Constipation - Will give dulcolax   DVT prophylaxis: Lovenox subcutaneous Code Status: DO NOT RESUSCITATE Family Communication: Patient in room, family not at bedside Disposition Plan: Uncertain at this time   Consultants:     Procedures:     Antimicrobials:  Antibiotics Given (last 72 hours)    Date/Time Action Medication Dose   08/28/15 0226 Given   doxycycline (  VIBRA-TABS) tablet 100 mg 100 mg   08/28/15 1026 Given   hydroxychloroquine (PLAQUENIL) tablet 300 mg 300 mg   08/28/15 1027 Given   doxycycline (VIBRA-TABS) tablet 100 mg 100 mg   08/28/15 2137 Given   doxycycline (VIBRA-TABS) tablet 100 mg 100 mg   08/29/15 1122 Given   hydroxychloroquine (PLAQUENIL) tablet 300 mg 300 mg   08/29/15 1123 Given    doxycycline (VIBRA-TABS) tablet 100 mg 100 mg          Subjective: Complains of constipation  Objective: Filed Vitals:   08/29/15 0558 08/29/15 0725 08/29/15 1406 08/29/15 1421  BP: 184/77   141/59  Pulse: 68   82  Temp: 97.5 F (36.4 C)   98.4 F (36.9 C)  TempSrc: Oral   Oral  Resp: 20   20  Height:      Weight: 52.4 kg (115 lb 8.3 oz)     SpO2: 98% 99% 91% 97%    Intake/Output Summary (Last 24 hours) at 08/29/15 1446 Last data filed at 08/29/15 1158  Gross per 24 hour  Intake    600 ml  Output   3050 ml  Net  -2450 ml   Filed Weights   08/28/15 0148 08/29/15 0558  Weight: 53.8 kg (118 lb 9.7 oz) 52.4 kg (115 lb 8.3 oz)    Examination:  General exam: Appears calm and comfortable, Sitting in chair  Respiratory system: Clear to auscultation. Respiratory effort normal. Cardiovascular system: S1 & S2 heard, RRR Gastrointestinal system: Abdomen is nondistended, soft and nontender. No organomegaly or masses felt. Normal bowel sounds heard. Central nervous system: Alert and oriented. No focal neurological deficits. Extremities: Symmetric 5 x 5 power. Skin: No rashes, lesions Psychiatry: Judgement and insight appear normal. Mood & affect appropriate.     Data Reviewed: I have personally reviewed following labs and imaging studies  CBC:  Recent Labs Lab 08/27/15 2008 08/28/15 0359  WBC 6.0 7.6  NEUTROABS 5.2  --   HGB 11.6* 11.5*  HCT 34.8* 33.9*  MCV 94.3 93.4  PLT 184 177   Basic Metabolic Panel:  Recent Labs Lab 08/27/15 2008 08/28/15 0359 08/29/15 0544  NA 136 135 140  K 3.9 3.9 4.0  CL 103 102 104  CO2 25 27 31   GLUCOSE 389* 355* 95  BUN 10 12 17   CREATININE 0.79 0.63 0.71  CALCIUM 8.5* 8.7* 9.0  MG  --  1.9  --   PHOS  --  3.6  --    GFR: Estimated Creatinine Clearance: 44.1 mL/min (by C-G formula based on Cr of 0.71). Liver Function Tests:  Recent Labs Lab 08/28/15 0359  AST 19  ALT 24  ALKPHOS 49  BILITOT 0.4  PROT 5.6*    ALBUMIN 3.3*   No results for input(s): LIPASE, AMYLASE in the last 168 hours. No results for input(s): AMMONIA in the last 168 hours. Coagulation Profile: No results for input(s): INR, PROTIME in the last 168 hours. Cardiac Enzymes:  Recent Labs Lab 08/28/15 0602 08/28/15 1219 08/28/15 1924 08/28/15 2326 08/29/15 0544  TROPONINI 0.13* 0.27* 0.22* 0.21* 0.17*   BNP (last 3 results) No results for input(s): PROBNP in the last 8760 hours. HbA1C: No results for input(s): HGBA1C in the last 72 hours. CBG:  Recent Labs Lab 08/28/15 1136 08/28/15 1728 08/28/15 2211 08/29/15 0744 08/29/15 1159  GLUCAP 156* 88 102* 82 74   Lipid Profile: No results for input(s): CHOL, HDL, LDLCALC, TRIG, CHOLHDL, LDLDIRECT in the last 72 hours.  Thyroid Function Tests:  Recent Labs  08/28/15 0359  TSH 0.766   Anemia Panel: No results for input(s): VITAMINB12, FOLATE, FERRITIN, TIBC, IRON, RETICCTPCT in the last 72 hours. Sepsis Labs: No results for input(s): PROCALCITON, LATICACIDVEN in the last 168 hours.  No results found for this or any previous visit (from the past 240 hour(s)).       Radiology Studies: Dg Chest 2 View  08/27/2015  CLINICAL DATA:  Shortness of breath and bilateral lower extremity swelling. EXAM: CHEST  2 VIEW COMPARISON:  08/20/2013 chest radiograph. FINDINGS: Partially visualized right shoulder hemiarthroplasty. Stable cardiomediastinal silhouette with top-normal heart size and moderate to large hiatal hernia. No pneumothorax. No pleural effusion. No pulmonary edema. No acute consolidative airspace disease. Coronary stents overlie the heart on lateral view. Vertebroplasty material is noted within several mid thoracic vertebral bodies, unchanged. Moderate thoracic spondylosis. IMPRESSION: 1. No active cardiopulmonary disease. 2. Stable moderate to large hiatal hernia. Electronically Signed   By: Delbert Phenix M.D.   On: 08/27/2015 19:41   Ct Angio Chest Pe W Or Wo  Contrast  08/27/2015  CLINICAL DATA:  Acute onset of shortness of breath and bilateral leg swelling. Abdominal distention. Initial encounter. EXAM: CT ANGIOGRAPHY CHEST WITH CONTRAST TECHNIQUE: Multidetector CT imaging of the chest was performed using the standard protocol during bolus administration of intravenous contrast. Multiplanar CT image reconstructions and MIPs were obtained to evaluate the vascular anatomy. CONTRAST:  65 mL of Isovue 370 IV contrast COMPARISON:  CTA of the chest performed 01/21/2014, and chest radiograph performed earlier today at 7:28 p.m. FINDINGS: There is no evidence of pulmonary embolus. Mild bibasilar atelectasis is noted. The lungs are otherwise grossly clear. There is no evidence of significant focal consolidation, pleural effusion or pneumothorax. No masses are identified; no abnormal focal contrast enhancement is seen. A moderate to large hiatal hernia is noted. Diffuse coronary artery calcifications are seen. Visualized mediastinal nodes remain borderline normal in size. No pericardial effusion is seen. Diffuse calcification is noted along the thoracic aorta and proximal great vessels. A 1.8 cm hypodensity at the right thyroid lobe is relatively stable and likely benign. No axillary lymphadenopathy is seen. The visualized portions of the liver are unremarkable. Nonspecific calcified granulomata within the spleen are relatively stable in appearance. Vague heterogeneity within the spleen is thought to reflect the phase of contrast enhancement, and is similar to 2015. The patient is status post cholecystectomy. The visualized portions of the pancreas and adrenal glands are within normal limits. No acute osseous abnormalities are seen. Degenerative change is noted about the patient's right shoulder arthroplasty, with chronic superior subluxation. Chronic compression deformities are noted at the mid thoracic spine, at T7-T9, with associated changes of vertebroplasty. Review of the  MIP images confirms the above findings. IMPRESSION: 1. No evidence of pulmonary embolus. 2. Mild bibasilar atelectasis noted.  Lungs otherwise grossly clear. 3. Moderate to large hiatal hernia seen. 4. Diffuse coronary artery calcifications seen. 5. Diffuse calcification along the thoracic aorta and proximal great vessels. 6. 1.8 cm hypodensity at the right thyroid lobe is relatively stable and likely benign. 7. Chronic compression deformities at the mid thoracic spine, at T7-T9, with associated changes of vertebroplasty. 8. Degenerative change about the right shoulder arthroplasty, with chronic superior subluxation. Electronically Signed   By: Roanna Raider M.D.   On: 08/27/2015 22:52        Scheduled Meds: . aspirin EC  81 mg Oral QODAY  . docusate sodium  100 mg Oral Daily  .  doxycycline  100 mg Oral Q12H  . enoxaparin (LOVENOX) injection  40 mg Subcutaneous Q24H  . escitalopram  5 mg Oral QPM  . fluticasone furoate-vilanterol  1 puff Inhalation Daily  . furosemide  20 mg Oral BID  . guaiFENesin  600 mg Oral Q1200  . hydroxychloroquine  300 mg Oral Daily  . insulin aspart  0-15 Units Subcutaneous TID WC  . insulin aspart  0-5 Units Subcutaneous QHS  . ipratropium-albuterol  3 mL Nebulization TID  . isosorbide mononitrate  30 mg Oral Daily  . pantoprazole  40 mg Oral Daily  . sodium chloride flush  3 mL Intravenous Q12H   Continuous Infusions:    LOS: 1 day    Maziah Keeling, Scheryl Marten, MD [facility] Pager [phone]  If 7PM-7AM, please contact night-coverage [email or web address] Password [email or web address] 08/29/2015, 2:46 PM

## 2015-08-30 ENCOUNTER — Inpatient Hospital Stay (HOSPITAL_COMMUNITY): Payer: Medicare Other

## 2015-08-30 LAB — GLUCOSE, CAPILLARY
GLUCOSE-CAPILLARY: 116 mg/dL — AB (ref 65–99)
GLUCOSE-CAPILLARY: 224 mg/dL — AB (ref 65–99)
Glucose-Capillary: 145 mg/dL — ABNORMAL HIGH (ref 65–99)
Glucose-Capillary: 111 mg/dL — ABNORMAL HIGH (ref 65–99)

## 2015-08-30 LAB — BASIC METABOLIC PANEL
ANION GAP: 6 (ref 5–15)
BUN: 15 mg/dL (ref 6–20)
CALCIUM: 8.3 mg/dL — AB (ref 8.9–10.3)
CO2: 33 mmol/L — ABNORMAL HIGH (ref 22–32)
CREATININE: 0.68 mg/dL (ref 0.44–1.00)
Chloride: 98 mmol/L — ABNORMAL LOW (ref 101–111)
Glucose, Bld: 143 mg/dL — ABNORMAL HIGH (ref 65–99)
POTASSIUM: 3.1 mmol/L — AB (ref 3.5–5.1)
SODIUM: 137 mmol/L (ref 135–145)

## 2015-08-30 MED ORDER — PANTOPRAZOLE SODIUM 40 MG PO TBEC
40.0000 mg | DELAYED_RELEASE_TABLET | Freq: Every day | ORAL | Status: DC
Start: 2015-08-31 — End: 2015-08-30

## 2015-08-30 MED ORDER — OMEPRAZOLE 20 MG PO CPDR
20.0000 mg | DELAYED_RELEASE_CAPSULE | Freq: Every day | ORAL | Status: DC
  Administered 2015-08-30 – 2015-08-31 (×2): 20 mg via ORAL
  Filled 2015-08-30 (×2): qty 1

## 2015-08-30 MED ORDER — SORBITOL 70 % SOLN
30.0000 mL | Status: AC
  Administered 2015-08-30: 30 mL via ORAL
  Filled 2015-08-30: qty 30

## 2015-08-30 MED ORDER — POTASSIUM CHLORIDE CRYS ER 20 MEQ PO TBCR
40.0000 meq | EXTENDED_RELEASE_TABLET | Freq: Two times a day (BID) | ORAL | Status: AC
  Administered 2015-08-30 (×2): 40 meq via ORAL
  Filled 2015-08-30 (×2): qty 2

## 2015-08-30 NOTE — Evaluation (Addendum)
Physical Therapy Evaluation Patient Details Name: [name] MRN: [id number] DOB: 03/05/1930 Today's Date: 08/30/2015   History of Present Illness  y.o. female with medical history significant of DM 2, COPD, frequent falls, rheumatoid arthritis, coronary artery disease history of recurrent DVTs. PAD that is post right iliac stenting, AAA status post repair, HTN, CVA, Barrett esophagus. Presented 7/7/17with worsening shortness of breath compared to baseline. Doppler negative for DVT, negative for  PE  Clinical Impression  The patient c/o nausea. Attempted oxygen saturation test. Difficult to register dureing mobility. Ambulated initially on RA, afte 30' replaced to 2 liters as sats reading 78&(not good waveform). When returned to room, stas reading 93% on 2 liters. Will try again for saturation test.Pt admitted with above diagnosis. Pt currently with functional limitations due to the deficits listed below (see PT Problem List).  Pt will benefit from skilled PT to increase their independence and safety with mobility to allow discharge to the venue listed below.      Follow Up Recommendations Recommend HH PT follow up but patient declines HH and SNF)    Equipment Recommendations  None recommended by PT    Recommendations for Other Services       Precautions / Restrictions Precautions Precautions: Fall Precaution Comments: monitor sats- Try oxygen saturation test again      Mobility  Bed Mobility Overal bed mobility: Modified Independent                Transfers Overall transfer level: Needs assistance Equipment used: Rolling walker (2 wheeled) Transfers: Sit to/from Stand Sit to Stand: Min assist         General transfer comment: steady assist to stand  Ambulation/Gait Ambulation/Gait assistance: Min assist Ambulation Distance (Feet): 60 Feet Assistive device: Rolling walker (2 wheeled) Gait Pattern/deviations: Step-through pattern     General Gait Details: slow  speed, C/O nausea.   Stairs            Wheelchair Mobility    Modified Rankin (Stroke Patients Only)       Balance Overall balance assessment: Needs assistance;History of Falls Sitting-balance support: Bilateral upper extremity supported;Feet supported Sitting balance-Leahy Scale: Good     Standing balance support: Bilateral upper extremity supported;During functional activity Standing balance-Leahy Scale: Fair                               Pertinent Vitals/Pain Pain Assessment: No/denies pain    Home Living Family/patient expects to be discharged to:: Private residence Living Arrangements: Other relatives Available Help at Discharge: Family;Available 24 hours/day;Personal care attendant Type of Home: House Home Access: Stairs to enter Entrance Stairs-Rails: Doctor, general practice of Steps: 2 Home Layout: One level Home Equipment: Walker - 2 wheels;Cane - single point;Walker - 4 wheels;Bedside commode Additional Comments: granddaughter lives with patient but travels, has another person 3 hours /day     Prior Function Level of Independence: Needs assistance      ADL's / Homemaking Assistance Needed: granddaughter assists or x pca        Hand Dominance        Extremity/Trunk Assessment   Upper Extremity Assessment: Generalized weakness           Lower Extremity Assessment: Generalized weakness      Cervical / Trunk Assessment: Kyphotic  Communication   Communication: No difficulties  Cognition Arousal/Alertness: Awake/alert Behavior During Therapy: Anxious Overall Cognitive Status: Within Functional Limits for tasks assessed  General Comments      Exercises        Assessment/Plan    PT Assessment Patient needs continued PT services  PT Diagnosis Difficulty walking   PT Problem List Decreased strength;Decreased activity tolerance;Decreased mobility;Cardiopulmonary status limiting  activity  PT Treatment Interventions DME instruction;Gait training;Functional mobility training;Therapeutic exercise;Therapeutic activities;Wheelchair mobility training   PT Goals (Current goals can be found in the Care Plan section) Acute Rehab PT Goals Patient Stated Goal: to go home PT Goal Formulation: With patient Time For Goal Achievement: 09/13/15 Potential to Achieve Goals: Good    Frequency Min 3X/week   Barriers to discharge        Co-evaluation               End of Session Equipment Utilized During Treatment: Gait belt Activity Tolerance: Patient limited by fatigue Patient left: in bed;with call bell/phone within reach;with bed alarm set Nurse Communication: Mobility status         Time: 7846-9629 PT Time Calculation (min) (ACUTE ONLY): 28 min   Charges:   PT Evaluation $PT Eval Low Complexity: 1 Procedure PT Treatments $Gait Training: 8-22 mins   PT G Codes:        Rada Hay 08/30/2015, 3:03 PM Blanchard Kelch PT [phone]

## 2015-08-30 NOTE — Progress Notes (Signed)
When walking with patient to the bathroom.  O2 saturation dropped down to the mid 80's on RA. 1 L [place] placed back on, o2 saturations rose to 92%.

## 2015-08-30 NOTE — Progress Notes (Signed)
PROGRESS NOTE    [name]  GUY:[id number] DOB: September 23, 1930 DOA: 08/27/2015 PCP: Hoyle Sauer, MD    Brief Narrative:  82 y.o. female with medical history significant of DM 2, COPD, frequent falls, rheumatoid arthritis, coronary artery disease history of recurrent DVT no longer on anticoagulation secondary to falls. PAD that is post right iliac stenting, AAA status post repair, HTN, CVA, Barrett esophagus. Presented with worsening shortness of breath compared to baseline. She's been having worsening abdominal distention. And also worsening leg edema she is currently receiving palliative care. She had not endorse any fevers or chills. She reports while in ED she had some chest pain it was substernal and lasted a few min. she's been taking all her medications and recently had not had any changes to them. She was seen today by home health nurse was sent to emergency department. Patient reports some weight gain. She reports some swelling in her legs bilaterally and in her abdomen in the past 3 days.   Assessment & Plan:   Active Problems:   Essential hypertension   Rheumatoid arthritis (HCC)   CAD S/P PCI RCA & Circ with DES   DM (diabetes mellitus), type 2, uncontrolled with complications (HCC)   Long term (current) use of anticoagulants   DVT, Right Femoral, "chronic" -  with extension to the peroneal   PAD (peripheral artery disease) (HCC)   Protein-calorie malnutrition (HCC)   COPD exacerbation (HCC)   Elevated troponin   Leg swelling   Chest pain   Acute on chronic diastolic heart failure (HCC)   . COPD exacerbation (HCC) -  - started Steroid taper, antibiotics, Albuterol PRN, scheduled Atrovent, Dulera and Mucinex.  - Continue to wean O2 as tolerated  - Clinically improving   . DM (diabetes mellitus), type 2, uncontrolled with complications (HCC)  - Continue patient with a sliding scale monitor blood sugars   . history of DVT, Right Femoral, "chronic"  - patient  currently off anticoagulation secondary to recurrent falls and history of subdural hematoma 2 years ago  - Lower extremity Dopplers done and is neg for DVT  . Essential hypertension  - stable  - continue home medications   . PAD (peripheral artery disease) (HCC)  - stable at present    . Protein-calorie malnutrition (HCC) - nutritional consult ordered  . Rheumatoid arthritis (HCC)  - chronic continue home medications currently stable   chest pain atypical  - Troponin increased from 0.03 on admission, currently 0.27 presently asymptomatic. EKG unremarkable for ischemic change - Trop now trending downward  . Elevated troponin - likely secondary to demand ischemia, atypical chest pain, - On admission, patient states she does not wish to have cardiac catheterization performed in case of MI.  - Instead, patient would be amendable to nonaggressive intervention such as aspirin or heparin given dyspnea and apparent fluid gain  - 2-D echo performed noting an EF of 60-65% with no wall motion abnormality.  Mild acute on chronic diastolic heart failure  - gentle diuresis.  - Continue to monitor ins and outs and daily weights. Also monitor renal function closely.  Constipation - Abd xray with stool seen in ascending colon per my read - Will give trial of sorbitol   DVT prophylaxis: Lovenox subcutaneous Code Status: DO NOT RESUSCITATE Family Communication: Patient in room, family not at bedside Disposition Plan: Uncertain at this time   Consultants:     Procedures:     Antimicrobials:  Antibiotics Given (last 72 hours)  Date/Time Action Medication Dose   08/28/15 0226 Given   doxycycline (VIBRA-TABS) tablet 100 mg 100 mg   08/28/15 1026 Given   hydroxychloroquine (PLAQUENIL) tablet 300 mg 300 mg   08/28/15 1027 Given   doxycycline (VIBRA-TABS) tablet 100 mg 100 mg   08/28/15 2137 Given   doxycycline (VIBRA-TABS) tablet 100 mg 100 mg   08/29/15 1122 Given    hydroxychloroquine (PLAQUENIL) tablet 300 mg 300 mg   08/29/15 1123 Given   doxycycline (VIBRA-TABS) tablet 100 mg 100 mg   08/29/15 2155 Given   doxycycline (VIBRA-TABS) tablet 100 mg 100 mg   08/30/15 0908 Given   doxycycline (VIBRA-TABS) tablet 100 mg 100 mg   08/30/15 9357 Given   hydroxychloroquine (PLAQUENIL) tablet 300 mg 300 mg          Subjective: Complains of nausea this am  Objective: Filed Vitals:   08/30/15 0500 08/30/15 0736 08/30/15 0918 08/30/15 1328  BP:   122/61 116/80  Pulse:    79  Temp:    98.9 F (37.2 C)  TempSrc:    Oral  Resp:    18  Height:      Weight: 51.8 kg (114 lb 3.2 oz)     SpO2:  98%  95%    Intake/Output Summary (Last 24 hours) at 08/30/15 1339 Last data filed at 08/30/15 1322  Gross per 24 hour  Intake    220 ml  Output   2000 ml  Net  -1780 ml   Filed Weights   08/28/15 0148 08/29/15 0558 08/30/15 0500  Weight: 53.8 kg (118 lb 9.7 oz) 52.4 kg (115 lb 8.3 oz) 51.8 kg (114 lb 3.2 oz)    Examination:  General exam: Appears calm and comfortable, Sitting in chair  Respiratory system: Clear to auscultation. Respiratory effort normal. Cardiovascular system: S1 & S2 heard, RRR Gastrointestinal system: Abdomen is nondistended, soft and nontender. No organomegaly or masses felt. Normal bowel sounds heard. Central nervous system: Alert and oriented. No focal neurological deficits. Extremities: Symmetric 5 x 5 power. Skin: No rashes, lesions Psychiatry: Judgement and insight appear normal. Mood & affect appropriate.     Data Reviewed: I have personally reviewed following labs and imaging studies  CBC:  Recent Labs Lab 08/27/15 2008 08/28/15 0359  WBC 6.0 7.6  NEUTROABS 5.2  --   HGB 11.6* 11.5*  HCT 34.8* 33.9*  MCV 94.3 93.4  PLT 184 177   Basic Metabolic Panel:  Recent Labs Lab 08/27/15 2008 08/28/15 0359 08/29/15 0544 08/30/15 0401  NA 136 135 140 137  K 3.9 3.9 4.0 3.1*  CL 103 102 104 98*  CO2 25 27 31   33*  GLUCOSE 389* 355* 95 143*  BUN 10 12 17 15   CREATININE 0.79 0.63 0.71 0.68  CALCIUM 8.5* 8.7* 9.0 8.3*  MG  --  1.9  --   --   PHOS  --  3.6  --   --    GFR: Estimated Creatinine Clearance: 43.6 mL/min (by C-G formula based on Cr of 0.68). Liver Function Tests:  Recent Labs Lab 08/28/15 0359  AST 19  ALT 24  ALKPHOS 49  BILITOT 0.4  PROT 5.6*  ALBUMIN 3.3*   No results for input(s): LIPASE, AMYLASE in the last 168 hours. No results for input(s): AMMONIA in the last 168 hours. Coagulation Profile: No results for input(s): INR, PROTIME in the last 168 hours. Cardiac Enzymes:  Recent Labs Lab 08/28/15 0602 08/28/15 1219 08/28/15 1924 08/28/15 2326 08/29/15  0544  TROPONINI 0.13* 0.27* 0.22* 0.21* 0.17*   BNP (last 3 results) No results for input(s): PROBNP in the last 8760 hours. HbA1C: No results for input(s): HGBA1C in the last 72 hours. CBG:  Recent Labs Lab 08/29/15 1159 08/29/15 1643 08/29/15 2212 08/30/15 0745 08/30/15 1209  GLUCAP 74 162* 130* 116* 145*   Lipid Profile: No results for input(s): CHOL, HDL, LDLCALC, TRIG, CHOLHDL, LDLDIRECT in the last 72 hours. Thyroid Function Tests:  Recent Labs  08/28/15 0359  TSH 0.766   Anemia Panel: No results for input(s): VITAMINB12, FOLATE, FERRITIN, TIBC, IRON, RETICCTPCT in the last 72 hours. Sepsis Labs: No results for input(s): PROCALCITON, LATICACIDVEN in the last 168 hours.  No results found for this or any previous visit (from the past 240 hour(s)).       Radiology Studies: Dg Abd Portable 1v  08/30/2015  CLINICAL DATA:  Constipation EXAM: PORTABLE ABDOMEN - 1 VIEW COMPARISON:  CT abdomen and pelvis 12/02/2013 FINDINGS: Normal stool burden RIGHT colon. Paucity of stool throughout remainder of colon. Gas within rectum. Nonobstructive bowel gas pattern without bowel dilatation or wall thickening. Atherosclerotic calcification iliac arteries with stent at RIGHT common iliac artery. Surgical  clips RIGHT upper quadrant likely cholecystectomy. Single tiny calculus projects over upper pole RIGHT kidney. Marked osseous demineralization with LEFT hip prosthesis and proximal RIGHT femoral hardware post ORIF. IMPRESSION: Normal bowel gas pattern. Electronically Signed   By: Ulyses Southward M.D.   On: 08/30/2015 12:36        Scheduled Meds: . aspirin EC  81 mg Oral QODAY  . docusate sodium  100 mg Oral Daily  . doxycycline  100 mg Oral Q12H  . enoxaparin (LOVENOX) injection  40 mg Subcutaneous Q24H  . escitalopram  5 mg Oral QPM  . fluticasone furoate-vilanterol  1 puff Inhalation Daily  . furosemide  20 mg Oral BID  . guaiFENesin  600 mg Oral Q1200  . hydroxychloroquine  300 mg Oral Daily  . insulin aspart  0-15 Units Subcutaneous TID WC  . insulin aspart  0-5 Units Subcutaneous QHS  . ipratropium-albuterol  3 mL Nebulization TID  . isosorbide mononitrate  30 mg Oral Daily  . pantoprazole  40 mg Oral Daily  . potassium chloride  40 mEq Oral BID  . sodium chloride flush  3 mL Intravenous Q12H   Continuous Infusions:    LOS: 2 days    CHIU, Scheryl Marten, MD [facility] Pager [phone]  If 7PM-7AM, please contact night-coverage [email or web address] Password [email or web address] 08/30/2015, 1:39 PM

## 2015-08-31 DIAGNOSIS — Z7189 Other specified counseling: Secondary | ICD-10-CM | POA: Insufficient documentation

## 2015-08-31 DIAGNOSIS — Z515 Encounter for palliative care: Secondary | ICD-10-CM

## 2015-08-31 LAB — BASIC METABOLIC PANEL
ANION GAP: 6 (ref 5–15)
BUN: 13 mg/dL (ref 6–20)
CALCIUM: 8.5 mg/dL — AB (ref 8.9–10.3)
CO2: 31 mmol/L (ref 22–32)
Chloride: 101 mmol/L (ref 101–111)
Creatinine, Ser: 0.78 mg/dL (ref 0.44–1.00)
Glucose, Bld: 121 mg/dL — ABNORMAL HIGH (ref 65–99)
POTASSIUM: 3.7 mmol/L (ref 3.5–5.1)
Sodium: 138 mmol/L (ref 135–145)

## 2015-08-31 LAB — GLUCOSE, CAPILLARY
GLUCOSE-CAPILLARY: 112 mg/dL — AB (ref 65–99)
GLUCOSE-CAPILLARY: 277 mg/dL — AB (ref 65–99)

## 2015-08-31 MED ORDER — BISACODYL 5 MG PO TBEC: 5.0000 mg | DELAYED_RELEASE_TABLET | Freq: Every day | ORAL | Status: AC | PRN

## 2015-08-31 MED ORDER — BLOOD GLUCOSE MONITOR KIT: PACK | Status: AC

## 2015-08-31 MED ORDER — METFORMIN HCL 500 MG PO TABS: 500.0000 mg | ORAL_TABLET | Freq: Two times a day (BID) | ORAL | Status: AC

## 2015-08-31 MED ORDER — DOXYCYCLINE HYCLATE 100 MG PO TABS: 100.0000 mg | ORAL_TABLET | Freq: Two times a day (BID) | ORAL | Status: AC

## 2015-08-31 NOTE — Care Management Important Message (Signed)
Important Message  Patient Details  Name: BREEONA WAID MRN: [id number] Date of Birth: [birth year]   Medicare Important Message Given:  Yes    Haskell Flirt 08/31/2015, 11:59 AMImportant Message  Patient Details  Name: GLENNDA WEATHERHOLTZ MRN: [id number] Date of Birth: 1931/01/06   Medicare Important Message Given:  Yes    Haskell Flirt 08/31/2015, 11:59 AM

## 2015-08-31 NOTE — Consult Note (Signed)
Consultation Note Date: 08/31/2015   Patient Name: [name]  DOB: [birth year]  MRN: [id number]  Age / Sex: 82 y.o., female  PCP: Chilton Greathouse, MD Referring Physician: Jerald Kief, MD  Reason for Consultation: Establishing goals of care  HPI/Patient Profile: 82 y.o. female   admitted on 08/27/2015    Clinical Assessment and Goals of Care:  82 year old lady who lives at home with assistance from her granddaughter. She has a host of chronic medical conditions such as diabetes, COPD, frequent falls, coronary artery disease, history of recurrent DVTs no longer on anticoagulation secondary to falls, peripheral arterial disease status post right iliac stenting, hypertension, Barrett's esophagus.  Patient has been admitted this time with shortness of breath deemed to secondary to a component of chronic obstructive pulmonary disease exacerbation as well as possible acute on chronic diastolic heart failure exacerbation.  Patient is deemed appropriate for discharge today. Palliative care consultation for ongoing goals of care discussions. It is reported that the patient was enrolled in palliative services through hospice and palliative care center of [place].  Patient is an elderly lady resting in bed. She states she likes to eat soup. She had only one daughter who is deceased. Her granddaughter Babette Relic helps take care of her. She denies any pain. She denies any other symptoms. She states that she is not short of breath currently.  Call placed and discussed with patient's daughter Tammy who subsequently arrived at the bedside later this afternoon. I introduced palliative care as follows: Palliative medicine is specialized medical care for people living with serious illness. It focuses on providing relief from the symptoms and stress of a serious illness. The goal is to improve quality of life for both the patient  and the family.  Tammy is well aware of palliative services. She follows closely with [facility] palliative health care provider through hospice and palliative care of [place]. She is appreciative of palliative services. Patient has ongoing gradual functional decline. Patient has ongoing decline in oral intake. She has a host of chronic conditions. She is at high risk for decompensation. Prognosis might could be less than 6 months. Recommend full hospice services at discharge. See recommendations below. Thank you for the consult.  HCPOA  grand daughter Tammy at [phone]  SUMMARY OF RECOMMENDATIONS    Recommend discharge home with hospice services. Patient was recently enrolled in home-based palliative care through hospice and palliative care of [place], [place].   Agree with continuation of DO NOT RESUSCITATE/DO NOT INTUBATE.   Gentle medical treatments. Continue to monitor disease trajectory.   Recommend social work involvement at home through hospice agency, healthcare power of attorney agent granddaughter Tammy with significant caregiver burden.  Code Status/Advance Care Planning:  DNR    Symptom Management:    continue current care.   Palliative Prophylaxis:   Bowel Regimen  Psycho-social/Spiritual:   Desire for further Chaplaincy support:no  Additional Recommendations: Caregiving  Support/Resources  Prognosis:   < 6 months  Discharge Planning: Home with Hospice is  highly recommended.       Primary Diagnoses: Present on Admission:  . DM (diabetes mellitus), type 2, uncontrolled with complications (HCC) . DVT, Right Femoral, "chronic" -  with extension to the peroneal . Essential hypertension . PAD (peripheral artery disease) (HCC) . Protein-calorie malnutrition (HCC) . Rheumatoid arthritis (HCC) . COPD exacerbation (HCC) . Elevated troponin . Leg swelling . Chest pain . Acute on chronic diastolic heart failure (HCC)  I have reviewed the  medical record, interviewed the patient and family, and examined the patient. The following aspects are pertinent.  Past Medical History  Diagnosis Date  . History of  Angina     Treated with PCI in 2004; no active Symptoms.  . CAD S/P percutaneous coronary angioplasty 2004    PCI-RCA (2.3mm x 12mm Taxus DES)& Cx (2.75 mm x 14 mm Cypher DES)  . PAD (peripheral artery disease) (HCC) 1994    History of right iliac stenting, fem-fem bypass in 94; aortobifem bypass/ AAA repair - November 2004; latest ABIs of been roughly 1 bilaterally, followed by Dr. Arbie Cookey  . Essential hypertension     Labile  . Hyperlipidemia LDL goal <70     Statin intolerant  . History of ischemic stroke without residual deficits     Diagnosed by Radiographic Imaging  . DVT, femoral, chronic (HCC)     Bilateral -- was on chronic Warfarin until fall with subdural hematoma in 2015  . COPD mixed type [facility]) CT Chest 01/2014    Former Smoker; Centrilobular emphysema with bronchiectasis  . Chronic pneumonia (HCC)     Several "Bouts"  . Shortness of breath     Chronic MILD- WITH EXERTION. chronic  . Aortic atherosclerosis (HCC)     Noted on CT scan. Also with aortobifem and AAA repair  . Bilateral edema of lower extremity   . Gastroesophageal reflux disease with hiatal hernia   . Barrett esophagus   . Osteoarthritis of both hips      status post multiple hip operations, recent fall with fracture.    [name] DJD (degenerative joint disease)   . Rheumatoid arthritis (HCC)   . Fall from slip, trip, or stumble 08/20/2013  . Closed hip fracture (HCC)     Right  . Neuropathy (HCC)   . Iron deficiency anemia   . Osteoporosis   . Shoulder pain, right     RIGHT SHOULDER WITH LIMITED ROM--STATES PREVIOUS RT TOTAL SHOULDDER REPLACEMENT-BUT STILL HAS PROBLEMS WITH SHOULDER  . History of blood transfusion   . H/O migraine     "used to have them; not anymore" (08/22/2013)   Social History   Social History  . Marital Status: Widowed      Spouse Name: N/A  . Number of Children: N/A  . Years of Education: N/A   Occupational History  . retired    Social History Main Topics  . Smoking status: Former Smoker -- 1.00 packs/day for 50 years    Types: Cigarettes    Quit date: 04/20/2000  . Smokeless tobacco: Never Used  . Alcohol Use: No  . Drug Use: No  . Sexual Activity: No   Other Topics Concern  . None   Social History Narrative    She is currently living back at home after a brief stay at [facility] for postoperative rehab.  Currently doing physical therapy.       She is widowed, and has no living children.  One of her granddaughters is with her.   She has  2 grandchildren and 1 great-grandchild. Does not smoke, does not drink.    Family History  Problem Relation Age of Onset  . Prostate cancer Father   . Diabetes Mother   . Colon cancer Neg Hx   . Breast cancer Mother   . Heart disease Mother    Scheduled Meds: . aspirin EC  81 mg Oral QODAY  . docusate sodium  100 mg Oral Daily  . doxycycline  100 mg Oral Q12H  . enoxaparin (LOVENOX) injection  40 mg Subcutaneous Q24H  . escitalopram  5 mg Oral QPM  . fluticasone furoate-vilanterol  1 puff Inhalation Daily  . furosemide  20 mg Oral BID  . guaiFENesin  600 mg Oral Q1200  . hydroxychloroquine  300 mg Oral Daily  . insulin aspart  0-15 Units Subcutaneous TID WC  . insulin aspart  0-5 Units Subcutaneous QHS  . ipratropium-albuterol  3 mL Nebulization TID  . isosorbide mononitrate  30 mg Oral Daily  . omeprazole  20 mg Oral Daily  . sodium chloride flush  3 mL Intravenous Q12H   Continuous Infusions:  PRN Meds:.acetaminophen **OR** acetaminophen, albuterol, bisacodyl, hydrALAZINE, HYDROcodone-acetaminophen, ondansetron **OR** ondansetron (ZOFRAN) IV, polyethylene glycol, polyvinyl alcohol, traMADol Medications Prior to Admission:  Prior to Admission medications   Medication Sig Start Date End Date Taking? Authorizing Provider  albuterol (PROVENTIL  HFA;VENTOLIN HFA) 108 (90 BASE) MCG/ACT inhaler Inhale 2 puffs into the lungs every 6 (six) hours as needed for wheezing or shortness of breath.   Yes Historical Provider, MD  aspirin EC 81 MG tablet Take 81 mg by mouth every other day.   Yes Historical Provider, MD  Camphor-Menthol-Methyl Sal (SALONPAS) 1.2-5.7-6.3 % PTCH Place 1 patch onto the skin daily as needed (For pain.).   Yes Historical Provider, MD  docusate sodium (COLACE) 100 MG capsule Take 100 mg by mouth daily.    Yes Historical Provider, MD  escitalopram (LEXAPRO) 5 MG tablet Take 5 mg by mouth every evening.    Yes Historical Provider, MD  guaiFENesin (MUCINEX) 600 MG 12 hr tablet Take 600 mg by mouth daily at 12 noon.   Yes Historical Provider, MD  hydroxychloroquine (PLAQUENIL) 200 MG tablet Take 300 mg by mouth daily.    Yes Historical Provider, MD  hydroxypropyl methylcellulose / hypromellose (ISOPTO TEARS / GONIOVISC) 2.5 % ophthalmic solution Place 1-2 drops into both eyes 3 (three) times daily as needed for dry eyes.   Yes Historical Provider, MD  ipratropium-albuterol (DUONEB) 0.5-2.5 (3) MG/3ML SOLN Take 3 mLs by nebulization 3 (three) times daily.   Yes Historical Provider, MD  isosorbide mononitrate (IMDUR) 30 MG 24 hr tablet Take 1 tablet (30 mg total) by mouth daily. 04/15/14  Yes Marykay Lex, MD  Multiple Vitamin (MULTIVITAMIN WITH MINERALS) TABS tablet Take 1 tablet by mouth daily at 12 noon.   Yes Historical Provider, MD  omeprazole (PRILOSEC OTC) 20 MG tablet Take 20 mg by mouth every morning.    Yes Historical Provider, MD  predniSONE (DELTASONE) 10 MG tablet Take 10 mg by mouth daily.   Yes Historical Provider, MD  promethazine (PHENERGAN) 12.5 MG tablet Take 12.5 mg by mouth every 6 (six) hours as needed for nausea (Patient takes with Tramadol).   Yes Historical Provider, MD  traMADol (ULTRAM) 50 MG tablet Take 1 tablet (50 mg total) by mouth every 6 (six) hours as needed for moderate pain. 12/15/13  Yes Christiane Ha, MD  bisacodyl (DULCOLAX) 5 MG EC tablet  Take 1 tablet (5 mg total) by mouth daily as needed for moderate constipation. 08/31/15   Jerald Kief, MD  doxycycline (VIBRA-TABS) 100 MG tablet Take 1 tablet (100 mg total) by mouth every 12 (twelve) hours. 08/31/15   Jerald Kief, MD  fluticasone (FLONASE) 50 MCG/ACT nasal spray Place 2 sprays into both nostrils daily. Patient not taking: Reported on 05/08/2014 04/16/14   Kalman Shan, MD  Fluticasone Furoate-Vilanterol (BREO ELLIPTA) 100-25 MCG/INH AEPB Inhale 1 puff into the lungs daily. Patient not taking: Reported on 08/27/2015 04/16/14   Kalman Shan, MD  lactose free nutrition (BOOST PLUS) LIQD Take 237 mLs by mouth 2 (two) times daily between meals as needed (As requested by pt or family). Patient not taking: Reported on 08/27/2015 12/10/13   Theda Belfast Dhungel, MD  metFORMIN (GLUCOPHAGE) 500 MG tablet Take 1 tablet (500 mg total) by mouth 2 (two) times daily with a meal. 08/31/15   Jerald Kief, MD  polyethylene glycol (MIRALAX / Ethelene Hal) packet Take 17 g by mouth daily. Patient taking differently: Take 17 g by mouth daily as needed.  12/06/13   Rhetta Mura, MD   Allergies  Allergen Reactions  . Codeine Nausea And Vomiting, Swelling and Rash  . Hydrocodone Nausea And Vomiting, Swelling and Rash    "mouth peeled"  . Morphine And Related Other (See Comments)    hallucinations  . Other Nausea And Vomiting    Most pain meds! other than tramadol. Tylenol, Toradol, and Robaxin are ok.    . Penicillins Hives    Has patient had a PCN reaction causing immediate rash, facial/tongue/throat swelling, SOB or lightheadedness with hypotension:No Has patient had a PCN reaction causing severe rash involving mucus membranes or skin necrosis:YES Has patient had a PCN reaction that required hospitalization:NO Has patient had a PCN reaction occurring within the last 10 years:NO If all of the above answers are "NO", then may proceed  with Cephalosporin use.   . Demerol [Meperidine] Nausea And Vomiting  . Propoxyphene Hives and Nausea And Vomiting  . Statins Other (See Comments)    INTOLERANT OF STATINS - messes up her legs and has muscle aches  . Sulfasalazine   . Tape Other (See Comments)    Needs paper tape - skin very fragile   Review of Systems +for weakness generalized  Physical Exam Weak frail elderly lady resting in bed Baseline resting tremor evident S1-S2 Clear No edema Nonfocal answer several questions appropriately Vital Signs: BP 96/64 mmHg  Pulse 92  Temp(Src) 99 F (37.2 C) (Oral)  Resp 18  Ht 5\' 3"  (1.6 m)  Wt 49.669 kg (109 lb 8 oz)  BMI 19.40 kg/m2  SpO2 85% Pain Assessment: No/denies pain   Pain Score: 0-No pain   SpO2: SpO2: (!) 85 % ([place] was not connected after walk) O2 Device:SpO2: (!) 85 % ([place] was not connected after walk) O2 Flow Rate: .O2 Flow Rate (L/min): 2 L/min  IO: Intake/output summary:  Intake/Output Summary (Last 24 hours) at 08/31/15 1607 Last data filed at 08/30/15 2300  Gross per 24 hour  Intake    240 ml  Output      0 ml  Net    240 ml    LBM: Last BM Date: 08/31/15 Baseline Weight: Weight: 53.8 kg (118 lb 9.7 oz) Most recent weight: Weight: 49.669 kg (109 lb 8 oz)     Palliative Assessment/Data:   Flowsheet Rows        Most Recent Value  Intake Tab    Referral Department  Hospitalist   Unit at Time of Referral  Med/Surg Unit   Palliative Care Primary Diagnosis  Other (Comment)   Palliative Care Type  New Palliative care   Reason for referral  Clarify Goals of Care   Clinical Assessment    Palliative Performance Scale Score  40%   Pain Max last 24 hours  4   Pain Min Last 24 hours  3   Dyspnea Max Last 24 Hours  3   Dyspnea Min Last 24 hours  2   Psychosocial & Spiritual Assessment    Palliative Care Outcomes    Patient/Family meeting held?  Yes   Who was at the meeting?  hcpoa grand daughter Tammy    Palliative Care follow-up planned   Yes, Home   Palliative Care Follow-up Reason  Clarify goals of care      Time In: 1400 Time Out: 1500 Time Total: 60 min  Greater than 50%  of this time was spent counseling and coordinating care related to the above assessment and plan.  Signed by: Rosalin Hawking, MD  [phone]  Please contact Palliative Medicine Team phone at [phone] for questions and concerns.  For individual provider: See Loretha Stapler

## 2015-08-31 NOTE — Consult Note (Signed)
   [facility] CM Inpatient Consult   08/31/2015  [name] [birth year] [id number]   [facility] Care Management referral received. Spoke with patient at bedside who advised Clinical research associate to contact her granddaughter, [name] at [phone]. Spoke with Tammy who reports [name] is currently enrolled in the [facility] Palliative program, not hospice but palliative care. Call made to [facility] hospital liaison to make aware of bedside conversation with patient and granddaughter. [facility] Care Management not appropriate at this time. Made inpatient RNCM aware.   Raiford Noble, MSN-Ed, RN,BSN [facility] Liaison [phone]

## 2015-08-31 NOTE — Progress Notes (Signed)
SATURATION QUALIFICATIONS: (This note is used to comply with regulatory documentation for home oxygen)  Patient Saturations on Room Air at Rest = 85%  Patient Saturations on Room Air while Ambulating = 70%  Patient Saturations on 3 Liters of oxygen while Ambulating = 95%  Please briefly explain why patient needs home oxygen:

## 2015-08-31 NOTE — Progress Notes (Signed)
[name], patients granddaughter [phone]) would like to speak to pallative care MD.

## 2015-08-31 NOTE — Discharge Summary (Signed)
Physician Discharge Summary  [name] XVQ:[id number] DOB: 25-Sep-1930 DOA: 08/27/2015  PCP: Hoyle Sauer, MD  Admit date: 08/27/2015 Discharge date: 08/31/2015  Admitted From:Home Disposition:  Home  Recommendations for Outpatient Follow-up:  1. Follow up with PCP in 1-2 weeks   Home Health:Pt declines  Discharge Condition: Improved CODE STATUS: DNR Diet recommendation: Diabetic  Brief/Interim Summary: 82 y.o. female with medical history significant of DM 2, COPD, frequent falls, rheumatoid arthritis, coronary artery disease history of recurrent DVT no longer on anticoagulation secondary to falls. PAD that is post right iliac stenting, AAA status post repair, HTN, CVA, Barrett esophagus. Presented with worsening shortness of breath compared to baseline. She's been having worsening abdominal distention. And also worsening leg edema she is currently receiving palliative care. She had not endorse any fevers or chills. She reports while in ED she had some chest pain it was substernal and lasted a few min. she's been taking all her medications and recently had not had any changes to them. She was seen today by home health nurse was sent to emergency department. Patient reports some weight gain. She reports some swelling in her legs bilaterally and in her abdomen in the past 3 days.   [name] COPD exacerbation (HCC) -  - started Steroid taper, antibiotics, Albuterol PRN, scheduled Atrovent, Dulera and Mucinex.  - Continue to wean O2 as tolerated  - Clinically Improved, however patient did desaturate on room air and will therefore qualify for home O2.  . DM (diabetes mellitus), type 2, uncontrolled with complications (HCC)  - Continue patient with a sliding scale monitor blood sugars   . history of DVT, Right Femoral, "chronic"  - patient currently off anticoagulation secondary to recurrent falls and history of subdural hematoma 2 years ago  - Lower extremity Dopplers done and is neg  for DVT  . Essential hypertension  - Remained stable  - continued home medications   . PAD (peripheral artery disease) (HCC)  - stable at present   . Protein-calorie malnutrition (HCC) - nutritional consult ordered  . Rheumatoid arthritis (HCC)  - chronic continue home medications currently stable   chest pain atypical  - Troponin increased from 0.03 on admission, currently 0.27 presently asymptomatic. EKG unremarkable for ischemic change - Trop now trending downward  . Elevated troponin - likely secondary to demand ischemia, atypical chest pain, - On admission, patient states she does not wish to have cardiac catheterization performed in case of MI.  - Instead, patient would be amendable to nonaggressive intervention such as aspirin or heparin given dyspnea and apparent fluid gain  - 2-D echo performed noting an EF of 60-65% with no wall motion abnormality.  Mild acute on chronic diastolic heart failure  - gentle diuresis.  - Continue to monitor ins and outs and daily weights. Also monitor renal function closely.  Constipation - Abd xray with stool seen in ascending colon per my read - Good results with sorbitol  Possible diabetes mellitus -Patient with random glucose noted to be into the upper 200s. -Chart reviewed. Patient has prior history of glucose into the 400s. -Have started patient on a low dose of metformin. Have asked patient to check blood sugars. Recommend follow-up with PCP closely.  Discharge Diagnoses:  Active Problems:   Essential hypertension   Rheumatoid arthritis (HCC)   CAD S/P PCI RCA & Circ with DES   DM (diabetes mellitus), type 2, uncontrolled with complications (HCC)   Long term (current) use of anticoagulants   DVT, Right  Femoral, "chronic" -  with extension to the peroneal   PAD (peripheral artery disease) (HCC)   Protein-calorie malnutrition (HCC)   COPD exacerbation (HCC)   Elevated troponin   Leg swelling   Chest pain    Acute on chronic diastolic heart failure (HCC)      Medication List    STOP taking these medications        lactose free nutrition Liqd     predniSONE 10 MG tablet  Commonly known as:  DELTASONE     promethazine 12.5 MG tablet  Commonly known as:  PHENERGAN      TAKE these medications        albuterol 108 (90 Base) MCG/ACT inhaler  Commonly known as:  PROVENTIL HFA;VENTOLIN HFA  Inhale 2 puffs into the lungs every 6 (six) hours as needed for wheezing or shortness of breath.     aspirin EC 81 MG tablet  Take 81 mg by mouth every other day.     bisacodyl 5 MG EC tablet  Commonly known as:  DULCOLAX  Take 1 tablet (5 mg total) by mouth daily as needed for moderate constipation.     docusate sodium 100 MG capsule  Commonly known as:  COLACE  Take 100 mg by mouth daily.     doxycycline 100 MG tablet  Commonly known as:  VIBRA-TABS  Take 1 tablet (100 mg total) by mouth every 12 (twelve) hours.     escitalopram 5 MG tablet  Commonly known as:  LEXAPRO  Take 5 mg by mouth every evening.     fluticasone 50 MCG/ACT nasal spray  Commonly known as:  FLONASE  Place 2 sprays into both nostrils daily.     fluticasone furoate-vilanterol 100-25 MCG/INH Aepb  Commonly known as:  BREO ELLIPTA  Inhale 1 puff into the lungs daily.     guaiFENesin 600 MG 12 hr tablet  Commonly known as:  MUCINEX  Take 600 mg by mouth daily at 12 noon.     hydroxychloroquine 200 MG tablet  Commonly known as:  PLAQUENIL  Take 300 mg by mouth daily.     hydroxypropyl methylcellulose / hypromellose 2.5 % ophthalmic solution  Commonly known as:  ISOPTO TEARS / GONIOVISC  Place 1-2 drops into both eyes 3 (three) times daily as needed for dry eyes.     ipratropium-albuterol 0.5-2.5 (3) MG/3ML Soln  Commonly known as:  DUONEB  Take 3 mLs by nebulization 3 (three) times daily.     isosorbide mononitrate 30 MG 24 hr tablet  Commonly known as:  IMDUR  Take 1 tablet (30 mg total) by mouth daily.      metFORMIN 500 MG tablet  Commonly known as:  GLUCOPHAGE  Take 1 tablet (500 mg total) by mouth 2 (two) times daily with a meal.     multivitamin with minerals Tabs tablet  Take 1 tablet by mouth daily at 12 noon.     omeprazole 20 MG tablet  Commonly known as:  PRILOSEC OTC  Take 20 mg by mouth every morning.     polyethylene glycol packet  Commonly known as:  MIRALAX / GLYCOLAX  Take 17 g by mouth daily.     SALONPAS 1.2-5.7-6.3 % Ptch  Generic drug:  Camphor-Menthol-Methyl Sal  Place 1 patch onto the skin daily as needed (For pain.).     traMADol 50 MG tablet  Commonly known as:  ULTRAM  Take 1 tablet (50 mg total) by mouth every 6 (six) hours as  needed for moderate pain.           Follow-up Information    Follow up with Hoyle Sauer, MD. Schedule an appointment as soon as possible for a visit in 1 week.   Specialty:  Internal Medicine   Why:  Hospital follow up   Contact information:   [address] [place] [address] [phone]      Allergies  Allergen Reactions  . Codeine Nausea And Vomiting, Swelling and Rash  . Hydrocodone Nausea And Vomiting, Swelling and Rash    "mouth peeled"  . Morphine And Related Other (See Comments)    hallucinations  . Other Nausea And Vomiting    Most pain meds! other than tramadol. Tylenol, Toradol, and Robaxin are ok.    . Penicillins Hives    Has patient had a PCN reaction causing immediate rash, facial/tongue/throat swelling, SOB or lightheadedness with hypotension:No Has patient had a PCN reaction causing severe rash involving mucus membranes or skin necrosis:YES Has patient had a PCN reaction that required hospitalization:NO Has patient had a PCN reaction occurring within the last 10 years:NO If all of the above answers are "NO", then may proceed with Cephalosporin use.   . Demerol [Meperidine] Nausea And Vomiting  . Propoxyphene Hives and Nausea And Vomiting  . Statins Other (See Comments)     INTOLERANT OF STATINS - messes up her legs and has muscle aches  . Sulfasalazine   . Tape Other (See Comments)    Needs paper tape - skin very fragile    Consultations: Palliative care Procedures/Studies: Dg Chest 2 View  08/27/2015  CLINICAL DATA:  Shortness of breath and bilateral lower extremity swelling. EXAM: CHEST  2 VIEW COMPARISON:  08/20/2013 chest radiograph. FINDINGS: Partially visualized right shoulder hemiarthroplasty. Stable cardiomediastinal silhouette with top-normal heart size and moderate to large hiatal hernia. No pneumothorax. No pleural effusion. No pulmonary edema. No acute consolidative airspace disease. Coronary stents overlie the heart on lateral view. Vertebroplasty material is noted within several mid thoracic vertebral bodies, unchanged. Moderate thoracic spondylosis. IMPRESSION: 1. No active cardiopulmonary disease. 2. Stable moderate to large hiatal hernia. Electronically Signed   By: Delbert Phenix M.D.   On: 08/27/2015 19:41   Ct Angio Chest Pe W Or Wo Contrast  08/27/2015  CLINICAL DATA:  Acute onset of shortness of breath and bilateral leg swelling. Abdominal distention. Initial encounter. EXAM: CT ANGIOGRAPHY CHEST WITH CONTRAST TECHNIQUE: Multidetector CT imaging of the chest was performed using the standard protocol during bolus administration of intravenous contrast. Multiplanar CT image reconstructions and MIPs were obtained to evaluate the vascular anatomy. CONTRAST:  65 mL of Isovue 370 IV contrast COMPARISON:  CTA of the chest performed 01/21/2014, and chest radiograph performed earlier today at 7:28 p.m. FINDINGS: There is no evidence of pulmonary embolus. Mild bibasilar atelectasis is noted. The lungs are otherwise grossly clear. There is no evidence of significant focal consolidation, pleural effusion or pneumothorax. No masses are identified; no abnormal focal contrast enhancement is seen. A moderate to large hiatal hernia is noted. Diffuse coronary artery  calcifications are seen. Visualized mediastinal nodes remain borderline normal in size. No pericardial effusion is seen. Diffuse calcification is noted along the thoracic aorta and proximal great vessels. A 1.8 cm hypodensity at the right thyroid lobe is relatively stable and likely benign. No axillary lymphadenopathy is seen. The visualized portions of the liver are unremarkable. Nonspecific calcified granulomata within the spleen are relatively stable in appearance. Vague heterogeneity within the spleen is thought  to reflect the phase of contrast enhancement, and is similar to 2015. The patient is status post cholecystectomy. The visualized portions of the pancreas and adrenal glands are within normal limits. No acute osseous abnormalities are seen. Degenerative change is noted about the patient's right shoulder arthroplasty, with chronic superior subluxation. Chronic compression deformities are noted at the mid thoracic spine, at T7-T9, with associated changes of vertebroplasty. Review of the MIP images confirms the above findings. IMPRESSION: 1. No evidence of pulmonary embolus. 2. Mild bibasilar atelectasis noted.  Lungs otherwise grossly clear. 3. Moderate to large hiatal hernia seen. 4. Diffuse coronary artery calcifications seen. 5. Diffuse calcification along the thoracic aorta and proximal great vessels. 6. 1.8 cm hypodensity at the right thyroid lobe is relatively stable and likely benign. 7. Chronic compression deformities at the mid thoracic spine, at T7-T9, with associated changes of vertebroplasty. 8. Degenerative change about the right shoulder arthroplasty, with chronic superior subluxation. Electronically Signed   By: Roanna Raider M.D.   On: 08/27/2015 22:52   Dg Abd Portable 1v  08/30/2015  CLINICAL DATA:  Constipation EXAM: PORTABLE ABDOMEN - 1 VIEW COMPARISON:  CT abdomen and pelvis 12/02/2013 FINDINGS: Normal stool burden RIGHT colon. Paucity of stool throughout remainder of colon. Gas  within rectum. Nonobstructive bowel gas pattern without bowel dilatation or wall thickening. Atherosclerotic calcification iliac arteries with stent at RIGHT common iliac artery. Surgical clips RIGHT upper quadrant likely cholecystectomy. Single tiny calculus projects over upper pole RIGHT kidney. Marked osseous demineralization with LEFT hip prosthesis and proximal RIGHT femoral hardware post ORIF. IMPRESSION: Normal bowel gas pattern. Electronically Signed   By: Ulyses Southward M.D.   On: 08/30/2015 12:36       Subjective:   Discharge Exam: Filed Vitals:   08/31/15 0542 08/31/15 1255  BP: 145/68 96/64  Pulse: 86 92  Temp: 98.1 F (36.7 C) 99 F (37.2 C)  Resp: 18 18   Filed Vitals:   08/30/15 2143 08/31/15 0542 08/31/15 0749 08/31/15 1255  BP: 127/80 145/68  96/64  Pulse: 87 86  92  Temp: 98.6 F (37 C) 98.1 F (36.7 C)  99 F (37.2 C)  TempSrc: Oral Oral  Oral  Resp: Height:      Weight:  49.669 kg (109 lb 8 oz)    SpO2: 98% 93% 92% 92%    General: Pt is alert, awake, not in acute distress Cardiovascular: RRR, S1/S2 + Respiratory: CTA bilaterally, no wheezing, no rhonchi Abdominal: Soft, NT, ND, bowel sounds + Extremities: no edema, no cyanosis    The results of significant diagnostics from this hospitalization (including imaging, microbiology, ancillary and laboratory) are listed below for reference.     Microbiology: No results found for this or any previous visit (from the past 240 hour(s)).   Labs: BNP (last 3 results)  Recent Labs  08/27/15 2008  BNP 157.1*   Basic Metabolic Panel:  Recent Labs Lab 08/27/15 2008 08/28/15 0359 08/29/15 0544 08/30/15 0401 08/31/15 0450  NA 136 135 140 137 138  K 3.9 3.9 4.0 3.1* 3.7  CL 103 102 104 98* 101  CO2 33* 31  GLUCOSE 389* 355* 95 143* 121*  BUN CREATININE 0.79 0.63 0.71 0.68 0.78  CALCIUM 8.5* 8.7* 9.0 8.3* 8.5*  MG  --  1.9  --   --   --   PHOS  --  3.6  --   --    --  Liver Function Tests:  Recent Labs Lab 08/28/15 0359  AST 19  ALT 24  ALKPHOS 49  BILITOT 0.4  PROT 5.6*  ALBUMIN 3.3*   No results for input(s): LIPASE, AMYLASE in the last 168 hours. No results for input(s): AMMONIA in the last 168 hours. CBC:  Recent Labs Lab 08/27/15 2008 08/28/15 0359  WBC 6.0 7.6  NEUTROABS 5.2  --   HGB 11.6* 11.5*  HCT 34.8* 33.9*  MCV 94.3 93.4  PLT 184 177   Cardiac Enzymes:  Recent Labs Lab 08/28/15 0602 08/28/15 1219 08/28/15 1924 08/28/15 2326 08/29/15 0544  TROPONINI 0.13* 0.27* 0.22* 0.21* 0.17*   BNP: Invalid input(s): POCBNP CBG:  Recent Labs Lab 08/30/15 1209 08/30/15 1648 08/30/15 2142 08/31/15 0732 08/31/15 1231  GLUCAP 145* 111* 224* 112* 277*   D-Dimer No results for input(s): DDIMER in the last 72 hours. Hgb A1c No results for input(s): HGBA1C in the last 72 hours. Lipid Profile No results for input(s): CHOL, HDL, LDLCALC, TRIG, CHOLHDL, LDLDIRECT in the last 72 hours. Thyroid function studies No results for input(s): TSH, T4TOTAL, T3FREE, THYROIDAB in the last 72 hours.  Invalid input(s): FREET3 Anemia work up No results for input(s): VITAMINB12, FOLATE, FERRITIN, TIBC, IRON, RETICCTPCT in the last 72 hours. Urinalysis    Component Value Date/Time   COLORURINE AMBER* 12/14/2013 0133   APPEARANCEUR TURBID* 12/14/2013 0133   LABSPEC 1.023 12/14/2013 0133   PHURINE 6.0 12/14/2013 0133   GLUCOSEU NEGATIVE 12/14/2013 0133   HGBUR NEGATIVE 12/14/2013 0133   BILIRUBINUR MODERATE* 12/14/2013 0133   KETONESUR 15* 12/14/2013 0133   PROTEINUR 30* 12/14/2013 0133   UROBILINOGEN 1.0 12/14/2013 0133   NITRITE NEGATIVE 12/14/2013 0133   LEUKOCYTESUR SMALL* 12/14/2013 0133   Sepsis Labs Invalid input(s): PROCALCITONIN,  WBC,  LACTICIDVEN Microbiology No results found for this or any previous visit (from the past 240 hour(s)).  SIGNED:   Jerald Kief, MD  [facility] 08/31/2015, 1:36  PM  If 7PM-7AM, please contact night-coverage [email or web address] Password [email or web address]

## 2015-08-31 NOTE — Care Management Important Message (Signed)
Important Message  Patient Details  Name: CANDA PODGORSKI MRN: [id number] Date of Birth: [birth year]   Medicare Important Message Given:  Yes    Haskell Flirt 08/31/2015, 11:44 AMImportant Message  Patient Details  Name: TENISHIA EKMAN MRN: [id number] Date of Birth: 07/22/1930   Medicare Important Message Given:  Yes    Haskell Flirt 08/31/2015, 11:44 AM

## 2015-08-31 NOTE — Progress Notes (Addendum)
Went over all discharge information with patient and granddaughter.  All questions answered.  Portable o2 tank delivered to room.   VSS.  Prescriptions and discharge summary given to granddaughter.  Pt wheeled out by NT.

## 2015-09-10 DIAGNOSIS — Z79899 Other long term (current) drug therapy: Secondary | ICD-10-CM | POA: Diagnosis not present

## 2015-09-10 DIAGNOSIS — E1159 Type 2 diabetes mellitus with other circulatory complications: Secondary | ICD-10-CM | POA: Diagnosis not present

## 2015-09-14 DIAGNOSIS — R269 Unspecified abnormalities of gait and mobility: Secondary | ICD-10-CM | POA: Diagnosis not present

## 2015-09-29 DIAGNOSIS — J441 Chronic obstructive pulmonary disease with (acute) exacerbation: Secondary | ICD-10-CM | POA: Diagnosis not present

## 2015-09-29 DIAGNOSIS — I5032 Chronic diastolic (congestive) heart failure: Secondary | ICD-10-CM | POA: Diagnosis not present

## 2015-09-29 DIAGNOSIS — R0789 Other chest pain: Secondary | ICD-10-CM | POA: Diagnosis not present

## 2015-09-29 DIAGNOSIS — E46 Unspecified protein-calorie malnutrition: Secondary | ICD-10-CM | POA: Diagnosis not present

## 2015-09-29 DIAGNOSIS — E1159 Type 2 diabetes mellitus with other circulatory complications: Secondary | ICD-10-CM | POA: Diagnosis not present

## 2015-09-29 DIAGNOSIS — Z79899 Other long term (current) drug therapy: Secondary | ICD-10-CM | POA: Diagnosis not present

## 2015-09-29 DIAGNOSIS — M069 Rheumatoid arthritis, unspecified: Secondary | ICD-10-CM | POA: Diagnosis not present

## 2015-10-11 ENCOUNTER — Other Ambulatory Visit: Payer: Self-pay | Admitting: Internal Medicine

## 2015-10-12 ENCOUNTER — Ambulatory Visit: Payer: Medicare Other | Admitting: Internal Medicine

## 2015-10-15 DIAGNOSIS — E1159 Type 2 diabetes mellitus with other circulatory complications: Secondary | ICD-10-CM | POA: Diagnosis not present

## 2015-10-15 DIAGNOSIS — I10 Essential (primary) hypertension: Secondary | ICD-10-CM | POA: Diagnosis not present

## 2015-10-15 DIAGNOSIS — Z23 Encounter for immunization: Secondary | ICD-10-CM | POA: Diagnosis not present

## 2015-10-15 DIAGNOSIS — M069 Rheumatoid arthritis, unspecified: Secondary | ICD-10-CM | POA: Diagnosis not present

## 2015-10-15 DIAGNOSIS — E038 Other specified hypothyroidism: Secondary | ICD-10-CM | POA: Diagnosis not present

## 2015-10-15 DIAGNOSIS — D6489 Other specified anemias: Secondary | ICD-10-CM | POA: Diagnosis not present

## 2015-10-15 DIAGNOSIS — I5032 Chronic diastolic (congestive) heart failure: Secondary | ICD-10-CM | POA: Diagnosis not present

## 2015-10-15 DIAGNOSIS — R0789 Other chest pain: Secondary | ICD-10-CM | POA: Diagnosis not present

## 2015-11-29 DIAGNOSIS — R269 Unspecified abnormalities of gait and mobility: Secondary | ICD-10-CM | POA: Diagnosis not present

## 2015-12-06 DIAGNOSIS — R069 Unspecified abnormalities of breathing: Secondary | ICD-10-CM | POA: Diagnosis not present

## 2016-02-10 DIAGNOSIS — E119 Type 2 diabetes mellitus without complications: Secondary | ICD-10-CM | POA: Diagnosis not present

## 2016-02-10 DIAGNOSIS — Z961 Presence of intraocular lens: Secondary | ICD-10-CM | POA: Diagnosis not present

## 2016-02-10 DIAGNOSIS — M059 Rheumatoid arthritis with rheumatoid factor, unspecified: Secondary | ICD-10-CM | POA: Diagnosis not present

## 2016-02-10 DIAGNOSIS — Z049 Encounter for examination and observation for unspecified reason: Secondary | ICD-10-CM | POA: Diagnosis not present

## 2016-02-10 DIAGNOSIS — Z79899 Other long term (current) drug therapy: Secondary | ICD-10-CM | POA: Diagnosis not present

## 2016-02-10 DIAGNOSIS — H26491 Other secondary cataract, right eye: Secondary | ICD-10-CM | POA: Diagnosis not present

## 2016-02-10 DIAGNOSIS — H04123 Dry eye syndrome of bilateral lacrimal glands: Secondary | ICD-10-CM | POA: Diagnosis not present

## 2016-02-10 DIAGNOSIS — Z09 Encounter for follow-up examination after completed treatment for conditions other than malignant neoplasm: Secondary | ICD-10-CM | POA: Diagnosis not present

## 2016-02-20 ENCOUNTER — Encounter (HOSPITAL_COMMUNITY): Payer: Self-pay

## 2016-02-20 ENCOUNTER — Emergency Department (HOSPITAL_COMMUNITY): Payer: Medicare Other

## 2016-02-20 ENCOUNTER — Emergency Department (HOSPITAL_COMMUNITY)
Admission: EM | Admit: 2016-02-20 | Discharge: 2016-02-20 | Disposition: A | Discharge status: Home / Self Care | Payer: Medicare Other | Attending: Emergency Medicine | Admitting: Emergency Medicine

## 2016-02-20 DIAGNOSIS — T148XXA Other injury of unspecified body region, initial encounter: Secondary | ICD-10-CM | POA: Diagnosis not present

## 2016-02-20 DIAGNOSIS — I5033 Acute on chronic diastolic (congestive) heart failure: Secondary | ICD-10-CM | POA: Insufficient documentation

## 2016-02-20 DIAGNOSIS — Y929 Unspecified place or not applicable: Secondary | ICD-10-CM | POA: Diagnosis not present

## 2016-02-20 DIAGNOSIS — Z96642 Presence of left artificial hip joint: Secondary | ICD-10-CM | POA: Diagnosis not present

## 2016-02-20 DIAGNOSIS — J449 Chronic obstructive pulmonary disease, unspecified: Secondary | ICD-10-CM | POA: Diagnosis not present

## 2016-02-20 DIAGNOSIS — Z7984 Long term (current) use of oral hypoglycemic drugs: Secondary | ICD-10-CM | POA: Diagnosis not present

## 2016-02-20 DIAGNOSIS — M5489 Other dorsalgia: Secondary | ICD-10-CM | POA: Diagnosis not present

## 2016-02-20 DIAGNOSIS — M533 Sacrococcygeal disorders, not elsewhere classified: Secondary | ICD-10-CM | POA: Diagnosis not present

## 2016-02-20 DIAGNOSIS — Z96611 Presence of right artificial shoulder joint: Secondary | ICD-10-CM | POA: Insufficient documentation

## 2016-02-20 DIAGNOSIS — S3992XA Unspecified injury of lower back, initial encounter: Secondary | ICD-10-CM | POA: Diagnosis not present

## 2016-02-20 DIAGNOSIS — I110 Hypertensive heart disease with heart failure: Secondary | ICD-10-CM | POA: Insufficient documentation

## 2016-02-20 DIAGNOSIS — M25559 Pain in unspecified hip: Secondary | ICD-10-CM | POA: Diagnosis not present

## 2016-02-20 DIAGNOSIS — M545 Low back pain: Secondary | ICD-10-CM | POA: Diagnosis not present

## 2016-02-20 DIAGNOSIS — Z7982 Long term (current) use of aspirin: Secondary | ICD-10-CM | POA: Insufficient documentation

## 2016-02-20 DIAGNOSIS — Z79899 Other long term (current) drug therapy: Secondary | ICD-10-CM | POA: Diagnosis not present

## 2016-02-20 DIAGNOSIS — I2510 Atherosclerotic heart disease of native coronary artery without angina pectoris: Secondary | ICD-10-CM | POA: Diagnosis not present

## 2016-02-20 DIAGNOSIS — Z955 Presence of coronary angioplasty implant and graft: Secondary | ICD-10-CM | POA: Diagnosis not present

## 2016-02-20 DIAGNOSIS — Y999 Unspecified external cause status: Secondary | ICD-10-CM | POA: Diagnosis not present

## 2016-02-20 DIAGNOSIS — Z8673 Personal history of transient ischemic attack (TIA), and cerebral infarction without residual deficits: Secondary | ICD-10-CM | POA: Insufficient documentation

## 2016-02-20 DIAGNOSIS — W19XXXA Unspecified fall, initial encounter: Secondary | ICD-10-CM

## 2016-02-20 DIAGNOSIS — E1140 Type 2 diabetes mellitus with diabetic neuropathy, unspecified: Secondary | ICD-10-CM | POA: Insufficient documentation

## 2016-02-20 DIAGNOSIS — Y939 Activity, unspecified: Secondary | ICD-10-CM | POA: Diagnosis not present

## 2016-02-20 DIAGNOSIS — W010XXA Fall on same level from slipping, tripping and stumbling without subsequent striking against object, initial encounter: Secondary | ICD-10-CM | POA: Diagnosis not present

## 2016-02-20 DIAGNOSIS — S299XXA Unspecified injury of thorax, initial encounter: Secondary | ICD-10-CM | POA: Diagnosis not present

## 2016-02-20 DIAGNOSIS — Z87891 Personal history of nicotine dependence: Secondary | ICD-10-CM | POA: Insufficient documentation

## 2016-02-20 DIAGNOSIS — Z7901 Long term (current) use of anticoagulants: Secondary | ICD-10-CM | POA: Insufficient documentation

## 2016-02-20 LAB — CBC WITH DIFFERENTIAL/PLATELET
BASOS ABS: 0 10*3/uL (ref 0.0–0.1)
Basophils Relative: 0 %
EOS ABS: 0.2 10*3/uL (ref 0.0–0.7)
EOS PCT: 2 %
HCT: 35.6 % — ABNORMAL LOW (ref 36.0–46.0)
Hemoglobin: 11.6 g/dL — ABNORMAL LOW (ref 12.0–15.0)
Lymphocytes Relative: 8 %
Lymphs Abs: 0.8 10*3/uL (ref 0.7–4.0)
MCH: 28.6 pg (ref 26.0–34.0)
MCHC: 32.6 g/dL (ref 30.0–36.0)
MCV: 87.7 fL (ref 78.0–100.0)
MONO ABS: 1.1 10*3/uL — AB (ref 0.1–1.0)
Monocytes Relative: 11 %
Neutro Abs: 8.2 10*3/uL — ABNORMAL HIGH (ref 1.7–7.7)
Neutrophils Relative %: 79 %
PLATELETS: 188 10*3/uL (ref 150–400)
RBC: 4.06 MIL/uL (ref 3.87–5.11)
RDW: 14.6 % (ref 11.5–15.5)
WBC: 10.2 10*3/uL (ref 4.0–10.5)

## 2016-02-20 LAB — URINALYSIS, ROUTINE W REFLEX MICROSCOPIC
Bilirubin Urine: NEGATIVE
Glucose, UA: NEGATIVE mg/dL
HGB URINE DIPSTICK: NEGATIVE
Ketones, ur: 80 mg/dL — AB
LEUKOCYTES UA: NEGATIVE
Nitrite: NEGATIVE
PROTEIN: NEGATIVE mg/dL
Specific Gravity, Urine: 1.015 (ref 1.005–1.030)
pH: 6 (ref 5.0–8.0)

## 2016-02-20 LAB — BASIC METABOLIC PANEL
ANION GAP: 13 (ref 5–15)
BUN: 10 mg/dL (ref 6–20)
CALCIUM: 8.5 mg/dL — AB (ref 8.9–10.3)
CO2: 22 mmol/L (ref 22–32)
Chloride: 100 mmol/L — ABNORMAL LOW (ref 101–111)
Creatinine, Ser: 0.65 mg/dL (ref 0.44–1.00)
GFR calc Af Amer: >60 mL/min (ref >60)
GLUCOSE: 72 mg/dL (ref 65–99)
POTASSIUM: 3.7 mmol/L (ref 3.5–5.1)
SODIUM: 135 mmol/L (ref 135–145)

## 2016-02-20 LAB — I-STAT TROPONIN, ED: TROPONIN I, POC: 0.05 ng/mL (ref 0.00–0.08)

## 2016-02-20 MED ORDER — HYDROMORPHONE HCL 1 MG/ML IJ SOLN
1.0000 mg | Freq: Once | INTRAMUSCULAR | Status: AC
  Administered 2016-02-20: 1 mg via INTRAVENOUS
  Filled 2016-02-20: qty 1

## 2016-02-20 MED ORDER — TRAMADOL HCL 50 MG PO TABS
50.0000 mg | ORAL_TABLET | Freq: Once | ORAL | Status: AC
  Administered 2016-02-20: 50 mg via ORAL
  Filled 2016-02-20: qty 1

## 2016-02-20 MED ORDER — ALBUTEROL SULFATE (2.5 MG/3ML) 0.083% IN NEBU
5.0000 mg | INHALATION_SOLUTION | Freq: Once | RESPIRATORY_TRACT | Status: AC
  Administered 2016-02-20: 5 mg via RESPIRATORY_TRACT
  Filled 2016-02-20: qty 6

## 2016-02-20 MED ORDER — HYDROMORPHONE HCL 2 MG PO TABS: 1.0000 mg | ORAL_TABLET | ORAL | 0 refills | Status: AC | PRN

## 2016-02-20 NOTE — Discharge Instructions (Signed)
Continue your home nebulizer treatments. Take dilaudid for pain.  You may need a stool softener when taking this as it can cause some constipation. Follow-up with your primary care doctor.

## 2016-02-20 NOTE — ED Notes (Signed)
No respiratory or acute distress noted alert and talking but confused family at bedside call light in reach no reaction to medication noted.

## 2016-02-20 NOTE — ED Triage Notes (Signed)
She states she fell on her bottom, having "tripped" whilst coming out of her bathroom about 1 1/2 weeks ago. She is here today with c/o coccx/sacral areadiscomfort x 2 days. She arrives in no distress.

## 2016-02-20 NOTE — ED Notes (Signed)
Bed: [facility]84 Expected date:  Expected time:  Means of arrival:  Comments: 82 yo fall x3 days ago; sacral pain

## 2016-02-20 NOTE — ED Notes (Signed)
Patient transported to X-ray 

## 2016-02-20 NOTE — ED Notes (Signed)
Called [facility] for transport to home per family decision.

## 2016-02-20 NOTE — ED Notes (Signed)
No respiratory or acute distress noted alert and talking family at bedside no reaction to medication noted call light in reach.

## 2016-02-20 NOTE — ED Provider Notes (Signed)
[facility] DEPT Provider Note   CSN: [id number] Arrival date & time: 02/20/16  1541     History   Chief Complaint Chief Complaint  Patient presents with  . Tailbone Pain    HPI [name] is a 82 y.o. female.  The history is provided by the patient and medical records.   82 year old female with history of Barrett's esophagus, CAD, COPD, hypertension not currently on medications, history of stroke, history of spinal stenosis, presenting to the ED after a fall that occurred 1-1/2 weeks ago. Per patient's daughter, patient has a baseline length discrepancy and her legs secondary to prior left hip fracture and repair as well as right pelvis fractures.  States she enjoys with a walker at baseline, however continues to have falls regularly because of this. She also has a history of spinal stenosis and has had 2 back surgeries for this as well. Patient reports about a week and half ago she was walking to the bathroom when she lost her footing and fell onto her buttocks. No head injury loss of consciousness. States she was able to get out of the floor and continue walking around without issue. Daughter reports she just began complaining of pain over the past 2 days. States pain is mostly localized to her tailbone in her low back, but she is having some pain in her hips as well. Daughter does report patient is up and about moving throughout the day as she urinates frequently, every half hour to hour which has been her baseline for a while now. She does report chronic COPD and is on home prednisone therapy daily for this. States she has had some wheezing today. Patient denies any chest pain. Patient does use home oxygen PRN, mostly at night.  Patient's BP is elevated here--- she has hx of HTN, not currently on medications as per daughter, her falls increased while taking them. (?hypotension)  States her pressure is generally elevated when she is in pain or near a doctor.  Past Medical History:    Diagnosis Date  . Aortic atherosclerosis ([place])    Noted on CT scan. Also with aortobifem and AAA repair  . Barrett esophagus   . Bilateral edema of lower extremity   . CAD S/P percutaneous coronary angioplasty 2004   PCI-RCA (2.24m x 11mTaxus DES)& Cx (2.75 mm x 14 mm Cypher DES)  . Chronic pneumonia ([facility]   Several "Bouts"  . Closed hip fracture (HCC)    Right  . [facility]   Former Smoker; Centrilobular emphysema with bronchiectasis  . DJD (degenerative joint disease)   . DVT, femoral, chronic (HCC)    Bilateral -- was on chronic Warfarin until fall with subdural hematoma in 2015  . Essential hypertension    Labile  . Fall from slip, trip, or stumble 08/20/2013  . Gastroesophageal reflux disease with hiatal hernia   . H/O migraine    "used to have them; not anymore" (08/22/2013)  . History of  Angina    Treated with PCI in 2004; no active Symptoms.  . History of blood transfusion   . History of ischemic stroke without residual deficits    Diagnosed by Radiographic Imaging  . Hyperlipidemia LDL goal <70    Statin intolerant  . Iron deficiency anemia   . Neuropathy ([facility]  . Osteoarthritis of both hips     status post multiple hip operations, recent fall with fracture.    . Osteoporosis   .  PAD (peripheral artery disease) ([place]) 1994   History of right iliac stenting, fem-fem bypass in 94; aortobifem bypass/ AAA repair - November 2004; latest ABIs of been roughly 1 bilaterally, followed by Dr. Donnetta Hutching  . Rheumatoid arthritis ([place])   . Shortness of breath    Chronic MILD- WITH EXERTION. chronic  . Shoulder pain, right    RIGHT SHOULDER WITH LIMITED ROM--STATES PREVIOUS RT TOTAL SHOULDDER REPLACEMENT-BUT STILL HAS PROBLEMS WITH SHOULDER    Patient Active Problem List   Diagnosis Date Noted  . Encounter for palliative care   . Goals of care, counseling/discussion   . Leg swelling 08/28/2015  . Chest pain 08/28/2015  . Acute on chronic diastolic  heart failure ([place]) 08/28/2015  . COPD exacerbation ([place]) 08/27/2015  . Elevated troponin 08/27/2015  . Chronic cough 04/16/2014  . Dyspnea on exertion 03/27/2014  . Orthopnea 03/27/2014  . Subdural hematoma ([place]) 12/12/2013  . Laceration of head 12/12/2013  . SDH (subdural hematoma) ([place]) 12/12/2013  . Rectus sheath hematoma 12/10/2013  . Thoracic compression fracture ([place]) 12/10/2013  . Protein-calorie malnutrition ([place]) 12/10/2013  . DVT, recurrent, lower extremity, chronic ([place]) 12/10/2013  . Abdominal pain 12/02/2013  . Pubic bone fracture ([place]) 08/20/2013  . Closed rib fracture 08/20/2013  . Fracture of multiple pubic rami ([place]) 08/20/2013  . Painful orthopaedic hardware right hip 12/27/2012  . Pre-operative cardiovascular examination 12/14/2012  . Dyslipidemia, goal LDL below 70 12/14/2012  . DVT, Right Femoral, "chronic" -  with extension to the peroneal 09/26/2012  . PAD (peripheral artery disease) ([place])   . Generalized anxiety disorder 07/02/2012  . Unspecified constipation 07/02/2012  . Long term (current) use of anticoagulants 05/30/2012  . Anemia 04/14/2012  . CAD S/P PCI RCA & Circ with DES 04/13/2012  . DM (diabetes mellitus), type 2, uncontrolled with complications ([place]) 73/66/8159  . COPD (chronic obstructive pulmonary disease) ([place]) 04/11/2012  . Essential hypertension 04/11/2012  . AAA (abdominal aortic aneurysm) ([place]) 04/11/2012  . Closed right hip fracture ([place]) 04/11/2012  . Rheumatoid arthritis ([place]) 04/11/2012  . Degenerative arthritis of hip 01/26/2012  . GERD (gastroesophageal reflux disease) 03/23/2011  . BARRETTS ESOPHAGUS 07/26/2009  . HOARSENESS, CHRONIC 07/26/2009  . Aortic atherosclerosis ([place]) 12/15/2002    Past Surgical History:  Procedure Laterality Date  . ABDOMINAL AORTIC ANEURYSM REPAIR  2004   AortoBi-Fem Bypass  . APPENDECTOMY  1950  . BACK SURGERY     x 2  . CARDIAC CATHETERIZATION  October 2005   Patent RCA stent, 20% ISR of Cx  stent; EF 50-55%  . CARPAL TUNNEL RELEASE Left 04/2011  . CATARACT EXTRACTION W/ INTRAOCULAR LENS  IMPLANT, BILATERAL Bilateral 2001-2002  . CHOLECYSTECTOMY     several yrs ago  . COLONOSCOPY  05/09/2004; 06/08/1999   internal hemorrhoids; diverticulosis  . CORONARY ANGIOPLASTY WITH STENT PLACEMENT  2004   RCA: Taxus DES 2.5 mm x 12 mm; Circumflex: Cypher DES 2.75 mm 14 mm  . DOPPLER ECHOCARDIOGRAPHY  06/06/2012   EF 50-60%.  Mild LVH, grade one diastolic function; normal PA pressures and CVP  . DOPPLER ECHOCARDIOGRAPHY  06/06/2012   Mild LVH, EF 55-60%.  Normal wall motion.  Mildly elevated LVEDP with grade one diastolic function.  Mild LA dilatation.  Normal PA pressures.  [name] EXCISION/RELEASE BURSA HIP Right 12/27/2012   Procedure: RIGHT HIP BURSECTOMY AND COMPRESSION SCREW REMOVAL;  Surgeon: Mcarthur Rossetti, MD;  Location: [facility];  Service: Orthopedics;  Laterality: Right;  . FEMORAL-FEMORAL BYPASS GRAFT  1994  Following right iliac stenting  . INTRAMEDULLARY (IM) NAIL INTERTROCHANTERIC Right 04/12/2012   Procedure: INTRAMEDULLARY (IM) NAIL INTERTROCHANTRIC Right;  Surgeon: Mcarthur Rossetti, MD;  Location: [facility];  Service: Orthopedics;  Laterality: Right;  . JOINT REPLACEMENT    . lexiscan cardiolite nuclear stress test    . Lower Extremity Venous Dopplers  08/16/2012   Partially occlusive right femoral vein thrombosis, also noted in right peroneal vein; no thrombosis in left sided veins.  Doreene Burke SPINE SURGERY  October 2012   spinal stenosis, Dr. Lorin Mercy  . NM MYOVIEW LTD  April 2014   Bowel artifact, No ischemia or infarction  . SHOULDER OPEN ROTATOR CUFF REPAIR Bilateral    2 times on left  . TOTAL ABDOMINAL HYSTERECTOMY  1965  . TOTAL HIP ARTHROPLASTY  01/26/2012   Procedure: TOTAL HIP ARTHROPLASTY ANTERIOR APPROACH;  Surgeon: Mcarthur Rossetti, MD;  Location: [facility];  Service: Orthopedics;  Laterality: Left;  Left Total Hip Arthroplasty, Anterior Approach (C-Arm)  .  TOTAL SHOULDER REPLACEMENT Right 2011  . TUBAL LIGATION  1956  . UPPER GASTROINTESTINAL ENDOSCOPY  multiple   w/bx, hiatal hernia, Barretts', esophageal polyp 1x    OB History    No data available       Home Medications    Prior to Admission medications   Medication Sig Start Date End Date Taking? Authorizing Provider  albuterol (PROVENTIL HFA;VENTOLIN HFA) 108 (90 BASE) MCG/ACT inhaler Inhale 2 puffs into the lungs every 6 (six) hours as needed for wheezing or shortness of breath.    Historical Provider, MD  aspirin EC 81 MG tablet Take 81 mg by mouth every other day.    Historical Provider, MD  bisacodyl (DULCOLAX) 5 MG EC tablet Take 1 tablet (5 mg total) by mouth daily as needed for moderate constipation. 08/31/15   Donne Hazel, MD  blood glucose meter kit and supplies KIT Dispense based on patient and insurance preference. Use up to four times daily as directed. (FOR ICD-9 250.00, 250.01). 08/31/15   Donne Hazel, MD  blood glucose meter kit and supplies KIT Dispense based on patient and insurance preference. Use up to four times daily as directed. (FOR ICD-9 250.00, 250.01). 08/31/15   Donne Hazel, MD  Camphor-Menthol-Methyl Sal (SALONPAS) 1.2-5.7-6.3 % [facility] 1 patch onto the skin daily as needed (For pain.).    Historical Provider, MD  docusate sodium (COLACE) 100 MG capsule Take 100 mg by mouth daily.     Historical Provider, MD  doxycycline (VIBRA-TABS) 100 MG tablet Take 1 tablet (100 mg total) by mouth every 12 (twelve) hours. 08/31/15   Donne Hazel, MD  escitalopram (LEXAPRO) 5 MG tablet Take 5 mg by mouth every evening.     Historical Provider, MD  fluticasone (FLONASE) 50 MCG/ACT nasal spray Place 2 sprays into both nostrils daily. Patient not taking: Reported on 05/08/2014 04/16/14   Brand Males, MD  Fluticasone Furoate-Vilanterol (BREO ELLIPTA) 100-25 MCG/INH AEPB Inhale 1 puff into the lungs daily. Patient not taking: Reported on 08/27/2015 04/16/14   Brand Males, MD  guaiFENesin (MUCINEX) 600 MG 12 hr tablet Take 600 mg by mouth daily at 12 noon.    Historical Provider, MD  hydroxychloroquine (PLAQUENIL) 200 MG tablet Take 300 mg by mouth daily.     Historical Provider, MD  hydroxypropyl methylcellulose / hypromellose (ISOPTO TEARS / GONIOVISC) 2.5 % ophthalmic solution Place 1-2 drops into both eyes 3 (three) times daily as needed for dry eyes.  Historical Provider, MD  ipratropium-albuterol (DUONEB) 0.5-2.5 (3) MG/3ML SOLN Take 3 mLs by nebulization 3 (three) times daily.    Historical Provider, MD  isosorbide mononitrate (IMDUR) 30 MG 24 hr tablet Take 1 tablet (30 mg total) by mouth daily. 04/15/14   Leonie Man, MD  metFORMIN (GLUCOPHAGE) 500 MG tablet Take 1 tablet (500 mg total) by mouth 2 (two) times daily with a meal. 08/31/15   Donne Hazel, MD  Multiple Vitamin (MULTIVITAMIN WITH MINERALS) TABS tablet Take 1 tablet by mouth daily at 12 noon.    Historical Provider, MD  omeprazole (PRILOSEC OTC) 20 MG tablet Take 20 mg by mouth every morning.     Historical Provider, MD  polyethylene glycol (MIRALAX / GLYCOLAX) packet Take 17 g by mouth daily. Patient taking differently: Take 17 g by mouth daily as needed.  12/06/13   Nita Sells, MD  traMADol (ULTRAM) 50 MG tablet Take 1 tablet (50 mg total) by mouth every 6 (six) hours as needed for moderate pain. 12/15/13   Delfina Redwood, MD    Family History Family History  Problem Relation Age of Onset  . Prostate cancer Father   . Diabetes Mother   . Breast cancer Mother   . Heart disease Mother   . Colon cancer Neg Hx     Social History Social History  Substance Use Topics  . Smoking status: Former Smoker    Packs/day: 1.00    Years: 50.00    Types: Cigarettes    Quit date: 04/20/2000  . Smokeless tobacco: Never Used  . Alcohol use No     Allergies   Codeine; Hydrocodone; Morphine and related; Other; Penicillins; Demerol [meperidine]; Propoxyphene; Statins;  Sulfasalazine; and Tape   Review of Systems Review of Systems  Respiratory: Positive for wheezing.   Musculoskeletal: Positive for arthralgias and back pain.  All other systems reviewed and are negative.    Physical Exam Updated Vital Signs BP (!) 203/82 (BP Location: Left Arm)   Pulse 90   Temp 98.2 F (36.8 C) (Oral)   Resp 18   SpO2 98%   Physical Exam  Constitutional: She is oriented to person, place, and time. She appears well-developed and well-nourished.  HENT:  Head: Normocephalic and atraumatic.  Mouth/Throat: Oropharynx is clear and moist.  No visible signs of head trauma  Eyes: Conjunctivae and EOM are normal. Pupils are equal, round, and reactive to light.  Neck: Normal range of motion.  Cardiovascular: Normal rate, regular rhythm and normal heart sounds.   Pulmonary/Chest: Effort normal. She has wheezes.  Expiratory wheezes noted throughout, no distress, able to speak in sentences without difficulty, O2 sats normal on room air  Abdominal: Soft. Bowel sounds are normal.  Musculoskeletal: Normal range of motion.  Mild tenderness of the sacrum and lumbar spine without noted step-off or deformity; pain exacerbated with movement Pelvis feels stable, no apparent tenderness with palpation during exam  Neurological: She is alert and oriented to person, place, and time.  AAOx3, moving extremities well with purposeful movements, answering questions and following commands, thought process is clear and coherent  Skin: Skin is warm and dry.  Scattered skin tears noted to left and right forearms as well as right lateral calf  Psychiatric: She has a normal mood and affect.  Nursing note and vitals reviewed.    ED Treatments / Results  Labs (all labs ordered are listed, but only abnormal results are displayed) Labs Reviewed  CBC WITH DIFFERENTIAL/PLATELET - Abnormal; Notable  for the following:       Result Value   Hemoglobin 11.6 (*)    HCT 35.6 (*)    Neutro Abs 8.2  (*)    Monocytes Absolute 1.1 (*)    All other components within normal limits  BASIC METABOLIC PANEL - Abnormal; Notable for the following:    Chloride 100 (*)    Calcium 8.5 (*)    All other components within normal limits  URINALYSIS, ROUTINE W REFLEX MICROSCOPIC - Abnormal; Notable for the following:    Ketones, ur 80 (*)    All other components within normal limits  I-STAT TROPOININ, ED    EKG  EKG Interpretation  Date/Time:  Sunday February 20 2016 17:29:43 EST Ventricular Rate:  91 PR Interval:    QRS Duration: 111 QT Interval:  387 QTC Calculation: 477 R Axis:   -48 Text Interpretation:  Sinus rhythm Atrial premature complexes LVH with IVCD, LAD and secondary repol abnrm No significant change since last tracing Confirmed by YAO  MD, DAVID ([id number]) on 02/20/2016 7:46:13 PM       Radiology Dg Chest 2 View  Result Date: 02/20/2016 CLINICAL DATA:  Severe tail bone pain secondary to a fall wheezing and shortness of breath EXAM: CHEST  2 VIEW COMPARISON:  CT 08/27/2015, radiograph 08/27/2015 FINDINGS: The lungs are hyperinflated. There is diffuse coarsening of the lung interstitium, felt secondary to chronic changes. There is no focal consolidation or pleural effusion. Cannot exclude superimposed interstitial inflammation in the left lower lobe and right upper lobe. Stable borderline cardiomegaly with dense atherosclerosis. No pneumothorax. Surgical clips in the right upper quadrant. Partially visualized right shoulder replacement. Multiple vertebral augmentation of the thoracic spine. Multiple old rib fractures. Moderate hiatal hernia. IMPRESSION: Hyperinflation. Slight increased interstitial opacities in the right upper and left lower lobes, cannot exclude interstitial inflammation superimposed on chronic changes. No focal consolidation is seen. Electronically Signed   By: Donavan Foil M.D.   On: 02/20/2016 18:42   Dg Lumbar Spine Complete  Result Date: 02/20/2016 CLINICAL  DATA:  Pain after fall EXAM: LUMBAR SPINE - COMPLETE 4+ VIEW COMPARISON:  08/30/2015, 12/02/2013 FINDINGS: Hiatal hernia is noted. Partially visualized vertebral augmentation. Surgical clips in the right upper quadrant. Vascular stent in the right iliac region. Dense vascular calcification. Partially visualized left hip replacement and right femoral hardware. Five non rib-bearing lumbar type vertebra. Bones appear osteopenic. Lumbar alignment within normal limits. Mild age-indeterminate superior endplate deformities at L1-L2 and L3. IMPRESSION: 1. Bones appear osteopenic. Age indeterminate mild superior endplate deformities L1 through L3. Electronically Signed   By: Donavan Foil M.D.   On: 02/20/2016 18:50   Dg Pelvis 1-2 Views  Result Date: 02/20/2016 CLINICAL DATA:  Fall, tail bone pain EXAM: PELVIS - 1-2 VIEW COMPARISON:  Abdominal films 08/30/2015, pelvic -CT 08/30/2013 FINDINGS: LEFT hip prosthetic and RIGHT femur internal fixation. No evidence of acute fracture dislocation of LEFT RIGHT hip. No pelvic fracture sacral fracture. Node diastases. IMPRESSION: No evidence of pelvic fracture or hip fracture. Electronically Signed   By: Suzy Bouchard M.D.   On: 02/20/2016 18:46   Dg Sacrum/coccyx  Result Date: 02/20/2016 CLINICAL DATA:  Severe tail bone pain after fall 2 weeks ago. EXAM: SACRUM AND COCCYX - 2+ VIEW COMPARISON:  12/27/2012 FINDINGS: No displaced fracture of the sacrum identified. No coccygeal fracture identified. Osteopenia noted. Hip prosthetics. Atherosclerotic calcification vascular stent noted. IMPRESSION: No displaced fracture of the sacrum or coccyx identified. Electronically Signed   By: Nicole Kindred  Leonia Reeves M.D.   On: 02/20/2016 18:43    Procedures Procedures (including critical care time)  Medications Ordered in ED Medications - No data to display   Initial Impression / Assessment and Plan / ED Course  I have reviewed the triage vital signs and the nursing  notes.  Pertinent labs & imaging results that were available during my care of the patient were reviewed by me and considered in my medical decision making (see chart for details).  Clinical Course    82 year old female here with coccyx pain after a fall week and a half ago. She has history of frequent falls secondary to length discrepancy of her legs from prior hip injuries/surgeries.  On exam he does have some mild tenderness of her coccyx as well as her lumbar spine without noted step-off or deformity. Her pelvis is stable. She is wheezing on exam. Daughter reports this is somewhat chronic for her as she has severe COPD, on PRN oxygen and daily steroids for this.  Her BP is also elevated-- she has history of hypertension is not currently on medications. Daughter reports it is generally high if she is in pain or is around the doctor. We'll plan for basic labs, imaging studies. Patient was given dose of tramadol for pain which she takes at home.  Labwork is overall reassuring.  Imaging studies without acute findings. Chest x-ray with findings suggestive of acute on chronic interstitial inflammation. After nebulizer treatment, lung sounds have mostly cleared. Patient has remained ambulatory since the fall. Her blood pressure has improved here. At this time, there does not appear to be an emergent condition requiring hospitalization.  Patient and daughter requesting better pain control at home. Patient has multitude of drug allergies including codeine. She's not had any significant relief with the tramadol. Based on chart review with her prior surgeries she has taken Dilaudid without issue.  Will prescribe a short course of this for home. Will have patient follow-up with her primary care doctor.  Discussed plan with patient and daughter, they acknowledged understanding and agreed with plan of care.  Return precautions given for new or worsening symptoms.  Case discussed with attending physician, Dr. Darl Householder,  who evaluated patient and agrees with work up, assessment, and plan of care.  Final Clinical Impressions(s) / ED Diagnoses   Final diagnoses:  Fall, initial encounter  Coccyx pain    New Prescriptions New Prescriptions   HYDROMORPHONE (DILAUDID) 2 MG TABLET    Take 0.5 tablets (1 mg total) by mouth every 4 (four) hours as needed.     Larene Pickett, PA-C 02/20/16 2043    Drenda Freeze, MD 02/21/16 [phone]

## 2016-02-22 DIAGNOSIS — R269 Unspecified abnormalities of gait and mobility: Secondary | ICD-10-CM | POA: Diagnosis not present

## 2016-02-28 DIAGNOSIS — R269 Unspecified abnormalities of gait and mobility: Secondary | ICD-10-CM | POA: Diagnosis not present

## 2016-02-29 DIAGNOSIS — E46 Unspecified protein-calorie malnutrition: Secondary | ICD-10-CM | POA: Diagnosis not present

## 2016-02-29 DIAGNOSIS — E1159 Type 2 diabetes mellitus with other circulatory complications: Secondary | ICD-10-CM | POA: Diagnosis not present

## 2016-02-29 DIAGNOSIS — I2510 Atherosclerotic heart disease of native coronary artery without angina pectoris: Secondary | ICD-10-CM | POA: Diagnosis not present

## 2016-02-29 DIAGNOSIS — K219 Gastro-esophageal reflux disease without esophagitis: Secondary | ICD-10-CM | POA: Diagnosis not present

## 2016-02-29 DIAGNOSIS — J449 Chronic obstructive pulmonary disease, unspecified: Secondary | ICD-10-CM | POA: Diagnosis not present

## 2016-02-29 DIAGNOSIS — F339 Major depressive disorder, recurrent, unspecified: Secondary | ICD-10-CM | POA: Diagnosis not present

## 2016-02-29 DIAGNOSIS — M069 Rheumatoid arthritis, unspecified: Secondary | ICD-10-CM | POA: Diagnosis not present

## 2016-02-29 DIAGNOSIS — S322XXS Fracture of coccyx, sequela: Secondary | ICD-10-CM | POA: Diagnosis not present

## 2016-03-01 DIAGNOSIS — I2510 Atherosclerotic heart disease of native coronary artery without angina pectoris: Secondary | ICD-10-CM | POA: Diagnosis not present

## 2016-03-01 DIAGNOSIS — E46 Unspecified protein-calorie malnutrition: Secondary | ICD-10-CM | POA: Diagnosis not present

## 2016-03-01 DIAGNOSIS — J449 Chronic obstructive pulmonary disease, unspecified: Secondary | ICD-10-CM | POA: Diagnosis not present

## 2016-03-01 DIAGNOSIS — E1159 Type 2 diabetes mellitus with other circulatory complications: Secondary | ICD-10-CM | POA: Diagnosis not present

## 2016-03-01 DIAGNOSIS — M069 Rheumatoid arthritis, unspecified: Secondary | ICD-10-CM | POA: Diagnosis not present

## 2016-03-01 DIAGNOSIS — F339 Major depressive disorder, recurrent, unspecified: Secondary | ICD-10-CM | POA: Diagnosis not present

## 2016-03-02 DIAGNOSIS — I2510 Atherosclerotic heart disease of native coronary artery without angina pectoris: Secondary | ICD-10-CM | POA: Diagnosis not present

## 2016-03-02 DIAGNOSIS — E46 Unspecified protein-calorie malnutrition: Secondary | ICD-10-CM | POA: Diagnosis not present

## 2016-03-02 DIAGNOSIS — F339 Major depressive disorder, recurrent, unspecified: Secondary | ICD-10-CM | POA: Diagnosis not present

## 2016-03-02 DIAGNOSIS — J449 Chronic obstructive pulmonary disease, unspecified: Secondary | ICD-10-CM | POA: Diagnosis not present

## 2016-03-02 DIAGNOSIS — E1159 Type 2 diabetes mellitus with other circulatory complications: Secondary | ICD-10-CM | POA: Diagnosis not present

## 2016-03-02 DIAGNOSIS — M069 Rheumatoid arthritis, unspecified: Secondary | ICD-10-CM | POA: Diagnosis not present

## 2016-03-03 DIAGNOSIS — J449 Chronic obstructive pulmonary disease, unspecified: Secondary | ICD-10-CM | POA: Diagnosis not present

## 2016-03-03 DIAGNOSIS — F339 Major depressive disorder, recurrent, unspecified: Secondary | ICD-10-CM | POA: Diagnosis not present

## 2016-03-03 DIAGNOSIS — I2510 Atherosclerotic heart disease of native coronary artery without angina pectoris: Secondary | ICD-10-CM | POA: Diagnosis not present

## 2016-03-03 DIAGNOSIS — E46 Unspecified protein-calorie malnutrition: Secondary | ICD-10-CM | POA: Diagnosis not present

## 2016-03-03 DIAGNOSIS — E1159 Type 2 diabetes mellitus with other circulatory complications: Secondary | ICD-10-CM | POA: Diagnosis not present

## 2016-03-03 DIAGNOSIS — M069 Rheumatoid arthritis, unspecified: Secondary | ICD-10-CM | POA: Diagnosis not present

## 2016-03-04 DIAGNOSIS — I2510 Atherosclerotic heart disease of native coronary artery without angina pectoris: Secondary | ICD-10-CM | POA: Diagnosis not present

## 2016-03-04 DIAGNOSIS — M069 Rheumatoid arthritis, unspecified: Secondary | ICD-10-CM | POA: Diagnosis not present

## 2016-03-04 DIAGNOSIS — E46 Unspecified protein-calorie malnutrition: Secondary | ICD-10-CM | POA: Diagnosis not present

## 2016-03-04 DIAGNOSIS — J449 Chronic obstructive pulmonary disease, unspecified: Secondary | ICD-10-CM | POA: Diagnosis not present

## 2016-03-04 DIAGNOSIS — F339 Major depressive disorder, recurrent, unspecified: Secondary | ICD-10-CM | POA: Diagnosis not present

## 2016-03-04 DIAGNOSIS — E1159 Type 2 diabetes mellitus with other circulatory complications: Secondary | ICD-10-CM | POA: Diagnosis not present

## 2016-03-05 DIAGNOSIS — I2510 Atherosclerotic heart disease of native coronary artery without angina pectoris: Secondary | ICD-10-CM | POA: Diagnosis not present

## 2016-03-05 DIAGNOSIS — M069 Rheumatoid arthritis, unspecified: Secondary | ICD-10-CM | POA: Diagnosis not present

## 2016-03-05 DIAGNOSIS — F339 Major depressive disorder, recurrent, unspecified: Secondary | ICD-10-CM | POA: Diagnosis not present

## 2016-03-05 DIAGNOSIS — E46 Unspecified protein-calorie malnutrition: Secondary | ICD-10-CM | POA: Diagnosis not present

## 2016-03-05 DIAGNOSIS — E1159 Type 2 diabetes mellitus with other circulatory complications: Secondary | ICD-10-CM | POA: Diagnosis not present

## 2016-03-05 DIAGNOSIS — J449 Chronic obstructive pulmonary disease, unspecified: Secondary | ICD-10-CM | POA: Diagnosis not present

## 2016-03-06 DIAGNOSIS — F339 Major depressive disorder, recurrent, unspecified: Secondary | ICD-10-CM | POA: Diagnosis not present

## 2016-03-06 DIAGNOSIS — I2510 Atherosclerotic heart disease of native coronary artery without angina pectoris: Secondary | ICD-10-CM | POA: Diagnosis not present

## 2016-03-06 DIAGNOSIS — M069 Rheumatoid arthritis, unspecified: Secondary | ICD-10-CM | POA: Diagnosis not present

## 2016-03-06 DIAGNOSIS — J449 Chronic obstructive pulmonary disease, unspecified: Secondary | ICD-10-CM | POA: Diagnosis not present

## 2016-03-06 DIAGNOSIS — E1159 Type 2 diabetes mellitus with other circulatory complications: Secondary | ICD-10-CM | POA: Diagnosis not present

## 2016-03-06 DIAGNOSIS — E46 Unspecified protein-calorie malnutrition: Secondary | ICD-10-CM | POA: Diagnosis not present

## 2016-03-07 DIAGNOSIS — M069 Rheumatoid arthritis, unspecified: Secondary | ICD-10-CM | POA: Diagnosis not present

## 2016-03-07 DIAGNOSIS — J449 Chronic obstructive pulmonary disease, unspecified: Secondary | ICD-10-CM | POA: Diagnosis not present

## 2016-03-07 DIAGNOSIS — E1159 Type 2 diabetes mellitus with other circulatory complications: Secondary | ICD-10-CM | POA: Diagnosis not present

## 2016-03-07 DIAGNOSIS — E46 Unspecified protein-calorie malnutrition: Secondary | ICD-10-CM | POA: Diagnosis not present

## 2016-03-07 DIAGNOSIS — F339 Major depressive disorder, recurrent, unspecified: Secondary | ICD-10-CM | POA: Diagnosis not present

## 2016-03-07 DIAGNOSIS — I2510 Atherosclerotic heart disease of native coronary artery without angina pectoris: Secondary | ICD-10-CM | POA: Diagnosis not present

## 2016-03-08 DIAGNOSIS — E46 Unspecified protein-calorie malnutrition: Secondary | ICD-10-CM | POA: Diagnosis not present

## 2016-03-08 DIAGNOSIS — J449 Chronic obstructive pulmonary disease, unspecified: Secondary | ICD-10-CM | POA: Diagnosis not present

## 2016-03-08 DIAGNOSIS — I2510 Atherosclerotic heart disease of native coronary artery without angina pectoris: Secondary | ICD-10-CM | POA: Diagnosis not present

## 2016-03-08 DIAGNOSIS — M069 Rheumatoid arthritis, unspecified: Secondary | ICD-10-CM | POA: Diagnosis not present

## 2016-03-08 DIAGNOSIS — E1159 Type 2 diabetes mellitus with other circulatory complications: Secondary | ICD-10-CM | POA: Diagnosis not present

## 2016-03-08 DIAGNOSIS — F339 Major depressive disorder, recurrent, unspecified: Secondary | ICD-10-CM | POA: Diagnosis not present

## 2016-03-09 DIAGNOSIS — I2510 Atherosclerotic heart disease of native coronary artery without angina pectoris: Secondary | ICD-10-CM | POA: Diagnosis not present

## 2016-03-09 DIAGNOSIS — F339 Major depressive disorder, recurrent, unspecified: Secondary | ICD-10-CM | POA: Diagnosis not present

## 2016-03-09 DIAGNOSIS — E46 Unspecified protein-calorie malnutrition: Secondary | ICD-10-CM | POA: Diagnosis not present

## 2016-03-09 DIAGNOSIS — J449 Chronic obstructive pulmonary disease, unspecified: Secondary | ICD-10-CM | POA: Diagnosis not present

## 2016-03-09 DIAGNOSIS — M069 Rheumatoid arthritis, unspecified: Secondary | ICD-10-CM | POA: Diagnosis not present

## 2016-03-09 DIAGNOSIS — E1159 Type 2 diabetes mellitus with other circulatory complications: Secondary | ICD-10-CM | POA: Diagnosis not present

## 2016-03-10 DIAGNOSIS — F339 Major depressive disorder, recurrent, unspecified: Secondary | ICD-10-CM | POA: Diagnosis not present

## 2016-03-10 DIAGNOSIS — I2510 Atherosclerotic heart disease of native coronary artery without angina pectoris: Secondary | ICD-10-CM | POA: Diagnosis not present

## 2016-03-10 DIAGNOSIS — E1159 Type 2 diabetes mellitus with other circulatory complications: Secondary | ICD-10-CM | POA: Diagnosis not present

## 2016-03-10 DIAGNOSIS — J449 Chronic obstructive pulmonary disease, unspecified: Secondary | ICD-10-CM | POA: Diagnosis not present

## 2016-03-10 DIAGNOSIS — E46 Unspecified protein-calorie malnutrition: Secondary | ICD-10-CM | POA: Diagnosis not present

## 2016-03-10 DIAGNOSIS — M069 Rheumatoid arthritis, unspecified: Secondary | ICD-10-CM | POA: Diagnosis not present

## 2016-03-11 DIAGNOSIS — E46 Unspecified protein-calorie malnutrition: Secondary | ICD-10-CM | POA: Diagnosis not present

## 2016-03-11 DIAGNOSIS — J449 Chronic obstructive pulmonary disease, unspecified: Secondary | ICD-10-CM | POA: Diagnosis not present

## 2016-03-11 DIAGNOSIS — I2510 Atherosclerotic heart disease of native coronary artery without angina pectoris: Secondary | ICD-10-CM | POA: Diagnosis not present

## 2016-03-11 DIAGNOSIS — E1159 Type 2 diabetes mellitus with other circulatory complications: Secondary | ICD-10-CM | POA: Diagnosis not present

## 2016-03-11 DIAGNOSIS — F339 Major depressive disorder, recurrent, unspecified: Secondary | ICD-10-CM | POA: Diagnosis not present

## 2016-03-11 DIAGNOSIS — M069 Rheumatoid arthritis, unspecified: Secondary | ICD-10-CM | POA: Diagnosis not present

## 2016-03-13 DIAGNOSIS — M069 Rheumatoid arthritis, unspecified: Secondary | ICD-10-CM | POA: Diagnosis not present

## 2016-03-13 DIAGNOSIS — E1159 Type 2 diabetes mellitus with other circulatory complications: Secondary | ICD-10-CM | POA: Diagnosis not present

## 2016-03-13 DIAGNOSIS — F339 Major depressive disorder, recurrent, unspecified: Secondary | ICD-10-CM | POA: Diagnosis not present

## 2016-03-13 DIAGNOSIS — E46 Unspecified protein-calorie malnutrition: Secondary | ICD-10-CM | POA: Diagnosis not present

## 2016-03-13 DIAGNOSIS — J449 Chronic obstructive pulmonary disease, unspecified: Secondary | ICD-10-CM | POA: Diagnosis not present

## 2016-03-13 DIAGNOSIS — I2510 Atherosclerotic heart disease of native coronary artery without angina pectoris: Secondary | ICD-10-CM | POA: Diagnosis not present

## 2016-03-15 DIAGNOSIS — M069 Rheumatoid arthritis, unspecified: Secondary | ICD-10-CM | POA: Diagnosis not present

## 2016-03-15 DIAGNOSIS — F339 Major depressive disorder, recurrent, unspecified: Secondary | ICD-10-CM | POA: Diagnosis not present

## 2016-03-15 DIAGNOSIS — E46 Unspecified protein-calorie malnutrition: Secondary | ICD-10-CM | POA: Diagnosis not present

## 2016-03-15 DIAGNOSIS — J449 Chronic obstructive pulmonary disease, unspecified: Secondary | ICD-10-CM | POA: Diagnosis not present

## 2016-03-15 DIAGNOSIS — I2510 Atherosclerotic heart disease of native coronary artery without angina pectoris: Secondary | ICD-10-CM | POA: Diagnosis not present

## 2016-03-15 DIAGNOSIS — E1159 Type 2 diabetes mellitus with other circulatory complications: Secondary | ICD-10-CM | POA: Diagnosis not present

## 2016-03-16 DIAGNOSIS — E1159 Type 2 diabetes mellitus with other circulatory complications: Secondary | ICD-10-CM | POA: Diagnosis not present

## 2016-03-16 DIAGNOSIS — F339 Major depressive disorder, recurrent, unspecified: Secondary | ICD-10-CM | POA: Diagnosis not present

## 2016-03-16 DIAGNOSIS — I2510 Atherosclerotic heart disease of native coronary artery without angina pectoris: Secondary | ICD-10-CM | POA: Diagnosis not present

## 2016-03-16 DIAGNOSIS — J449 Chronic obstructive pulmonary disease, unspecified: Secondary | ICD-10-CM | POA: Diagnosis not present

## 2016-03-16 DIAGNOSIS — E46 Unspecified protein-calorie malnutrition: Secondary | ICD-10-CM | POA: Diagnosis not present

## 2016-03-16 DIAGNOSIS — M069 Rheumatoid arthritis, unspecified: Secondary | ICD-10-CM | POA: Diagnosis not present

## 2016-03-17 DIAGNOSIS — F339 Major depressive disorder, recurrent, unspecified: Secondary | ICD-10-CM | POA: Diagnosis not present

## 2016-03-17 DIAGNOSIS — I2510 Atherosclerotic heart disease of native coronary artery without angina pectoris: Secondary | ICD-10-CM | POA: Diagnosis not present

## 2016-03-17 DIAGNOSIS — J449 Chronic obstructive pulmonary disease, unspecified: Secondary | ICD-10-CM | POA: Diagnosis not present

## 2016-03-17 DIAGNOSIS — E46 Unspecified protein-calorie malnutrition: Secondary | ICD-10-CM | POA: Diagnosis not present

## 2016-03-17 DIAGNOSIS — E1159 Type 2 diabetes mellitus with other circulatory complications: Secondary | ICD-10-CM | POA: Diagnosis not present

## 2016-03-17 DIAGNOSIS — M069 Rheumatoid arthritis, unspecified: Secondary | ICD-10-CM | POA: Diagnosis not present

## 2016-03-20 DIAGNOSIS — I2510 Atherosclerotic heart disease of native coronary artery without angina pectoris: Secondary | ICD-10-CM | POA: Diagnosis not present

## 2016-03-20 DIAGNOSIS — J449 Chronic obstructive pulmonary disease, unspecified: Secondary | ICD-10-CM | POA: Diagnosis not present

## 2016-03-20 DIAGNOSIS — F339 Major depressive disorder, recurrent, unspecified: Secondary | ICD-10-CM | POA: Diagnosis not present

## 2016-03-20 DIAGNOSIS — M069 Rheumatoid arthritis, unspecified: Secondary | ICD-10-CM | POA: Diagnosis not present

## 2016-03-20 DIAGNOSIS — E1159 Type 2 diabetes mellitus with other circulatory complications: Secondary | ICD-10-CM | POA: Diagnosis not present

## 2016-03-20 DIAGNOSIS — E46 Unspecified protein-calorie malnutrition: Secondary | ICD-10-CM | POA: Diagnosis not present

## 2016-03-21 DIAGNOSIS — J449 Chronic obstructive pulmonary disease, unspecified: Secondary | ICD-10-CM | POA: Diagnosis not present

## 2016-03-21 DIAGNOSIS — E46 Unspecified protein-calorie malnutrition: Secondary | ICD-10-CM | POA: Diagnosis not present

## 2016-03-21 DIAGNOSIS — I2510 Atherosclerotic heart disease of native coronary artery without angina pectoris: Secondary | ICD-10-CM | POA: Diagnosis not present

## 2016-03-21 DIAGNOSIS — M069 Rheumatoid arthritis, unspecified: Secondary | ICD-10-CM | POA: Diagnosis not present

## 2016-03-21 DIAGNOSIS — F339 Major depressive disorder, recurrent, unspecified: Secondary | ICD-10-CM | POA: Diagnosis not present

## 2016-03-21 DIAGNOSIS — E1159 Type 2 diabetes mellitus with other circulatory complications: Secondary | ICD-10-CM | POA: Diagnosis not present

## 2016-03-22 DIAGNOSIS — I2510 Atherosclerotic heart disease of native coronary artery without angina pectoris: Secondary | ICD-10-CM | POA: Diagnosis not present

## 2016-03-22 DIAGNOSIS — F339 Major depressive disorder, recurrent, unspecified: Secondary | ICD-10-CM | POA: Diagnosis not present

## 2016-03-22 DIAGNOSIS — J449 Chronic obstructive pulmonary disease, unspecified: Secondary | ICD-10-CM | POA: Diagnosis not present

## 2016-03-22 DIAGNOSIS — E46 Unspecified protein-calorie malnutrition: Secondary | ICD-10-CM | POA: Diagnosis not present

## 2016-03-22 DIAGNOSIS — M069 Rheumatoid arthritis, unspecified: Secondary | ICD-10-CM | POA: Diagnosis not present

## 2016-03-22 DIAGNOSIS — E1159 Type 2 diabetes mellitus with other circulatory complications: Secondary | ICD-10-CM | POA: Diagnosis not present

## 2016-03-23 DIAGNOSIS — E46 Unspecified protein-calorie malnutrition: Secondary | ICD-10-CM | POA: Diagnosis not present

## 2016-03-23 DIAGNOSIS — I2510 Atherosclerotic heart disease of native coronary artery without angina pectoris: Secondary | ICD-10-CM | POA: Diagnosis not present

## 2016-03-23 DIAGNOSIS — J449 Chronic obstructive pulmonary disease, unspecified: Secondary | ICD-10-CM | POA: Diagnosis not present

## 2016-03-23 DIAGNOSIS — K219 Gastro-esophageal reflux disease without esophagitis: Secondary | ICD-10-CM | POA: Diagnosis not present

## 2016-03-23 DIAGNOSIS — E1159 Type 2 diabetes mellitus with other circulatory complications: Secondary | ICD-10-CM | POA: Diagnosis not present

## 2016-03-23 DIAGNOSIS — F339 Major depressive disorder, recurrent, unspecified: Secondary | ICD-10-CM | POA: Diagnosis not present

## 2016-03-23 DIAGNOSIS — S322XXS Fracture of coccyx, sequela: Secondary | ICD-10-CM | POA: Diagnosis not present

## 2016-03-23 DIAGNOSIS — M069 Rheumatoid arthritis, unspecified: Secondary | ICD-10-CM | POA: Diagnosis not present

## 2016-03-24 DIAGNOSIS — E46 Unspecified protein-calorie malnutrition: Secondary | ICD-10-CM | POA: Diagnosis not present

## 2016-03-24 DIAGNOSIS — F339 Major depressive disorder, recurrent, unspecified: Secondary | ICD-10-CM | POA: Diagnosis not present

## 2016-03-24 DIAGNOSIS — I2510 Atherosclerotic heart disease of native coronary artery without angina pectoris: Secondary | ICD-10-CM | POA: Diagnosis not present

## 2016-03-24 DIAGNOSIS — J449 Chronic obstructive pulmonary disease, unspecified: Secondary | ICD-10-CM | POA: Diagnosis not present

## 2016-03-24 DIAGNOSIS — E1159 Type 2 diabetes mellitus with other circulatory complications: Secondary | ICD-10-CM | POA: Diagnosis not present

## 2016-03-24 DIAGNOSIS — M069 Rheumatoid arthritis, unspecified: Secondary | ICD-10-CM | POA: Diagnosis not present

## 2016-03-27 DIAGNOSIS — F339 Major depressive disorder, recurrent, unspecified: Secondary | ICD-10-CM | POA: Diagnosis not present

## 2016-03-27 DIAGNOSIS — M069 Rheumatoid arthritis, unspecified: Secondary | ICD-10-CM | POA: Diagnosis not present

## 2016-03-27 DIAGNOSIS — E46 Unspecified protein-calorie malnutrition: Secondary | ICD-10-CM | POA: Diagnosis not present

## 2016-03-27 DIAGNOSIS — I2510 Atherosclerotic heart disease of native coronary artery without angina pectoris: Secondary | ICD-10-CM | POA: Diagnosis not present

## 2016-03-27 DIAGNOSIS — J449 Chronic obstructive pulmonary disease, unspecified: Secondary | ICD-10-CM | POA: Diagnosis not present

## 2016-03-27 DIAGNOSIS — E1159 Type 2 diabetes mellitus with other circulatory complications: Secondary | ICD-10-CM | POA: Diagnosis not present

## 2016-03-29 DIAGNOSIS — E46 Unspecified protein-calorie malnutrition: Secondary | ICD-10-CM | POA: Diagnosis not present

## 2016-03-29 DIAGNOSIS — F339 Major depressive disorder, recurrent, unspecified: Secondary | ICD-10-CM | POA: Diagnosis not present

## 2016-03-29 DIAGNOSIS — I2510 Atherosclerotic heart disease of native coronary artery without angina pectoris: Secondary | ICD-10-CM | POA: Diagnosis not present

## 2016-03-29 DIAGNOSIS — J449 Chronic obstructive pulmonary disease, unspecified: Secondary | ICD-10-CM | POA: Diagnosis not present

## 2016-03-29 DIAGNOSIS — E1159 Type 2 diabetes mellitus with other circulatory complications: Secondary | ICD-10-CM | POA: Diagnosis not present

## 2016-03-29 DIAGNOSIS — M069 Rheumatoid arthritis, unspecified: Secondary | ICD-10-CM | POA: Diagnosis not present

## 2016-03-30 DIAGNOSIS — J449 Chronic obstructive pulmonary disease, unspecified: Secondary | ICD-10-CM | POA: Diagnosis not present

## 2016-03-30 DIAGNOSIS — M069 Rheumatoid arthritis, unspecified: Secondary | ICD-10-CM | POA: Diagnosis not present

## 2016-03-30 DIAGNOSIS — E46 Unspecified protein-calorie malnutrition: Secondary | ICD-10-CM | POA: Diagnosis not present

## 2016-03-30 DIAGNOSIS — F339 Major depressive disorder, recurrent, unspecified: Secondary | ICD-10-CM | POA: Diagnosis not present

## 2016-03-30 DIAGNOSIS — I2510 Atherosclerotic heart disease of native coronary artery without angina pectoris: Secondary | ICD-10-CM | POA: Diagnosis not present

## 2016-03-30 DIAGNOSIS — E1159 Type 2 diabetes mellitus with other circulatory complications: Secondary | ICD-10-CM | POA: Diagnosis not present

## 2016-03-31 DIAGNOSIS — E46 Unspecified protein-calorie malnutrition: Secondary | ICD-10-CM | POA: Diagnosis not present

## 2016-03-31 DIAGNOSIS — E1159 Type 2 diabetes mellitus with other circulatory complications: Secondary | ICD-10-CM | POA: Diagnosis not present

## 2016-03-31 DIAGNOSIS — M069 Rheumatoid arthritis, unspecified: Secondary | ICD-10-CM | POA: Diagnosis not present

## 2016-03-31 DIAGNOSIS — F339 Major depressive disorder, recurrent, unspecified: Secondary | ICD-10-CM | POA: Diagnosis not present

## 2016-03-31 DIAGNOSIS — J449 Chronic obstructive pulmonary disease, unspecified: Secondary | ICD-10-CM | POA: Diagnosis not present

## 2016-03-31 DIAGNOSIS — I2510 Atherosclerotic heart disease of native coronary artery without angina pectoris: Secondary | ICD-10-CM | POA: Diagnosis not present

## 2016-04-20 DEATH — deceased
